# Patient Record
Sex: Female | Born: 1935 | Race: White | Hispanic: No | Smoking: Former smoker
Health system: Southern US, Community
[De-identification: ages and names within clinical notes are randomized; demographics above are authoritative.]

## PROBLEM LIST (undated history)

## (undated) DIAGNOSIS — M1990 Unspecified osteoarthritis, unspecified site: Secondary | ICD-10-CM

## (undated) DIAGNOSIS — G5600 Carpal tunnel syndrome, unspecified upper limb: Secondary | ICD-10-CM

## (undated) DIAGNOSIS — E785 Hyperlipidemia, unspecified: Secondary | ICD-10-CM

## (undated) DIAGNOSIS — J449 Chronic obstructive pulmonary disease, unspecified: Secondary | ICD-10-CM

## (undated) DIAGNOSIS — K219 Gastro-esophageal reflux disease without esophagitis: Secondary | ICD-10-CM

## (undated) DIAGNOSIS — I10 Essential (primary) hypertension: Secondary | ICD-10-CM

## (undated) HISTORY — DX: Hyperlipidemia, unspecified: E78.5

## (undated) HISTORY — DX: Chronic obstructive pulmonary disease, unspecified: J44.9

## (undated) HISTORY — PX: KNEE ARTHROSCOPY: SUR90

## (undated) HISTORY — DX: Essential (primary) hypertension: I10

## (undated) HISTORY — PX: OTHER SURGICAL HISTORY: SHX169

## (undated) HISTORY — DX: Carpal tunnel syndrome, unspecified upper limb: G56.00

## (undated) HISTORY — PX: TONSILLECTOMY: SUR1361

## (undated) HISTORY — DX: Gastro-esophageal reflux disease without esophagitis: K21.9

## (undated) HISTORY — DX: Unspecified osteoarthritis, unspecified site: M19.90

---

## 1997-12-12 ENCOUNTER — Emergency Department (HOSPITAL_COMMUNITY): Admission: RE | Admit: 1997-12-12 | Discharge: 1997-12-12 | Payer: Self-pay | Admitting: Internal Medicine

## 1998-01-19 ENCOUNTER — Encounter: Admission: RE | Admit: 1998-01-19 | Discharge: 1998-04-19 | Payer: Self-pay | Admitting: Orthopedic Surgery

## 1999-08-20 ENCOUNTER — Other Ambulatory Visit: Admission: RE | Admit: 1999-08-20 | Discharge: 1999-09-14 | Payer: Self-pay | Admitting: Internal Medicine

## 1999-09-02 ENCOUNTER — Encounter: Payer: Self-pay | Admitting: Internal Medicine

## 1999-09-02 ENCOUNTER — Encounter: Admission: RE | Admit: 1999-09-02 | Discharge: 1999-09-02 | Payer: Self-pay | Admitting: Internal Medicine

## 2002-08-15 ENCOUNTER — Other Ambulatory Visit: Admission: RE | Admit: 2002-08-15 | Discharge: 2002-08-15 | Payer: Self-pay | Admitting: Internal Medicine

## 2002-11-01 ENCOUNTER — Encounter: Payer: Self-pay | Admitting: Emergency Medicine

## 2002-11-01 ENCOUNTER — Emergency Department (HOSPITAL_COMMUNITY): Admission: EM | Admit: 2002-11-01 | Discharge: 2002-11-01 | Payer: Self-pay | Admitting: Emergency Medicine

## 2003-01-03 ENCOUNTER — Encounter: Admission: RE | Admit: 2003-01-03 | Discharge: 2003-01-03 | Payer: Self-pay | Admitting: Orthopaedic Surgery

## 2003-01-03 ENCOUNTER — Encounter: Payer: Self-pay | Admitting: Orthopaedic Surgery

## 2003-01-17 ENCOUNTER — Encounter: Payer: Self-pay | Admitting: Orthopaedic Surgery

## 2003-01-17 ENCOUNTER — Encounter: Admission: RE | Admit: 2003-01-17 | Discharge: 2003-01-17 | Payer: Self-pay | Admitting: Orthopaedic Surgery

## 2003-02-06 ENCOUNTER — Encounter: Payer: Self-pay | Admitting: Radiology

## 2003-02-06 ENCOUNTER — Encounter: Admission: RE | Admit: 2003-02-06 | Discharge: 2003-02-06 | Payer: Self-pay | Admitting: Orthopaedic Surgery

## 2003-02-06 ENCOUNTER — Encounter: Payer: Self-pay | Admitting: Orthopaedic Surgery

## 2003-07-08 ENCOUNTER — Encounter: Payer: Self-pay | Admitting: Neurosurgery

## 2003-07-10 ENCOUNTER — Inpatient Hospital Stay (HOSPITAL_COMMUNITY): Admission: RE | Admit: 2003-07-10 | Discharge: 2003-07-11 | Payer: Self-pay | Admitting: Neurosurgery

## 2003-07-10 ENCOUNTER — Encounter: Payer: Self-pay | Admitting: Neurosurgery

## 2003-07-23 ENCOUNTER — Encounter: Admission: RE | Admit: 2003-07-23 | Discharge: 2003-08-21 | Payer: Self-pay | Admitting: Obstetrics and Gynecology

## 2003-09-01 ENCOUNTER — Encounter: Admission: RE | Admit: 2003-09-01 | Discharge: 2003-11-30 | Payer: Self-pay | Admitting: Neurosurgery

## 2004-02-13 ENCOUNTER — Ambulatory Visit (HOSPITAL_COMMUNITY): Admission: RE | Admit: 2004-02-13 | Discharge: 2004-02-13 | Payer: Self-pay | Admitting: Neurosurgery

## 2004-03-30 ENCOUNTER — Ambulatory Visit (HOSPITAL_BASED_OUTPATIENT_CLINIC_OR_DEPARTMENT_OTHER): Admission: RE | Admit: 2004-03-30 | Discharge: 2004-03-30 | Payer: Self-pay | Admitting: Orthopaedic Surgery

## 2005-11-20 ENCOUNTER — Emergency Department (HOSPITAL_COMMUNITY): Admission: EM | Admit: 2005-11-20 | Discharge: 2005-11-20 | Payer: Self-pay | Admitting: Emergency Medicine

## 2008-08-04 ENCOUNTER — Ambulatory Visit: Payer: Self-pay | Admitting: Surgery

## 2008-09-22 ENCOUNTER — Ambulatory Visit: Payer: Self-pay | Admitting: Surgery

## 2008-12-15 ENCOUNTER — Ambulatory Visit: Payer: Self-pay | Admitting: Surgery

## 2009-02-03 ENCOUNTER — Encounter (INDEPENDENT_AMBULATORY_CARE_PROVIDER_SITE_OTHER): Payer: Self-pay | Admitting: *Deleted

## 2009-02-03 ENCOUNTER — Ambulatory Visit (HOSPITAL_COMMUNITY): Admission: RE | Admit: 2009-02-03 | Discharge: 2009-02-03 | Payer: Self-pay | Admitting: *Deleted

## 2009-06-08 ENCOUNTER — Ambulatory Visit: Payer: Self-pay | Admitting: Surgery

## 2009-10-30 ENCOUNTER — Inpatient Hospital Stay (HOSPITAL_COMMUNITY): Admission: EM | Admit: 2009-10-30 | Discharge: 2009-11-05 | Payer: Self-pay | Admitting: Emergency Medicine

## 2009-11-02 ENCOUNTER — Ambulatory Visit: Payer: Self-pay | Admitting: Physical Medicine & Rehabilitation

## 2009-11-05 ENCOUNTER — Inpatient Hospital Stay (HOSPITAL_COMMUNITY)
Admission: RE | Admit: 2009-11-05 | Discharge: 2009-11-14 | Payer: Self-pay | Admitting: Physical Medicine & Rehabilitation

## 2009-11-06 ENCOUNTER — Ambulatory Visit: Payer: Self-pay | Admitting: Physical Medicine & Rehabilitation

## 2010-06-14 ENCOUNTER — Ambulatory Visit: Payer: Self-pay | Admitting: Surgery

## 2010-07-20 ENCOUNTER — Ambulatory Visit (HOSPITAL_COMMUNITY): Admission: RE | Admit: 2010-07-20 | Discharge: 2010-07-20 | Payer: Self-pay | Admitting: Internal Medicine

## 2010-11-22 ENCOUNTER — Ambulatory Visit (HOSPITAL_COMMUNITY): Payer: Self-pay | Admitting: Speech Pathology

## 2011-01-02 LAB — CBC
HCT: 32.1 % — ABNORMAL LOW (ref 36.0–46.0)
HCT: 32.2 % — ABNORMAL LOW (ref 36.0–46.0)
Hemoglobin: 10.9 g/dL — ABNORMAL LOW (ref 12.0–15.0)
Hemoglobin: 12.3 g/dL (ref 12.0–15.0)
MCHC: 33.3 g/dL (ref 30.0–36.0)
MCHC: 33.6 g/dL (ref 30.0–36.0)
MCHC: 34.1 g/dL (ref 30.0–36.0)
MCHC: 34.3 g/dL (ref 30.0–36.0)
MCV: 94.1 fL (ref 78.0–100.0)
MCV: 94.4 fL (ref 78.0–100.0)
MCV: 94.1 fL (ref 78.0–100.0)
MCV: 94.4 fL (ref 78.0–100.0)
Platelets: 188 10*3/uL (ref 150–400)
Platelets: 279 10*3/uL (ref 150–400)
RBC: 3.41 MIL/uL — ABNORMAL LOW (ref 3.87–5.11)
RBC: 3.21 MIL/uL — ABNORMAL LOW (ref 3.87–5.11)
RBC: 3.42 MIL/uL — ABNORMAL LOW (ref 3.87–5.11)
RDW: 13.7 % (ref 11.5–15.5)
RDW: 13.7 % (ref 11.5–15.5)
RDW: 13.8 % (ref 11.5–15.5)
RDW: 14.2 % (ref 11.5–15.5)
WBC: 4.5 10*3/uL (ref 4.0–10.5)
WBC: 13.4 10*3/uL — ABNORMAL HIGH (ref 4.0–10.5)

## 2011-01-02 LAB — COMPREHENSIVE METABOLIC PANEL
AST: 16 U/L (ref 0–37)
Albumin: 2.6 g/dL — ABNORMAL LOW (ref 3.5–5.2)
Chloride: 101 mEq/L (ref 96–112)
Creatinine, Ser: 1.09 mg/dL (ref 0.4–1.2)
GFR calc Af Amer: 59 mL/min — ABNORMAL LOW (ref >60)
Potassium: 3.9 mEq/L (ref 3.5–5.1)
Total Bilirubin: 0.4 mg/dL (ref 0.3–1.2)
Total Protein: 5.4 g/dL — ABNORMAL LOW (ref 6.0–8.3)

## 2011-01-02 LAB — URINALYSIS, ROUTINE W REFLEX MICROSCOPIC
Bilirubin Urine: NEGATIVE
Bilirubin Urine: NEGATIVE
Glucose, UA: NEGATIVE mg/dL
Hgb urine dipstick: NEGATIVE
Ketones, ur: NEGATIVE mg/dL
Nitrite: NEGATIVE
Nitrite: POSITIVE — AB
Specific Gravity, Urine: 1.015 (ref 1.005–1.030)
Specific Gravity, Urine: 1.014 (ref 1.005–1.030)
Urobilinogen, UA: 1 mg/dL (ref 0.0–1.0)
pH: 6 (ref 5.0–8.0)

## 2011-01-02 LAB — BASIC METABOLIC PANEL
BUN: 17 mg/dL (ref 6–23)
BUN: 23 mg/dL (ref 6–23)
CO2: 27 mEq/L (ref 19–32)
CO2: 29 mEq/L (ref 19–32)
CO2: 23 mEq/L (ref 19–32)
CO2: 26 mEq/L (ref 19–32)
CO2: 27 mEq/L (ref 19–32)
Calcium: 8.2 mg/dL — ABNORMAL LOW (ref 8.4–10.5)
Calcium: 8.1 mg/dL — ABNORMAL LOW (ref 8.4–10.5)
Calcium: 8.7 mg/dL (ref 8.4–10.5)
Calcium: 8.5 mg/dL (ref 8.4–10.5)
Chloride: 100 mEq/L (ref 96–112)
Chloride: 103 mEq/L (ref 96–112)
Chloride: 104 mEq/L (ref 96–112)
Creatinine, Ser: 0.72 mg/dL (ref 0.4–1.2)
Creatinine, Ser: 0.94 mg/dL (ref 0.4–1.2)
Creatinine, Ser: 1.06 mg/dL (ref 0.4–1.2)
Creatinine, Ser: 1.11 mg/dL (ref 0.4–1.2)
GFR calc Af Amer: >60 mL/min (ref >60)
GFR calc Af Amer: 57 mL/min — ABNORMAL LOW (ref >60)
GFR calc Af Amer: >60 mL/min (ref >60)
GFR calc non Af Amer: 50 mL/min — ABNORMAL LOW (ref >60)
Glucose, Bld: 134 mg/dL — ABNORMAL HIGH (ref 70–99)
Glucose, Bld: 88 mg/dL (ref 70–99)
Glucose, Bld: 95 mg/dL (ref 70–99)
Glucose, Bld: 111 mg/dL — ABNORMAL HIGH (ref 70–99)
Potassium: 4.4 mEq/L (ref 3.5–5.1)
Sodium: 135 mEq/L (ref 135–145)
Sodium: 135 mEq/L (ref 135–145)
Sodium: 136 mEq/L (ref 135–145)

## 2011-01-02 LAB — CARDIAC PANEL(CRET KIN+CKTOT+MB+TROPI)
CK, MB: 1.9 ng/mL (ref 0.3–4.0)
CK, MB: 2.6 ng/mL (ref 0.3–4.0)
Relative Index: INVALID (ref 0.0–2.5)
Relative Index: INVALID (ref 0.0–2.5)
Total CK: 33 U/L (ref 7–177)
Total CK: 46 U/L (ref 7–177)

## 2011-01-02 LAB — DIFFERENTIAL
Basophils Absolute: 0 10*3/uL (ref 0.0–0.1)
Basophils Absolute: 0 10*3/uL (ref 0.0–0.1)
Basophils Relative: 1 % (ref 0–1)
Eosinophils Relative: 1 % (ref 0–5)
Lymphocytes Relative: 19 % (ref 12–46)
Monocytes Absolute: 0.7 10*3/uL (ref 0.1–1.0)
Monocytes Absolute: 1.5 10*3/uL — ABNORMAL HIGH (ref 0.1–1.0)
Monocytes Relative: 11 % (ref 3–12)
Neutro Abs: 3 10*3/uL (ref 1.7–7.7)
Neutro Abs: 9.1 10*3/uL — ABNORMAL HIGH (ref 1.7–7.7)
Neutrophils Relative %: 57 % (ref 43–77)

## 2011-01-02 LAB — URINE CULTURE: Colony Count: >=100000

## 2011-01-02 LAB — URINE MICROSCOPIC-ADD ON

## 2011-01-02 LAB — TSH: TSH: 0.552 u[IU]/mL (ref 0.350–4.500)

## 2011-03-01 NOTE — Assessment & Plan Note (Signed)
OFFICE VISIT   Carrie, Pena  DOB:  03-May-1928                                       06/08/2009  EAVWU#:[id number]   HISTORY:  This is a 77 year old female who I initially saw at the  request of  Dr. Aleen Campi for bilateral lower extremity  claudication. By  angiogram, she had multisegment disease.  She had an ankle brachial  index of 0.53 on the right 0.59 on the left and 0.59 on the left.  We  elected to treat her medically.  Her most recent ankle brachial index in  our office was in  March of 2010 revealing an ABI of 0.58 on the right  and 0.71 on the left.  We  had started her on cilostazol for her leg  pain.  Today she states that she is not having any issues with her legs  other than swelling.  They do not hurt her.  Unfortunately, due to her  physical limitations, she is probably just does not walking far enough  to where she has problems.  She does not have any ulcerations.  Again  her biggest complaint is leg swelling which has been a chronic problem.   PHYSICAL EXAMINATION:  Blood pressure 162/72, pulse 83.  She is well-  appearing in no distress.  She has 2+ pitting edema up to the knee.  Pedal pulses are difficult to palpate.  She has no ulceration.   ASSESSMENT/PLAN:  Bilateral peripheral vascular disease.   PLAN:  I think at this point, the patient does not require additional  treatment.  I would continue her cilostazol.  She is not walking far not  to where she suffers from arterial insufficiency,   With regard to the swelling.  I told that she may benefit from leg  compression stockings.  She is also going to look into as to whether she  would benefit from further cardiac evaluation and/or adding a diuretic.  She is supposed to  see Dr. Ronne Binning  this Monday, and she will address  that at this time.   Jorge Ny, MD  Electronically Signed   VWB/MEDQ  D:  06/08/2009  T:  06/09/2009  Job:  [id number]   cc:   Dr. Ronne Binning

## 2011-03-01 NOTE — Assessment & Plan Note (Signed)
OFFICE VISIT   Carrie, Pena  DOB:  1927/12/11                                       12/15/2008  ZOXWR#:[id number]   REASON FOR VISIT:  Followup.   HISTORY:  This is a 77 year old female who I am seeing at the request of  Dr. Aleen Campi for bilateral lower extremity claudication.  By recent  arteriogram by Dr. Aleen Campi she had multi level disease.  Her ankle  brachial index was 0.53 on the right and 0.59 on the left.  After an  extensive conversation with the patient I felt like her symptoms were  more related to orthopedic issues, specifically she has problems with  getting up from a standing position and not cramping with walking.  She  has seen Dr. Jerl Santos for evaluation of her knees and she underwent  steroid injections.  She has a history of bilateral knee replacements.   She comes back in today for followup.  I have placed her on cilostazol.  Subjectively she feels that she is doing a little better.  Her symptoms  have not changed.  She does not endorse cramping with walking but rather  her biggest complaints are pain when she tries to stand up and pain with  trying to get her left leg into the bed.  She is able to walk without  cramps.  She is limited by overall fatigue and her walking.   PHYSICAL EXAMINATION:  Her blood pressure is 188/93, pulse is 99.  In  general she is well-appearing, in no distress.  Her extremities are warm  and well-perfused.  Pedal pulses not palpable.   ASSESSMENT:  Peripheral vascular disease and leg pain.   PLAN:  Again, I am still not convinced that the patient's leg complaints  are due to arterial insufficiency.  They seem to be more arthritic in  nature as her biggest complaints are when she goes from a resting  position to a mobile position.  She does not endorse any signs of  claudication.  Her walking is really limited by overall body fatigue not  by leg cramps.  She does not endorse rest pain either.  For  these  reasons I elected to continue to follow the patient with expectant  management.  Obviously if her symptoms change or if she develops a  nonhealing ulcer or anything of that nature we would schedule her for  arteriogram and plan for intervention.  She is going to see me back in 6  months.   Jorge Ny, MD  Electronically Signed   VWB/MEDQ  D:  12/15/2008  T:  12/17/2008  Job:  [id number]   cc:   Antionette Char, MD  Thayer Headings, M.D.

## 2011-03-01 NOTE — Assessment & Plan Note (Signed)
OFFICE VISIT   Carrie Pena, Carrie Pena  DOB:  05-04-1928                                       09/22/2008  ZOXWR#:[id number]   REASON FOR VISIT:  Follow-up.   HISTORY:  This is a 77 year old female that I saw at the request of Dr.  Aleen Campi for evaluation of bilateral lower extremity claudication.  The  patient had recently undergone an arteriogram by Dr. Aleen Campi and had  multilevel disease.  She has known peripheral vascular disease with an  ABI of 0.53 on the right and 0.59 on the left.  I was not convinced her  symptoms were due to claudication and when I saw her last I put her on  Pletal.  She comes back in today for follow-up.  Again with specific  questioning of her symptoms, she states that her knees give out when she  starts to walk.  She does not ever experience cramping or pain in her  feet at night.  She feels that she has numbness in her left leg which  prevents her from even lifting her legs up.   PHYSICAL EXAMINATION:  Vital Signs:  Her blood pressure is 166/120,  pulse 86, respirations 18, temperature is 98.1.  General:  She is  comfortable, in no distress.  Extremities:  Warm and well-perfused.   ASSESSMENT/PLAN:  Peripheral vascular disease.   Plan:  Again, after the specific questioning of the patient's symptoms,  I do not think that which she complains about is related to her vascular  disease.  She does have extensive peripheral vascular disease; however,  this is not causing her symptoms.  It sounds like her knees buckle and  with her history of her orthopedic surgeries in the past and improvement  with injections she may be limited by this.  I am going to see her back  in 3 months to see how she is doing.  She plans on seeing Dr. Jerl Santos  for evaluation of her knees in the near future.  If there are any  questions, they can get in contact with me.   Jorge Ny, MD  Electronically Signed   VWB/MEDQ  D:  09/22/2008  T:   09/23/2008  Job:  [id number]   cc:   Antionette Char, MD  Thayer Headings, M.D.

## 2011-03-01 NOTE — Assessment & Plan Note (Signed)
OFFICE VISIT   Carrie Pena, Carrie Pena  DOB:  07-24-1928                                       06/14/2010  ZOXWR#:[id number]   The patient comes back in today for followup.  She continues to have  similar complaints when I saw last year.  Specifically, she does not  like the swelling that she is having in both her legs.  She is also  complaining of left knee pain.  Last time, we had recommended  compression stockings and she did not get that filled because she does  not think she can put them on.  I had also recommended that she get in  touch with her cardiologist, Dr. Aleen Campi, to evaluate her swelling and  see if it was from a cardiac perspective and this, per her report, had  not been done either.  Overall, however, she has been stable on her  chronic problems since I last saw her.   On physical examination, heart rate 79, blood pressure 189/76, O2 sats  are 99%.  GENERAL:  She is in no acute distress.  Respirations are  nonlabored.  Abdomen is soft.  Extremities reveal bilateral pitting  edema up to the knee.  No ulceration.  No rashes.   DIAGNOSTICS:  I have ordered and independently reviewed her ABIs:  0.82  on the left and 0.65 on the right.   ASSESSMENT/PLAN:  1. Leg pain.  The patient describes pain in her left knee which she      states she feels as if 2 bones are rubbing together.  She was seen      by Dr. Jerl Santos in the past.  I have recommended that she address      these issues with him.  These complaints do not seem consistent      with arterial insufficiency.  2. With regards to the patient's swelling, I have again recommended      compression therapy.  However, she is reluctant to try this because      she does not think she can get the stockings on.  I also want to      make sure that her edema is not due to underlying cardiac      insufficiency.  I do not have access to her cardiac records.  She      states that she is supposed to see Dr.  Thea Silversmith in the next month      and that she will have him address this.  We could also consider      increasing or adding a new dieretic help with her regimen.  If all      of these issues are under control, we could consider venous      ultrasound to rule out venous insufficiency.  However, I do not      feel like that is the clinical scenario at this time.  I will plan      on seeing her back in the office in 1 year.     Jorge Ny, MD  Electronically Signed   VWB/MEDQ  D:  06/14/2010  T:  06/15/2010  Job:  [id number]   cc:   Thayer Headings, M.D.

## 2011-03-01 NOTE — Assessment & Plan Note (Signed)
OFFICE VISIT   MISSOURI, LAPAGLIA  DOB:  01-08-1928                                       08/04/2008  ZOXWR#:[id number]   REASON FOR VISIT:  Claudication.   PRIMARY CARE PHYSICIAN:  Thayer Headings, MD.   HISTORY:  This is a 77 year old female I am seeing at the request of Dr.  Aleen Campi for evaluation of bilateral claudication.  The patient recently  had an arteriogram, which reveals significant bilateral disease.  The  patient states that she has pain in her legs with walking.  However,  upon specific questioning she does not really endorse claudication-like  symptoms.  She states that her walking is limited mainly by her lack of  balance.  She also describes pain in her thighs when she wakes up in the  morning that gets better with walking.  She states that she does have  pain in her knees, which limits her walking, as well as pain in her  ankles, which limits her walking.  She does not endorse having any form  of a cramp with walking and she does not wake up in the middle of the  night with pain in her feet.   REVIEW OF SYSTEMS:  Negative for fevers, chills, weight gain, weight  loss.  CARDIOVASCULAR:  Negative for chest pain or palpitations.  RESPIRATORY:  Negative for shortness of breath.  GI:  Negative.  GU:  Negative.  NEUROLOGIC:  No focal changes.  MUSCULOSKELETAL:  Positive for arthritis.  SKIN:  Negative.  PSYCH:  Negative.   PAST MEDICAL HISTORY:  Osteoarthritis, carpal tunnel syndrome,  hypertension, reflux disease, hyperlipidemia.   PAST SURGICAL HISTORY:  Tonsillectomy, bilateral knee arthroscopy, left  carpal tunnel release.   SOCIAL HISTORY:  She is widowed with 4 children.  Currently smokes half  pack a day.  Does not drink.   MEDICATIONS:  Vitamin D, vitamin B12, low-dose aspirin, simvastatin,  naproxen.   ALLERGIES:  Tylenol.   PHYSICAL EXAMINATION:  Her blood pressure is 200/114, she is in no acute  distress.  She is  well-appearing.  HEENT:  Normocephalic, atraumatic.  Pupils are equal.  Sclerae are  anicteric.  Cardiovascular:  Regular rate and rhythm, respirations are  nonlabored.  Abdomen:  Soft, nontender.  Extremities:  Warm and well-  perfused.  Pedal pulses are not palpable.  There are no ulcerations.  Skin:  Without rash.  Neuro:  Cranial nerves II-XII are grossly intact.  Psych:  She is alert and oriented x3.   DIAGNOSTIC STUDIES:  I have reviewed the arteriogram that was performed  by Dr. Aleen Campi.   ASSESSMENT/PLAN:  Peripheral vascular disease.   PLAN:  I had a long conversation with the patient and wife and I do not  feel at this time that her lower extremity symptoms are related to  arterial insufficiency.  I have given her a prescription for Pletal to  see if she does receive any benefit.  I have told her to really pay  attention to her symptoms so that she can probably relate them to me at  our next visit, as of right now I do not feel like they sound like  arterial insufficiency.  She does have an ulcer on the medial side of  her left ankle from trauma,.  She states that she has had these in the  past and they have all healed, we will continue to monitor this.  I am  going to have her follow up with me in 6 weeks to reassess her symptoms  and to see if the medication had any benefit.   Jorge Ny, MD  Electronically Signed   VWB/MEDQ  D:  08/04/2008  T:  08/05/2008  Job:  [id number]   cc:   Antionette Char, MD  Thayer Headings, M.D.

## 2011-03-01 NOTE — Op Note (Signed)
NAMEKRISTIN, [name] NO.:  [id number]   MEDICAL RECORD NO.:  [id number]          PATIENT TYPE:  AMB   LOCATION:  ENDO                         FACILITY:  [facility]   PHYSICIAN:  Georgiana Spinner, M.D.    DATE OF BIRTH:  [birth year]   DATE OF PROCEDURE:  02/03/2009  DATE OF DISCHARGE:                               OPERATIVE REPORT   PROCEDURE:  Colonoscopy.   INDICATIONS:  Colon cancer screening, constipation.   ANESTHESIA:  Fentanyl 40 mcg, Versed 3 mg.   DESCRIPTION OF PROCEDURE:  With the patient mildly sedated in the left  lateral decubitus position and with subsequently being rolled to her  back the Pentax videoscopic pediatric colonoscope was inserted in the  rectum, passed under direct vision with pressure applied to reach the  cecum identified by ileocecal valve and appendiceal orifice both of  which were photographed.  From this point the colonoscope was slowly  withdrawn taking circumferential views of colonic mucosa as we withdrew  all the way to the rectum stopping first in the cecum where a polyp was  seen, photographed and removed using hot biopsy forceps technique  setting of 20/150 blended current.  We next stopped at the hepatic  flexure where a second polyp was seen.  It too was photographed and it  was removed using hot snare technique with the same setting and was  retrieved by suctioning the polyp through the endoscope into a tissue  trap.  The endoscope was then withdrawn all the way to the rectum which  appeared normal on direct and showed hemorrhoids on retroflexed view.  The endoscope was straightened and withdrawn.  The patient's vital signs  and pulse oximeter remained stable.  The patient tolerated the procedure  well without apparent complication.   FINDINGS:  1. Polyp of cecum and hepatic flexure area.  2. There is some scattered diverticula.  3. Internal hemorrhoids.   PLAN:  Await biopsy report.  The patient will call me for results  and  follow-up with me as an outpatient.           ______________________________  Georgiana Spinner, M.D.     GMO/MEDQ  D:  02/03/2009  T:  02/03/2009  Job:  [id number]

## 2011-03-04 NOTE — Op Note (Signed)
NAMESHELLEY, COCKE                         ACCOUNT NO.:  [id number]   MEDICAL RECORD NO.:  [id number]                   PATIENT TYPE:  AMB   LOCATION:  DSC                                  FACILITY:  [facility]   PHYSICIAN:  Lubertha Basque. Jerl Santos, M.D.             DATE OF BIRTH:  Mar 05, 1928   DATE OF PROCEDURE:  03/30/2004  DATE OF DISCHARGE:                                 OPERATIVE REPORT   PREOPERATIVE DIAGNOSES:  1. Right knee degenerative joint disease.  2. Right knee torn medial meniscus.   POSTOPERATIVE DIAGNOSES:  1. Right knee degenerative joint disease.  2. Right knee torn lateral meniscus.   OPERATION PERFORMED:  1. Right knee removal of loose body and chondroplasty.  2. Right knee partial lateral meniscectomy.   SURGEON:  Lubertha Basque. Jerl Santos, M.D.   ASSISTANT:  Prince Rome, P.A.   ANESTHESIA:  General.   INDICATIONS FOR PROCEDURE:  The patient is a 77 year old woman with a long  history of right knee pain.  This has persisted despite various oral anti-  inflammatories and injectables.  Carrie Pena continues with pain at rest and pain  with activity which is limiting to Carrie Pena.  Carrie Pena is offered an arthroscopy.  Informed operative consent was obtained after discussion of possible  complications of reaction to anesthesia and infection.   DESCRIPTION OF PROCEDURE:  The patient was taken to the operating suite  where general anesthetic was applied without difficulty.  Carrie Pena was positioned  supine and prepped and draped in the normal sterile fashion.  After  administration of preop intravenous antibiotics, an arthroscopy of the right  knee was performed through two inferior portals.  Suprapatellar pouch was  benign while the patellofemoral joint exhibited some grade 2 chondromalacia  though this joint tracked well.  The medial compartment had no evidence of  meniscal or articular cartilage injury.  Carrie Pena did have some cartilaginous  loose bodies in this compartment which were  removed.  The largest measured  about 4 mm or 5 mm in diameter.  The anterior cruciate ligament appeared  intact.  The lateral compartment exhibited a degenerative tear of the middle  horn addressed with 10% partial lateral meniscectomy.  Carrie Pena had one small  dime-sized area of degeneration, grade 3 on the lateral tibial plateau which  I addressed with chondroplasty back to stable articular cartilage.  The knee  was irrigated at the end of the case and injected with the usual agents plus  Depo-Medrol.  Adaptic was placed over the portals followed by dry gauze and  a loose Ace wrap.  Estimated blood loss and intraoperative fluids can be obtained from  anesthesia records.   DISPOSITION:  The patient was extubated in the operating room and taken to  the recovery room in stable condition.  Plans were for the patient to go  home the same day and to follow up in the office in less than a  week.  I  will contact Carrie Pena by phone tonight.                                               Lubertha Basque Jerl Santos, M.D.    PGD/MEDQ  D:  03/30/2004  T:  03/30/2004  Job:  [id number]

## 2011-03-04 NOTE — Op Note (Signed)
NAME:  Carrie Pena, Carrie Pena                         ACCOUNT NO.:  [id number]   MEDICAL RECORD NO.:  [id number]                   PATIENT TYPE:  INP   LOCATION:  NA                                   FACILITY:  [facility]   PHYSICIAN:  Hewitt Shorts, M.D.            DATE OF BIRTH:  09/22/1928   DATE OF PROCEDURE:  07/10/2003  DATE OF DISCHARGE:                                 OPERATIVE REPORT   PREOPERATIVE DIAGNOSES:  Lumbar stenosis, lumbar spondylosis, lumbar  degenerative disk disease, and lumbar radiculopathy.   POSTOPERATIVE DIAGNOSES:  Lumbar stenosis, lumbar spondylosis, lumbar  degenerative disk disease, and lumbar radiculopathy.   PROCEDURE:  L3 to S1 decompressive lumbar laminectomy with microdissection.   SURGEON:  Hewitt Shorts, M.D.   ASSISTANT:  Danae Orleans. Venetia Maxon, M.D.   ANESTHESIA:  General endotracheal.   INDICATIONS:  The patient is a 77 year old woman who presented with  neurogenic claudication with particular pain running down through the right  lower extremity with weakness of her dorsiflexors and extensor hallucis  longus bilaterally, worse than the weakness of her plantar flexion  bilaterally.  MRI scan revealed severe lumbar stenosis at the L4-5 level,  marked spinal stenosis at the L5-S1 level, and moderate spinal stenosis at  the L3-4 level.  The decision was made to proceed with a multilevel  decompressive lumbar laminectomy.   DESCRIPTION OF PROCEDURE:  The patient was brought to the operating room and  placed under general endotracheal anesthesia.  The patient was turned to a  prone position and the lumbar region was prepped with Betadine soap and  solution and draped in a sterile fashion.  The midline was infiltrated with  local anesthetic with epinephrine.  An x-ray was taken to localize the L3  through S1 levels, then a midline incision was made, carried down through  the subcutaneous tissue with bipolar cautery and electrocautery used to  maintain hemostasis.  Dissection was carried down to the lumbar fascia,  which was incised bilaterally, and the paraspinal muscles were dissected  from the spinous processes and laminae in a subperiosteal fashion.  Another  x-ray was taken and the L3, L4, L5, and S1 spinous processes and laminae  were identified, and then we proceeded with a laminectomy using double-  action rongeurs, the [facility] Max drill, and Kerrison punches.  The microscope  was draped and brought into the field to provide additional magnification,  illumination, and visualization, and the decompression of the spinal canal  was performed using microdissection and microsurgical technique.  There was  marked thickening of the ligamentum flavum, moderate adhesions between the  ligamentum and flavum and the dura, which I suspect was due to previous  injections of epidural steroids.  These were carefully dissected.  We  dissected into the lateral recesses and removed thickened ligament tissue  that compressed the thecal sac and nerve root laterally on either side.  In  the  end good decompression of the spinal canal, thecal sac, and nerve roots  was achieved bilaterally from L3 to S1.  Once the decompression was  completed, edges of the bone were waxed as necessary.  Gelfoam soaked in  thrombin was placed in the laminectomy defect, hemostasis established.  We  irrigated the wound extensively throughout the procedure with saline and  then subsequently with bacitracin solution, and then we proceeded with  closure.  The paraspinal muscles were approximated with interrupted, undyed  1 Vicryl sutures, the deep fascia was closed with interrupted, undyed 1  Vicryl sutures, the subcutaneous and subcuticular layer were closed with  interrupted, inverted 2-0 undyed Vicryl sutures, and the skin was  reapproximated with Dermabond.  The patient tolerated the procedure well.  The estimated blood loss was 100 mL.  Sponge and needle count were  correct.  Following the surgery the patient was turned back to the supine position to  be reversed from his anesthetic, extubated, and transferred to the recovery  room for further care.                                               Hewitt Shorts, M.D.    RWN/MEDQ  D:  07/10/2003  T:  07/11/2003  Job:  [id number]

## 2011-03-23 ENCOUNTER — Emergency Department (HOSPITAL_COMMUNITY): Payer: Medicare Other

## 2011-03-23 ENCOUNTER — Emergency Department (HOSPITAL_COMMUNITY)
Admission: EM | Admit: 2011-03-23 | Discharge: 2011-03-23 | Disposition: A | Discharge status: Home / Self Care | Payer: Medicare Other | Attending: Emergency Medicine | Admitting: Emergency Medicine

## 2011-03-23 DIAGNOSIS — S92909A Unspecified fracture of unspecified foot, initial encounter for closed fracture: Secondary | ICD-10-CM | POA: Insufficient documentation

## 2011-03-23 DIAGNOSIS — M129 Arthropathy, unspecified: Secondary | ICD-10-CM | POA: Insufficient documentation

## 2011-03-23 DIAGNOSIS — W19XXXA Unspecified fall, initial encounter: Secondary | ICD-10-CM | POA: Insufficient documentation

## 2011-03-23 DIAGNOSIS — S82899A Other fracture of unspecified lower leg, initial encounter for closed fracture: Secondary | ICD-10-CM | POA: Insufficient documentation

## 2011-03-23 DIAGNOSIS — Y92009 Unspecified place in unspecified non-institutional (private) residence as the place of occurrence of the external cause: Secondary | ICD-10-CM | POA: Insufficient documentation

## 2011-03-23 DIAGNOSIS — I10 Essential (primary) hypertension: Secondary | ICD-10-CM | POA: Insufficient documentation

## 2011-05-12 ENCOUNTER — Encounter: Payer: Self-pay | Admitting: Surgery

## 2011-06-06 ENCOUNTER — Encounter: Payer: Self-pay | Admitting: Surgery

## 2011-06-09 ENCOUNTER — Encounter: Payer: Self-pay | Admitting: Surgery

## 2011-06-30 ENCOUNTER — Other Ambulatory Visit: Payer: Self-pay | Admitting: *Deleted

## 2011-06-30 DIAGNOSIS — I739 Peripheral vascular disease, unspecified: Secondary | ICD-10-CM

## 2011-06-30 MED ORDER — CILOSTAZOL 100 MG PO TABS: 100.0000 mg | ORAL_TABLET | Freq: Two times a day (BID) | ORAL | Status: DC

## 2011-07-15 ENCOUNTER — Encounter: Payer: Self-pay | Admitting: Surgery

## 2011-07-18 ENCOUNTER — Ambulatory Visit (INDEPENDENT_AMBULATORY_CARE_PROVIDER_SITE_OTHER): Payer: Medicare Other | Admitting: Surgery

## 2011-07-18 ENCOUNTER — Encounter: Payer: Self-pay | Admitting: Surgery

## 2011-07-18 ENCOUNTER — Encounter (INDEPENDENT_AMBULATORY_CARE_PROVIDER_SITE_OTHER): Payer: Medicare Other | Admitting: *Deleted

## 2011-07-18 VITALS — BP 184/80 | HR 75 | Resp 16 | Ht 63.0 in | Wt 145.0 lb

## 2011-07-18 DIAGNOSIS — I70219 Atherosclerosis of native arteries of extremities with intermittent claudication, unspecified extremity: Secondary | ICD-10-CM

## 2011-07-18 DIAGNOSIS — I739 Peripheral vascular disease, unspecified: Secondary | ICD-10-CM

## 2011-07-18 NOTE — Progress Notes (Signed)
[facility] [place]   Patient name: Carrie Pena MRN: [id number] DOB: [birth year] Sex: female     Chief Complaint  Patient presents with  . PVD    1 year follow up with vascular labs today    HISTORY OF PRESENT ILLNESS: The patient returns today for followup. She was initially seen with bilateral leg pain having undergone an arteriogram which revealed bilateral disease. After specific questioning it did not feel like her symptoms were really from arterial insufficiency. Initially she was complaining of her lack of balance as well as thigh pain when she woke up in the morning. As well also, she was having severe knee pain. I have been following her expectantly. I have tried to get her in a compression stockings for her swelling however she is unable to put these on. Again, her symptoms have never been consistent with arterial insufficiency. She denies calf cramping with exercise. Her symptoms today are no different.  Past Medical History  Diagnosis Date  . Hypertension   . GERD (gastroesophageal reflux disease)   . Carpal tunnel syndrome   . Hyperlipidemia   . Osteoarthritis     Past Surgical History  Procedure Date  . Tonsillectomy   . Knee arthroscopy   . Left carpal tunnel     History   Social History  . Marital Status: Widowed    Spouse Name: N/A    Number of Children: N/A  . Years of Education: N/A   Occupational History  . Not on file.   Social History Main Topics  . Smoking status: Former Smoker -- 0.5 packs/day    Types: Cigarettes    Quit date: 09/16/2009  . Smokeless tobacco: Not on file  . Alcohol Use: No  . Drug Use:   . Sexually Active:    Other Topics Concern  . Not on file   Social History Narrative  . No narrative on file    No family history on file.  Allergies as of 07/18/2011 - Review Complete 07/18/2011  Allergen Reaction Noted  . Tylenol (acetaminophen) Hives 05/12/2011    Current Outpatient Prescriptions on  File Prior to Visit  Medication Sig Dispense Refill  . aspirin 81 MG tablet Take 81 mg by mouth daily.        . cilostazol (PLETAL) 100 MG tablet Take 1 tablet (100 mg total) by mouth 2 (two) times daily.  60 tablet  11  . cyanocobalamin 500 MCG tablet Take 1,000 mcg by mouth daily.        . ergocalciferol (VITAMIN D2) 50000 UNITS capsule Take 10,000 Units by mouth once a week.        Marland Kitchen lisinopril-hydrochlorothiazide (PRINZIDE,ZESTORETIC) 20-12.5 MG per tablet Take 1 tablet by mouth daily.        . meloxicam (MOBIC) 15 MG tablet Take 15 mg by mouth daily.        . multivitamin (THERAGRAN) per tablet Take 1 tablet by mouth daily.        . simvastatin (ZOCOR) 10 MG tablet Take 10 mg by mouth at bedtime.        . vitamin B-12 (CYANOCOBALAMIN) 1000 MCG tablet Take 1,000 mcg by mouth daily.           REVIEW OF SYSTEMS: No changes  PHYSICAL EXAMINATION: General: The patient appears their stated age.  Vital signs are BP 184/80  Pulse 75  Resp 16  Ht 5\' 3"  (1.6 m)  Wt 145 lb (65.772 kg)  BMI  25.69 kg/m2  SpO2 97% Pulmonary: There is a good air exchange bilaterally without wheezing or rales. Musculoskeletal: There are no major deformities.  There is no significant extremity pain. Neurologic: No focal weakness or paresthesias are detected, Skin: There are no ulcer or rashes noted. Psychiatric: The patient has normal affect. Cardiovascular: There is a regular rate and rhythm without significant murmur appreciated. Pedal pulses are nonpalpable   Diagnostic Studies ABI on the right is 0.57 the left is 0.76  Assessment: Bilateral leg pain Plan: Again I do not feel like the symptoms she endorses her to to arterial insufficiency. I discussed with her the significance of checking her feet on a daily basis to make sure that there are no open wounds and if she develops a nonhealing wound to contact me as soon as possible. Also refer her to a podiatrist for toenail care.  With regards to her  swelling encouraged to keep her legs elevated when possible she cannot wear compression stockings.  She'll followup when necessary basis  V. Charlena Cross, M.D. [facility] [place] Office: [phone]

## 2011-08-19 NOTE — Progress Notes (Signed)
This encounter was created in error - please disregard.

## 2012-01-17 DIAGNOSIS — M1710 Unilateral primary osteoarthritis, unspecified knee: Secondary | ICD-10-CM | POA: Diagnosis not present

## 2012-01-17 DIAGNOSIS — M19049 Primary osteoarthritis, unspecified hand: Secondary | ICD-10-CM | POA: Diagnosis not present

## 2012-02-07 DIAGNOSIS — E559 Vitamin D deficiency, unspecified: Secondary | ICD-10-CM | POA: Diagnosis not present

## 2012-02-07 DIAGNOSIS — I10 Essential (primary) hypertension: Secondary | ICD-10-CM | POA: Diagnosis not present

## 2012-02-14 DIAGNOSIS — I75029 Atheroembolism of unspecified lower extremity: Secondary | ICD-10-CM | POA: Diagnosis not present

## 2012-02-14 DIAGNOSIS — E785 Hyperlipidemia, unspecified: Secondary | ICD-10-CM | POA: Diagnosis not present

## 2012-02-14 DIAGNOSIS — I10 Essential (primary) hypertension: Secondary | ICD-10-CM | POA: Diagnosis not present

## 2012-02-14 DIAGNOSIS — J438 Other emphysema: Secondary | ICD-10-CM | POA: Diagnosis not present

## 2012-04-16 DIAGNOSIS — R609 Edema, unspecified: Secondary | ICD-10-CM | POA: Diagnosis not present

## 2012-04-20 DIAGNOSIS — R609 Edema, unspecified: Secondary | ICD-10-CM | POA: Diagnosis not present

## 2012-07-13 DIAGNOSIS — M25539 Pain in unspecified wrist: Secondary | ICD-10-CM | POA: Diagnosis not present

## 2012-07-13 DIAGNOSIS — M1710 Unilateral primary osteoarthritis, unspecified knee: Secondary | ICD-10-CM | POA: Diagnosis not present

## 2012-07-26 DIAGNOSIS — Z961 Presence of intraocular lens: Secondary | ICD-10-CM | POA: Diagnosis not present

## 2012-07-26 DIAGNOSIS — H52209 Unspecified astigmatism, unspecified eye: Secondary | ICD-10-CM | POA: Diagnosis not present

## 2012-07-26 DIAGNOSIS — H5700 Unspecified anomaly of pupillary function: Secondary | ICD-10-CM | POA: Diagnosis not present

## 2012-07-26 DIAGNOSIS — H02829 Cysts of unspecified eye, unspecified eyelid: Secondary | ICD-10-CM | POA: Diagnosis not present

## 2012-08-07 DIAGNOSIS — E559 Vitamin D deficiency, unspecified: Secondary | ICD-10-CM | POA: Diagnosis not present

## 2012-08-07 DIAGNOSIS — E785 Hyperlipidemia, unspecified: Secondary | ICD-10-CM | POA: Diagnosis not present

## 2012-08-07 DIAGNOSIS — Z0000 Encounter for general adult medical examination without abnormal findings: Secondary | ICD-10-CM | POA: Diagnosis not present

## 2012-08-07 DIAGNOSIS — M810 Age-related osteoporosis without current pathological fracture: Secondary | ICD-10-CM | POA: Diagnosis not present

## 2012-08-07 DIAGNOSIS — I10 Essential (primary) hypertension: Secondary | ICD-10-CM | POA: Diagnosis not present

## 2012-08-07 DIAGNOSIS — E538 Deficiency of other specified B group vitamins: Secondary | ICD-10-CM | POA: Diagnosis not present

## 2012-08-14 DIAGNOSIS — E538 Deficiency of other specified B group vitamins: Secondary | ICD-10-CM | POA: Diagnosis not present

## 2012-08-14 DIAGNOSIS — J438 Other emphysema: Secondary | ICD-10-CM | POA: Diagnosis not present

## 2012-08-14 DIAGNOSIS — I75029 Atheroembolism of unspecified lower extremity: Secondary | ICD-10-CM | POA: Diagnosis not present

## 2012-08-14 DIAGNOSIS — I10 Essential (primary) hypertension: Secondary | ICD-10-CM | POA: Diagnosis not present

## 2013-01-08 ENCOUNTER — Other Ambulatory Visit: Payer: Self-pay | Admitting: *Deleted

## 2013-01-08 DIAGNOSIS — I739 Peripheral vascular disease, unspecified: Secondary | ICD-10-CM

## 2013-01-08 MED ORDER — CILOSTAZOL 100 MG PO TABS: 100.0000 mg | ORAL_TABLET | Freq: Two times a day (BID) | ORAL | Status: DC

## 2013-01-09 ENCOUNTER — Other Ambulatory Visit: Payer: Self-pay | Admitting: *Deleted

## 2013-01-09 ENCOUNTER — Other Ambulatory Visit: Payer: Self-pay | Admitting: Surgery

## 2013-01-14 ENCOUNTER — Other Ambulatory Visit: Payer: Self-pay | Admitting: Surgery

## 2013-02-04 DIAGNOSIS — E785 Hyperlipidemia, unspecified: Secondary | ICD-10-CM | POA: Diagnosis not present

## 2013-02-04 DIAGNOSIS — M810 Age-related osteoporosis without current pathological fracture: Secondary | ICD-10-CM | POA: Diagnosis not present

## 2013-02-04 DIAGNOSIS — I10 Essential (primary) hypertension: Secondary | ICD-10-CM | POA: Diagnosis not present

## 2013-02-04 DIAGNOSIS — E538 Deficiency of other specified B group vitamins: Secondary | ICD-10-CM | POA: Diagnosis not present

## 2013-02-11 DIAGNOSIS — M810 Age-related osteoporosis without current pathological fracture: Secondary | ICD-10-CM | POA: Diagnosis not present

## 2013-02-11 DIAGNOSIS — I75029 Atheroembolism of unspecified lower extremity: Secondary | ICD-10-CM | POA: Diagnosis not present

## 2013-02-11 DIAGNOSIS — I10 Essential (primary) hypertension: Secondary | ICD-10-CM | POA: Diagnosis not present

## 2013-02-11 DIAGNOSIS — E785 Hyperlipidemia, unspecified: Secondary | ICD-10-CM | POA: Diagnosis not present

## 2013-03-06 DIAGNOSIS — M1710 Unilateral primary osteoarthritis, unspecified knee: Secondary | ICD-10-CM | POA: Diagnosis not present

## 2013-05-06 DIAGNOSIS — L84 Corns and callosities: Secondary | ICD-10-CM | POA: Diagnosis not present

## 2013-05-06 DIAGNOSIS — M79609 Pain in unspecified limb: Secondary | ICD-10-CM | POA: Diagnosis not present

## 2013-05-06 DIAGNOSIS — B351 Tinea unguium: Secondary | ICD-10-CM | POA: Diagnosis not present

## 2013-07-05 DIAGNOSIS — M1710 Unilateral primary osteoarthritis, unspecified knee: Secondary | ICD-10-CM | POA: Diagnosis not present

## 2013-07-29 DIAGNOSIS — R609 Edema, unspecified: Secondary | ICD-10-CM | POA: Diagnosis not present

## 2013-07-29 DIAGNOSIS — I10 Essential (primary) hypertension: Secondary | ICD-10-CM | POA: Diagnosis not present

## 2013-07-29 DIAGNOSIS — Z1331 Encounter for screening for depression: Secondary | ICD-10-CM | POA: Diagnosis not present

## 2013-07-29 DIAGNOSIS — Z0000 Encounter for general adult medical examination without abnormal findings: Secondary | ICD-10-CM | POA: Diagnosis not present

## 2013-07-29 DIAGNOSIS — E785 Hyperlipidemia, unspecified: Secondary | ICD-10-CM | POA: Diagnosis not present

## 2013-07-29 DIAGNOSIS — E538 Deficiency of other specified B group vitamins: Secondary | ICD-10-CM | POA: Diagnosis not present

## 2013-07-29 DIAGNOSIS — E663 Overweight: Secondary | ICD-10-CM | POA: Diagnosis not present

## 2013-07-29 DIAGNOSIS — M810 Age-related osteoporosis without current pathological fracture: Secondary | ICD-10-CM | POA: Diagnosis not present

## 2013-07-31 ENCOUNTER — Ambulatory Visit: Payer: Self-pay | Admitting: Podiatry

## 2013-08-05 DIAGNOSIS — M810 Age-related osteoporosis without current pathological fracture: Secondary | ICD-10-CM | POA: Diagnosis not present

## 2013-08-05 DIAGNOSIS — I75029 Atheroembolism of unspecified lower extremity: Secondary | ICD-10-CM | POA: Diagnosis not present

## 2013-08-05 DIAGNOSIS — E785 Hyperlipidemia, unspecified: Secondary | ICD-10-CM | POA: Diagnosis not present

## 2013-08-05 DIAGNOSIS — I10 Essential (primary) hypertension: Secondary | ICD-10-CM | POA: Diagnosis not present

## 2013-09-20 DIAGNOSIS — M1990 Unspecified osteoarthritis, unspecified site: Secondary | ICD-10-CM | POA: Diagnosis not present

## 2013-09-20 DIAGNOSIS — I75029 Atheroembolism of unspecified lower extremity: Secondary | ICD-10-CM | POA: Diagnosis not present

## 2013-09-27 DIAGNOSIS — M1710 Unilateral primary osteoarthritis, unspecified knee: Secondary | ICD-10-CM | POA: Diagnosis not present

## 2013-11-04 DIAGNOSIS — I739 Peripheral vascular disease, unspecified: Secondary | ICD-10-CM | POA: Diagnosis not present

## 2013-11-04 DIAGNOSIS — M1710 Unilateral primary osteoarthritis, unspecified knee: Secondary | ICD-10-CM | POA: Diagnosis not present

## 2013-11-04 DIAGNOSIS — Z9181 History of falling: Secondary | ICD-10-CM | POA: Diagnosis not present

## 2013-11-04 DIAGNOSIS — IMO0001 Reserved for inherently not codable concepts without codable children: Secondary | ICD-10-CM | POA: Diagnosis not present

## 2013-11-04 DIAGNOSIS — M6281 Muscle weakness (generalized): Secondary | ICD-10-CM | POA: Diagnosis not present

## 2013-11-04 DIAGNOSIS — R262 Difficulty in walking, not elsewhere classified: Secondary | ICD-10-CM | POA: Diagnosis not present

## 2013-11-04 DIAGNOSIS — IMO0002 Reserved for concepts with insufficient information to code with codable children: Secondary | ICD-10-CM | POA: Diagnosis not present

## 2013-11-04 DIAGNOSIS — I10 Essential (primary) hypertension: Secondary | ICD-10-CM | POA: Diagnosis not present

## 2013-11-07 DIAGNOSIS — M1710 Unilateral primary osteoarthritis, unspecified knee: Secondary | ICD-10-CM | POA: Diagnosis not present

## 2013-11-07 DIAGNOSIS — M6281 Muscle weakness (generalized): Secondary | ICD-10-CM | POA: Diagnosis not present

## 2013-11-07 DIAGNOSIS — I10 Essential (primary) hypertension: Secondary | ICD-10-CM | POA: Diagnosis not present

## 2013-11-07 DIAGNOSIS — I739 Peripheral vascular disease, unspecified: Secondary | ICD-10-CM | POA: Diagnosis not present

## 2013-11-07 DIAGNOSIS — IMO0001 Reserved for inherently not codable concepts without codable children: Secondary | ICD-10-CM | POA: Diagnosis not present

## 2013-11-07 DIAGNOSIS — R262 Difficulty in walking, not elsewhere classified: Secondary | ICD-10-CM | POA: Diagnosis not present

## 2013-11-07 DIAGNOSIS — IMO0002 Reserved for concepts with insufficient information to code with codable children: Secondary | ICD-10-CM | POA: Diagnosis not present

## 2013-11-12 DIAGNOSIS — IMO0001 Reserved for inherently not codable concepts without codable children: Secondary | ICD-10-CM | POA: Diagnosis not present

## 2013-11-12 DIAGNOSIS — I739 Peripheral vascular disease, unspecified: Secondary | ICD-10-CM | POA: Diagnosis not present

## 2013-11-12 DIAGNOSIS — I10 Essential (primary) hypertension: Secondary | ICD-10-CM | POA: Diagnosis not present

## 2013-11-12 DIAGNOSIS — IMO0002 Reserved for concepts with insufficient information to code with codable children: Secondary | ICD-10-CM | POA: Diagnosis not present

## 2013-11-12 DIAGNOSIS — M6281 Muscle weakness (generalized): Secondary | ICD-10-CM | POA: Diagnosis not present

## 2013-11-12 DIAGNOSIS — R262 Difficulty in walking, not elsewhere classified: Secondary | ICD-10-CM | POA: Diagnosis not present

## 2013-11-12 DIAGNOSIS — M1710 Unilateral primary osteoarthritis, unspecified knee: Secondary | ICD-10-CM | POA: Diagnosis not present

## 2013-11-14 DIAGNOSIS — IMO0002 Reserved for concepts with insufficient information to code with codable children: Secondary | ICD-10-CM | POA: Diagnosis not present

## 2013-11-14 DIAGNOSIS — M1710 Unilateral primary osteoarthritis, unspecified knee: Secondary | ICD-10-CM | POA: Diagnosis not present

## 2013-11-14 DIAGNOSIS — M6281 Muscle weakness (generalized): Secondary | ICD-10-CM | POA: Diagnosis not present

## 2013-11-14 DIAGNOSIS — R262 Difficulty in walking, not elsewhere classified: Secondary | ICD-10-CM | POA: Diagnosis not present

## 2013-11-14 DIAGNOSIS — IMO0001 Reserved for inherently not codable concepts without codable children: Secondary | ICD-10-CM | POA: Diagnosis not present

## 2013-11-16 DIAGNOSIS — R262 Difficulty in walking, not elsewhere classified: Secondary | ICD-10-CM | POA: Diagnosis not present

## 2013-11-16 DIAGNOSIS — IMO0001 Reserved for inherently not codable concepts without codable children: Secondary | ICD-10-CM | POA: Diagnosis not present

## 2013-11-16 DIAGNOSIS — M1710 Unilateral primary osteoarthritis, unspecified knee: Secondary | ICD-10-CM | POA: Diagnosis not present

## 2013-11-16 DIAGNOSIS — IMO0002 Reserved for concepts with insufficient information to code with codable children: Secondary | ICD-10-CM | POA: Diagnosis not present

## 2013-11-16 DIAGNOSIS — M6281 Muscle weakness (generalized): Secondary | ICD-10-CM | POA: Diagnosis not present

## 2013-11-16 DIAGNOSIS — I10 Essential (primary) hypertension: Secondary | ICD-10-CM | POA: Diagnosis not present

## 2013-11-16 DIAGNOSIS — I739 Peripheral vascular disease, unspecified: Secondary | ICD-10-CM | POA: Diagnosis not present

## 2013-11-19 DIAGNOSIS — IMO0001 Reserved for inherently not codable concepts without codable children: Secondary | ICD-10-CM | POA: Diagnosis not present

## 2013-11-19 DIAGNOSIS — IMO0002 Reserved for concepts with insufficient information to code with codable children: Secondary | ICD-10-CM | POA: Diagnosis not present

## 2013-11-19 DIAGNOSIS — R262 Difficulty in walking, not elsewhere classified: Secondary | ICD-10-CM | POA: Diagnosis not present

## 2013-11-19 DIAGNOSIS — M1710 Unilateral primary osteoarthritis, unspecified knee: Secondary | ICD-10-CM | POA: Diagnosis not present

## 2013-11-19 DIAGNOSIS — I739 Peripheral vascular disease, unspecified: Secondary | ICD-10-CM | POA: Diagnosis not present

## 2013-11-19 DIAGNOSIS — I10 Essential (primary) hypertension: Secondary | ICD-10-CM | POA: Diagnosis not present

## 2013-11-19 DIAGNOSIS — M6281 Muscle weakness (generalized): Secondary | ICD-10-CM | POA: Diagnosis not present

## 2013-11-22 DIAGNOSIS — IMO0001 Reserved for inherently not codable concepts without codable children: Secondary | ICD-10-CM | POA: Diagnosis not present

## 2013-11-22 DIAGNOSIS — M1710 Unilateral primary osteoarthritis, unspecified knee: Secondary | ICD-10-CM | POA: Diagnosis not present

## 2013-11-22 DIAGNOSIS — R262 Difficulty in walking, not elsewhere classified: Secondary | ICD-10-CM | POA: Diagnosis not present

## 2013-11-22 DIAGNOSIS — IMO0002 Reserved for concepts with insufficient information to code with codable children: Secondary | ICD-10-CM | POA: Diagnosis not present

## 2013-11-22 DIAGNOSIS — I10 Essential (primary) hypertension: Secondary | ICD-10-CM | POA: Diagnosis not present

## 2013-11-22 DIAGNOSIS — I739 Peripheral vascular disease, unspecified: Secondary | ICD-10-CM | POA: Diagnosis not present

## 2013-11-22 DIAGNOSIS — M6281 Muscle weakness (generalized): Secondary | ICD-10-CM | POA: Diagnosis not present

## 2013-11-27 DIAGNOSIS — M1710 Unilateral primary osteoarthritis, unspecified knee: Secondary | ICD-10-CM | POA: Diagnosis not present

## 2013-11-27 DIAGNOSIS — M6281 Muscle weakness (generalized): Secondary | ICD-10-CM | POA: Diagnosis not present

## 2013-11-27 DIAGNOSIS — I739 Peripheral vascular disease, unspecified: Secondary | ICD-10-CM | POA: Diagnosis not present

## 2013-11-27 DIAGNOSIS — R262 Difficulty in walking, not elsewhere classified: Secondary | ICD-10-CM | POA: Diagnosis not present

## 2013-11-27 DIAGNOSIS — I10 Essential (primary) hypertension: Secondary | ICD-10-CM | POA: Diagnosis not present

## 2013-11-27 DIAGNOSIS — IMO0001 Reserved for inherently not codable concepts without codable children: Secondary | ICD-10-CM | POA: Diagnosis not present

## 2013-11-27 DIAGNOSIS — IMO0002 Reserved for concepts with insufficient information to code with codable children: Secondary | ICD-10-CM | POA: Diagnosis not present

## 2013-12-24 DIAGNOSIS — E785 Hyperlipidemia, unspecified: Secondary | ICD-10-CM | POA: Diagnosis not present

## 2013-12-24 DIAGNOSIS — M810 Age-related osteoporosis without current pathological fracture: Secondary | ICD-10-CM | POA: Diagnosis not present

## 2013-12-24 DIAGNOSIS — E538 Deficiency of other specified B group vitamins: Secondary | ICD-10-CM | POA: Diagnosis not present

## 2013-12-24 DIAGNOSIS — I9589 Other hypotension: Secondary | ICD-10-CM | POA: Diagnosis not present

## 2013-12-24 DIAGNOSIS — I10 Essential (primary) hypertension: Secondary | ICD-10-CM | POA: Diagnosis not present

## 2014-01-06 DIAGNOSIS — I10 Essential (primary) hypertension: Secondary | ICD-10-CM | POA: Diagnosis not present

## 2014-01-06 DIAGNOSIS — I9589 Other hypotension: Secondary | ICD-10-CM | POA: Diagnosis not present

## 2014-01-15 DIAGNOSIS — M1710 Unilateral primary osteoarthritis, unspecified knee: Secondary | ICD-10-CM | POA: Diagnosis not present

## 2014-02-03 DIAGNOSIS — J438 Other emphysema: Secondary | ICD-10-CM | POA: Diagnosis not present

## 2014-02-03 DIAGNOSIS — E785 Hyperlipidemia, unspecified: Secondary | ICD-10-CM | POA: Diagnosis not present

## 2014-02-03 DIAGNOSIS — I75029 Atheroembolism of unspecified lower extremity: Secondary | ICD-10-CM | POA: Diagnosis not present

## 2014-02-03 DIAGNOSIS — I10 Essential (primary) hypertension: Secondary | ICD-10-CM | POA: Diagnosis not present

## 2014-02-12 ENCOUNTER — Telehealth: Payer: Self-pay | Admitting: *Deleted

## 2014-02-12 NOTE — Telephone Encounter (Signed)
Received a refill request for Pletal from [facility]. We have not seen this patient since 2012 and so we need for patient to make an appt prior to filling this prescription. I told the pharmacist to see if her PCP would fill this instead; maybe patient has been seeing them instead of us for followup. Pharmacist will contact patient with this information.

## 2014-03-14 DIAGNOSIS — M25579 Pain in unspecified ankle and joints of unspecified foot: Secondary | ICD-10-CM | POA: Diagnosis not present

## 2014-03-31 DIAGNOSIS — M25579 Pain in unspecified ankle and joints of unspecified foot: Secondary | ICD-10-CM | POA: Diagnosis not present

## 2014-04-23 DIAGNOSIS — M1710 Unilateral primary osteoarthritis, unspecified knee: Secondary | ICD-10-CM | POA: Diagnosis not present

## 2014-04-25 ENCOUNTER — Encounter: Payer: Self-pay | Admitting: Vascular Surgery

## 2014-04-25 ENCOUNTER — Other Ambulatory Visit: Payer: Self-pay

## 2014-04-25 DIAGNOSIS — M79606 Pain in leg, unspecified: Secondary | ICD-10-CM

## 2014-04-25 DIAGNOSIS — M7989 Other specified soft tissue disorders: Secondary | ICD-10-CM

## 2014-05-05 ENCOUNTER — Encounter: Payer: Self-pay | Admitting: Vascular Surgery

## 2014-05-06 ENCOUNTER — Ambulatory Visit (HOSPITAL_COMMUNITY)
Admission: RE | Admit: 2014-05-06 | Discharge: 2014-05-06 | Disposition: A | Discharge status: Home / Self Care | Payer: Medicare Other | Source: Ambulatory Visit | Attending: Vascular Surgery | Admitting: Vascular Surgery

## 2014-05-06 ENCOUNTER — Ambulatory Visit (INDEPENDENT_AMBULATORY_CARE_PROVIDER_SITE_OTHER): Payer: Medicare Other | Admitting: Vascular Surgery

## 2014-05-06 ENCOUNTER — Encounter: Payer: Self-pay | Admitting: Vascular Surgery

## 2014-05-06 VITALS — BP 180/80 | HR 87 | Ht 63.0 in | Wt 145.0 lb

## 2014-05-06 DIAGNOSIS — M79606 Pain in leg, unspecified: Secondary | ICD-10-CM

## 2014-05-06 DIAGNOSIS — M7989 Other specified soft tissue disorders: Secondary | ICD-10-CM | POA: Diagnosis not present

## 2014-05-06 DIAGNOSIS — I739 Peripheral vascular disease, unspecified: Secondary | ICD-10-CM

## 2014-05-06 DIAGNOSIS — M79609 Pain in unspecified limb: Secondary | ICD-10-CM

## 2014-05-06 DIAGNOSIS — M79604 Pain in right leg: Secondary | ICD-10-CM

## 2014-05-06 NOTE — Progress Notes (Signed)
[facility] VEIN SPECIALISTS OF [place] HISTORY AND PHYSICAL  Referring: Dr. Hurshel KeysPete Dalldorf History of Present Illness:  Patient is a 80 y.o. year old female who presents for evaluation of leg swelling. The patient also complains of pain in her right forefoot. She fell approximately 2 months ago. She was seen by Dr. Jerl Santosalldorf and x-rays were negative for any fracture. She has had persistent swelling with no significant improvement. She does keep the leg dependent a fair amount of time. She has a long history of dragging the dorsum of her right foot since childhood. She denies prior history of stroke. She is ambulatory with a walker. The foot sometimes hurts at rest but usually with weight bearing. She denies prior history of DVT.  Other medical problems include hypertension, reflux, hyperlipidemia, arthritis, COPD all of which are currently stable.  Past Medical History  Diagnosis Date  . Hypertension   . GERD (gastroesophageal reflux disease)   . Carpal tunnel syndrome   . Hyperlipidemia   . Osteoarthritis   . COPD (chronic obstructive pulmonary disease)     Past Surgical History  Procedure Laterality Date  . Tonsillectomy    . Knee arthroscopy    . Left carpal tunnel      Social History History  Substance Use Topics  . Smoking status: Former Smoker -- 0.50 packs/day    Types: Cigarettes    Quit date: 09/16/2009  . Smokeless tobacco: Not on file     Comment: pt has started on e-cig today  . Alcohol Use: No    Family History Family History  Problem Relation Age of Onset  . Cancer Mother   . Cancer Father   . Hypertension Daughter   . Heart disease Daughter     before age 78    Allergies  Allergies  Allergen Reactions  . Tylenol [Acetaminophen] Other (See Comments)    Pt states "It makes me shake"     Current Outpatient Prescriptions  Medication Sig Dispense Refill  . albuterol (PROVENTIL) (2.5 MG/3ML) 0.083% nebulizer solution Take 2.5 mg by nebulization every 6  (six) hours as needed for wheezing or shortness of breath.      Marland Kitchen. aspirin 81 MG tablet Take 81 mg by mouth daily.        . cilostazol (PLETAL) 100 MG tablet TAKE 1 TABLET TWICE DAILY.  60 tablet  11  . cyanocobalamin 500 MCG tablet Take 1,000 mcg by mouth daily.        . meloxicam (MOBIC) 15 MG tablet Take 15 mg by mouth daily.        . multivitamin (THERAGRAN) per tablet Take 1 tablet by mouth daily.        . Naproxen Sodium (ALEVE PO) Take by mouth.        . simvastatin (ZOCOR) 10 MG tablet Take 10 mg by mouth at bedtime.        . vitamin B-12 (CYANOCOBALAMIN) 1000 MCG tablet Take 1,000 mcg by mouth daily.        . ergocalciferol (VITAMIN D2) 50000 UNITS capsule Take 10,000 Units by mouth once a week.        Marland Kitchen. lisinopril-hydrochlorothiazide (PRINZIDE,ZESTORETIC) 20-12.5 MG per tablet Take 1 tablet by mouth daily.         No current facility-administered medications for this visit.    ROS:   General:  No weight loss, Fever, chills  HEENT: No recent headaches, no nasal bleeding, no visual changes, no sore throat  Neurologic: No dizziness, blackouts,  seizures. No recent symptoms of stroke or mini- stroke. No recent episodes of slurred speech, or temporary blindness.  Cardiac: No recent episodes of chest pain/pressure, no shortness of breath at rest.  No shortness of breath with exertion.  Denies history of atrial fibrillation or irregular heartbeat  Vascular: No history of rest pain in feet.  No history of claudication.  No history of non-healing ulcer, No history of DVT   Pulmonary: No home oxygen, no productive cough, no hemoptysis,  No asthma or wheezing  Musculoskeletal:  [x ] Arthritis, [ ]  Low back pain,  [ ]  Joint pain  Hematologic:No history of hypercoagulable state.  No history of easy bleeding.  No history of anemia  Gastrointestinal: No hematochezia or melena,  No gastroesophageal reflux, no trouble swallowing  Urinary: [ ]  chronic Kidney disease, [ ]  on HD - [ ]  MWF or [  ] TTHS, [ ]  Burning with urination, [ ]  Frequent urination, [ ]  Difficulty urinating;   Skin: No rashes  Psychological: No history of anxiety,  No history of depression   Physical Examination  Filed Vitals:   05/06/14 1512  BP: 180/80  Pulse: 87  Height: 5\' 3"  (1.6 m)  Weight: 145 lb (65.772 kg)  SpO2: 96%    Body mass index is 25.69 kg/(m^2).  General:  Alert and oriented, no acute distress HEENT: Normal Neck: No bruit or JVD Pulmonary: Clear to auscultation bilaterally Cardiac: Regular Rate and Rhythm without murmur Abdomen: Soft, non-tender, non-distended, no mass Skin: No rash Extremity Pulses:  2+ radial, brachial, absent femoral, dorsalis pedis, posterior tibial pulses bilaterally however this is primarily due to the fact that the patient is in a wheelchair and unable to get up onto the exam table. Musculoskeletal: Slight right foot drop, edema from the right foot to the mid tibia right foot and leg trace edema left foot and leg Neurologic: Upper and lower extremity motor 5/5 and symmetric  DATA:  I reviewed the patient's ABIs dated October 2012. This showed an ABI on the right of 0.57 left 0.76. The patient had a venous reflux exam today which showed evidence of reflux in the right greater saphenous vein in the right common femoral and popliteal vein   ASSESSMENT:  Edema right leg most likely secondary to dependency. She does have some edema and venous reflux could be contributing to this. However she also has pain in her right foot with known history of peripheral arterial disease and it is difficult to tell currently whether or not the pain in her forefoot is related to trauma or progression of peripheral arterial disease   PLAN:  We will give her a trial of compression with Ace wraps or compression stockings whichever she is better able to tolerate. Hopefully this will improve her edema symptoms. She will also try to elevate her legs above the level of her heart as much  as possible. She will return in 3-4 weeks to see if her symptoms have improved. We will also repeat her ABIs at that time to see [place] her arterial circulation has declined over time.  Fabienne Bruns, MD [facility] Special[facility] of [place] Office: [phone] Pager: [phone]

## 2014-05-21 ENCOUNTER — Encounter: Payer: Self-pay | Admitting: Vascular Surgery

## 2014-05-22 ENCOUNTER — Ambulatory Visit (INDEPENDENT_AMBULATORY_CARE_PROVIDER_SITE_OTHER): Payer: Medicare Other | Admitting: Vascular Surgery

## 2014-05-22 ENCOUNTER — Encounter: Payer: Self-pay | Admitting: Vascular Surgery

## 2014-05-22 ENCOUNTER — Ambulatory Visit (HOSPITAL_COMMUNITY)
Admission: RE | Admit: 2014-05-22 | Discharge: 2014-05-22 | Disposition: A | Discharge status: Home / Self Care | Payer: Medicare Other | Source: Ambulatory Visit | Attending: Vascular Surgery | Admitting: Vascular Surgery

## 2014-05-22 VITALS — BP 144/66 | HR 91 | Ht 63.0 in | Wt 145.0 lb

## 2014-05-22 DIAGNOSIS — I739 Peripheral vascular disease, unspecified: Secondary | ICD-10-CM | POA: Diagnosis not present

## 2014-05-22 DIAGNOSIS — M7989 Other specified soft tissue disorders: Secondary | ICD-10-CM | POA: Diagnosis not present

## 2014-05-22 DIAGNOSIS — M79609 Pain in unspecified limb: Secondary | ICD-10-CM | POA: Diagnosis not present

## 2014-05-22 DIAGNOSIS — M79604 Pain in right leg: Secondary | ICD-10-CM

## 2014-05-22 DIAGNOSIS — I872 Venous insufficiency (chronic) (peripheral): Secondary | ICD-10-CM

## 2014-05-22 NOTE — Progress Notes (Signed)
VASC[facility]IN SPECIALISTS OF [place] HISTORY AND PHYSICAL   Referring: Dr. Hurshel Keys History of Present Illness:  Patient is a 80 y.o. year old female who presents for evaluation of leg swelling. The patient also complains of pain in her right forefoot. She fell approximately 2 months ago. She was seen by Dr. Jerl Santos and x-rays were negative for any fracture. She has had persistent swelling with no significant improvement. She does keep the leg dependent a fair amount of time. She has a long history of dragging the dorsum of her right foot since childhood. She denies prior history of stroke. She is ambulatory with a walker. The foot sometimes hurts at rest but usually with weight bearing. She denies prior history of DVT.  Other medical problems include hypertension, reflux, hyperlipidemia, arthritis, COPD all of which are currently stable.    Past Medical History   Diagnosis  Date   .  Hypertension     .  GERD (gastroesophageal reflux disease)     .  Carpal tunnel syndrome     .  Hyperlipidemia     .  Osteoarthritis     .  COPD (chronic obstructive pulmonary disease)         Past Surgical History   Procedure  Laterality  Date   .  Tonsillectomy       .  Knee arthroscopy       .  Left carpal tunnel         Social History History   Substance Use Topics   .  Smoking status:  Former Smoker -- 0.50 packs/day       Types:  Cigarettes       Quit date:  09/16/2009   .  Smokeless tobacco:  Not on file         Comment: pt has started on e-cig today   .  Alcohol Use:  No     Family History Family History   Problem  Relation  Age of Onset   .  Cancer  Mother     .  Cancer  Father     .  Hypertension  Daughter     .  Heart disease  Daughter         before age 61     Allergies    Allergies   Allergen  Reactions   .  Tylenol [Acetaminophen]  Other (See Comments)       Pt states "It makes me shake"        Current Outpatient Prescriptions   Medication  Sig  Dispense   Refill   .  albuterol (PROVENTIL) (2.5 MG/3ML) 0.083% nebulizer solution  Take 2.5 mg by nebulization every 6 (six) hours as needed for wheezing or shortness of breath.         Marland Kitchen  aspirin 81 MG tablet  Take 81 mg by mouth daily.           .  cilostazol (PLETAL) 100 MG tablet  TAKE 1 TABLET TWICE DAILY.   60 tablet   11   .  cyanocobalamin 500 MCG tablet  Take 1,000 mcg by mouth daily.           .  meloxicam (MOBIC) 15 MG tablet  Take 15 mg by mouth daily.           .  multivitamin (THERAGRAN) per tablet  Take 1 tablet by mouth daily.           Marland Kitchen  Naproxen Sodium (ALEVE PO)  Take by mouth.           .  simvastatin (ZOCOR) 10 MG tablet  Take 10 mg by mouth at bedtime.           .  vitamin B-12 (CYANOCOBALAMIN) 1000 MCG tablet  Take 1,000 mcg by mouth daily.           .  ergocalciferol (VITAMIN D2) 50000 UNITS capsule  Take 10,000 Units by mouth once a week.           Marland Kitchen.  lisinopril-hydrochlorothiazide (PRINZIDE,ZESTORETIC) 20-12.5 MG per tablet  Take 1 tablet by mouth daily.              No current facility-administered medications for this visit.     ROS:    General:  No weight loss, Fever, chills  HEENT: No recent headaches, no nasal bleeding, no visual changes, no sore throat  Neurologic: No dizziness, blackouts, seizures. No recent symptoms of stroke or mini- stroke. No recent episodes of slurred speech, or temporary blindness.  Cardiac: No recent episodes of chest pain/pressure, no shortness of breath at rest.  No shortness of breath with exertion.  Denies history of atrial fibrillation or irregular heartbeat  Vascular: No history of rest pain in feet.  No history of claudication.  No history of non-healing ulcer, No history of DVT    Pulmonary: No home oxygen, no productive cough, no hemoptysis,  No asthma or wheezing  Musculoskeletal:  [x ] Arthritis, [ ]  Low back pain,  [ ]  Joint pain  Hematologic:No history of hypercoagulable state.  No history of easy bleeding.  No history of  anemia  Gastrointestinal: No hematochezia or melena,  No gastroesophageal reflux, no trouble swallowing  Urinary: [ ]  chronic Kidney disease, [ ]  on HD - [ ]  MWF or [ ]  TTHS, [ ]  Burning with urination, [ ]  Frequent urination, [ ]  Difficulty urinating;    Skin: No rashes  Psychological: No history of anxiety,  No history of depression   Physical Examination    Filed Vitals:     05/06/14 1512   BP:  180/80   Pulse:  87   Height:  5\' 3"  (1.6 m)   Weight:  145 lb (65.772 kg)   SpO2:  96%     Body mass index is 25.69 kg/(m^2).  General:  Alert and oriented, no acute distress HEENT: Normal Neck: No bruit or JVD Pulmonary: Clear to auscultation bilaterally Cardiac: Regular Rate and Rhythm without murmur Abdomen: Soft, non-tender, non-distended, no mass Skin: No rash Extremity Pulses:  2+ radial, brachial, absent femoral, dorsalis pedis, posterior tibial pulses bilaterally however this is primarily due to the fact that the patient is in a wheelchair and unable to get up onto the exam table. Musculoskeletal: Slight right foot drop, edema from the right foot to the mid tibia right foot and leg trace edema left foot and leg Neurologic: Upper and lower extremity motor 5/5 and symmetric  DATA:  she had bilateral ABI today which I reviewed and interpreted on the right of 0.52 left 0.78. The patient had a venous reflux exam previously which showed evidence of reflux in the right greater saphenous vein in the right common femoral and popliteal vein   ASSESSMENT:  Edema right leg most likely secondary to dependency with some component of reflux. Her ABIs have not declined significantly.  PLAN:  she will continue compression with Ace wraps or compression stockings whichever she is better  able to tolerate. Hopefully this will improve her edema symptoms. She will also try to elevate her legs above the level of her heart as much as possible. She will return on an as-needed basis with worsening  symptoms.  Fabienne Bruns, MD [facility] [place] Office: [phone] Pager: [phone]

## 2014-06-12 DIAGNOSIS — M1710 Unilateral primary osteoarthritis, unspecified knee: Secondary | ICD-10-CM | POA: Diagnosis not present

## 2014-06-12 DIAGNOSIS — IMO0002 Reserved for concepts with insufficient information to code with codable children: Secondary | ICD-10-CM | POA: Diagnosis not present

## 2014-06-18 DIAGNOSIS — M1710 Unilateral primary osteoarthritis, unspecified knee: Secondary | ICD-10-CM | POA: Diagnosis not present

## 2014-06-24 DIAGNOSIS — Z5189 Encounter for other specified aftercare: Secondary | ICD-10-CM | POA: Diagnosis not present

## 2014-06-24 DIAGNOSIS — M170 Bilateral primary osteoarthritis of knee: Secondary | ICD-10-CM | POA: Diagnosis not present

## 2014-06-24 DIAGNOSIS — I10 Essential (primary) hypertension: Secondary | ICD-10-CM | POA: Diagnosis not present

## 2014-06-24 DIAGNOSIS — R262 Difficulty in walking, not elsewhere classified: Secondary | ICD-10-CM | POA: Diagnosis not present

## 2014-06-24 DIAGNOSIS — J45909 Unspecified asthma, uncomplicated: Secondary | ICD-10-CM | POA: Diagnosis not present

## 2014-06-24 DIAGNOSIS — Z9181 History of falling: Secondary | ICD-10-CM | POA: Diagnosis not present

## 2014-06-24 DIAGNOSIS — M6281 Muscle weakness (generalized): Secondary | ICD-10-CM | POA: Diagnosis not present

## 2014-07-02 DIAGNOSIS — J45909 Unspecified asthma, uncomplicated: Secondary | ICD-10-CM | POA: Diagnosis not present

## 2014-07-02 DIAGNOSIS — R262 Difficulty in walking, not elsewhere classified: Secondary | ICD-10-CM | POA: Diagnosis not present

## 2014-07-02 DIAGNOSIS — M6281 Muscle weakness (generalized): Secondary | ICD-10-CM | POA: Diagnosis not present

## 2014-07-02 DIAGNOSIS — Z5189 Encounter for other specified aftercare: Secondary | ICD-10-CM | POA: Diagnosis not present

## 2014-07-02 DIAGNOSIS — I10 Essential (primary) hypertension: Secondary | ICD-10-CM | POA: Diagnosis not present

## 2014-07-02 DIAGNOSIS — M170 Bilateral primary osteoarthritis of knee: Secondary | ICD-10-CM | POA: Diagnosis not present

## 2014-07-04 DIAGNOSIS — R262 Difficulty in walking, not elsewhere classified: Secondary | ICD-10-CM | POA: Diagnosis not present

## 2014-07-04 DIAGNOSIS — I10 Essential (primary) hypertension: Secondary | ICD-10-CM | POA: Diagnosis not present

## 2014-07-04 DIAGNOSIS — Z5189 Encounter for other specified aftercare: Secondary | ICD-10-CM | POA: Diagnosis not present

## 2014-07-04 DIAGNOSIS — M6281 Muscle weakness (generalized): Secondary | ICD-10-CM | POA: Diagnosis not present

## 2014-07-04 DIAGNOSIS — J45909 Unspecified asthma, uncomplicated: Secondary | ICD-10-CM | POA: Diagnosis not present

## 2014-07-04 DIAGNOSIS — M170 Bilateral primary osteoarthritis of knee: Secondary | ICD-10-CM | POA: Diagnosis not present

## 2014-07-09 DIAGNOSIS — M6281 Muscle weakness (generalized): Secondary | ICD-10-CM | POA: Diagnosis not present

## 2014-07-09 DIAGNOSIS — J45909 Unspecified asthma, uncomplicated: Secondary | ICD-10-CM | POA: Diagnosis not present

## 2014-07-09 DIAGNOSIS — M170 Bilateral primary osteoarthritis of knee: Secondary | ICD-10-CM | POA: Diagnosis not present

## 2014-07-09 DIAGNOSIS — R262 Difficulty in walking, not elsewhere classified: Secondary | ICD-10-CM | POA: Diagnosis not present

## 2014-07-09 DIAGNOSIS — Z5189 Encounter for other specified aftercare: Secondary | ICD-10-CM | POA: Diagnosis not present

## 2014-07-09 DIAGNOSIS — I10 Essential (primary) hypertension: Secondary | ICD-10-CM | POA: Diagnosis not present

## 2014-07-12 DIAGNOSIS — I10 Essential (primary) hypertension: Secondary | ICD-10-CM | POA: Diagnosis not present

## 2014-07-12 DIAGNOSIS — M6281 Muscle weakness (generalized): Secondary | ICD-10-CM | POA: Diagnosis not present

## 2014-07-12 DIAGNOSIS — Z5189 Encounter for other specified aftercare: Secondary | ICD-10-CM | POA: Diagnosis not present

## 2014-07-12 DIAGNOSIS — J45909 Unspecified asthma, uncomplicated: Secondary | ICD-10-CM | POA: Diagnosis not present

## 2014-07-12 DIAGNOSIS — R262 Difficulty in walking, not elsewhere classified: Secondary | ICD-10-CM | POA: Diagnosis not present

## 2014-07-12 DIAGNOSIS — M170 Bilateral primary osteoarthritis of knee: Secondary | ICD-10-CM | POA: Diagnosis not present

## 2014-07-17 DIAGNOSIS — J45909 Unspecified asthma, uncomplicated: Secondary | ICD-10-CM | POA: Diagnosis not present

## 2014-07-17 DIAGNOSIS — I10 Essential (primary) hypertension: Secondary | ICD-10-CM | POA: Diagnosis not present

## 2014-07-17 DIAGNOSIS — M170 Bilateral primary osteoarthritis of knee: Secondary | ICD-10-CM | POA: Diagnosis not present

## 2014-07-17 DIAGNOSIS — Z5189 Encounter for other specified aftercare: Secondary | ICD-10-CM | POA: Diagnosis not present

## 2014-07-17 DIAGNOSIS — M6281 Muscle weakness (generalized): Secondary | ICD-10-CM | POA: Diagnosis not present

## 2014-07-17 DIAGNOSIS — R262 Difficulty in walking, not elsewhere classified: Secondary | ICD-10-CM | POA: Diagnosis not present

## 2014-07-18 DIAGNOSIS — Z5189 Encounter for other specified aftercare: Secondary | ICD-10-CM | POA: Diagnosis not present

## 2014-07-18 DIAGNOSIS — M170 Bilateral primary osteoarthritis of knee: Secondary | ICD-10-CM | POA: Diagnosis not present

## 2014-07-18 DIAGNOSIS — R262 Difficulty in walking, not elsewhere classified: Secondary | ICD-10-CM | POA: Diagnosis not present

## 2014-07-18 DIAGNOSIS — I10 Essential (primary) hypertension: Secondary | ICD-10-CM | POA: Diagnosis not present

## 2014-07-18 DIAGNOSIS — J45909 Unspecified asthma, uncomplicated: Secondary | ICD-10-CM | POA: Diagnosis not present

## 2014-07-18 DIAGNOSIS — M6281 Muscle weakness (generalized): Secondary | ICD-10-CM | POA: Diagnosis not present

## 2014-07-22 DIAGNOSIS — J45909 Unspecified asthma, uncomplicated: Secondary | ICD-10-CM | POA: Diagnosis not present

## 2014-07-22 DIAGNOSIS — M6281 Muscle weakness (generalized): Secondary | ICD-10-CM | POA: Diagnosis not present

## 2014-07-22 DIAGNOSIS — Z5189 Encounter for other specified aftercare: Secondary | ICD-10-CM | POA: Diagnosis not present

## 2014-07-22 DIAGNOSIS — I10 Essential (primary) hypertension: Secondary | ICD-10-CM | POA: Diagnosis not present

## 2014-07-22 DIAGNOSIS — R262 Difficulty in walking, not elsewhere classified: Secondary | ICD-10-CM | POA: Diagnosis not present

## 2014-07-22 DIAGNOSIS — M170 Bilateral primary osteoarthritis of knee: Secondary | ICD-10-CM | POA: Diagnosis not present

## 2014-07-30 DIAGNOSIS — I10 Essential (primary) hypertension: Secondary | ICD-10-CM | POA: Diagnosis not present

## 2014-07-30 DIAGNOSIS — Z5189 Encounter for other specified aftercare: Secondary | ICD-10-CM | POA: Diagnosis not present

## 2014-07-30 DIAGNOSIS — R262 Difficulty in walking, not elsewhere classified: Secondary | ICD-10-CM | POA: Diagnosis not present

## 2014-07-30 DIAGNOSIS — J45909 Unspecified asthma, uncomplicated: Secondary | ICD-10-CM | POA: Diagnosis not present

## 2014-07-30 DIAGNOSIS — M170 Bilateral primary osteoarthritis of knee: Secondary | ICD-10-CM | POA: Diagnosis not present

## 2014-07-30 DIAGNOSIS — M6281 Muscle weakness (generalized): Secondary | ICD-10-CM | POA: Diagnosis not present

## 2014-07-31 DIAGNOSIS — M6281 Muscle weakness (generalized): Secondary | ICD-10-CM | POA: Diagnosis not present

## 2014-07-31 DIAGNOSIS — I10 Essential (primary) hypertension: Secondary | ICD-10-CM | POA: Diagnosis not present

## 2014-07-31 DIAGNOSIS — Z5189 Encounter for other specified aftercare: Secondary | ICD-10-CM | POA: Diagnosis not present

## 2014-07-31 DIAGNOSIS — R262 Difficulty in walking, not elsewhere classified: Secondary | ICD-10-CM | POA: Diagnosis not present

## 2014-07-31 DIAGNOSIS — M170 Bilateral primary osteoarthritis of knee: Secondary | ICD-10-CM | POA: Diagnosis not present

## 2014-07-31 DIAGNOSIS — J45909 Unspecified asthma, uncomplicated: Secondary | ICD-10-CM | POA: Diagnosis not present

## 2014-08-05 DIAGNOSIS — I10 Essential (primary) hypertension: Secondary | ICD-10-CM | POA: Diagnosis not present

## 2014-08-05 DIAGNOSIS — Z5189 Encounter for other specified aftercare: Secondary | ICD-10-CM | POA: Diagnosis not present

## 2014-08-05 DIAGNOSIS — J45909 Unspecified asthma, uncomplicated: Secondary | ICD-10-CM | POA: Diagnosis not present

## 2014-08-05 DIAGNOSIS — R262 Difficulty in walking, not elsewhere classified: Secondary | ICD-10-CM | POA: Diagnosis not present

## 2014-08-05 DIAGNOSIS — M170 Bilateral primary osteoarthritis of knee: Secondary | ICD-10-CM | POA: Diagnosis not present

## 2014-08-05 DIAGNOSIS — M6281 Muscle weakness (generalized): Secondary | ICD-10-CM | POA: Diagnosis not present

## 2014-08-12 DIAGNOSIS — I10 Essential (primary) hypertension: Secondary | ICD-10-CM | POA: Diagnosis not present

## 2014-08-12 DIAGNOSIS — Z0000 Encounter for general adult medical examination without abnormal findings: Secondary | ICD-10-CM | POA: Diagnosis not present

## 2014-08-12 DIAGNOSIS — Z23 Encounter for immunization: Secondary | ICD-10-CM | POA: Diagnosis not present

## 2014-08-12 DIAGNOSIS — E538 Deficiency of other specified B group vitamins: Secondary | ICD-10-CM | POA: Diagnosis not present

## 2014-08-12 DIAGNOSIS — M810 Age-related osteoporosis without current pathological fracture: Secondary | ICD-10-CM | POA: Diagnosis not present

## 2014-08-18 DIAGNOSIS — N390 Urinary tract infection, site not specified: Secondary | ICD-10-CM | POA: Diagnosis not present

## 2014-08-21 DIAGNOSIS — I75021 Atheroembolism of right lower extremity: Secondary | ICD-10-CM | POA: Diagnosis not present

## 2014-08-21 DIAGNOSIS — J439 Emphysema, unspecified: Secondary | ICD-10-CM | POA: Diagnosis not present

## 2014-08-21 DIAGNOSIS — I10 Essential (primary) hypertension: Secondary | ICD-10-CM | POA: Diagnosis not present

## 2014-08-21 DIAGNOSIS — E785 Hyperlipidemia, unspecified: Secondary | ICD-10-CM | POA: Diagnosis not present

## 2014-09-19 DIAGNOSIS — M1711 Unilateral primary osteoarthritis, right knee: Secondary | ICD-10-CM | POA: Diagnosis not present

## 2014-09-19 DIAGNOSIS — M172 Bilateral post-traumatic osteoarthritis of knee: Secondary | ICD-10-CM | POA: Diagnosis not present

## 2014-12-24 DIAGNOSIS — M1711 Unilateral primary osteoarthritis, right knee: Secondary | ICD-10-CM | POA: Diagnosis not present

## 2014-12-24 DIAGNOSIS — M1712 Unilateral primary osteoarthritis, left knee: Secondary | ICD-10-CM | POA: Diagnosis not present

## 2015-02-18 DIAGNOSIS — I10 Essential (primary) hypertension: Secondary | ICD-10-CM | POA: Diagnosis not present

## 2015-02-18 DIAGNOSIS — D81818 Other biotin-dependent carboxylase deficiency: Secondary | ICD-10-CM | POA: Diagnosis not present

## 2015-02-18 DIAGNOSIS — E785 Hyperlipidemia, unspecified: Secondary | ICD-10-CM | POA: Diagnosis not present

## 2015-02-18 DIAGNOSIS — M810 Age-related osteoporosis without current pathological fracture: Secondary | ICD-10-CM | POA: Diagnosis not present

## 2015-03-05 DIAGNOSIS — M19012 Primary osteoarthritis, left shoulder: Secondary | ICD-10-CM | POA: Diagnosis not present

## 2015-03-05 DIAGNOSIS — I10 Essential (primary) hypertension: Secondary | ICD-10-CM | POA: Diagnosis not present

## 2015-03-05 DIAGNOSIS — I75029 Atheroembolism of unspecified lower extremity: Secondary | ICD-10-CM | POA: Diagnosis not present

## 2015-03-05 DIAGNOSIS — E785 Hyperlipidemia, unspecified: Secondary | ICD-10-CM | POA: Diagnosis not present

## 2015-03-05 DIAGNOSIS — M25512 Pain in left shoulder: Secondary | ICD-10-CM | POA: Diagnosis not present

## 2015-03-05 DIAGNOSIS — M810 Age-related osteoporosis without current pathological fracture: Secondary | ICD-10-CM | POA: Diagnosis not present

## 2015-05-04 DIAGNOSIS — R109 Unspecified abdominal pain: Secondary | ICD-10-CM | POA: Diagnosis not present

## 2015-05-04 DIAGNOSIS — I5032 Chronic diastolic (congestive) heart failure: Secondary | ICD-10-CM | POA: Diagnosis not present

## 2015-05-04 DIAGNOSIS — J449 Chronic obstructive pulmonary disease, unspecified: Secondary | ICD-10-CM | POA: Diagnosis not present

## 2015-05-04 DIAGNOSIS — F17210 Nicotine dependence, cigarettes, uncomplicated: Secondary | ICD-10-CM | POA: Diagnosis not present

## 2015-05-04 DIAGNOSIS — J90 Pleural effusion, not elsewhere classified: Secondary | ICD-10-CM | POA: Diagnosis not present

## 2015-05-04 DIAGNOSIS — J441 Chronic obstructive pulmonary disease with (acute) exacerbation: Secondary | ICD-10-CM | POA: Diagnosis not present

## 2015-05-05 DIAGNOSIS — J441 Chronic obstructive pulmonary disease with (acute) exacerbation: Secondary | ICD-10-CM | POA: Diagnosis not present

## 2015-05-25 ENCOUNTER — Emergency Department (HOSPITAL_COMMUNITY)
Admission: EM | Admit: 2015-05-25 | Discharge: 2015-05-25 | Disposition: A | Discharge status: Home / Self Care | Payer: Medicare Other | Attending: Emergency Medicine | Admitting: Emergency Medicine

## 2015-05-25 ENCOUNTER — Emergency Department (HOSPITAL_COMMUNITY): Payer: Medicare Other

## 2015-05-25 ENCOUNTER — Encounter (HOSPITAL_COMMUNITY): Payer: Self-pay | Admitting: Emergency Medicine

## 2015-05-25 DIAGNOSIS — Z8669 Personal history of other diseases of the nervous system and sense organs: Secondary | ICD-10-CM | POA: Insufficient documentation

## 2015-05-25 DIAGNOSIS — Z7982 Long term (current) use of aspirin: Secondary | ICD-10-CM | POA: Diagnosis not present

## 2015-05-25 DIAGNOSIS — Z79899 Other long term (current) drug therapy: Secondary | ICD-10-CM | POA: Diagnosis not present

## 2015-05-25 DIAGNOSIS — E876 Hypokalemia: Secondary | ICD-10-CM | POA: Diagnosis not present

## 2015-05-25 DIAGNOSIS — Z791 Long term (current) use of non-steroidal anti-inflammatories (NSAID): Secondary | ICD-10-CM | POA: Insufficient documentation

## 2015-05-25 DIAGNOSIS — J441 Chronic obstructive pulmonary disease with (acute) exacerbation: Secondary | ICD-10-CM | POA: Diagnosis not present

## 2015-05-25 DIAGNOSIS — H9193 Unspecified hearing loss, bilateral: Secondary | ICD-10-CM | POA: Diagnosis not present

## 2015-05-25 DIAGNOSIS — R0600 Dyspnea, unspecified: Secondary | ICD-10-CM

## 2015-05-25 DIAGNOSIS — E785 Hyperlipidemia, unspecified: Secondary | ICD-10-CM | POA: Diagnosis not present

## 2015-05-25 DIAGNOSIS — Z87891 Personal history of nicotine dependence: Secondary | ICD-10-CM | POA: Insufficient documentation

## 2015-05-25 DIAGNOSIS — M1990 Unspecified osteoarthritis, unspecified site: Secondary | ICD-10-CM | POA: Insufficient documentation

## 2015-05-25 DIAGNOSIS — I10 Essential (primary) hypertension: Secondary | ICD-10-CM | POA: Diagnosis not present

## 2015-05-25 DIAGNOSIS — Z8719 Personal history of other diseases of the digestive system: Secondary | ICD-10-CM | POA: Diagnosis not present

## 2015-05-25 DIAGNOSIS — M6281 Muscle weakness (generalized): Secondary | ICD-10-CM | POA: Diagnosis not present

## 2015-05-25 DIAGNOSIS — R531 Weakness: Secondary | ICD-10-CM

## 2015-05-25 DIAGNOSIS — R0602 Shortness of breath: Secondary | ICD-10-CM | POA: Diagnosis present

## 2015-05-25 LAB — CBC WITH DIFFERENTIAL/PLATELET
Basophils Absolute: 0 10*3/uL (ref 0.0–0.1)
Basophils Relative: 0 % (ref 0–1)
Eosinophils Absolute: 0.1 10*3/uL (ref 0.0–0.7)
Eosinophils Relative: 2 % (ref 0–5)
HEMATOCRIT: 36.8 % (ref 36.0–46.0)
HEMOGLOBIN: 12.2 g/dL (ref 12.0–15.0)
LYMPHS PCT: 28 % (ref 12–46)
Lymphs Abs: 2.5 10*3/uL (ref 0.7–4.0)
MCH: 32.5 pg (ref 26.0–34.0)
MCHC: 33.2 g/dL (ref 30.0–36.0)
MCV: 98.1 fL (ref 78.0–100.0)
MONOS PCT: 7 % (ref 3–12)
Monocytes Absolute: 0.7 10*3/uL (ref 0.1–1.0)
NEUTROS ABS: 5.8 10*3/uL (ref 1.7–7.7)
Neutrophils Relative %: 63 % (ref 43–77)
PLATELETS: 262 10*3/uL (ref 150–400)
RBC: 3.75 MIL/uL — AB (ref 3.87–5.11)
RDW: 14.4 % (ref 11.5–15.5)
WBC: 9.2 10*3/uL (ref 4.0–10.5)

## 2015-05-25 LAB — BASIC METABOLIC PANEL
ANION GAP: 7 (ref 5–15)
BUN: 12 mg/dL (ref 6–20)
CO2: 28 mmol/L (ref 22–32)
Calcium: 8.7 mg/dL — ABNORMAL LOW (ref 8.9–10.3)
Chloride: 102 mmol/L (ref 101–111)
Creatinine, Ser: 0.89 mg/dL (ref 0.44–1.00)
GFR, EST NON AFRICAN AMERICAN: 57 mL/min — AB (ref >60)
Glucose, Bld: 105 mg/dL — ABNORMAL HIGH (ref 65–99)
Potassium: 2.9 mmol/L — ABNORMAL LOW (ref 3.5–5.1)
Sodium: 137 mmol/L (ref 135–145)

## 2015-05-25 LAB — I-STAT TROPONIN, ED: TROPONIN I, POC: 0 ng/mL (ref 0.00–0.08)

## 2015-05-25 MED ORDER — ALBUTEROL SULFATE (2.5 MG/3ML) 0.083% IN NEBU
5.0000 mg | INHALATION_SOLUTION | Freq: Once | RESPIRATORY_TRACT | Status: AC
  Administered 2015-05-25: 5 mg via RESPIRATORY_TRACT

## 2015-05-25 MED ORDER — POTASSIUM CHLORIDE ER 20 MEQ PO TBCR: 10.0000 meq | EXTENDED_RELEASE_TABLET | Freq: Every day | ORAL | Status: DC

## 2015-05-25 MED ORDER — ALBUTEROL SULFATE (2.5 MG/3ML) 0.083% IN NEBU
INHALATION_SOLUTION | RESPIRATORY_TRACT | Status: DC
Start: 2015-05-25 — End: 2015-05-26
  Filled 2015-05-25: qty 6

## 2015-05-25 MED ORDER — IPRATROPIUM-ALBUTEROL 0.5-2.5 (3) MG/3ML IN SOLN: 3.0000 mL | Freq: Four times a day (QID) | RESPIRATORY_TRACT | Status: DC

## 2015-05-25 NOTE — Discharge Instructions (Signed)
Alternate the Duoneb and Albuterol as needed for trouble breathing every 4-6 hours. Get plenty of rest. Elevate your feet above your heart, as much as possible.  Shortness of Breath Shortness of breath means you have trouble breathing. It could also mean that you have a medical problem. You should get immediate medical care for shortness of breath. CAUSES   Not enough oxygen in the air such as with high altitudes or a smoke-filled room.  Certain lung diseases, infections, or problems.  Heart disease or conditions, such as angina or heart failure.  Low red blood cells (anemia).  Poor physical fitness, which can cause shortness of breath when you exercise.  Chest or back injuries or stiffness.  Being overweight.  Smoking.  Anxiety, which can make you feel like you are not getting enough air. DIAGNOSIS  Serious medical problems can often be found during your physical exam. Tests may also be done to determine why you are having shortness of breath. Tests may include:  Chest X-rays.  Lung function tests.  Blood tests.  An electrocardiogram (ECG).  An ambulatory electrocardiogram. An ambulatory ECG records your heartbeat patterns over a 24-hour period.  Exercise testing.  A transthoracic echocardiogram (TTE). During echocardiography, sound waves are used to evaluate how blood flows through your heart.  A transesophageal echocardiogram (TEE).  Imaging scans. Your health care provider may not be able to find a cause for your shortness of breath after your exam. In this case, it is important to have a follow-up exam with your health care provider as directed.  TREATMENT  Treatment for shortness of breath depends on the cause of your symptoms and can vary greatly. HOME CARE INSTRUCTIONS   Do not smoke. Smoking is a common cause of shortness of breath. If you smoke, ask for help to quit.  Avoid being around chemicals or things that may bother your breathing, such as paint  fumes and dust.  Rest as needed. Slowly resume your usual activities.  If medicines were prescribed, take them as directed for the full length of time directed. This includes oxygen and any inhaled medicines.  Keep all follow-up appointments as directed by your health care provider. SEEK MEDICAL CARE IF:   Your condition does not improve in the time expected.  You have a hard time doing your normal activities even with rest.  You have any new symptoms. SEEK IMMEDIATE MEDICAL CARE IF:   Your shortness of breath gets worse.  You feel light-headed, faint, or develop a cough not controlled with medicines.  You start coughing up blood.  You have pain with breathing.  You have chest pain or pain in your arms, shoulders, or abdomen.  You have a fever.  You are unable to walk up stairs or exercise the way you normally do. MAKE SURE YOU:  Understand these instructions.  Will watch your condition.  Will get help right away if you are not doing well or get worse. Document Released: 06/28/2001 Document Revised: 10/08/2013 Document Reviewed: 12/19/2011 [facility] Patient Information 2015 [place], [place]. This information is not intended to replace advice given to you by your health care provider. Make sure you discuss any questions you have with your health care provider.  Hypokalemia Hypokalemia means that the amount of potassium in the blood is lower than normal.Potassium is a chemical, called an electrolyte, that helps regulate the amount of fluid in the body. It also stimulates muscle contraction and helps nerves function properly.Most of the body's potassium is inside of cells,  and only a very small amount is in the blood. Because the amount in the blood is so small, minor changes can be life-threatening. CAUSES  Antibiotics.  Diarrhea or vomiting.  Using laxatives too much, which can cause diarrhea.  Chronic kidney disease.  Water pills (diuretics).  Eating disorders  (bulimia).  Low magnesium level.  Sweating a lot. SIGNS AND SYMPTOMS  Weakness.  Constipation.  Fatigue.  Muscle cramps.  Mental confusion.  Skipped heartbeats or irregular heartbeat (palpitations).  Tingling or numbness. DIAGNOSIS  Your health care provider can diagnose hypokalemia with blood tests. In addition to checking your potassium level, your health care provider may also check other lab tests. TREATMENT Hypokalemia can be treated with potassium supplements taken by mouth or adjustments in your current medicines. If your potassium level is very low, you may need to get potassium through a vein (IV) and be monitored in the hospital. A diet high in potassium is also helpful. Foods high in potassium are:  Nuts, such as peanuts and pistachios.  Seeds, such as sunflower seeds and pumpkin seeds.  Peas, lentils, and lima beans.  Whole grain and bran cereals and breads.  Fresh fruit and vegetables, such as apricots, avocado, bananas, cantaloupe, kiwi, oranges, tomatoes, asparagus, and potatoes.  Orange and tomato juices.  Red meats.  Fruit yogurt. HOME CARE INSTRUCTIONS  Take all medicines as prescribed by your health care provider.  Maintain a healthy diet by including nutritious food, such as fruits, vegetables, nuts, whole grains, and lean meats.  If you are taking a laxative, be sure to follow the directions on the label. SEEK MEDICAL CARE IF:  Your weakness gets worse.  You feel your heart pounding or racing.  You are vomiting or having diarrhea.  You are diabetic and having trouble keeping your blood glucose in the normal range. SEEK IMMEDIATE MEDICAL CARE IF:  You have chest pain, shortness of breath, or dizziness.  You are vomiting or having diarrhea for more than 2 days.  You faint. MAKE SURE YOU:   Understand these instructions.  Will watch your condition.  Will get help right away if you are not doing well or get worse. Document  Released: 10/03/2005 Document Revised: 07/24/2013 Document Reviewed: 04/05/2013 [facility] Patient Information 2015 [place], [place]. This information is not intended to replace advice given to you by your health care provider. Make sure you discuss any questions you have with your health care provider.

## 2015-05-25 NOTE — ED Provider Notes (Signed)
CSN: [id number]     Arrival date & time 05/25/15  2032 History   First MD Initiated Contact with Patient 05/25/15 2256     Chief Complaint  Patient presents with  . Shortness of Breath  . Pneumonia     (Consider location/radiation/quality/duration/timing/severity/associated sxs/prior Treatment) HPI   She is here for evaluation of ongoing shortness of breath, despite being treated for pneumonia, about 2 weeks ago. She continues to smoke heavily. She has been confined to wheelchair for about a year because of general weakness. There has been no documented fever. She has mild pain in her abdomen, which gets worse when she struggles to breathe. His been able to eat. She spends most of her time sitting in a recliner, or in her wheelchair. Her granddaughter is with her is helping to care for her. The granddaughter is trying to arrange ongoing management of her care because she is planning on moving back to [place]. There are no other known modifying factors.  Past Medical History  Diagnosis Date  . Hypertension   . GERD (gastroesophageal reflux disease)   . Carpal tunnel syndrome   . Hyperlipidemia   . Osteoarthritis   . COPD (chronic obstructive pulmonary disease)    Past Surgical History  Procedure Laterality Date  . Tonsillectomy    . Knee arthroscopy    . Left carpal tunnel     Family History  Problem Relation Age of Onset  . Cancer Mother   . Cancer Father   . Hypertension Daughter   . Heart disease Daughter     before age 65   History  Substance Use Topics  . Smoking status: Former Smoker -- 0.50 packs/day    Types: Cigarettes    Quit date: 09/16/2009  . Smokeless tobacco: Not on file     Comment: pt has started on e-cig today  . Alcohol Use: No   OB History    No data available     Review of Systems  All other systems reviewed and are negative.     Allergies  Tylenol  Home Medications   Prior to Admission medications   Medication Sig Start Date End Date  Taking? Authorizing Provider  albuterol (PROVENTIL HFA;VENTOLIN HFA) 108 (90 BASE) MCG/ACT inhaler Inhale 1-2 puffs into the lungs every 6 (six) hours as needed for wheezing or shortness of breath.   Yes Historical Provider, MD  albuterol (PROVENTIL) (2.5 MG/3ML) 0.083% nebulizer solution Take 2.5 mg by nebulization every 6 (six) hours as needed for wheezing or shortness of breath.   Yes Historical Provider, MD  aspirin 81 MG tablet Take 81 mg by mouth daily.     Yes Historical Provider, MD  b complex vitamins tablet Take 1 tablet by mouth daily.   Yes Historical Provider, MD  Cholecalciferol 1000 UNITS tablet Take 1,000 Units by mouth every Wednesday.    Yes Historical Provider, MD  cilostazol (PLETAL) 100 MG tablet TAKE 1 TABLET TWICE DAILY. 01/14/13  Yes Fransisco Hertz, MD  cyclobenzaprine (FLEXERIL) 5 MG tablet Take 5 mg by mouth at bedtime.   Yes Historical Provider, MD  Fluticasone-Salmeterol (ADVAIR) 100-50 MCG/DOSE AEPB Inhale 1 puff into the lungs 2 (two) times daily.   Yes Historical Provider, MD  guaiFENesin (MUCINEX) 600 MG 12 hr tablet Take 600 mg by mouth 2 (two) times daily as needed for cough or to loosen phlegm.   Yes Historical Provider, MD  ipratropium-albuterol (DUONEB) 0.5-2.5 (3) MG/3ML SOLN Take 3 mLs by nebulization 4 (four)  times daily. 05/25/15   Mancel Bale, MD  losartan (COZAAR) 50 MG tablet Take 50 mg by mouth every evening.   Yes Historical Provider, MD  meclizine (ANTIVERT) 25 MG tablet Take 25 mg by mouth 3 (three) times daily as needed for dizziness.   Yes Historical Provider, MD  meloxicam (MOBIC) 15 MG tablet Take 15 mg by mouth daily.     Yes Historical Provider, MD  potassium chloride 20 MEQ TBCR Take 10 mEq by mouth daily. 05/25/15   Mancel Bale, MD  simvastatin (ZOCOR) 40 MG tablet Take 40 mg by mouth daily at 6 PM.   Yes Historical Provider, MD  vitamin C (ASCORBIC ACID) 500 MG tablet Take 500 mg by mouth daily.   Yes Historical Provider, MD   BP 122/59 mmHg   Pulse 102  Temp(Src) 97.9 F (36.6 C) (Oral)  Resp 22  SpO2 92% Physical Exam  Constitutional: She is oriented to person, place, and time. She appears well-developed. No distress.  Elderly, frail  HENT:  Head: Normocephalic and atraumatic.  Right Ear: External ear normal.  Left Ear: External ear normal.  Eyes: Conjunctivae and EOM are normal. Pupils are equal, round, and reactive to light.  Neck: Normal range of motion and phonation normal. Neck supple.  Cardiovascular: Normal rate, regular rhythm and normal heart sounds.   Pulmonary/Chest: Effort normal and breath sounds normal. No respiratory distress. She exhibits no bony tenderness.  Decreased hearing bilaterally with scattered wheezing. No rhonchi or rales.  Abdominal: Soft. There is no tenderness.  Musculoskeletal: Normal range of motion. She exhibits edema (Confined feet bilaterally).  Neurological: She is alert and oriented to person, place, and time. No cranial nerve deficit or sensory deficit. She exhibits normal muscle tone. Coordination normal.  Skin: Skin is warm, dry and intact.  Psychiatric: She has a normal mood and affect. Her behavior is normal.  Nursing note and vitals reviewed.   ED Course  Procedures (including critical care time)  Medications  albuterol (PROVENTIL) (2.5 MG/3ML) 0.083% nebulizer solution 5 mg (5 mg Nebulization Not Given 05/25/15 2229)    Patient Vitals for the past 24 hrs:  BP Temp Temp src Pulse Resp SpO2  05/25/15 2230 122/59 mmHg - - 102 22 92 %  05/25/15 2200 121/60 mmHg - - 101 26 92 %  05/25/15 2156 - - - (!) 58 21 -  05/25/15 2155 133/64 mmHg - - - (!) 32 -  05/25/15 2038 176/86 mmHg 97.9 F (36.6 C) Oral 101 24 93 %    She was seen in the ED, by the nurse case manager will helped to arrange in-home services. I wrote for in-home health evaluation by home health service.  Findings discussed with patient and granddaughter, all questions were answered.   Labs Review Labs  Reviewed  CBC WITH DIFFERENTIAL/PLATELET - Abnormal; Notable for the following:    RBC 3.75 (*)    All other components within normal limits  BASIC METABOLIC PANEL - Abnormal; Notable for the following:    Potassium 2.9 (*)    Glucose, Bld 105 (*)    Calcium 8.7 (*)    GFR calc non Af Amer 57 (*)    All other components within normal limits  Rosezena Sensor, ED    Imaging Review Dg Chest 2 View  05/25/2015   CLINICAL DATA:  Shortness of Breath.  Hypertension.  EXAM: CHEST  2 VIEW  COMPARISON:  October 30, 2009  FINDINGS: Lungs are somewhat hyperexpanded but clear. The heart  size is normal. Pulmonary vascularity is within normal limits. Aorta is tortuous with extensive atherosclerotic calcification. There is arthropathy in both shoulders, more severe on the left than on the right.  IMPRESSION: No edema or consolidation. Lungs are somewhat hyperexpanded. Aorta tortuous with atherosclerotic change. This appearance may be reflective of chronic hypertension.   Electronically Signed   By: Bretta Bang III M.D.   On: 05/25/2015 21:44     EKG Interpretation None      MDM   Final diagnoses:  Dyspnea  Weakness  Hypokalemia     Dyspnea related to chronic COPD. No evidence for exacerbation of COPD. Incidental hypokalemia. Weakness is likely multifactorial.   Nursing Notes Reviewed/ Care Coordinated Applicable Imaging Reviewed Interpretation of Laboratory Data incorporated into ED treatment  The patient appears reasonably screened and/or stabilized for discharge and I doubt any other medical condition or other [facility] requiring further screening, evaluation, or treatment in the ED at this time prior to discharge.  Plan: Home Medications- Duoneb, K-Dur; Home Treatments- rest, elevate feet; return here if the recommended treatment, does not improve the symptoms; Recommended follow up- PCP 1 week     Mancel Bale, MD 05/27/15 1330

## 2015-05-25 NOTE — ED Notes (Signed)
Pt reports last week she had positive cxr for pneumonia and is taking a z-pack. Pt has been having exertional sob worse tonight. Pt also c.o pain to lower chest.

## 2015-05-25 NOTE — Care Management Note (Signed)
Case Management Note  Patient Details  Name: Carrie Pena MRN: [id number] Date of Birth: 01/24/1928  Subjective/Objective:   Patient presented to [facility] ED with complaints of SOB, cough, and weakness. Patient is w/c bound. Family is stating it has become increasingly difficult to care for patient at home.             Action/Plan: Recommendations HH services. [facility] services  Expected Discharge Date:      05/25/15           Expected Discharge Plan:  [place]   In-House Referral:     Discharge planning Services  CM Consult  Post Acute Care Choice:  Home Health Choice offered to:  Patient, Adult Children (Clay daughter Lenna Sciara (lives with patient))  DME Arranged:    DME Agency:     HH Arranged:  RN, PT, OT, Nurse's Aide [place] Agency:  [place]  Status of Service:  Completed, signed off  Medicare Important Message Given:    Date Medicare IM Given:    Medicare IM give by:    Date Additional Medicare IM Given:    Additional Medicare Important Message give by:     If discussed at [place] of Stay Meetings, dates discussed:    Additional Comments:  CM consulted concerning family requesting placement, family states it has become increasingly difficult to care for patient. Patient is w/c bound.  Met with patient and Grand Daughter [name] concerning care goals. Discussed with patient and [name] (Granddaughter) patient would need to apply for Medicaid if looking for a higher level of care, patient states, that she has the application however, just have not filed it out.  HH recommendations discussed patient and granddaughter both agreeable with disposition plan. HHRN, PT/OT, HHA,orders placed, offered choice [facility] [place] selected.  THN discussed as well patient  and granddaughter is amendable to this discharge plan. Referral placed for [facility], CM will place referral for [place] in the am. Updated Dr. Eulis Foster on discharge plan he is agreeable. ED CM will  follow up in the am with referral. Laurena Slimmer, RN 05/25/2015, 11:06 PM

## 2015-05-27 NOTE — Care Management (Signed)
ED CM received call from [facility] on a prescription clarification on Potassium. Clarification provided. No further questions or concerns verbalized.

## 2015-06-02 DIAGNOSIS — I10 Essential (primary) hypertension: Secondary | ICD-10-CM | POA: Diagnosis not present

## 2015-06-02 DIAGNOSIS — M179 Osteoarthritis of knee, unspecified: Secondary | ICD-10-CM | POA: Diagnosis not present

## 2015-06-02 DIAGNOSIS — J449 Chronic obstructive pulmonary disease, unspecified: Secondary | ICD-10-CM | POA: Diagnosis not present

## 2015-06-02 DIAGNOSIS — E785 Hyperlipidemia, unspecified: Secondary | ICD-10-CM | POA: Diagnosis not present

## 2015-08-12 ENCOUNTER — Emergency Department (HOSPITAL_COMMUNITY): Payer: Medicare Other

## 2015-08-12 ENCOUNTER — Inpatient Hospital Stay (HOSPITAL_COMMUNITY)
Admission: EM | Admit: 2015-08-12 | Discharge: 2015-08-15 | DRG: 190 | Disposition: A | Discharge status: Skilled Nursing Facility | Payer: Medicare Other | Attending: Internal Medicine | Admitting: Internal Medicine

## 2015-08-12 ENCOUNTER — Encounter (HOSPITAL_COMMUNITY): Payer: Self-pay | Admitting: Emergency Medicine

## 2015-08-12 DIAGNOSIS — M6281 Muscle weakness (generalized): Secondary | ICD-10-CM | POA: Diagnosis not present

## 2015-08-12 DIAGNOSIS — J441 Chronic obstructive pulmonary disease with (acute) exacerbation: Secondary | ICD-10-CM | POA: Diagnosis not present

## 2015-08-12 DIAGNOSIS — R2681 Unsteadiness on feet: Secondary | ICD-10-CM | POA: Diagnosis not present

## 2015-08-12 DIAGNOSIS — Z7901 Long term (current) use of anticoagulants: Secondary | ICD-10-CM | POA: Diagnosis not present

## 2015-08-12 DIAGNOSIS — Z8249 Family history of ischemic heart disease and other diseases of the circulatory system: Secondary | ICD-10-CM | POA: Diagnosis not present

## 2015-08-12 DIAGNOSIS — R0902 Hypoxemia: Secondary | ICD-10-CM | POA: Diagnosis not present

## 2015-08-12 DIAGNOSIS — Z87891 Personal history of nicotine dependence: Secondary | ICD-10-CM

## 2015-08-12 DIAGNOSIS — Z5189 Encounter for other specified aftercare: Secondary | ICD-10-CM | POA: Diagnosis not present

## 2015-08-12 DIAGNOSIS — Z7982 Long term (current) use of aspirin: Secondary | ICD-10-CM

## 2015-08-12 DIAGNOSIS — M79661 Pain in right lower leg: Secondary | ICD-10-CM | POA: Diagnosis not present

## 2015-08-12 DIAGNOSIS — J9601 Acute respiratory failure with hypoxia: Secondary | ICD-10-CM | POA: Diagnosis not present

## 2015-08-12 DIAGNOSIS — I10 Essential (primary) hypertension: Secondary | ICD-10-CM | POA: Diagnosis present

## 2015-08-12 DIAGNOSIS — E785 Hyperlipidemia, unspecified: Secondary | ICD-10-CM | POA: Diagnosis present

## 2015-08-12 DIAGNOSIS — J9621 Acute and chronic respiratory failure with hypoxia: Secondary | ICD-10-CM | POA: Diagnosis present

## 2015-08-12 DIAGNOSIS — M7989 Other specified soft tissue disorders: Secondary | ICD-10-CM | POA: Diagnosis not present

## 2015-08-12 DIAGNOSIS — J189 Pneumonia, unspecified organism: Secondary | ICD-10-CM | POA: Diagnosis present

## 2015-08-12 DIAGNOSIS — R0602 Shortness of breath: Secondary | ICD-10-CM | POA: Diagnosis not present

## 2015-08-12 DIAGNOSIS — J45909 Unspecified asthma, uncomplicated: Secondary | ICD-10-CM | POA: Diagnosis present

## 2015-08-12 DIAGNOSIS — Z809 Family history of malignant neoplasm, unspecified: Secondary | ICD-10-CM

## 2015-08-12 DIAGNOSIS — J9690 Respiratory failure, unspecified, unspecified whether with hypoxia or hypercapnia: Secondary | ICD-10-CM | POA: Diagnosis not present

## 2015-08-12 LAB — BLOOD GAS, VENOUS
Acid-Base Excess: 2 mmol/L (ref 0.0–2.0)
BICARBONATE: 26.2 meq/L — AB (ref 20.0–24.0)
FIO2: 0.21
O2 Saturation: 80.9 %
PATIENT TEMPERATURE: 98.6
TCO2: 23.8 mmol/L (ref 0–100)
pCO2, Ven: 41.9 mmHg — ABNORMAL LOW (ref 45.0–50.0)
pH, Ven: 7.413 — ABNORMAL HIGH (ref 7.250–7.300)
pO2, Ven: 46.7 mmHg — ABNORMAL HIGH (ref 30.0–45.0)

## 2015-08-12 LAB — BASIC METABOLIC PANEL
ANION GAP: 7 (ref 5–15)
BUN: 6 mg/dL (ref 6–20)
CHLORIDE: 101 mmol/L (ref 101–111)
CO2: 27 mmol/L (ref 22–32)
Calcium: 9 mg/dL (ref 8.9–10.3)
Creatinine, Ser: 0.69 mg/dL (ref 0.44–1.00)
GFR calc Af Amer: >60 mL/min (ref >60)
GFR calc non Af Amer: >60 mL/min (ref >60)
GLUCOSE: 107 mg/dL — AB (ref 65–99)
POTASSIUM: 3.4 mmol/L — AB (ref 3.5–5.1)
Sodium: 135 mmol/L (ref 135–145)

## 2015-08-12 LAB — CBC
HCT: 34.1 % — ABNORMAL LOW (ref 36.0–46.0)
HEMOGLOBIN: 11.2 g/dL — AB (ref 12.0–15.0)
MCH: 32.5 pg (ref 26.0–34.0)
MCHC: 32.8 g/dL (ref 30.0–36.0)
MCV: 98.8 fL (ref 78.0–100.0)
Platelets: 228 10*3/uL (ref 150–400)
RBC: 3.45 MIL/uL — ABNORMAL LOW (ref 3.87–5.11)
RDW: 14.5 % (ref 11.5–15.5)
WBC: 8 10*3/uL (ref 4.0–10.5)

## 2015-08-12 LAB — TSH: TSH: 1.039 u[IU]/mL (ref 0.350–4.500)

## 2015-08-12 LAB — I-STAT TROPONIN, ED: Troponin i, poc: 0.01 ng/mL (ref 0.00–0.08)

## 2015-08-12 MED ORDER — IPRATROPIUM BROMIDE 0.02 % IN SOLN: 0.5000 mg | Freq: Once | RESPIRATORY_TRACT | Status: DC

## 2015-08-12 MED ORDER — CYCLOBENZAPRINE HCL 5 MG PO TABS
5.0000 mg | ORAL_TABLET | Freq: Three times a day (TID) | ORAL | Status: DC | PRN
  Administered 2015-08-13: 5 mg via ORAL
  Filled 2015-08-12: qty 1

## 2015-08-12 MED ORDER — ASPIRIN EC 81 MG PO TBEC
81.0000 mg | DELAYED_RELEASE_TABLET | Freq: Every day | ORAL | Status: DC
  Administered 2015-08-13 – 2015-08-15 (×3): 81 mg via ORAL
  Filled 2015-08-12 (×3): qty 1

## 2015-08-12 MED ORDER — ALBUTEROL (5 MG/ML) CONTINUOUS INHALATION SOLN
10.0000 mg/h | INHALATION_SOLUTION | Freq: Once | RESPIRATORY_TRACT | Status: AC
  Administered 2015-08-12: 10 mg/h via RESPIRATORY_TRACT
  Filled 2015-08-12: qty 20

## 2015-08-12 MED ORDER — ONDANSETRON HCL 4 MG/2ML IJ SOLN: 4.0000 mg | Freq: Four times a day (QID) | INTRAMUSCULAR | Status: DC | PRN

## 2015-08-12 MED ORDER — IPRATROPIUM-ALBUTEROL 0.5-2.5 (3) MG/3ML IN SOLN
3.0000 mL | Freq: Four times a day (QID) | RESPIRATORY_TRACT | Status: DC
  Administered 2015-08-13 – 2015-08-15 (×8): 3 mL via RESPIRATORY_TRACT
  Filled 2015-08-12 (×11): qty 3

## 2015-08-12 MED ORDER — IPRATROPIUM-ALBUTEROL 0.5-2.5 (3) MG/3ML IN SOLN: 3.0000 mL | Freq: Once | RESPIRATORY_TRACT | Status: DC

## 2015-08-12 MED ORDER — SODIUM CHLORIDE 0.9 % IJ SOLN
3.0000 mL | Freq: Two times a day (BID) | INTRAMUSCULAR | Status: DC
  Administered 2015-08-13 – 2015-08-15 (×3): 3 mL via INTRAVENOUS

## 2015-08-12 MED ORDER — SIMVASTATIN 40 MG PO TABS
40.0000 mg | ORAL_TABLET | Freq: Every day | ORAL | Status: DC
  Administered 2015-08-13: 40 mg via ORAL
  Filled 2015-08-12: qty 1

## 2015-08-12 MED ORDER — IPRATROPIUM-ALBUTEROL 0.5-2.5 (3) MG/3ML IN SOLN
3.0000 mL | Freq: Once | RESPIRATORY_TRACT | Status: AC
  Administered 2015-08-12: 3 mL via RESPIRATORY_TRACT
  Filled 2015-08-12: qty 3

## 2015-08-12 MED ORDER — CILOSTAZOL 100 MG PO TABS
100.0000 mg | ORAL_TABLET | Freq: Two times a day (BID) | ORAL | Status: DC
  Administered 2015-08-12 – 2015-08-15 (×6): 100 mg via ORAL
  Filled 2015-08-12 (×6): qty 1

## 2015-08-12 MED ORDER — AZITHROMYCIN 250 MG PO TABS
500.0000 mg | ORAL_TABLET | Freq: Once | ORAL | Status: AC
  Administered 2015-08-12: 500 mg via ORAL
  Filled 2015-08-12: qty 2

## 2015-08-12 MED ORDER — SODIUM CHLORIDE 0.9 % IV SOLN: 250.0000 mL | INTRAVENOUS | Status: DC | PRN

## 2015-08-12 MED ORDER — SODIUM CHLORIDE 0.9 % IJ SOLN
3.0000 mL | Freq: Two times a day (BID) | INTRAMUSCULAR | Status: DC
  Administered 2015-08-14: 3 mL via INTRAVENOUS

## 2015-08-12 MED ORDER — ENOXAPARIN SODIUM 40 MG/0.4ML ~~LOC~~ SOLN
40.0000 mg | SUBCUTANEOUS | Status: DC
  Administered 2015-08-12 – 2015-08-14 (×3): 40 mg via SUBCUTANEOUS
  Filled 2015-08-12 (×3): qty 0.4

## 2015-08-12 MED ORDER — DEXTROSE 5 % IV SOLN
500.0000 mg | INTRAVENOUS | Status: DC
  Administered 2015-08-13: 500 mg via INTRAVENOUS
  Filled 2015-08-12: qty 500

## 2015-08-12 MED ORDER — SODIUM CHLORIDE 0.9 % IJ SOLN
3.0000 mL | INTRAMUSCULAR | Status: DC | PRN
  Administered 2015-08-14: 3 mL via INTRAVENOUS
  Filled 2015-08-12: qty 3

## 2015-08-12 MED ORDER — ALUM & MAG HYDROXIDE-SIMETH 200-200-20 MG/5ML PO SUSP
30.0000 mL | Freq: Four times a day (QID) | ORAL | Status: DC | PRN
  Administered 2015-08-14: 30 mL via ORAL
  Filled 2015-08-12: qty 30

## 2015-08-12 MED ORDER — IPRATROPIUM BROMIDE 0.02 % IN SOLN
RESPIRATORY_TRACT | Status: AC
  Administered 2015-08-12: 0.5 mg
  Filled 2015-08-12: qty 2.5

## 2015-08-12 MED ORDER — ONDANSETRON HCL 4 MG PO TABS: 4.0000 mg | ORAL_TABLET | Freq: Four times a day (QID) | ORAL | Status: DC | PRN

## 2015-08-12 MED ORDER — DEXTROSE 5 % IV SOLN
1.0000 g | INTRAVENOUS | Status: DC
  Administered 2015-08-12 – 2015-08-13 (×2): 1 g via INTRAVENOUS
  Filled 2015-08-12 (×2): qty 10

## 2015-08-12 MED ORDER — LOSARTAN POTASSIUM 50 MG PO TABS
50.0000 mg | ORAL_TABLET | Freq: Every evening | ORAL | Status: DC
  Filled 2015-08-12: qty 1

## 2015-08-12 MED ORDER — GUAIFENESIN ER 600 MG PO TB12: 600.0000 mg | ORAL_TABLET | Freq: Two times a day (BID) | ORAL | Status: DC | PRN

## 2015-08-12 MED ORDER — IPRATROPIUM-ALBUTEROL 0.5-2.5 (3) MG/3ML IN SOLN
3.0000 mL | Freq: Four times a day (QID) | RESPIRATORY_TRACT | Status: DC
  Administered 2015-08-12: 3 mL via RESPIRATORY_TRACT
  Filled 2015-08-12: qty 3

## 2015-08-12 MED ORDER — METHYLPREDNISOLONE SODIUM SUCC 125 MG IJ SOLR
60.0000 mg | Freq: Four times a day (QID) | INTRAMUSCULAR | Status: DC
  Administered 2015-08-12 – 2015-08-14 (×7): 60 mg via INTRAVENOUS
  Filled 2015-08-12 (×7): qty 2

## 2015-08-12 MED ORDER — MOMETASONE FURO-FORMOTEROL FUM 100-5 MCG/ACT IN AERO
2.0000 | INHALATION_SPRAY | Freq: Two times a day (BID) | RESPIRATORY_TRACT | Status: DC
  Administered 2015-08-12 – 2015-08-15 (×6): 2 via RESPIRATORY_TRACT
  Filled 2015-08-12: qty 8.8

## 2015-08-12 NOTE — ED Provider Notes (Signed)
MSE was initiated and I personally evaluated the patient and placed orders (if any) at  2:40 PM on August 12, 2015.  Pt presents with shortness of breath that started last evening and has progressed today.  She has history of COPD and asthma.  The sx were more severe today  The patient appears stable so that the remainder of the MSE may be completed by another provider.  Linwood DibblesJon Izel Eisenhardt, MD 08/13/15 [phone]

## 2015-08-12 NOTE — ED Notes (Signed)
Breathing treatment in progress.  Pt is in no acute distress at this time and denies needs at present.

## 2015-08-12 NOTE — ED Notes (Signed)
[facility] Expected date:  Expected time:  Means of arrival:  Comments: EMS- 81yo F, SOB

## 2015-08-12 NOTE — ED Notes (Signed)
Per EMS: Pt from home.  C/o SHOB since last night.  Hx of COPD and asthma.  Given duoneb and 125 solumedrol en route.  18 g in lt hand.  Pt states that she feels better on neb.

## 2015-08-12 NOTE — Progress Notes (Signed)
Pt called out to ED CM while CM at nursing station Pt requested to go to bathroom States she has difficulty walking and moving without a w/c CM offered to place her on bed pan CM went to place he on bed pan but noted she had already voided incontinently CM notified nursing of pt incontinence episode. Discussed with pt she can be assisted to get changed Pt states "I'm sorry.  Sometimes I don't know I have done it"

## 2015-08-12 NOTE — ED Provider Notes (Addendum)
CSN: [id number]     Arrival date & time 08/12/15  1417 History   First MD Initiated Contact with Patient 08/12/15 1435     Chief Complaint  Patient presents with  . Shortness of Breath     (Consider location/radiation/quality/duration/timing/severity/associated sxs/prior Treatment) HPI 81 year old female with history of COPD, HTN, HLD who presents with SOB. No home oxygen. Short of breath a few days now, not improved with home breathing treatments. Worse over last evening. Currently not smoking tobacco anymore. Unchanged baseline cough with sputum, but feels more congested with runny nose. No fevers, chills, chest pain. Reports some pedal edema, but no prior history of CHF or heart disease. EMS called today, and received duoneb and 125 mg of solumedrol.  Past Medical History  Diagnosis Date  . Hypertension   . GERD (gastroesophageal reflux disease)   . Carpal tunnel syndrome   . Hyperlipidemia   . Osteoarthritis   . COPD (chronic obstructive pulmonary disease) [facility](HCC)    Past Surgical History  Procedure Laterality Date  . Tonsillectomy    . Knee arthroscopy    . Left carpal tunnel     Family History  Problem Relation Age of Onset  . Cancer Mother   . Cancer Father   . Hypertension Daughter   . Heart disease Daughter     before age 79   Social History  Substance Use Topics  . Smoking status: Former Smoker -- 0.50 packs/day    Types: Cigarettes    Quit date: 09/16/2009  . Smokeless tobacco: None     Comment: pt has started on e-cig today  . Alcohol Use: No   OB History    No data available     Review of Systems 10/14 systems reviewed and are negative other than those stated in the HPI    Allergies  Tylenol  Home Medications   Prior to Admission medications   Medication Sig Start Date End Date Taking? Authorizing Provider  albuterol (PROVENTIL HFA;VENTOLIN HFA) 108 (90 BASE) MCG/ACT inhaler Inhale 1-2 puffs into the lungs every 6 (six) hours as needed for  wheezing or shortness of breath.   Yes Historical Provider, MD  aspirin 81 MG tablet Take 81 mg by mouth daily.     Yes Historical Provider, MD  b complex vitamins tablet Take 1 tablet by mouth daily.   Yes Historical Provider, MD  Cholecalciferol 1000 UNITS tablet Take 1,000 Units by mouth every Wednesday.    Yes Historical Provider, MD  cilostazol (PLETAL) 100 MG tablet TAKE 1 TABLET TWICE DAILY. 01/14/13  Yes Fransisco HertzBrian L Chen, MD  cyclobenzaprine (FLEXERIL) 5 MG tablet Take 5 mg by mouth 3 (three) times daily as needed for muscle spasms.    Yes Historical Provider, MD  Fluticasone-Salmeterol (ADVAIR) 100-50 MCG/DOSE AEPB Inhale 1 puff into the lungs 2 (two) times daily.   Yes Historical Provider, MD  guaiFENesin (MUCINEX) 600 MG 12 hr tablet Take 600 mg by mouth 2 (two) times daily as needed for cough or to loosen phlegm.   Yes Historical Provider, MD  ibandronate (BONIVA) 150 MG tablet Take 150 mg by mouth every 30 (thirty) days. Take in the morning with a full glass of water, on an empty stomach, and do not take anything else by mouth or lie down for the next 30 min.   Yes Historical Provider, MD  ipratropium-albuterol (DUONEB) 0.5-2.5 (3) MG/3ML SOLN Take 3 mLs by nebulization 4 (four) times daily. 05/25/15  Yes Mancel BaleElliott Wentz, MD  losartan (COZAAR) 50 MG tablet Take 50 mg by mouth every evening.   Yes Historical Provider, MD  meclizine (ANTIVERT) 25 MG tablet Take 25 mg by mouth 3 (three) times daily as needed for dizziness.   Yes Historical Provider, MD  meloxicam (MOBIC) 15 MG tablet Take 15 mg by mouth daily as needed for pain.    Yes Historical Provider, MD  naproxen sodium (ANAPROX) 220 MG tablet Take 220 mg by mouth every 8 (eight) hours as needed (For pain.).   Yes Historical Provider, MD  simvastatin (ZOCOR) 40 MG tablet Take 40 mg by mouth daily at 6 PM.   Yes Historical Provider, MD  vitamin C (ASCORBIC ACID) 500 MG tablet Take 500 mg by mouth daily.   Yes Historical Provider, MD  potassium  chloride 20 MEQ TBCR Take 10 mEq by mouth daily. Patient not taking: Reported on 08/12/2015 05/25/15   Mancel Bale, MD   BP 159/74 mmHg  Pulse 95  Temp(Src) 98.7 F (37.1 C) (Oral)  Resp 23  SpO2 89% Physical Exam Physical Exam  Nursing note and vitals reviewed. Constitutional: Well developed, well nourished, non-toxic, and in no acute distress Head: Normocephalic and atraumatic.  Mouth/Throat: Oropharynx is clear and moist.  Neck: Normal range of motion. Neck supple.  Cardiovascular: Normal rate and regular rhythm.  Bilateral symmetric lower extremity edema. Pulmonary/Chest: Mild tachypnea. No accessory muscle usage. No conversational dyspnea. Diffuse expiratory wheezing throughout, with congested cough. Abdominal: Soft. There is no tenderness. There is no rebound and no guarding.  Musculoskeletal: Normal range of motion.  Neurological: Alert, no facial droop, fluent speech, moves all extremities symmetrically Skin: Skin is warm and dry.  Psychiatric: Cooperative  ED Course  Procedures (including critical care time) Labs Review Labs Reviewed  BASIC METABOLIC PANEL - Abnormal; Notable for the following:    Potassium 3.4 (*)    Glucose, Bld 107 (*)    All other components within normal limits  CBC - Abnormal; Notable for the following:    RBC 3.45 (*)    Hemoglobin 11.2 (*)    HCT 34.1 (*)    All other components within normal limits  BLOOD GAS, VENOUS - Abnormal; Notable for the following:    pH, Ven 7.413 (*)    pCO2, Ven 41.9 (*)    pO2, Ven 46.7 (*)    Bicarbonate 26.2 (*)    All other components within normal limits  BRAIN NATRIURETIC PEPTIDE  I-STAT TROPOININ, ED    Imaging Review Dg Chest Port 1 View  08/12/2015  CLINICAL DATA:  81 year old female shortness of breath. Former smoker. COPD. Initial encounter. EXAM: PORTABLE CHEST 1 VIEW COMPARISON:  88,016 and earlier. FINDINGS: Portable AP semi upright view at 1459 hours. Stable tortuous and calcified thoracic  aorta. Stable cardiac size and mediastinal contours. Stable hyperinflation. No pneumothorax, pulmonary edema, pleural effusion or confluent pulmonary opacity. Degenerative changes Re identified at both shoulders. IMPRESSION: Stable chronic lung disease. No superimposed acute findings are identified. Electronically Signed   By: Odessa Fleming M.D.   On: 08/12/2015 15:10   I have personally reviewed and evaluated these images and lab results as part of my medical decision-making.   EKG Interpretation   Date/Time:  Wednesday August 12 2015 14:44:37 EDT Ventricular Rate:  96 PR Interval:  131 QRS Duration: 80 QT Interval:  374 QTC Calculation: 473 R Axis:   50 Text Interpretation:  Sinus rhythm Ventricular premature complex Right  atrial enlargement Anteroseptal infarct, old Minimal ST depression,  lateral leads No significant change since last tracing Confirmed by KNAPP   MD-J, JON ([id number]) on 08/12/2015 2:54:15 PM     CRITICAL CARE Performed by: Lavera Guise   Total critical care time: 30 minutes  Critical care time was exclusive of separately billable procedures and treating other patients.  Critical care was necessary to treat or prevent imminent or life-threatening deterioration.  Critical care was time spent personally by me on the following activities: development of treatment plan with patient and/or surrogate as well as nursing, discussions with consultants, evaluation of patient's response to treatment, examination of patient, obtaining history from patient or surrogate, ordering and performing treatments and interventions, ordering and review of laboratory studies, ordering and review of radiographic studies, pulse oximetry and re-evaluation of patient's condition.  MDM   Final diagnoses:  COPD exacerbation (HCC)  Hypoxia    81 year old female with history of COPD, hypertension, hyperlipidemia who presents with progressively worsening shortness of breath over the past few days.  On arrival she is receiving a breathing treatment, has tachypnea but no conversational dyspnea sensory muscle use. Diffuse inspiratory and expiratory wheezing noted. LE edema noted, but otherwise does not appear fluid overloaded and no prior history of CHF. She was initially placed on 1 hour of continuous albuterol, and with subjective improvement in symptoms. Continues to have 2 L oxygen requirement here, which is new. Continues to have diffuse wheezing. chest x-ray without infiltrate, edema or other acute cardiopulmonary processes. EKG is not acutely ischemic, and troponin is negative. has received steroids and azithromycin for COPD exacerbation. Will admit for COPD exacerbation.     Lavera Guise, MD 08/12/15 1730  Lavera Guise, MD 08/12/15 [phone]

## 2015-08-12 NOTE — H&P (Addendum)
[facility] History and Physical  Carrie DollyJeanne R Byas WUJ:[id number] DOB: 1928/10/04 DOA: 08/12/2015  Referring physician:  PCP: [name],[name], MD   Chief Complaint: Cough/shortness of breath  HPI: Carrie Pena is a 81 y.o. female with a past medical history of chronic affective pulmonary disease, hypertension, dyslipidemia, presenting to the emergency department with complaints of shortness of breath associate with cough that started 4 days ago. She reports progressively worsening shortness of breath becoming severe overnight. This is associate with wheezing, cough having sputum production as well as congestion. She denies fevers chills nausea vomiting. She called EMS due to worsening of symptoms. In the emergency room she was treated with IV steroids, nebulizer treatments along with antibiotic therapy. Initial chest x-ray showed stable chronic lung changes.                            Review of Systems:  Constitutional:  No weight loss, night sweats, Fevers, chills, fatigue.  HEENT:  No headaches, Difficulty swallowing,Tooth/dental problems,Sore throat,  No sneezing, itching, ear ache, nasal congestion, post nasal drip,  Cardio-vascular:  No chest pain, Orthopnea, PND, swelling in lower extremities, anasarca, dizziness, palpitations  GI:  No heartburn, indigestion, abdominal pain, nausea, vomiting, diarrhea, change in bowel habits, loss of appetite  Resp:  Positive for shortness of breath with exertion or at rest. No excess mucus, positive for productive cough, No non-productive cough, No coughing up of blood.No change in color of mucus positive for wheezing.No chest wall deformity  Skin:  no rash or lesions.  GU:  no dysuria, change in color of urine, no urgency or frequency. No flank pain.  Musculoskeletal:  No joint pain or swelling. No decreased range of motion. No back pain.  Psych:  No change in mood or affect. No depression or anxiety. No memory loss.   Past Medical  History  Diagnosis Date  . Hypertension   . GERD (gastroesophageal reflux disease)   . Carpal tunnel syndrome   . Hyperlipidemia   . Osteoarthritis   . COPD (chronic obstructive pulmonary disease) [facility](HCC)    Past Surgical History  Procedure Laterality Date  . Tonsillectomy    . Knee arthroscopy    . Left carpal tunnel     Social History:  reports that she quit smoking about 5 years ago. Her smoking use included Cigarettes. She smoked 0.50 packs per day. She does not have any smokeless tobacco history on file. She reports that she does not drink alcohol or use illicit drugs.  Allergies  Allergen Reactions  . Tylenol [Acetaminophen] Other (See Comments)    Pt states "It makes me shake"    Family History  Problem Relation Age of Onset  . Cancer Mother   . Cancer Father   . Hypertension Daughter   . Heart disease Daughter     before age 79     Prior to Admission medications   Medication Sig Start Date End Date Taking? Authorizing Provider  albuterol (PROVENTIL HFA;VENTOLIN HFA) 108 (90 BASE) MCG/ACT inhaler Inhale 1-2 puffs into the lungs every 6 (six) hours as needed for wheezing or shortness of breath.   Yes Historical Provider, MD  aspirin 81 MG tablet Take 81 mg by mouth daily.     Yes Historical Provider, MD  b complex vitamins tablet Take 1 tablet by mouth daily.   Yes Historical Provider, MD  Cholecalciferol 1000 UNITS tablet Take 1,000 Units by mouth every Wednesday.    Yes  Historical Provider, MD  cilostazol (PLETAL) 100 MG tablet TAKE 1 TABLET TWICE DAILY. 01/14/13  Yes Fransisco Hertz, MD  cyclobenzaprine (FLEXERIL) 5 MG tablet Take 5 mg by mouth 3 (three) times daily as needed for muscle spasms.    Yes Historical Provider, MD  Fluticasone-Salmeterol (ADVAIR) 100-50 MCG/DOSE AEPB Inhale 1 puff into the lungs 2 (two) times daily.   Yes Historical Provider, MD  guaiFENesin (MUCINEX) 600 MG 12 hr tablet Take 600 mg by mouth 2 (two) times daily as needed for cough or to loosen  phlegm.   Yes Historical Provider, MD  ibandronate (BONIVA) 150 MG tablet Take 150 mg by mouth every 30 (thirty) days. Take in the morning with a full glass of water, on an empty stomach, and do not take anything else by mouth or lie down for the next 30 min.   Yes Historical Provider, MD  ipratropium-albuterol (DUONEB) 0.5-2.5 (3) MG/3ML SOLN Take 3 mLs by nebulization 4 (four) times daily. 05/25/15  Yes Mancel Bale, MD  losartan (COZAAR) 50 MG tablet Take 50 mg by mouth every evening.   Yes Historical Provider, MD  meclizine (ANTIVERT) 25 MG tablet Take 25 mg by mouth 3 (three) times daily as needed for dizziness.   Yes Historical Provider, MD  meloxicam (MOBIC) 15 MG tablet Take 15 mg by mouth daily as needed for pain.    Yes Historical Provider, MD  naproxen sodium (ANAPROX) 220 MG tablet Take 220 mg by mouth every 8 (eight) hours as needed (For pain.).   Yes Historical Provider, MD  simvastatin (ZOCOR) 40 MG tablet Take 40 mg by mouth daily at 6 PM.   Yes Historical Provider, MD  vitamin C (ASCORBIC ACID) 500 MG tablet Take 500 mg by mouth daily.   Yes Historical Provider, MD  potassium chloride 20 MEQ TBCR Take 10 mEq by mouth daily. Patient not taking: Reported on 08/12/2015 05/25/15   Mancel Bale, MD   Physical Exam: Filed Vitals:   08/12/15 1549 08/12/15 1600 08/12/15 1627 08/12/15 1630  BP: 169/81 170/71 170/71 159/74  Pulse: 102 96 99 95  Temp:      TempSrc:      Resp: SpO2: 96% 91% 92% 89%    Wt Readings from Last 3 Encounters:  05/22/14 65.772 kg (145 lb)  05/06/14 65.772 kg (145 lb)  07/18/11 65.772 kg (145 lb)    General:  She is awake and alert, having audible wheezing, able to participate in physical examination. Eyes: PERRL, normal lids, irises & conjunctiva ENT: grossly normal hearing, lips & tongue, dry oral mucosa Neck: no LAD, masses or thyromegaly Cardiovascular: RRR, no m/r/g. No LE edema., Tachycardic, having 2+ bilateral extremity pitting  edema Telemetry: SR, no arrhythmias  Respiratory: She has diminished breath sounds bilaterally with extensive bilateral expiratory wheezing and rhonchi Abdomen: soft, ntnd Skin: no rash or induration seen on limited exam Musculoskeletal: grossly normal tone BUE/BLE Psychiatric: grossly normal mood and affect, speech fluent and appropriate Neurologic: grossly non-focal.          Labs on Admission:  Basic Metabolic Panel:  Recent Labs Lab 08/12/15 1459  NA 135  K 3.4*  CL 101  CO2 27  GLUCOSE 107*  BUN 6  CREATININE 0.69  CALCIUM 9.0   Liver Function Tests: No results for input(s): AST, ALT, ALKPHOS, BILITOT, PROT, ALBUMIN in the last 168 hours. No results for input(s): LIPASE, AMYLASE in the last 168 hours. No results for input(s): AMMONIA  in the last 168 hours. CBC:  Recent Labs Lab 08/12/15 1459  WBC 8.0  HGB 11.2*  HCT 34.1*  MCV 98.8  PLT 228   Cardiac Enzymes: No results for input(s): CKTOTAL, CKMB, CKMBINDEX, TROPONINI in the last 168 hours.  BNP (last 3 results) No results for input(s): BNP in the last 8760 hours.  ProBNP (last 3 results) No results for input(s): PROBNP in the last 8760 hours.  CBG: No results for input(s): GLUCAP in the last 168 hours.  Radiological Exams on Admission: Dg Chest Port 1 View  08/12/2015  CLINICAL DATA:  81 year old female shortness of breath. Former smoker. COPD. Initial encounter. EXAM: PORTABLE CHEST 1 VIEW COMPARISON:  88,016 and earlier. FINDINGS: Portable AP semi upright view at 1459 hours. Stable tortuous and calcified thoracic aorta. Stable cardiac size and mediastinal contours. Stable hyperinflation. No pneumothorax, pulmonary edema, pleural effusion or confluent pulmonary opacity. Degenerative changes Re identified at both shoulders. IMPRESSION: Stable chronic lung disease. No superimposed acute findings are identified. Electronically Signed   By: Odessa Fleming M.D.   On: 08/12/2015 15:10    EKG: Independently  reviewed.   Assessment/Plan Principal Problem:   COPD exacerbation (HCC) Active Problems:   Respiratory failure (HCC)   CAP (community acquired pneumonia)   1. Chronic obstructive pulmonary disease exacerbation. Patient with history of tobacco abuse presenting to the emergency department with complaints of worsening shortness of breath that is associated with cough, wheezing, sputum production. Initial workup in the emergency room included a chest x-ray that did not show acute infiltrate. It is possible though that underlying  infectious process may have precipitated event. Will admit to telemetry, continue systemic steroids with Solu-Medrol at 60 mg IV every 6 hours along with scheduled duo nebs and empiric IV antibiotic therapy with ceftriaxone and azithromycin.  2. Acute hypoxemic respiratory failure. Evidence by a respiratory rate of 27 having O2 sat of 89% this is likely secondary to acute COPD exacerbation. At baseline she does not require submental oxygen.  Will admit to the inpatient service, provide systemic steroids, nebulizer treatments, antimicrobial therapy. She showed improvement after receiving nebs in the emergency department.  3.  Possible community acquire pneumonia. It is possible infection may have precipitated COPD exacerbation. Will cover with antimicrobial therapy; ceftriaxone 1 g IV every 20 filed as an azithromycin 500 mg IV every 24 hours. 4. Hypertension. Patient having elevated blood pressures in the emergency room which could be related to anxiety and respiratory failure. We'll continue losartan at 50 mg by mouth daily. 5. Dyslipidemia. Continue Zocor 40 mg by mouth daily 6. DVT prophylaxis. Lovenox   Code Status: Code status discussed with patient, she is a DNR  Family Communication: Family not present Disposition Plan: Admit to inpatient service, anticipate she may require greater than 2 nights hospitalization   Time spent: 65 min  Jeralyn Bennett [facility] Pager [phone]

## 2015-08-13 LAB — BASIC METABOLIC PANEL
ANION GAP: 6 (ref 5–15)
BUN: 15 mg/dL (ref 6–20)
CALCIUM: 9 mg/dL (ref 8.9–10.3)
CO2: 28 mmol/L (ref 22–32)
Chloride: 101 mmol/L (ref 101–111)
Creatinine, Ser: 0.93 mg/dL (ref 0.44–1.00)
GFR, EST NON AFRICAN AMERICAN: 54 mL/min — AB (ref >60)
Glucose, Bld: 168 mg/dL — ABNORMAL HIGH (ref 65–99)
POTASSIUM: 3.6 mmol/L (ref 3.5–5.1)
Sodium: 135 mmol/L (ref 135–145)

## 2015-08-13 LAB — CBC
HEMATOCRIT: 32.4 % — AB (ref 36.0–46.0)
HEMOGLOBIN: 10.4 g/dL — AB (ref 12.0–15.0)
MCH: 32 pg (ref 26.0–34.0)
MCHC: 32.1 g/dL (ref 30.0–36.0)
MCV: 99.7 fL (ref 78.0–100.0)
Platelets: 237 10*3/uL (ref 150–400)
RBC: 3.25 MIL/uL — AB (ref 3.87–5.11)
RDW: 14.5 % (ref 11.5–15.5)
WBC: 7.8 10*3/uL (ref 4.0–10.5)

## 2015-08-13 MED ORDER — IPRATROPIUM-ALBUTEROL 0.5-2.5 (3) MG/3ML IN SOLN
3.0000 mL | RESPIRATORY_TRACT | Status: DC | PRN
  Administered 2015-08-13 – 2015-08-14 (×2): 3 mL via RESPIRATORY_TRACT
  Filled 2015-08-13: qty 3

## 2015-08-13 MED ORDER — LOSARTAN POTASSIUM 50 MG PO TABS
100.0000 mg | ORAL_TABLET | Freq: Every evening | ORAL | Status: DC
  Administered 2015-08-13 – 2015-08-14 (×2): 100 mg via ORAL
  Filled 2015-08-13: qty 2

## 2015-08-13 MED ORDER — NICOTINE 21 MG/24HR TD PT24
21.0000 mg | MEDICATED_PATCH | Freq: Every day | TRANSDERMAL | Status: DC
  Administered 2015-08-13: 21 mg via TRANSDERMAL
  Filled 2015-08-13: qty 1

## 2015-08-13 NOTE — Progress Notes (Signed)
[facility] PROGRESS NOTE  Marsh DollyJeanne R Hallenbeck ZOX:[id number] DOB: 04/26/1928 DOA: 08/12/2015 PCP: [name],[name], MD  Assessment/Plan: 1. Chronic obstructive pulmonary disease exacerbation -Patient resented with complaints of worsening shortness of breath associated with cough and wheezing. Initial workup in the emergency department included a chest x-ray which did not show acute infiltrate. -On exam today she continues to have bilateral exit tori wheezing for which I'll continue Solu-Medrol at 60 min grams IV every 6 hours along with scheduled duo nebs -Continue empiric IV antibiotic therapy with ceftriaxone and azithromycin  2.  Acute hypoxemic respiratory failure -Present on admission evidence by an O2 sat of 89% with respiratory to 27 -Likely caused by COPD exacerbation -On room air she maintain sats of 89-90%. He reports not using supplemental oxygen at home. -We'll continue treating COPD exacerbation and possible community acquire pneumonia.  3.  Community-acquired pneumonia. -Possible infectious process may have precipitated COPD exacerbation. -She was covered with IV ceftriaxone and azithromycin.  4.  Hypertension -Blood pressures remain elevated over the course of the day -Will increase losartan from 50 mg to 100 mg by mouth daily.  Code Status: Full Code Family Communication: I spoke to his daughter over telephone conversation Disposition Plan: Pending PT Eval   Consultants:  Physical therapy   Antibiotics:  Ceftriaxone  Azithromycin  HPI/Subjective: Patient is a pleasant 81 year old female with a history of chronic obstructive pulmonary disease, hypertension, cognitive impairment admitted to medicine service on 08/12/2015 when she presented with complaints of shortness of breath associate with cough and wheezing. Symptoms felt to be secondary to COPD exacerbation. She was started on systemic steroids, empiric IV antibiotic therapy, duo  nebs.  Objective: Filed Vitals:   08/13/15 1343  BP: 165/81  Pulse: 105  Temp: 98.6 F (37 C)  Resp: 24    Intake/Output Summary (Last 24 hours) at 08/13/15 1753 Last data filed at 08/13/15 1700  Gross per 24 hour  Intake    600 ml  Output    750 ml  Net   -150 ml   Filed Weights   08/12/15 1939  Weight: 58.378 kg (128 lb 11.2 oz)    Exam:   General:  Patient is in no acute distress, she is mild confused, asking to go home today.  Cardiovascular: Regular rate rhythm normal S1-S2  Respiratory: Diminished breath sounds with extensive bilateral expiratory wheezing  Abdomen: Soft nontender nondistended  Musculoskeletal: No edema  Data Reviewed: Basic Metabolic Panel:  Recent Labs Lab 08/12/15 1459 08/13/15 0520  NA 135 135  K 3.4* 3.6  CL 101 101  CO2 27 28  GLUCOSE 107* 168*  BUN 6 15  CREATININE 0.69 0.93  CALCIUM 9.0 9.0   Liver Function Tests: No results for input(s): AST, ALT, ALKPHOS, BILITOT, PROT, ALBUMIN in the last 168 hours. No results for input(s): LIPASE, AMYLASE in the last 168 hours. No results for input(s): AMMONIA in the last 168 hours. CBC:  Recent Labs Lab 08/12/15 1459 08/13/15 0520  WBC 8.0 7.8  HGB 11.2* 10.4*  HCT 34.1* 32.4*  MCV 98.8 99.7  PLT 228 237   Cardiac Enzymes: No results for input(s): CKTOTAL, CKMB, CKMBINDEX, TROPONINI in the last 168 hours. BNP (last 3 results) No results for input(s): BNP in the last 8760 hours.  ProBNP (last 3 results) No results for input(s): PROBNP in the last 8760 hours.  CBG: No results for input(s): GLUCAP in the last 168 hours.  No results found for this or any previous visit (from the  past 240 hour(s)).   Studies: Dg Chest Port 1 View  08/12/2015  CLINICAL DATA:  81 year old female shortness of breath. Former smoker. COPD. Initial encounter. EXAM: PORTABLE CHEST 1 VIEW COMPARISON:  88,016 and earlier. FINDINGS: Portable AP semi upright view at 1459 hours. Stable tortuous and  calcified thoracic aorta. Stable cardiac size and mediastinal contours. Stable hyperinflation. No pneumothorax, pulmonary edema, pleural effusion or confluent pulmonary opacity. Degenerative changes Re identified at both shoulders. IMPRESSION: Stable chronic lung disease. No superimposed acute findings are identified. Electronically Signed   By: Odessa Fleming M.D.   On: 08/12/2015 15:10    Scheduled Meds: . aspirin EC  81 mg Oral Daily  . azithromycin  500 mg Intravenous Q24H  . cefTRIAXone (ROCEPHIN)  IV  1 g Intravenous Q24H  . cilostazol  100 mg Oral BID  . enoxaparin (LOVENOX) injection  40 mg Subcutaneous Q24H  . ipratropium-albuterol  3 mL Nebulization QID  . losartan  50 mg Oral QPM  . methylPREDNISolone (SOLU-MEDROL) injection  60 mg Intravenous Q6H  . mometasone-formoterol  2 puff Inhalation BID  . simvastatin  40 mg Oral q1800  . sodium chloride  3 mL Intravenous Q12H  . sodium chloride  3 mL Intravenous Q12H   Continuous Infusions:   Principal Problem:   COPD exacerbation (HCC) Active Problems:   Respiratory failure (HCC)   CAP (community acquired pneumonia)    Time spent: 35 min    Jeralyn Bennett  [facility] Pager [phone]. If 7PM-7AM, please contact night-coverage at [email or web address], password [facility] 08/13/2015, 5:53 PM  LOS: 1 day

## 2015-08-14 MED ORDER — PREDNISONE 20 MG PO TABS: 60.0000 mg | ORAL_TABLET | Freq: Every day | ORAL | Status: DC

## 2015-08-14 MED ORDER — PREDNISONE 20 MG PO TABS
60.0000 mg | ORAL_TABLET | Freq: Every day | ORAL | Status: DC
  Administered 2015-08-15: 60 mg via ORAL
  Filled 2015-08-14: qty 3

## 2015-08-14 MED ORDER — AMLODIPINE BESYLATE 10 MG PO TABS
10.0000 mg | ORAL_TABLET | Freq: Every day | ORAL | Status: DC
  Administered 2015-08-14 – 2015-08-15 (×2): 10 mg via ORAL
  Filled 2015-08-14 (×2): qty 1

## 2015-08-14 MED ORDER — DOXYCYCLINE HYCLATE 100 MG PO TABS
100.0000 mg | ORAL_TABLET | Freq: Two times a day (BID) | ORAL | Status: DC
  Administered 2015-08-14 – 2015-08-15 (×3): 100 mg via ORAL
  Filled 2015-08-14 (×3): qty 1

## 2015-08-14 MED ORDER — ATORVASTATIN CALCIUM 10 MG PO TABS
20.0000 mg | ORAL_TABLET | Freq: Every day | ORAL | Status: DC
  Administered 2015-08-14: 20 mg via ORAL
  Filled 2015-08-14: qty 2

## 2015-08-14 NOTE — Care Management Important Message (Signed)
Important Message  Patient Details  Name: Carrie Pena MRN: [id number] Date of Birth: [birth year]   Medicare Important Message Given:  Yes-second notification given    Renie OraHawkins, [name] 08/14/2015, 3:14 PMImportant Message  Patient Details  Name: Carrie Pena MRN: [id number] Date of Birth: [birth year]   Medicare Important Message Given:  Yes-second notification given    Renie OraHawkins, [name] 08/14/2015, 3:13 PM

## 2015-08-14 NOTE — Evaluation (Signed)
Physical Therapy Evaluation Patient Details Name: Carrie Pena MRN: [id number] DOB: [birth year] Today's Date: 08/14/2015   History of Present Illness  81 year old female with a history of chronic obstructive pulmonary disease, hypertension, cognitive impairment admitted for COPD exacerbation d/t pneumonia     Clinical Impression  Pt admitted with above diagnosis. Pt currently with functional limitations due to the deficits listed below (see PT Problem List). Pt will benefit from skilled PT to increase their independence and safety with mobility to allow discharge to the venue listed below. Pt with generalized weakness, DOE, and requires supplemental oxygen to avoid desaturation. Has been using w/c for mobilty d/t fear of falling. D/c plan SNF for further rehab.        Follow Up Recommendations SNF;Supervision for mobility/OOB    Equipment Recommendations  None recommended by PT    Recommendations for Other Services       Precautions / Restrictions Precautions Precautions: Fall Precaution Comments: monitor sats Restrictions Weight Bearing Restrictions: No      Mobility  Bed Mobility               General bed mobility comments: Pt ws up in a recliner upon arrival.   Transfers Overall transfer level: Needs assistance Equipment used: Rolling walker (2 wheeled) Transfers: Sit to/from Stand Sit to Stand: Min assist         General transfer comment: Pt didn't want to be touched by therapists, wanted to get up on her own. Required guarding and steadying assist rising from the recliner.   Ambulation/Gait Ambulation/Gait assistance: Min assist Ambulation Distance (Feet): 8 Feet Assistive device: Rolling walker (2 wheeled) Gait Pattern/deviations: Step-through pattern;Decreased stride length;Decreased dorsiflexion - right;Decreased weight shift to left;Antalgic;Trunk flexed;Wide base of support Gait velocity: decreased   General Gait Details: Pt became SOB after 8  feet, O2 sat dropped to 81% on 2L oxygen, had to increase O2 to 3L, took pt a few minutes to recover and catch breath. Left pt in recliner on 3L due to continues low level of oxygen (<88%). Nurse was notified.  Min assist for L knee buckling twice during gait possibly due to weakness and instability.      Stairs            Wheelchair Mobility    Modified Rankin (Stroke Patients Only)       Balance Overall balance assessment: Needs assistance         Standing balance support: Bilateral upper extremity supported Standing balance-Leahy Scale: Poor                               Pertinent Vitals/Pain Pain Assessment: No/denies pain    Home Living Family/patient expects to be discharged to:: Skilled nursing facility Living Arrangements: Alone Available Help at Discharge:  (No help available at d/c, pt uses WC at home  )           Home Equipment: Wheelchair - [employer] - 2 wheels Additional Comments: No help available at d/c, pt uses WC at home    Prior Function Level of Independence: Independent with assistive device(s)         Comments: daughter reports pt mostly using w/c for the past year     Hand Dominance        Extremity/Trunk Assessment   Upper Extremity Assessment: Overall WFL for tasks assessed           Lower Extremity Assessment: LLE deficits/detail;Generalized weakness;RLE  deficits/detail RLE Deficits / Details: Impaired active and passive ROM, specifically into dorsiflexion  LLE Deficits / Details: instabillity of the knee, reports and observation of knee buckling during gait   Cervical / Trunk Assessment: Normal  Communication   Communication: No difficulties  Cognition Arousal/Alertness: Awake/alert Behavior During Therapy: WFL for tasks assessed/performed Overall Cognitive Status: Within Functional Limits for tasks assessed                      General Comments      Exercises        Assessment/Plan     PT Assessment Patient needs continued PT services  PT Diagnosis Difficulty walking;Abnormality of gait;Generalized weakness   PT Problem List Decreased strength;Decreased range of motion;Decreased activity tolerance;Decreased balance;Decreased mobility;Cardiopulmonary status limiting activity;Decreased knowledge of use of DME;Decreased safety awareness  PT Treatment Interventions DME instruction;Gait training;Functional mobility training;Therapeutic activities;Therapeutic exercise;Balance training;Wheelchair mobility training;Patient/family education   PT Goals (Current goals can be found in the Care Plan section) Acute Rehab PT Goals Patient Stated Goal[facility] PT Goal Formulation: With patient/family Time For Goal Achievement: 08/20/15 Potential to Achieve Goals: Good    Frequency Min 3X/week   Barriers to discharge        Co-evaluation               End of Session Equipment Utilized During Treatment: Oxygen Activity Tolerance: Treatment limited secondary to medical complications (Comment) Patient left: in chair;with call bell/phone within reach;with chair alarm set Nurse Communication: Mobility status;Other (comment) (oxygen supply increased to 3 L)         Time: [id number] PT Time Calculation (min) (ACUTE ONLY): 23 min   Charges:   PT Evaluation $Initial PT Evaluation Tier I: 1 Procedure     PT G Codes:        Uday Jantz, SPT 08/14/2015, 3:26 PM

## 2015-08-14 NOTE — Progress Notes (Signed)
[facility] PROGRESS NOTE  Carrie Pena WUJ:[id number] DOB: October 11, 1928 DOA: 08/12/2015 PCP: Carrie Pena  Assessment/Plan: 1. Chronic obstructive pulmonary disease exacerbation -Patient resented with complaints of worsening shortness of breath associated with cough and wheezing. Initial workup in the emergency department included a chest x-ray which did not show acute infiltrate. -She was started on systemic steroids with centimeters 60 mg IV every 6 hours along with scheduled DuoNeb's and empiric IV antimicrobial therapy -On my examination on 08/14/2015 there has been interval improvement to her lung exam, with wheezing and rhonchi less pronounced.  -Will transition to oral steroid and antibiotic therapy   2.  Acute hypoxemic respiratory failure -Present on admission evidence by an O2 sat of 89% with respiratory to 27 -Likely caused by COPD exacerbation -On room air she maintain sats of 89-90%. He reports not using supplemental oxygen at home. -Will continue treating COPD exacerbation and possible community acquire pneumonia.  3.  Community-acquired pneumonia. -Possible infectious process may have precipitated COPD exacerbation. -She was covered with IV ceftriaxone and azithromycin, change to doxy on 08/14/2015  4.  Hypertension -Blood pressures remain elevated over the course of the day -Increased losartan from 50 mg to 100 mg by mouth daily on 08/13/2015. Blood pressures remain elevated will add Norvasc 10 mg PO q daily.   Code Status: Full Code Family Communication: I spoke to his daughter at bedside Disposition Plan: Plan to discharge to SNF in the next 24 hours.    Consultants:  Physical therapy   Antibiotics:  Ceftriaxone discontinued on 08/14/2015  Azithromycin discontinued on 08/14/2015  Doxycycline started on 08/14/2059  HPI/Subjective: Patient is a pleasant 81 year old female with a history of chronic obstructive pulmonary disease, hypertension,  cognitive impairment admitted to medicine service on 08/12/2015 when she presented with complaints of shortness of breath associate with cough and wheezing. Symptoms felt to be secondary to COPD exacerbation. She was started on systemic steroids, empiric IV antibiotic therapy, duo nebs.  Objective: Filed Vitals:   08/14/15 0638  BP: 160/80  Pulse: 85  Temp: 98.2 F (36.8 C)  Resp: 20    Intake/Output Summary (Last 24 hours) at 08/14/15 1225 Last data filed at 08/14/15 1005  Gross per 24 hour  Intake    720 ml  Output   2100 ml  Net  -1380 ml   Filed Weights   08/12/15 1939  Weight: 58.378 kg (128 lb 11.2 oz)    Exam:   General:  Patient is in no acute distress, she is mild confusion   Cardiovascular: Regular rate rhythm normal S1-S2  Respiratory: Improved lung exam in the interim, less pronounced wheezing. Remains of supplemental oxygen  Abdomen: Soft nontender nondistended  Musculoskeletal: No edema  Data Reviewed: Basic Metabolic Panel:  Recent Labs Lab 08/12/15 1459 08/13/15 0520  NA 135 135  K 3.4* 3.6  CL 101 101  CO2 27 28  GLUCOSE 107* 168*  BUN 6 15  CREATININE 0.69 0.93  CALCIUM 9.0 9.0   Liver Function Tests: No results for input(s): AST, ALT, ALKPHOS, BILITOT, PROT, ALBUMIN in the last 168 hours. No results for input(s): LIPASE, AMYLASE in the last 168 hours. No results for input(s): AMMONIA in the last 168 hours. CBC:  Recent Labs Lab 08/12/15 1459 08/13/15 0520  WBC 8.0 7.8  HGB 11.2* 10.4*  HCT 34.1* 32.4*  MCV 98.8 99.7  PLT 228 237   Cardiac Enzymes: No results for input(s): CKTOTAL, CKMB, CKMBINDEX, TROPONINI in the last 168 hours. BNP (last  3 results) No results for input(s): BNP in the last 8760 hours.  ProBNP (last 3 results) No results for input(s): PROBNP in the last 8760 hours.  CBG: No results for input(s): GLUCAP in the last 168 hours.  No results found for this or any previous visit (from the past 240 hour(s)).    Studies: Dg Chest Port 1 View  08/12/2015  CLINICAL DATA:  81 year old female shortness of breath. Former smoker. COPD. Initial encounter. EXAM: PORTABLE CHEST 1 VIEW COMPARISON:  88,016 and earlier. FINDINGS: Portable AP semi upright view at 1459 hours. Stable tortuous and calcified thoracic aorta. Stable cardiac size and mediastinal contours. Stable hyperinflation. No pneumothorax, pulmonary edema, pleural effusion or confluent pulmonary opacity. Degenerative changes Re identified at both shoulders. IMPRESSION: Stable chronic lung disease. No superimposed acute findings are identified. Electronically Signed   By: Carrie Fleming M.D.   On: 08/12/2015 15:10    Scheduled Meds: . amLODipine  10 mg Oral Daily  . aspirin EC  81 mg Oral Daily  . azithromycin  500 mg Intravenous Q24H  . cefTRIAXone (ROCEPHIN)  IV  1 g Intravenous Q24H  . cilostazol  100 mg Oral BID  . enoxaparin (LOVENOX) injection  40 mg Subcutaneous Q24H  . ipratropium-albuterol  3 mL Nebulization QID  . losartan  100 mg Oral QPM  . methylPREDNISolone (SOLU-MEDROL) injection  60 mg Intravenous Q6H  . mometasone-formoterol  2 puff Inhalation BID  . nicotine  21 mg Transdermal Daily  . simvastatin  40 mg Oral q1800  . sodium chloride  3 mL Intravenous Q12H  . sodium chloride  3 mL Intravenous Q12H   Continuous Infusions:   Principal Problem:   COPD exacerbation (HCC) Active Problems:   Respiratory failure (HCC)   CAP (community acquired pneumonia)    Time spent: 25 min    Carrie Pena  [facility] Pager [phone]. If 7PM-7AM, please contact night-coverage at [email or web address], password [facility] 08/14/2015, 12:25 PM  LOS: 2 days

## 2015-08-14 NOTE — [place] FL2 (Signed)
[place] MEDICAID FL2 LEVEL OF CARE SCREENING TOOL     IDENTIFICATION  Patient Name: Carrie Pena Birthdate: 12/09/1927 Sex: female Admission Date (Current Location): 08/12/2015  [place] and [place] Number:  Medical sales representative)   Facility and Address:  [facility],  [address] [place] [address]      Provider Number: [id number]  Attending Physician Name and Address:  Jeralyn Bennett, MD  Relative Name and Phone Number:       Current Level of Care: Hospital Recommended Level of Care: Skilled Nursing Facility Prior Approval Number:    Date Approved/Denied:   PASRR Number:  [id number]   Discharge Plan: SNF    Current Diagnoses: Patient Active Problem List   Diagnosis Date Noted  . COPD exacerbation (HCC) 08/12/2015  . Respiratory failure (HCC) 08/12/2015  . CAP (community acquired pneumonia) 08/12/2015  . Pain of right lower extremity 05/06/2014  . Swelling of limb 05/06/2014  . Atherosclerosis of native arteries of the extremities with intermittent claudication 07/18/2011    Orientation ACTIVITIES/SOCIAL BLADDER RESPIRATION    Self, Time, Situation, Place  Active Continent O2 (As needed) (2L continuous)  BEHAVIORAL SYMPTOMS/MOOD NEUROLOGICAL BOWEL NUTRITION STATUS      Continent Diet  PHYSICIAN VISITS COMMUNICATION OF NEEDS Height & Weight Skin    Verbally  (157.5 cm) 128 lbs. Normal          AMBULATORY STATUS RESPIRATION    Assist extensive O2 (As needed) (2L continuous)      Personal Care Assistance Level of Assistance  Bathing, Dressing Bathing Assistance: Limited assistance   Dressing Assistance: Limited assistance      Functional Limitations Info                SPECIAL CARE FACTORS FREQUENCY  PT (By licensed PT), OT (By licensed OT)     PT Frequency: 5 OT Frequency: 5           Additional Factors Info  Code Status, Allergies Code Status Info: Full Code Allergies Info: Tylenol           Current  Medications (08/14/2015): Current Facility-Administered Medications  Medication Dose Route Frequency Provider Last Rate Last Dose  . 0.9 %  sodium chloride infusion  250 mL Intravenous PRN Jeralyn Bennett, MD      . alum & mag hydroxide-simeth (MAALOX/MYLANTA) 200-200-20 MG/5ML suspension 30 mL  30 mL Oral Q6H PRN Jeralyn Bennett, MD      . amLODipine (NORVASC) tablet 10 mg  10 mg Oral Daily Jeralyn Bennett, MD      . aspirin EC tablet 81 mg  81 mg Oral Daily Jeralyn Bennett, MD   81 mg at 08/14/15 5621  . cilostazol (PLETAL) tablet 100 mg  100 mg Oral BID Jeralyn Bennett, MD   100 mg at 08/14/15 3086  . cyclobenzaprine (FLEXERIL) tablet 5 mg  5 mg Oral TID PRN Jeralyn Bennett, MD   5 mg at 08/13/15 2150  . doxycycline (VIBRA-TABS) tablet 100 mg  100 mg Oral Q12H Jeralyn Bennett, MD      . enoxaparin (LOVENOX) injection 40 mg  40 mg Subcutaneous Q24H Jeralyn Bennett, MD   40 mg at 08/13/15 2150  . guaiFENesin (MUCINEX) 12 hr tablet 600 mg  600 mg Oral BID PRN Jeralyn Bennett, MD      . ipratropium-albuterol (DUONEB) 0.5-2.5 (3) MG/3ML nebulizer solution 3 mL  3 mL Nebulization QID Jeralyn Bennett, MD   3 mL at 08/14/15 0746  . ipratropium-albuterol (DUONEB) 0.5-2.5 (3)  MG/3ML nebulizer solution 3 mL  3 mL Nebulization Q4H PRN Leanne Chang, NP   3 mL at 08/14/15 1111  . losartan (COZAAR) tablet 100 mg  100 mg Oral QPM Jeralyn Bennett, MD   100 mg at 08/13/15 1803  . mometasone-formoterol (DULERA) 100-5 MCG/ACT inhaler 2 puff  2 puff Inhalation BID Jeralyn Bennett, MD   2 puff at 08/14/15 0747  . nicotine (NICODERM CQ - dosed in mg/24 hours) patch 21 mg  21 mg Transdermal Daily Jeralyn Bennett, MD   21 mg at 08/13/15 2000  . ondansetron (ZOFRAN) tablet 4 mg  4 mg Oral Q6H PRN Jeralyn Bennett, MD       Or  . ondansetron (ZOFRAN) injection 4 mg  4 mg Intravenous Q6H PRN Jeralyn Bennett, MD      . predniSONE (DELTASONE) tablet 60 mg  60 mg Oral Q breakfast Jeralyn Bennett, MD      . simvastatin  (ZOCOR) tablet 40 mg  40 mg Oral q1800 Jeralyn Bennett, MD   40 mg at 08/13/15 1804  . sodium chloride 0.9 % injection 3 mL  3 mL Intravenous Q12H Jeralyn Bennett, MD   3 mL at 08/14/15 0929  . sodium chloride 0.9 % injection 3 mL  3 mL Intravenous Q12H Jeralyn Bennett, MD   3 mL at 08/14/15 0929  . sodium chloride 0.9 % injection 3 mL  3 mL Intravenous PRN Jeralyn Bennett, MD   3 mL at 08/14/15 0981   Do not use this list as official medication orders. Please verify with discharge summary.  Discharge Medications:   Medication List    ASK your doctor about these medications        albuterol 108 (90 BASE) MCG/ACT inhaler  Commonly known as:  PROVENTIL HFA;VENTOLIN HFA  Inhale 1-2 puffs into the lungs every 6 (six) hours as needed for wheezing or shortness of breath.     aspirin 81 MG tablet  Take 81 mg by mouth daily.     b complex vitamins tablet  Take 1 tablet by mouth daily.     Cholecalciferol 1000 UNITS tablet  Take 1,000 Units by mouth every Wednesday.     cilostazol 100 MG tablet  Commonly known as:  PLETAL  TAKE 1 TABLET TWICE DAILY.     cyclobenzaprine 5 MG tablet  Commonly known as:  FLEXERIL  Take 5 mg by mouth 3 (three) times daily as needed for muscle spasms.     Fluticasone-Salmeterol 100-50 MCG/DOSE Aepb  Commonly known as:  ADVAIR  Inhale 1 puff into the lungs 2 (two) times daily.     guaiFENesin 600 MG 12 hr tablet  Commonly known as:  MUCINEX  Take 600 mg by mouth 2 (two) times daily as needed for cough or to loosen phlegm.     ibandronate 150 MG tablet  Commonly known as:  BONIVA  Take 150 mg by mouth every 30 (thirty) days. Take in the morning with a full glass of water, on an empty stomach, and do not take anything else by mouth or lie down for the next 30 min.     ipratropium-albuterol 0.5-2.5 (3) MG/3ML Soln  Commonly known as:  DUONEB  Take 3 mLs by nebulization 4 (four) times daily.     losartan 50 MG tablet  Commonly known as:  COZAAR  Take  50 mg by mouth every evening.     meclizine 25 MG tablet  Commonly known as:  ANTIVERT  Take 25 mg  by mouth 3 (three) times daily as needed for dizziness.     meloxicam 15 MG tablet  Commonly known as:  MOBIC  Take 15 mg by mouth daily as needed for pain.     naproxen sodium 220 MG tablet  Commonly known as:  ANAPROX  Take 220 mg by mouth every 8 (eight) hours as needed (For pain.).     Potassium Chloride ER 20 MEQ Tbcr  Take 10 mEq by mouth daily.     simvastatin 40 MG tablet  Commonly known as:  ZOCOR  Take 40 mg by mouth daily at 6 PM.     vitamin C 500 MG tablet  Commonly known as:  ASCORBIC ACID  Take 500 mg by mouth daily.        Relevant Imaging Results:  Relevant Lab Results:  Recent Labs    Additional Information SS# [id number]  Liliana ClineCaldwell, Nashiya Disbrow Parks, LCSW

## 2015-08-14 NOTE — Clinical Social Work Placement (Signed)
   CLINICAL SOCIAL WORK PLACEMENT  NOTE  Date:  08/14/2015  Patient Details  Name: Carrie Pena MRN: [id number] Date of Birth: 02-Aug-1928  Clinical Social Work is seeking post-discharge placement for this patient at the Skilled  Nursing Facility level of care (*CSW will initial, date and re-position this form in  chart as items are completed):  Yes   Patient/family provided with [facility] Clinical Social Work Department's list of facilities offering this level of care within the geographic area requested by the patient (or if unable, by the patient's family).  Yes   Patient/family informed of their freedom to choose among providers that offer the needed level of care, that participate in Medicare, Medicaid or managed care program needed by the patient, have an available bed and are willing to accept the patient.  Yes   Patient/family informed of [facility] ownership interest in [facility] and [facility], as well as of the fact that they are under no obligation to receive care at these facilities.  PASRR submitted to EDS on       PASRR number received on       Existing PASRR number confirmed on 08/14/15     FL2 transmitted to all facilities in geographic area requested by pt/family on 08/14/15     FL2 transmitted to all facilities within larger geographic area on       Patient informed that his/her managed care company has contracts with or will negotiate with certain facilities, including the following:            Patient/family informed of bed offers received.  Patient chooses bed at       Physician recommends and patient chooses bed at      Patient to be transferred to   on  .  Patient to be transferred to facility by       Patient family notified on   of transfer.  Name of family member notified:        PHYSICIAN Please sign FL2, Please prepare priority discharge summary, including medications     Additional Comment:     _______________________________________________ Liliana Clinealdwell, Maelee Hoot Parks, LCSW 08/14/2015, 12:51 PM

## 2015-08-14 NOTE — Clinical Social Work Note (Signed)
Clinical Social Work Assessment  Patient Details  Name: Carrie Pena MRN: [id number] Date of Birth: 09-25-1928  Date of referral:  08/14/15               Reason for consult:  Facility Placement                Permission sought to share information with:  Family Supports, Chartered certified accountant granted to share information::  Yes, Verbal Permission Granted  Name::      (daughterDeneise Pena)  Agency::     Relationship::     Contact Information:     Housing/Transportation Living arrangements for the past 2 months:  Single Family Home Source of Information:  Patient, Adult Children Patient Interpreter Needed:  None Criminal Activity/Legal Involvement Pertinent to Current Situation/Hospitalization:  No - Comment as needed Significant Relationships:  Friend, Adult Children, Community Support Lives with:  Self Do you feel safe going back to the place where you live?  No Need for family participation in patient care:  Yes (Comment)  Care giving concerns:  May need SNF per MD- PT pending eval  Social Worker assessment / plan:  CSW met with patient and daughter who agree that patient will likely need SNF for rehab at dc. Patient is interested in [facility] "I've heard good things about them"-  Patient lives alone but has pvt duty caregiver that comes 3 days a week for 3 hours- helping with laurndry, cleaning, cooking, etc.     Employment status:  Retired Forensic scientist:  Medicare PT Recommendations:  Not assessed at this time Information / Referral to community resources:  [place]  Patient/Family's Response to care:  Both patient and daughter understand plan of care and agree with dc plans as well-   Patient/Family's Understanding of and Emotional Response to Diagnosis, Current Treatment, and Prognosis:  Patient and daughter realize SNF will be beneficial for her at dc as she is home alone a lot and in need of more care at this time-     Emotional Assessment Appearance:  Developmentally appropriate Attitude/Demeanor/Rapport:    Affect (typically observed):  Accepting, Appropriate Orientation:  Oriented to Self, Oriented to Place, Oriented to  Time, Oriented to Situation Alcohol / Substance use:  Not Applicable Psych involvement (Current and /or in the community):  No (Comment)  Discharge Needs  Concerns to be addressed:  Discharge Planning Concerns Readmission within the last 30 days:  No Current discharge risk:  None Barriers to Discharge:  No Barriers Identified   Ludwig Clarks, LCSW 08/14/2015, 12:40 PM

## 2015-08-15 DIAGNOSIS — R0602 Shortness of breath: Secondary | ICD-10-CM | POA: Diagnosis not present

## 2015-08-15 DIAGNOSIS — J9690 Respiratory failure, unspecified, unspecified whether with hypoxia or hypercapnia: Secondary | ICD-10-CM | POA: Diagnosis not present

## 2015-08-15 DIAGNOSIS — K219 Gastro-esophageal reflux disease without esophagitis: Secondary | ICD-10-CM | POA: Diagnosis not present

## 2015-08-15 DIAGNOSIS — K5900 Constipation, unspecified: Secondary | ICD-10-CM | POA: Diagnosis not present

## 2015-08-15 DIAGNOSIS — M7989 Other specified soft tissue disorders: Secondary | ICD-10-CM | POA: Diagnosis not present

## 2015-08-15 DIAGNOSIS — J9601 Acute respiratory failure with hypoxia: Secondary | ICD-10-CM | POA: Diagnosis not present

## 2015-08-15 DIAGNOSIS — D638 Anemia in other chronic diseases classified elsewhere: Secondary | ICD-10-CM | POA: Diagnosis not present

## 2015-08-15 DIAGNOSIS — Z7901 Long term (current) use of anticoagulants: Secondary | ICD-10-CM | POA: Diagnosis not present

## 2015-08-15 DIAGNOSIS — R5381 Other malaise: Secondary | ICD-10-CM | POA: Diagnosis not present

## 2015-08-15 DIAGNOSIS — J441 Chronic obstructive pulmonary disease with (acute) exacerbation: Secondary | ICD-10-CM | POA: Diagnosis not present

## 2015-08-15 DIAGNOSIS — I2510 Atherosclerotic heart disease of native coronary artery without angina pectoris: Secondary | ICD-10-CM | POA: Diagnosis not present

## 2015-08-15 DIAGNOSIS — I739 Peripheral vascular disease, unspecified: Secondary | ICD-10-CM | POA: Diagnosis not present

## 2015-08-15 DIAGNOSIS — J189 Pneumonia, unspecified organism: Secondary | ICD-10-CM | POA: Diagnosis not present

## 2015-08-15 DIAGNOSIS — R2681 Unsteadiness on feet: Secondary | ICD-10-CM | POA: Diagnosis not present

## 2015-08-15 DIAGNOSIS — I10 Essential (primary) hypertension: Secondary | ICD-10-CM | POA: Diagnosis not present

## 2015-08-15 DIAGNOSIS — M6281 Muscle weakness (generalized): Secondary | ICD-10-CM | POA: Diagnosis not present

## 2015-08-15 DIAGNOSIS — E785 Hyperlipidemia, unspecified: Secondary | ICD-10-CM | POA: Diagnosis not present

## 2015-08-15 DIAGNOSIS — M810 Age-related osteoporosis without current pathological fracture: Secondary | ICD-10-CM | POA: Diagnosis not present

## 2015-08-15 DIAGNOSIS — Z5189 Encounter for other specified aftercare: Secondary | ICD-10-CM | POA: Diagnosis not present

## 2015-08-15 DIAGNOSIS — M79661 Pain in right lower leg: Secondary | ICD-10-CM | POA: Diagnosis not present

## 2015-08-15 LAB — CBC
HCT: 31.2 % — ABNORMAL LOW (ref 36.0–46.0)
Hemoglobin: 9.9 g/dL — ABNORMAL LOW (ref 12.0–15.0)
MCH: 31.5 pg (ref 26.0–34.0)
MCHC: 31.7 g/dL (ref 30.0–36.0)
MCV: 99.4 fL (ref 78.0–100.0)
PLATELETS: 236 10*3/uL (ref 150–400)
RBC: 3.14 MIL/uL — ABNORMAL LOW (ref 3.87–5.11)
RDW: 14.6 % (ref 11.5–15.5)
WBC: 10.2 10*3/uL (ref 4.0–10.5)

## 2015-08-15 LAB — BASIC METABOLIC PANEL
Anion gap: 3 — ABNORMAL LOW (ref 5–15)
BUN: 26 mg/dL — ABNORMAL HIGH (ref 6–20)
CALCIUM: 8.4 mg/dL — AB (ref 8.9–10.3)
CO2: 31 mmol/L (ref 22–32)
CREATININE: 0.95 mg/dL (ref 0.44–1.00)
Chloride: 103 mmol/L (ref 101–111)
GFR calc Af Amer: >60 mL/min (ref >60)
GFR, EST NON AFRICAN AMERICAN: 53 mL/min — AB (ref >60)
GLUCOSE: 110 mg/dL — AB (ref 65–99)
Potassium: 3.8 mmol/L (ref 3.5–5.1)
Sodium: 137 mmol/L (ref 135–145)

## 2015-08-15 MED ORDER — IPRATROPIUM-ALBUTEROL 0.5-2.5 (3) MG/3ML IN SOLN: 3.0000 mL | RESPIRATORY_TRACT | Status: DC | PRN

## 2015-08-15 MED ORDER — LOSARTAN POTASSIUM 100 MG PO TABS: 100.0000 mg | ORAL_TABLET | Freq: Every evening | ORAL | Status: DC

## 2015-08-15 MED ORDER — AMLODIPINE BESYLATE 10 MG PO TABS: 10.0000 mg | ORAL_TABLET | Freq: Every day | ORAL | Status: DC

## 2015-08-15 MED ORDER — PREDNISONE 10 MG (21) PO TBPK: ORAL_TABLET | ORAL | Status: DC

## 2015-08-15 MED ORDER — DOXYCYCLINE HYCLATE 100 MG PO TABS: 100.0000 mg | ORAL_TABLET | Freq: Two times a day (BID) | ORAL | Status: DC

## 2015-08-15 NOTE — Discharge Summary (Signed)
Physician Discharge Summary  Carrie Pena ZOX:[id number] DOB: [birth year] DOA: 08/12/2015  PCP: Thayer Headings, MD  Admit date: 08/12/2015 Discharge date: 08/15/2015  Time spent: 35 minutes  Recommendations for Outpatient Follow-up:  1. Please follow up on respiratory status, she was treated for a COPD exacerbation  2. Follow up on blood pressures, she was hypertensive during this hospitalization and required titration to BP meds.   3. She was discharged to [facility]  Discharge Diagnoses:  Principal Problem:   COPD exacerbation (HCC) Active Problems:   Respiratory failure (HCC)   CAP (community acquired pneumonia)   Discharge Condition: Stable/Improved  Diet recommendation: Heart Healthy  Filed Weights   08/12/15 1939  Weight: 58.378 kg (128 lb 11.2 oz)    History of present illness:  Carrie Pena is a 81 y.o. female with a past medical history of chronic affective pulmonary disease, hypertension, dyslipidemia, presenting to the emergency department with complaints of shortness of breath associate with cough that started 4 days ago. She reports progressively worsening shortness of breath becoming severe overnight. This is associate with wheezing, cough having sputum production as well as congestion. She denies fevers chills nausea vomiting. She called EMS due to worsening of symptoms. In the emergency room she was treated with IV steroids, nebulizer treatments along with antibiotic therapy. Initial chest x-ray showed stable chronic lung changes.   Hospital Course:  Patient is a pleasant 81 year old female with a history of chronic obstructive pulmonary disease, hypertension, cognitive impairment admitted to medicine service on 08/12/2015 when she presented with complaints of shortness of breath associate with cough and wheezing. Symptoms felt to be secondary to COPD exacerbation. She was started on systemic steroids, empiric IV antibiotic  therapy, duo nebs.   Chronic obstructive pulmonary disease exacerbation -Patient resented with complaints of worsening shortness of breath associated with cough and wheezing. Initial workup in the emergency department included a chest x-ray which did not show acute infiltrate. -She was started on systemic steroids with centimeters 60 mg IV every 6 hours along with scheduled DuoNeb's and empiric IV antimicrobial therapy -On my examination on 08/14/2015 there has been interval improvement to her lung exam, with wheezing and rhonchi less pronounced.  -Transitioned to oral steroid and antibiotic therapy on 08/14/2015  2. Acute hypoxemic respiratory failure -Present on admission evidence by an O2 sat of 89% with respiratory to 27 -Likely caused by COPD exacerbation -Improved, though de-sate to 88%, will discharge on Home O2  3. Community-acquired pneumonia. -Possible infectious process may have precipitated COPD exacerbation. -She was covered with IV ceftriaxone and azithromycin, change to doxy on 08/14/2015  4. Hypertension -Blood pressures remain elevated over the course of the day -Increased losartan from 50 mg to 100 mg by mouth daily on 08/13/2015. Added Norvasc 10 mg PO q daily, please follow up on bloop pressures.     Discharge Exam: Filed Vitals:   08/15/15 0746  BP: 175/91  Pulse: 88  Temp: 98.1 F (36.7 C)  Resp: 20     General: Patient is in no acute distress, reports feeling better, states she is ready to go to rehab.   Cardiovascular: Regular rate rhythm normal S1-S2  Respiratory: Improved lung exam in the interim, near resolution of wheezing.. Remains of supplemental oxygen  Abdomen: Soft nontender nondistended  Musculoskeletal: No edema  Discharge Instructions   Discharge Instructions    Call MD for:  difficulty breathing, headache or visual disturbances    Complete by:  As directed  Call MD for:  extreme fatigue    Complete by:  As directed       Call MD for:  hives    Complete by:  As directed      Call MD for:  persistant dizziness or light-headedness    Complete by:  As directed      Call MD for:  persistant nausea and vomiting    Complete by:  As directed      Call MD for:  redness, tenderness, or signs of infection (pain, swelling, redness, odor or green/yellow discharge around incision site)    Complete by:  As directed      Call MD for:  severe uncontrolled pain    Complete by:  As directed      Call MD for:  temperature >100.4    Complete by:  As directed      Call MD for:    Complete by:  As directed      Diet - low sodium heart healthy    Complete by:  As directed      Increase activity slowly    Complete by:  As directed           Current Discharge Medication List    START taking these medications   Details  amLODipine (NORVASC) 10 MG tablet Take 1 tablet (10 mg total) by mouth daily. Qty: 30 tablet, Refills: 0    doxycycline (VIBRA-TABS) 100 MG tablet Take 1 tablet (100 mg total) by mouth every 12 (twelve) hours. Qty: 10 tablet, Refills: 0    predniSONE (STERAPRED UNI-PAK 21 TAB) 10 MG (21) TBPK tablet Take 6-5-4-3-2-1 tablets by mouth daily till gone. Qty: 21 tablet, Refills: 0      CONTINUE these medications which have CHANGED   Details  ipratropium-albuterol (DUONEB) 0.5-2.5 (3) MG/3ML SOLN Take 3 mLs by nebulization every 4 (four) hours as needed. Qty: 360 mL, Refills: 0    losartan (COZAAR) 100 MG tablet Take 1 tablet (100 mg total) by mouth every evening. Qty: 30 tablet, Refills: 0      CONTINUE these medications which have NOT CHANGED   Details  aspirin 81 MG tablet Take 81 mg by mouth daily.      b complex vitamins tablet Take 1 tablet by mouth daily.    Cholecalciferol 1000 UNITS tablet Take 1,000 Units by mouth every Wednesday.     cilostazol (PLETAL) 100 MG tablet TAKE 1 TABLET TWICE DAILY. Qty: 60 tablet, Refills: 11    cyclobenzaprine (FLEXERIL) 5 MG tablet Take 5 mg by mouth  3 (three) times daily as needed for muscle spasms.     Fluticasone-Salmeterol (ADVAIR) 100-50 MCG/DOSE AEPB Inhale 1 puff into the lungs 2 (two) times daily.    guaiFENesin (MUCINEX) 600 MG 12 hr tablet Take 600 mg by mouth 2 (two) times daily as needed for cough or to loosen phlegm.    ibandronate (BONIVA) 150 MG tablet Take 150 mg by mouth every 30 (thirty) days. Take in the morning with a full glass of water, on an empty stomach, and do not take anything else by mouth or lie down for the next 30 min.    simvastatin (ZOCOR) 40 MG tablet Take 40 mg by mouth daily at 6 PM.    vitamin C (ASCORBIC ACID) 500 MG tablet Take 500 mg by mouth daily.      STOP taking these medications     albuterol (PROVENTIL HFA;VENTOLIN HFA) 108 (90 BASE) MCG/ACT inhaler  meclizine (ANTIVERT) 25 MG tablet      meloxicam (MOBIC) 15 MG tablet      naproxen sodium (ANAPROX) 220 MG tablet      potassium chloride 20 MEQ TBCR        Allergies  Allergen Reactions  . Tylenol [Acetaminophen] Other (See Comments)    Pt states "It makes me shake"   Follow-up Information    Follow up with Thayer Headings, MD In 2 weeks.   Specialty:  Internal Medicine   Contact information:   [address] [place] [address] [phone]        The results of significant diagnostics from this hospitalization (including imaging, microbiology, ancillary and laboratory) are listed below for reference.    Significant Diagnostic Studies: Dg Chest Port 1 View  08/12/2015  CLINICAL DATA:  81 year old female shortness of breath. Former smoker. COPD. Initial encounter. EXAM: PORTABLE CHEST 1 VIEW COMPARISON:  88,016 and earlier. FINDINGS: Portable AP semi upright view at 1459 hours. Stable tortuous and calcified thoracic aorta. Stable cardiac size and mediastinal contours. Stable hyperinflation. No pneumothorax, pulmonary edema, pleural effusion or confluent pulmonary opacity. Degenerative changes Re  identified at both shoulders. IMPRESSION: Stable chronic lung disease. No superimposed acute findings are identified. Electronically Signed   By: Odessa Fleming M.D.   On: 08/12/2015 15:10    Microbiology: No results found for this or any previous visit (from the past 240 hour(s)).   Labs: Basic Metabolic Panel:  Recent Labs Lab 08/12/15 1459 08/13/15 0520 08/15/15 0508  NA 135 135 137  K 3.4* 3.6 3.8  CL 101 101 103  CO2 GLUCOSE 107* 168* 110*  BUN 6 15 26*  CREATININE 0.69 0.93 0.95  CALCIUM 9.0 9.0 8.4*   Liver Function Tests: No results for input(s): AST, ALT, ALKPHOS, BILITOT, PROT, ALBUMIN in the last 168 hours. No results for input(s): LIPASE, AMYLASE in the last 168 hours. No results for input(s): AMMONIA in the last 168 hours. CBC:  Recent Labs Lab 08/12/15 1459 08/13/15 0520 08/15/15 0508  WBC 8.0 7.8 10.2  HGB 11.2* 10.4* 9.9*  HCT 34.1* 32.4* 31.2*  MCV 98.8 99.7 99.4  PLT 228 237 236   Cardiac Enzymes: No results for input(s): CKTOTAL, CKMB, CKMBINDEX, TROPONINI in the last 168 hours. BNP: BNP (last 3 results) No results for input(s): BNP in the last 8760 hours.  ProBNP (last 3 results) No results for input(s): PROBNP in the last 8760 hours.  CBG: No results for input(s): GLUCAP in the last 168 hours.     SignedJeralyn Bennett  [facility] 08/15/2015, 9:58 AM

## 2015-08-15 NOTE — Clinical Social Work Placement (Signed)
   CLINICAL SOCIAL WORK PLACEMENT  NOTE 10/15/15 - DISCHARGED TO [facility]  Date:  08/15/2015  Patient Details  Name: Carrie Pena MRN: [id number] Date of Birth: [birth year]  Clinical Social Work is seeking post-discharge placement for this patient at the Skilled  Nursing Facility level of care (*CSW will initial, date and re-position this form in  chart as items are completed):  Yes   Patient/family provided with [facility] Clinical Social Work Department's list of facilities offering this level of care within the geographic area requested by the patient (or if unable, by the patient's family).  Yes   Patient/family informed of their freedom to choose among providers that offer the needed level of care, that participate in Medicare, Medicaid or managed care program needed by the patient, have an available bed and are willing to accept the patient.  Yes   Patient/family informed of [facility]'s ownership interest in [facility] and [facility], as well as of the fact that they are under no obligation to receive care at these facilities.  PASRR submitted to EDS on       PASRR number received on       Existing PASRR number confirmed on 08/14/15     FL2 transmitted to all facilities in geographic area requested by pt/family on 08/14/15     FL2 transmitted to all facilities within larger geographic area on       Patient informed that his/her managed care company has contracts with or will negotiate with certain facilities, including the following:            Patient/family informed of bed offers received.  [facility]     Physician recommends and patient chooses bed at      [facility]  Patient to be transferred to facility by  ambulance     Patient family notified on  08/15/15 of transfer.  Name of family member notified:   Daughter [name], at the bedside.  PHYSICIAN Please sign  FL2, Please prepare priority discharge summary, including medications     Additional Comment:    _______________________________________________ Cristobal Goldmannrawford, Delano Frate Bradley, LCSW 08/15/2015, 10:49 AM

## 2015-08-15 NOTE — Progress Notes (Signed)
[facility] by [facility]. Patient stable at time of discharge.

## 2015-08-18 ENCOUNTER — Non-Acute Institutional Stay (SKILLED_NURSING_FACILITY): Payer: Medicare Other | Admitting: Adult Health

## 2015-08-18 ENCOUNTER — Encounter: Payer: Self-pay | Admitting: Adult Health

## 2015-08-18 DIAGNOSIS — J189 Pneumonia, unspecified organism: Secondary | ICD-10-CM

## 2015-08-18 DIAGNOSIS — D638 Anemia in other chronic diseases classified elsewhere: Secondary | ICD-10-CM | POA: Diagnosis not present

## 2015-08-18 DIAGNOSIS — J9601 Acute respiratory failure with hypoxia: Secondary | ICD-10-CM | POA: Diagnosis not present

## 2015-08-18 DIAGNOSIS — I2510 Atherosclerotic heart disease of native coronary artery without angina pectoris: Secondary | ICD-10-CM | POA: Diagnosis not present

## 2015-08-18 DIAGNOSIS — R5381 Other malaise: Secondary | ICD-10-CM | POA: Diagnosis not present

## 2015-08-18 DIAGNOSIS — I739 Peripheral vascular disease, unspecified: Secondary | ICD-10-CM

## 2015-08-18 DIAGNOSIS — J441 Chronic obstructive pulmonary disease with (acute) exacerbation: Secondary | ICD-10-CM

## 2015-08-18 DIAGNOSIS — I10 Essential (primary) hypertension: Secondary | ICD-10-CM

## 2015-08-18 LAB — BASIC METABOLIC PANEL
BUN: 25 mg/dL — AB (ref 4–21)
Creatinine: 0.9 mg/dL (ref 0.5–1.1)
Glucose: 118 mg/dL
Potassium: 3.9 mmol/L (ref 3.4–5.3)
Sodium: 138 mmol/L (ref 137–147)

## 2015-08-18 LAB — HEPATIC FUNCTION PANEL
ALT: 19 U/L (ref 7–35)
AST: 14 U/L (ref 13–35)
Alkaline Phosphatase: 71 U/L (ref 25–125)
Bilirubin, Total: 0.4 mg/dL

## 2015-08-18 LAB — CBC AND DIFFERENTIAL
HCT: 35 % — AB (ref 36–46)
HEMOGLOBIN: 11.4 g/dL — AB (ref 12.0–16.0)
Platelets: 298 10*3/uL (ref 150–399)
WBC: 8.2 10*3/mL

## 2015-08-18 NOTE — Progress Notes (Signed)
Patient ID: Carrie Pena, female   DOB: 07-25-1928, 81 y.o.   MRN: [id number]    DATE:  08/18/2015   MRN:  [id number]  BIRTHDAY: 01-Jul-1928  Facility:  Nursing Home Location:  [facility]  Nursing Home Room Number: 105-P  LEVEL OF CARE:  SNF [phone])  Contact Information    Name Relation Home Work [place] Daughter [phone]   Darcel Bayley [phone]     Berton Bon [phone]         Chief Complaint  Patient presents with  . Hospitalization Follow-up    Physical deconditioning, COPD exacerbation, respiratory failure, CAP, hypertension, anemia, CAD and PVD    HISTORY OF PRESENT ILLNESS:  This is an 81 year old female who has been admitted to [facility] on 08/15/15 from [facility]. She was having progressive worsening of SOB with cough and wheezing. She was treated for COPD exacerbation. Chest x-ray showed no infiltrate. She was given systemic steroids IV, DuoNeb and empiric IV antibiotic.  She has been admitted for a short-term rehabilitation.  PAST MEDICAL HISTORY:  Past Medical History  Diagnosis Date  . Hypertension   . GERD (gastroesophageal reflux disease)   . Carpal tunnel syndrome   . Hyperlipidemia   . Osteoarthritis   . COPD (chronic obstructive pulmonary disease) (HCC)      CURRENT MEDICATIONS: Reviewed  Patient's Medications  New Prescriptions   No medications on file  Previous Medications   AMLODIPINE (NORVASC) 10 MG TABLET    Take 1 tablet (10 mg total) by mouth daily.   ASPIRIN 81 MG TABLET    Take 81 mg by mouth daily.     B COMPLEX VITAMINS TABLET    Take 1 tablet by mouth daily.   CHOLECALCIFEROL 1000 UNITS TABLET    Take 1,000 Units by mouth every Wednesday.    CILOSTAZOL (PLETAL) 100 MG TABLET    TAKE 1 TABLET TWICE DAILY.   CYCLOBENZAPRINE (FLEXERIL) 5 MG TABLET    Take 5 mg by mouth 3 (three) times daily as needed for muscle spasms.    DOXYCYCLINE  (VIBRA-TABS) 100 MG TABLET    Take 1 tablet (100 mg total) by mouth every 12 (twelve) hours.   FLUTICASONE-SALMETEROL (ADVAIR) 100-50 MCG/DOSE AEPB    Inhale 1 puff into the lungs 2 (two) times daily.   GUAIFENESIN (MUCINEX) 600 MG 12 HR TABLET    Take 600 mg by mouth 2 (two) times daily as needed for cough or to loosen phlegm.   IBANDRONATE (BONIVA) 150 MG TABLET    Take 150 mg by mouth every 30 (thirty) days. Take in the morning with a full glass of water, on an empty stomach, and do not take anything else by mouth or lie down for the next 30 min.   IPRATROPIUM-ALBUTEROL (DUONEB) 0.5-2.5 (3) MG/3ML SOLN    Take 3 mLs by nebulization every 4 (four) hours as needed.   LOSARTAN (COZAAR) 100 MG TABLET    Take 1 tablet (100 mg total) by mouth every evening.   PREDNISONE (STERAPRED UNI-PAK 21 TAB) 10 MG (21) TBPK TABLET    Take 6-5-4-3-2-1 tablets by mouth daily till gone.   SIMVASTATIN (ZOCOR) 40 MG TABLET    Take 40 mg by mouth daily at 6 PM.   VITAMIN C (ASCORBIC ACID) 500 MG TABLET    Take 500 mg by mouth daily.  Modified Medications   No medications on file  Discontinued Medications   No medications  on file    Allergies  Allergen Reactions  . Tylenol [Acetaminophen] Other (See Comments)    Pt states "It makes me shake"     REVIEW OF SYSTEMS:  GENERAL: no change in appetite, no fatigue, no weight changes, no fever, chills or weakness EYES: Denies change in vision, dry eyes, eye pain, itching or discharge EARS: Denies change in hearing, ringing in ears, or earache NOSE: Denies nasal congestion or epistaxis MOUTH and THROAT: Denies oral discomfort, gingival pain or bleeding, pain from teeth or hoarseness   RESPIRATORY: no cough, SOB, DOE, wheezing, hemoptysis CARDIAC: no chest pain, edema or palpitations GI: no abdominal pain, diarrhea, constipation, heart burn, nausea or vomiting GU: Denies dysuria, frequency, hematuria, incontinence, or discharge PSYCHIATRIC: Denies feeling of  depression or anxiety. No report of hallucinations, insomnia, paranoia, or agitation   PHYSICAL EXAMINATION  GENERAL APPEARANCE: Well nourished. In no acute distress. Normal body habitus HEAD: Normal in size and contour. No evidence of trauma EYES: Lids open and close normally. No blepharitis, entropion or ectropion. PERRL. Conjunctivae are clear and sclerae are white. Lenses are without opacity EARS: Pinnae are normal. Patient hears normal voice tunes of the examiner MOUTH and THROAT: Lips are without lesions. Oral mucosa is moist and without lesions. Tongue is normal in shape, size, and color and without lesions NECK: supple, trachea midline, no neck masses, no thyroid tenderness, no thyromegaly LYMPHATICS: no LAN in the neck, no supraclavicular LAN RESPIRATORY: breathing is even & unlabored, BS hasCARDIAC: RRR, no murmur,no extra heart sounds, no edema GI: abdomen soft, normal BS, no masses, no tenderness, no hepatomegaly, no splenomegaly EXTREMITIES:  Able to move X 4 extremities PSYCHIATRIC: Alert and oriented X 3. Affect and behavior are appropriate  LABS/RADIOLOGY: Labs reviewed: Basic Metabolic Panel:  Recent Labs  16/07/9609/26/16 1459 08/13/15 0520 08/15/15 0508  NA 135 135 137  K 3.4* 3.6 3.8  CL 101 101 103  CO2 27 28 31   GLUCOSE 107* 168* 110*  BUN 6 15 26*  CREATININE 0.69 0.93 0.95  CALCIUM 9.0 9.0 8.4*   CBC:  Recent Labs  05/25/15 2053 08/12/15 1459 08/13/15 0520 08/15/15 0508  WBC 9.2 8.0 7.8 10.2  NEUTROABS 5.8  --   --   --   HGB 12.2 11.2* 10.4* 9.9*  HCT 36.8 34.1* 32.4* 31.2*  MCV 98.1 98.8 99.7 99.4  PLT 262 228 237 236      Dg Chest Port 1 View  08/12/2015  CLINICAL DATA:  81 year old female shortness of breath. Former smoker. COPD. Initial encounter. EXAM: PORTABLE CHEST 1 VIEW COMPARISON:  88,016 and earlier. FINDINGS: Portable AP semi upright view at 1459 hours. Stable tortuous and calcified thoracic aorta. Stable cardiac size and mediastinal  contours. Stable hyperinflation. No pneumothorax, pulmonary edema, pleural effusion or confluent pulmonary opacity. Degenerative changes Re identified at both shoulders. IMPRESSION: Stable chronic lung disease. No superimposed acute findings are identified. Electronically Signed   By: Odessa FlemingH  Hall M.D.   On: 08/12/2015 15:10    ASSESSMENT/PLAN:  Physical deconditioning - for rehabilitation  COPD exacerbation - continue prednisone pak tapering dose an continue Pletal 100 mg 1 tab by mouth twice a day, aspirin 81 mg byd DuoNeb every 4 hours when necessary  Acute hypoxic respiratory failure - continue O2 at 3 L/min via   CAP - given IV ceftriaxone and azithromycin then changed to doxycycline 100 mg by mouth every 12 hours 5 days  Hypertension - recently increased losartan to 100 mg by mouth daily  and added Norvasc 10 mg by mouth daily  Anemia of chronic disease - hemoglobin 9.9; check CBC  CAD - continue aspirin 81 mg by mouth daily  Bilateral PVD - continue Pletal 100 mg 1 tab by mouth twice a day, aspirin 81 mg by mouth daily and Flexeril 5 mg 1 tab by mouth 3 times a day when necessary      Goals of care:  Short-term rehabilitation    [facility], NP [facility] [phone]

## 2015-08-21 ENCOUNTER — Encounter: Payer: Self-pay | Admitting: Internal Medicine

## 2015-08-21 ENCOUNTER — Non-Acute Institutional Stay (SKILLED_NURSING_FACILITY): Payer: Medicare Other | Admitting: Internal Medicine

## 2015-08-21 DIAGNOSIS — R5381 Other malaise: Secondary | ICD-10-CM

## 2015-08-21 DIAGNOSIS — K219 Gastro-esophageal reflux disease without esophagitis: Secondary | ICD-10-CM | POA: Diagnosis not present

## 2015-08-21 DIAGNOSIS — J189 Pneumonia, unspecified organism: Secondary | ICD-10-CM

## 2015-08-21 DIAGNOSIS — K5900 Constipation, unspecified: Secondary | ICD-10-CM

## 2015-08-21 DIAGNOSIS — M810 Age-related osteoporosis without current pathological fracture: Secondary | ICD-10-CM

## 2015-08-21 DIAGNOSIS — I10 Essential (primary) hypertension: Secondary | ICD-10-CM | POA: Diagnosis not present

## 2015-08-21 DIAGNOSIS — J441 Chronic obstructive pulmonary disease with (acute) exacerbation: Secondary | ICD-10-CM | POA: Diagnosis not present

## 2015-08-21 DIAGNOSIS — D638 Anemia in other chronic diseases classified elsewhere: Secondary | ICD-10-CM | POA: Diagnosis not present

## 2015-08-21 DIAGNOSIS — I739 Peripheral vascular disease, unspecified: Secondary | ICD-10-CM

## 2015-08-21 DIAGNOSIS — J9601 Acute respiratory failure with hypoxia: Secondary | ICD-10-CM | POA: Diagnosis not present

## 2015-08-21 NOTE — Progress Notes (Signed)
Patient ID: Carrie Pena, female   DOB: 1928/02/03, 81 y.o.   MRN: [id number]     [facility]  PCP: Thayer Headings, MD  Code Status: DNR  Allergies  Allergen Reactions  . Tylenol [Acetaminophen] Other (See Comments)    Pt states "It makes me shake"    Chief Complaint  Patient presents with  . New Admit To SNF    New Admission      HPI:  81 y.o. patient is here for short term rehabilitation post hospital admission from 08/12/15-08/15/15 with acute respiratory failure in setting of copd exacerbation and community acquired pneumonia. She was started on o2, nebulizer treatment, steroids and antibiotics.She has PMH of COPD, HTN, cognitive impairment. She is seen in her room today. She has occasional cough with yellow phlegm. Breathing has improved and is on o2. Has shortness of breath with minimal exertion. She complaints of constipation. She gets tired easily and is working with therapy team.  Review of Systems:  Constitutional: Negative for fever, chills, diaphoresis.  HENT: Negative for headache, congestion, nasal discharge Eyes: Negative for eye pain, blurred vision, double vision and discharge.  Respiratory: positive for cough, shortness of breath and wheezing.   Cardiovascular: Negative for chest pain, palpitations. Has chronic leg swelling.  Gastrointestinal: Positive for heartburn. Negative for nausea, vomiting, abdominal pain. Genitourinary: Negative for dysuria, flank pain.  Musculoskeletal: Negative for back pain, falls Skin: Negative for itching, rash.  Neurological: Negative for dizziness, tingling, focal weakness Psychiatric/Behavioral: Negative for depression    Past Medical History  Diagnosis Date  . Hypertension   . GERD (gastroesophageal reflux disease)   . Carpal tunnel syndrome   . Hyperlipidemia   . Osteoarthritis   . COPD (chronic obstructive pulmonary disease) [facility])    Past Surgical History  Procedure Laterality Date  .  Tonsillectomy    . Knee arthroscopy    . Left carpal tunnel     Social History:   reports that she quit smoking about 5 months ago. Her smoking use included Cigarettes. She smoked 0.50 packs per day. She does not have any smokeless tobacco history on file. She reports that she does not drink alcohol or use illicit drugs.  Family History  Problem Relation Age of Onset  . Cancer Mother   . Cancer Father   . Hypertension Daughter   . Heart disease Daughter     before age 41    Medications:   Medication List       This list is accurate as of: 08/21/15 12:33 PM.  Always use your most recent med list.               amLODipine 10 MG tablet  Commonly known as:  NORVASC  Take 1 tablet (10 mg total) by mouth daily.     aspirin 81 MG tablet  Take 81 mg by mouth daily.     b complex vitamins tablet  Take 1 tablet by mouth daily.     budesonide-formoterol 160-4.5 MCG/ACT inhaler  Commonly known as:  SYMBICORT  Inhale 2 puffs into the lungs 2 (two) times daily.     Cholecalciferol 1000 UNITS tablet  Take 1,000 Units by mouth every Wednesday.     cilostazol 100 MG tablet  Commonly known as:  PLETAL  TAKE 1 TABLET TWICE DAILY.     cyclobenzaprine 5 MG tablet  Commonly known as:  FLEXERIL  Take 5 mg by mouth 3 (three) times daily as needed for muscle spasms.  doxycycline 100 MG tablet  Commonly known as:  VIBRA-TABS  Take 1 tablet (100 mg total) by mouth every 12 (twelve) hours.     guaiFENesin 600 MG 12 hr tablet  Commonly known as:  MUCINEX  Take 600 mg by mouth 2 (two) times daily as needed for cough or to loosen phlegm.     ibandronate 150 MG tablet  Commonly known as:  BONIVA  Take 150 mg by mouth every 30 (thirty) days. Take in the morning with a full glass of water, on an empty stomach, and do not take anything else by mouth or lie down for the next 30 min.     ipratropium-albuterol 0.5-2.5 (3) MG/3ML Soln  Commonly known as:  DUONEB  Take 3 mLs by  nebulization every 4 (four) hours as needed.     losartan 100 MG tablet  Commonly known as:  COZAAR  Take 1 tablet (100 mg total) by mouth every evening.     predniSONE 10 MG (21) Tbpk tablet  Commonly known as:  STERAPRED UNI-PAK 21 TAB  Take 6-5-4-3-2-1 tablets by mouth daily till gone.     PROCEL Powd  Take 2 scoop by mouth 2 (two) times daily.     simvastatin 40 MG tablet  Commonly known as:  ZOCOR  Take 40 mg by mouth daily at 6 PM.     vitamin C 500 MG tablet  Commonly known as:  ASCORBIC ACID  Take 500 mg by mouth daily.         Physical Exam: Filed Vitals:   08/21/15 1223  BP: 137/69  Pulse: 62  Temp: 98.7 F (37.1 C)  TempSrc: Oral  Resp: 21  Height:  (1.575 m)  Weight: 132 lb 6.4 oz (60.056 kg)  SpO2: 99%    General- elderly female, frail, in no acute distress Head- normocephalic, atraumatic Nose- normal nasal mucosa, no maxillary or frontal sinus tenderness, no nasal discharge Throat- moist mucus membrane Eyes- PERRLA, EOMI, no pallor, no icterus, no discharge, normal conjunctiva, normal sclera Neck- no cervical lymphadenopathy Cardiovascular- normal s1,s2, no murmurs, 1+ ankle and trace leg edema Respiratory- bilateral poor air entry with expiratory wheezing, no rhonchi, no crackles, no use of accessory muscles, on o2 Abdomen- bowel sounds present, soft, non tender Musculoskeletal- able to move all 4 extremities, generalized weakness more in LE than UE and left UE ROM limited Neurological- no focal deficit, alert and oriented to person and place Skin- warm and dry Psychiatry- normal mood and affect    Labs reviewed: Basic Metabolic Panel:  Recent Labs  16/10/96 1459 08/13/15 0520 08/15/15 0508 08/18/15  NA 135 135 137 138  K 3.4* 3.6 3.8 3.9  CL 101 101 103  --   CO2 --   GLUCOSE 107* 168* 110*  --   BUN 6 15 26* 25*  CREATININE 0.69 0.93 0.95 0.9  CALCIUM 9.0 9.0 8.4*  --    Liver Function Tests:  Recent Labs   08/18/15  AST 14  ALT 19  ALKPHOS 71   No results for input(s): LIPASE, AMYLASE in the last 8760 hours. No results for input(s): AMMONIA in the last 8760 hours. CBC:  Recent Labs  05/25/15 2053 08/12/15 1459 08/13/15 0520 08/15/15 0508 08/18/15  WBC 9.2 8.0 7.8 10.2 8.2  NEUTROABS 5.8  --   --   --   --   HGB 12.2 11.2* 10.4* 9.9* 11.4*  HCT 36.8 34.1* 32.4* 31.2* 35*  MCV  98.1 98.8 99.7 99.4  --   PLT 262 228 237 236 298   Cardiac Enzymes: No results for input(s): CKTOTAL, CKMB, CKMBINDEX, TROPONINI in the last 8760 hours. BNP: Invalid input(s): POCBNP CBG: No results for input(s): GLUCAP in the last 8760 hours.  Radiological Exams: Dg Chest Port 1 View  08/12/2015  CLINICAL DATA:  81 year old female shortness of breath. Former smoker. COPD. Initial encounter. EXAM: PORTABLE CHEST 1 VIEW COMPARISON:  88,016 and earlier. FINDINGS: Portable AP semi upright view at 1459 hours. Stable tortuous and calcified thoracic aorta. Stable cardiac size and mediastinal contours. Stable hyperinflation. No pneumothorax, pulmonary edema, pleural effusion or confluent pulmonary opacity. Degenerative changes Re identified at both shoulders. IMPRESSION: Stable chronic lung disease. No superimposed acute findings are identified. Electronically Signed   By: Odessa FlemingH  Hall M.D.   On: 08/12/2015 15:10    Assessment/Plan   Physical deconditioning Will have her work with physical therapy and occupational therapy team to help with gait training and muscle strengthening exercises.fall precautions. Skin care. Encourage to be out of bed.   Acute respiratory failure From copd exacerbation and CAP. Completed her antibiotics. Continue breathing treatment and o2 for now. Continue mucinex bid prn for cough. Improving.  COPD exacerbation Completed antibiotic and prednisone taper course. Continue duoneb but change to scheduled tid with prn for dyspnea as needed. Continue o2 and advair  CAP Completed her  antibiotics course. Pulmonary toileting, encouraged to use incentive spirometer  gerd Start protonix 20 mg daily, to sit upright after meals for atleast 30 min, reassess  Constipation Add miralax 17 mg daily and senna s 1 tab daily and reassess  HTN Stable reading, monitor bp, continue cozaar 100 mg daily and amlodipine 10 mg daily. Reviewed BMP, stable  Anemia of chronic disease Improved Hb on check in the facility. Monitor clinically.  Osteoporosis Continue boniva monthly dosing and vitamin d supplement  PAD Continue aspirin 81 mg daily, pletal 100 mg bid for now with zocor 40 mg daily    Goals of care: short term rehabilitation   Labs/tests ordered: none  Family/ staff Communication: reviewed care plan with patient and nursing supervisor    Oneal GroutMAHIMA Lanna Labella, MD  [facility] [phone] (Monday-Friday 8 am - 5 pm) [phone] (afterhours)

## 2015-08-22 DIAGNOSIS — I10 Essential (primary) hypertension: Secondary | ICD-10-CM | POA: Insufficient documentation

## 2015-09-04 ENCOUNTER — Non-Acute Institutional Stay (SKILLED_NURSING_FACILITY): Payer: Medicare Other | Admitting: Adult Health

## 2015-09-04 DIAGNOSIS — I739 Peripheral vascular disease, unspecified: Secondary | ICD-10-CM

## 2015-09-04 DIAGNOSIS — J441 Chronic obstructive pulmonary disease with (acute) exacerbation: Secondary | ICD-10-CM

## 2015-09-04 DIAGNOSIS — D638 Anemia in other chronic diseases classified elsewhere: Secondary | ICD-10-CM

## 2015-09-04 DIAGNOSIS — J189 Pneumonia, unspecified organism: Secondary | ICD-10-CM

## 2015-09-04 DIAGNOSIS — I10 Essential (primary) hypertension: Secondary | ICD-10-CM

## 2015-09-04 DIAGNOSIS — K219 Gastro-esophageal reflux disease without esophagitis: Secondary | ICD-10-CM | POA: Diagnosis not present

## 2015-09-04 DIAGNOSIS — R5381 Other malaise: Secondary | ICD-10-CM | POA: Diagnosis not present

## 2015-09-04 DIAGNOSIS — I2510 Atherosclerotic heart disease of native coronary artery without angina pectoris: Secondary | ICD-10-CM | POA: Diagnosis not present

## 2015-09-05 DIAGNOSIS — I10 Essential (primary) hypertension: Secondary | ICD-10-CM | POA: Diagnosis not present

## 2015-09-05 DIAGNOSIS — J449 Chronic obstructive pulmonary disease, unspecified: Secondary | ICD-10-CM | POA: Diagnosis not present

## 2015-09-05 DIAGNOSIS — M6281 Muscle weakness (generalized): Secondary | ICD-10-CM | POA: Diagnosis not present

## 2015-09-05 DIAGNOSIS — Z87891 Personal history of nicotine dependence: Secondary | ICD-10-CM | POA: Diagnosis not present

## 2015-09-05 DIAGNOSIS — M170 Bilateral primary osteoarthritis of knee: Secondary | ICD-10-CM | POA: Diagnosis not present

## 2015-09-05 DIAGNOSIS — I739 Peripheral vascular disease, unspecified: Secondary | ICD-10-CM | POA: Diagnosis not present

## 2015-09-06 DIAGNOSIS — M6281 Muscle weakness (generalized): Secondary | ICD-10-CM | POA: Diagnosis not present

## 2015-09-06 DIAGNOSIS — I10 Essential (primary) hypertension: Secondary | ICD-10-CM | POA: Diagnosis not present

## 2015-09-06 DIAGNOSIS — Z87891 Personal history of nicotine dependence: Secondary | ICD-10-CM | POA: Diagnosis not present

## 2015-09-06 DIAGNOSIS — I739 Peripheral vascular disease, unspecified: Secondary | ICD-10-CM | POA: Diagnosis not present

## 2015-09-06 DIAGNOSIS — M170 Bilateral primary osteoarthritis of knee: Secondary | ICD-10-CM | POA: Diagnosis not present

## 2015-09-06 DIAGNOSIS — J449 Chronic obstructive pulmonary disease, unspecified: Secondary | ICD-10-CM | POA: Diagnosis not present

## 2015-09-09 DIAGNOSIS — Z87891 Personal history of nicotine dependence: Secondary | ICD-10-CM | POA: Diagnosis not present

## 2015-09-09 DIAGNOSIS — J449 Chronic obstructive pulmonary disease, unspecified: Secondary | ICD-10-CM | POA: Diagnosis not present

## 2015-09-09 DIAGNOSIS — M6281 Muscle weakness (generalized): Secondary | ICD-10-CM | POA: Diagnosis not present

## 2015-09-09 DIAGNOSIS — M170 Bilateral primary osteoarthritis of knee: Secondary | ICD-10-CM | POA: Diagnosis not present

## 2015-09-09 DIAGNOSIS — I10 Essential (primary) hypertension: Secondary | ICD-10-CM | POA: Diagnosis not present

## 2015-09-09 DIAGNOSIS — I739 Peripheral vascular disease, unspecified: Secondary | ICD-10-CM | POA: Diagnosis not present

## 2015-09-14 DIAGNOSIS — Z87891 Personal history of nicotine dependence: Secondary | ICD-10-CM | POA: Diagnosis not present

## 2015-09-14 DIAGNOSIS — I739 Peripheral vascular disease, unspecified: Secondary | ICD-10-CM | POA: Diagnosis not present

## 2015-09-14 DIAGNOSIS — M6281 Muscle weakness (generalized): Secondary | ICD-10-CM | POA: Diagnosis not present

## 2015-09-14 DIAGNOSIS — J449 Chronic obstructive pulmonary disease, unspecified: Secondary | ICD-10-CM | POA: Diagnosis not present

## 2015-09-14 DIAGNOSIS — M170 Bilateral primary osteoarthritis of knee: Secondary | ICD-10-CM | POA: Diagnosis not present

## 2015-09-14 DIAGNOSIS — I10 Essential (primary) hypertension: Secondary | ICD-10-CM | POA: Diagnosis not present

## 2015-09-15 DIAGNOSIS — I10 Essential (primary) hypertension: Secondary | ICD-10-CM | POA: Diagnosis not present

## 2015-09-15 DIAGNOSIS — I739 Peripheral vascular disease, unspecified: Secondary | ICD-10-CM | POA: Diagnosis not present

## 2015-09-15 DIAGNOSIS — J449 Chronic obstructive pulmonary disease, unspecified: Secondary | ICD-10-CM | POA: Diagnosis not present

## 2015-09-15 DIAGNOSIS — M6281 Muscle weakness (generalized): Secondary | ICD-10-CM | POA: Diagnosis not present

## 2015-09-15 DIAGNOSIS — M170 Bilateral primary osteoarthritis of knee: Secondary | ICD-10-CM | POA: Diagnosis not present

## 2015-09-15 DIAGNOSIS — Z87891 Personal history of nicotine dependence: Secondary | ICD-10-CM | POA: Diagnosis not present

## 2015-09-16 DIAGNOSIS — M6281 Muscle weakness (generalized): Secondary | ICD-10-CM | POA: Diagnosis not present

## 2015-09-16 DIAGNOSIS — M170 Bilateral primary osteoarthritis of knee: Secondary | ICD-10-CM | POA: Diagnosis not present

## 2015-09-16 DIAGNOSIS — I10 Essential (primary) hypertension: Secondary | ICD-10-CM | POA: Diagnosis not present

## 2015-09-16 DIAGNOSIS — I739 Peripheral vascular disease, unspecified: Secondary | ICD-10-CM | POA: Diagnosis not present

## 2015-09-16 DIAGNOSIS — J449 Chronic obstructive pulmonary disease, unspecified: Secondary | ICD-10-CM | POA: Diagnosis not present

## 2015-09-16 DIAGNOSIS — Z87891 Personal history of nicotine dependence: Secondary | ICD-10-CM | POA: Diagnosis not present

## 2015-09-18 DIAGNOSIS — I739 Peripheral vascular disease, unspecified: Secondary | ICD-10-CM | POA: Diagnosis not present

## 2015-09-18 DIAGNOSIS — Z87891 Personal history of nicotine dependence: Secondary | ICD-10-CM | POA: Diagnosis not present

## 2015-09-18 DIAGNOSIS — M170 Bilateral primary osteoarthritis of knee: Secondary | ICD-10-CM | POA: Diagnosis not present

## 2015-09-18 DIAGNOSIS — I10 Essential (primary) hypertension: Secondary | ICD-10-CM | POA: Diagnosis not present

## 2015-09-18 DIAGNOSIS — J449 Chronic obstructive pulmonary disease, unspecified: Secondary | ICD-10-CM | POA: Diagnosis not present

## 2015-09-18 DIAGNOSIS — M6281 Muscle weakness (generalized): Secondary | ICD-10-CM | POA: Diagnosis not present

## 2015-09-21 DIAGNOSIS — Z87891 Personal history of nicotine dependence: Secondary | ICD-10-CM | POA: Diagnosis not present

## 2015-09-21 DIAGNOSIS — M170 Bilateral primary osteoarthritis of knee: Secondary | ICD-10-CM | POA: Diagnosis not present

## 2015-09-21 DIAGNOSIS — I10 Essential (primary) hypertension: Secondary | ICD-10-CM | POA: Diagnosis not present

## 2015-09-21 DIAGNOSIS — M6281 Muscle weakness (generalized): Secondary | ICD-10-CM | POA: Diagnosis not present

## 2015-09-21 DIAGNOSIS — I739 Peripheral vascular disease, unspecified: Secondary | ICD-10-CM | POA: Diagnosis not present

## 2015-09-21 DIAGNOSIS — J449 Chronic obstructive pulmonary disease, unspecified: Secondary | ICD-10-CM | POA: Diagnosis not present

## 2015-09-22 DIAGNOSIS — J449 Chronic obstructive pulmonary disease, unspecified: Secondary | ICD-10-CM | POA: Diagnosis not present

## 2015-09-22 DIAGNOSIS — Z87891 Personal history of nicotine dependence: Secondary | ICD-10-CM | POA: Diagnosis not present

## 2015-09-22 DIAGNOSIS — I10 Essential (primary) hypertension: Secondary | ICD-10-CM | POA: Diagnosis not present

## 2015-09-22 DIAGNOSIS — M170 Bilateral primary osteoarthritis of knee: Secondary | ICD-10-CM | POA: Diagnosis not present

## 2015-09-22 DIAGNOSIS — M6281 Muscle weakness (generalized): Secondary | ICD-10-CM | POA: Diagnosis not present

## 2015-09-22 DIAGNOSIS — I739 Peripheral vascular disease, unspecified: Secondary | ICD-10-CM | POA: Diagnosis not present

## 2015-09-23 DIAGNOSIS — I739 Peripheral vascular disease, unspecified: Secondary | ICD-10-CM | POA: Diagnosis not present

## 2015-09-23 DIAGNOSIS — M170 Bilateral primary osteoarthritis of knee: Secondary | ICD-10-CM | POA: Diagnosis not present

## 2015-09-23 DIAGNOSIS — I10 Essential (primary) hypertension: Secondary | ICD-10-CM | POA: Diagnosis not present

## 2015-09-23 DIAGNOSIS — M6281 Muscle weakness (generalized): Secondary | ICD-10-CM | POA: Diagnosis not present

## 2015-09-23 DIAGNOSIS — J449 Chronic obstructive pulmonary disease, unspecified: Secondary | ICD-10-CM | POA: Diagnosis not present

## 2015-09-23 DIAGNOSIS — Z87891 Personal history of nicotine dependence: Secondary | ICD-10-CM | POA: Diagnosis not present

## 2015-09-24 DIAGNOSIS — M6281 Muscle weakness (generalized): Secondary | ICD-10-CM | POA: Diagnosis not present

## 2015-09-24 DIAGNOSIS — J449 Chronic obstructive pulmonary disease, unspecified: Secondary | ICD-10-CM | POA: Diagnosis not present

## 2015-09-24 DIAGNOSIS — M170 Bilateral primary osteoarthritis of knee: Secondary | ICD-10-CM | POA: Diagnosis not present

## 2015-09-24 DIAGNOSIS — I739 Peripheral vascular disease, unspecified: Secondary | ICD-10-CM | POA: Diagnosis not present

## 2015-09-24 DIAGNOSIS — Z87891 Personal history of nicotine dependence: Secondary | ICD-10-CM | POA: Diagnosis not present

## 2015-09-24 DIAGNOSIS — I10 Essential (primary) hypertension: Secondary | ICD-10-CM | POA: Diagnosis not present

## 2015-09-28 DIAGNOSIS — I739 Peripheral vascular disease, unspecified: Secondary | ICD-10-CM | POA: Diagnosis not present

## 2015-09-28 DIAGNOSIS — J449 Chronic obstructive pulmonary disease, unspecified: Secondary | ICD-10-CM | POA: Diagnosis not present

## 2015-09-28 DIAGNOSIS — M170 Bilateral primary osteoarthritis of knee: Secondary | ICD-10-CM | POA: Diagnosis not present

## 2015-09-28 DIAGNOSIS — Z87891 Personal history of nicotine dependence: Secondary | ICD-10-CM | POA: Diagnosis not present

## 2015-09-28 DIAGNOSIS — I10 Essential (primary) hypertension: Secondary | ICD-10-CM | POA: Diagnosis not present

## 2015-09-28 DIAGNOSIS — M6281 Muscle weakness (generalized): Secondary | ICD-10-CM | POA: Diagnosis not present

## 2015-09-30 DIAGNOSIS — I10 Essential (primary) hypertension: Secondary | ICD-10-CM | POA: Diagnosis not present

## 2015-09-30 DIAGNOSIS — I739 Peripheral vascular disease, unspecified: Secondary | ICD-10-CM | POA: Diagnosis not present

## 2015-09-30 DIAGNOSIS — Z87891 Personal history of nicotine dependence: Secondary | ICD-10-CM | POA: Diagnosis not present

## 2015-09-30 DIAGNOSIS — M6281 Muscle weakness (generalized): Secondary | ICD-10-CM | POA: Diagnosis not present

## 2015-09-30 DIAGNOSIS — J449 Chronic obstructive pulmonary disease, unspecified: Secondary | ICD-10-CM | POA: Diagnosis not present

## 2015-09-30 DIAGNOSIS — M170 Bilateral primary osteoarthritis of knee: Secondary | ICD-10-CM | POA: Diagnosis not present

## 2015-10-01 DIAGNOSIS — I739 Peripheral vascular disease, unspecified: Secondary | ICD-10-CM | POA: Diagnosis not present

## 2015-10-01 DIAGNOSIS — Z87891 Personal history of nicotine dependence: Secondary | ICD-10-CM | POA: Diagnosis not present

## 2015-10-01 DIAGNOSIS — M170 Bilateral primary osteoarthritis of knee: Secondary | ICD-10-CM | POA: Diagnosis not present

## 2015-10-01 DIAGNOSIS — J449 Chronic obstructive pulmonary disease, unspecified: Secondary | ICD-10-CM | POA: Diagnosis not present

## 2015-10-01 DIAGNOSIS — I10 Essential (primary) hypertension: Secondary | ICD-10-CM | POA: Diagnosis not present

## 2015-10-01 DIAGNOSIS — M6281 Muscle weakness (generalized): Secondary | ICD-10-CM | POA: Diagnosis not present

## 2015-10-02 DIAGNOSIS — Z87891 Personal history of nicotine dependence: Secondary | ICD-10-CM | POA: Diagnosis not present

## 2015-10-02 DIAGNOSIS — J449 Chronic obstructive pulmonary disease, unspecified: Secondary | ICD-10-CM | POA: Diagnosis not present

## 2015-10-02 DIAGNOSIS — M6281 Muscle weakness (generalized): Secondary | ICD-10-CM | POA: Diagnosis not present

## 2015-10-02 DIAGNOSIS — I739 Peripheral vascular disease, unspecified: Secondary | ICD-10-CM | POA: Diagnosis not present

## 2015-10-02 DIAGNOSIS — I10 Essential (primary) hypertension: Secondary | ICD-10-CM | POA: Diagnosis not present

## 2015-10-02 DIAGNOSIS — M170 Bilateral primary osteoarthritis of knee: Secondary | ICD-10-CM | POA: Diagnosis not present

## 2015-10-05 DIAGNOSIS — I10 Essential (primary) hypertension: Secondary | ICD-10-CM | POA: Diagnosis not present

## 2015-10-05 DIAGNOSIS — Z87891 Personal history of nicotine dependence: Secondary | ICD-10-CM | POA: Diagnosis not present

## 2015-10-05 DIAGNOSIS — M6281 Muscle weakness (generalized): Secondary | ICD-10-CM | POA: Diagnosis not present

## 2015-10-05 DIAGNOSIS — I739 Peripheral vascular disease, unspecified: Secondary | ICD-10-CM | POA: Diagnosis not present

## 2015-10-05 DIAGNOSIS — J449 Chronic obstructive pulmonary disease, unspecified: Secondary | ICD-10-CM | POA: Diagnosis not present

## 2015-10-05 DIAGNOSIS — M170 Bilateral primary osteoarthritis of knee: Secondary | ICD-10-CM | POA: Diagnosis not present

## 2015-10-06 DIAGNOSIS — Z87891 Personal history of nicotine dependence: Secondary | ICD-10-CM | POA: Diagnosis not present

## 2015-10-06 DIAGNOSIS — I739 Peripheral vascular disease, unspecified: Secondary | ICD-10-CM | POA: Diagnosis not present

## 2015-10-06 DIAGNOSIS — I10 Essential (primary) hypertension: Secondary | ICD-10-CM | POA: Diagnosis not present

## 2015-10-06 DIAGNOSIS — M170 Bilateral primary osteoarthritis of knee: Secondary | ICD-10-CM | POA: Diagnosis not present

## 2015-10-06 DIAGNOSIS — M6281 Muscle weakness (generalized): Secondary | ICD-10-CM | POA: Diagnosis not present

## 2015-10-06 DIAGNOSIS — J449 Chronic obstructive pulmonary disease, unspecified: Secondary | ICD-10-CM | POA: Diagnosis not present

## 2015-10-08 DIAGNOSIS — I10 Essential (primary) hypertension: Secondary | ICD-10-CM | POA: Diagnosis not present

## 2015-10-08 DIAGNOSIS — M170 Bilateral primary osteoarthritis of knee: Secondary | ICD-10-CM | POA: Diagnosis not present

## 2015-10-08 DIAGNOSIS — Z87891 Personal history of nicotine dependence: Secondary | ICD-10-CM | POA: Diagnosis not present

## 2015-10-08 DIAGNOSIS — J449 Chronic obstructive pulmonary disease, unspecified: Secondary | ICD-10-CM | POA: Diagnosis not present

## 2015-10-08 DIAGNOSIS — I739 Peripheral vascular disease, unspecified: Secondary | ICD-10-CM | POA: Diagnosis not present

## 2015-10-08 DIAGNOSIS — M6281 Muscle weakness (generalized): Secondary | ICD-10-CM | POA: Diagnosis not present

## 2015-10-09 DIAGNOSIS — Z87891 Personal history of nicotine dependence: Secondary | ICD-10-CM | POA: Diagnosis not present

## 2015-10-09 DIAGNOSIS — M6281 Muscle weakness (generalized): Secondary | ICD-10-CM | POA: Diagnosis not present

## 2015-10-09 DIAGNOSIS — I739 Peripheral vascular disease, unspecified: Secondary | ICD-10-CM | POA: Diagnosis not present

## 2015-10-09 DIAGNOSIS — I10 Essential (primary) hypertension: Secondary | ICD-10-CM | POA: Diagnosis not present

## 2015-10-09 DIAGNOSIS — J449 Chronic obstructive pulmonary disease, unspecified: Secondary | ICD-10-CM | POA: Diagnosis not present

## 2015-10-09 DIAGNOSIS — M170 Bilateral primary osteoarthritis of knee: Secondary | ICD-10-CM | POA: Diagnosis not present

## 2015-10-13 DIAGNOSIS — J449 Chronic obstructive pulmonary disease, unspecified: Secondary | ICD-10-CM | POA: Diagnosis not present

## 2015-10-13 DIAGNOSIS — M170 Bilateral primary osteoarthritis of knee: Secondary | ICD-10-CM | POA: Diagnosis not present

## 2015-10-13 DIAGNOSIS — I739 Peripheral vascular disease, unspecified: Secondary | ICD-10-CM | POA: Diagnosis not present

## 2015-10-13 DIAGNOSIS — I10 Essential (primary) hypertension: Secondary | ICD-10-CM | POA: Diagnosis not present

## 2015-10-13 DIAGNOSIS — M6281 Muscle weakness (generalized): Secondary | ICD-10-CM | POA: Diagnosis not present

## 2015-10-13 DIAGNOSIS — Z87891 Personal history of nicotine dependence: Secondary | ICD-10-CM | POA: Diagnosis not present

## 2015-10-15 DIAGNOSIS — M170 Bilateral primary osteoarthritis of knee: Secondary | ICD-10-CM | POA: Diagnosis not present

## 2015-10-15 DIAGNOSIS — J449 Chronic obstructive pulmonary disease, unspecified: Secondary | ICD-10-CM | POA: Diagnosis not present

## 2015-10-15 DIAGNOSIS — Z87891 Personal history of nicotine dependence: Secondary | ICD-10-CM | POA: Diagnosis not present

## 2015-10-15 DIAGNOSIS — I10 Essential (primary) hypertension: Secondary | ICD-10-CM | POA: Diagnosis not present

## 2015-10-15 DIAGNOSIS — M6281 Muscle weakness (generalized): Secondary | ICD-10-CM | POA: Diagnosis not present

## 2015-10-15 DIAGNOSIS — I739 Peripheral vascular disease, unspecified: Secondary | ICD-10-CM | POA: Diagnosis not present

## 2015-10-16 DIAGNOSIS — M6281 Muscle weakness (generalized): Secondary | ICD-10-CM | POA: Diagnosis not present

## 2015-10-16 DIAGNOSIS — I10 Essential (primary) hypertension: Secondary | ICD-10-CM | POA: Diagnosis not present

## 2015-10-16 DIAGNOSIS — I739 Peripheral vascular disease, unspecified: Secondary | ICD-10-CM | POA: Diagnosis not present

## 2015-10-16 DIAGNOSIS — Z87891 Personal history of nicotine dependence: Secondary | ICD-10-CM | POA: Diagnosis not present

## 2015-10-16 DIAGNOSIS — J449 Chronic obstructive pulmonary disease, unspecified: Secondary | ICD-10-CM | POA: Diagnosis not present

## 2015-10-16 DIAGNOSIS — M170 Bilateral primary osteoarthritis of knee: Secondary | ICD-10-CM | POA: Diagnosis not present

## 2015-10-21 DIAGNOSIS — M6281 Muscle weakness (generalized): Secondary | ICD-10-CM | POA: Diagnosis not present

## 2015-10-21 DIAGNOSIS — M170 Bilateral primary osteoarthritis of knee: Secondary | ICD-10-CM | POA: Diagnosis not present

## 2015-10-21 DIAGNOSIS — Z87891 Personal history of nicotine dependence: Secondary | ICD-10-CM | POA: Diagnosis not present

## 2015-10-21 DIAGNOSIS — I739 Peripheral vascular disease, unspecified: Secondary | ICD-10-CM | POA: Diagnosis not present

## 2015-10-21 DIAGNOSIS — J449 Chronic obstructive pulmonary disease, unspecified: Secondary | ICD-10-CM | POA: Diagnosis not present

## 2015-10-21 DIAGNOSIS — I10 Essential (primary) hypertension: Secondary | ICD-10-CM | POA: Diagnosis not present

## 2015-10-23 DIAGNOSIS — I10 Essential (primary) hypertension: Secondary | ICD-10-CM | POA: Diagnosis not present

## 2015-10-23 DIAGNOSIS — M170 Bilateral primary osteoarthritis of knee: Secondary | ICD-10-CM | POA: Diagnosis not present

## 2015-10-23 DIAGNOSIS — J449 Chronic obstructive pulmonary disease, unspecified: Secondary | ICD-10-CM | POA: Diagnosis not present

## 2015-10-23 DIAGNOSIS — I739 Peripheral vascular disease, unspecified: Secondary | ICD-10-CM | POA: Diagnosis not present

## 2015-10-23 DIAGNOSIS — M6281 Muscle weakness (generalized): Secondary | ICD-10-CM | POA: Diagnosis not present

## 2015-10-23 DIAGNOSIS — Z87891 Personal history of nicotine dependence: Secondary | ICD-10-CM | POA: Diagnosis not present

## 2015-10-26 DIAGNOSIS — I10 Essential (primary) hypertension: Secondary | ICD-10-CM | POA: Diagnosis not present

## 2015-10-26 DIAGNOSIS — J449 Chronic obstructive pulmonary disease, unspecified: Secondary | ICD-10-CM | POA: Diagnosis not present

## 2015-10-26 DIAGNOSIS — Z87891 Personal history of nicotine dependence: Secondary | ICD-10-CM | POA: Diagnosis not present

## 2015-10-26 DIAGNOSIS — I739 Peripheral vascular disease, unspecified: Secondary | ICD-10-CM | POA: Diagnosis not present

## 2015-10-26 DIAGNOSIS — M6281 Muscle weakness (generalized): Secondary | ICD-10-CM | POA: Diagnosis not present

## 2015-10-26 DIAGNOSIS — M170 Bilateral primary osteoarthritis of knee: Secondary | ICD-10-CM | POA: Diagnosis not present

## 2015-10-27 DIAGNOSIS — I10 Essential (primary) hypertension: Secondary | ICD-10-CM | POA: Diagnosis not present

## 2015-10-27 DIAGNOSIS — M6281 Muscle weakness (generalized): Secondary | ICD-10-CM | POA: Diagnosis not present

## 2015-10-27 DIAGNOSIS — I739 Peripheral vascular disease, unspecified: Secondary | ICD-10-CM | POA: Diagnosis not present

## 2015-10-27 DIAGNOSIS — J449 Chronic obstructive pulmonary disease, unspecified: Secondary | ICD-10-CM | POA: Diagnosis not present

## 2015-10-27 DIAGNOSIS — M170 Bilateral primary osteoarthritis of knee: Secondary | ICD-10-CM | POA: Diagnosis not present

## 2015-10-27 DIAGNOSIS — Z87891 Personal history of nicotine dependence: Secondary | ICD-10-CM | POA: Diagnosis not present

## 2015-10-29 DIAGNOSIS — I10 Essential (primary) hypertension: Secondary | ICD-10-CM | POA: Diagnosis not present

## 2015-10-29 DIAGNOSIS — Z87891 Personal history of nicotine dependence: Secondary | ICD-10-CM | POA: Diagnosis not present

## 2015-10-29 DIAGNOSIS — M6281 Muscle weakness (generalized): Secondary | ICD-10-CM | POA: Diagnosis not present

## 2015-10-29 DIAGNOSIS — M170 Bilateral primary osteoarthritis of knee: Secondary | ICD-10-CM | POA: Diagnosis not present

## 2015-10-29 DIAGNOSIS — J449 Chronic obstructive pulmonary disease, unspecified: Secondary | ICD-10-CM | POA: Diagnosis not present

## 2015-10-29 DIAGNOSIS — I739 Peripheral vascular disease, unspecified: Secondary | ICD-10-CM | POA: Diagnosis not present

## 2015-11-16 DIAGNOSIS — R269 Unspecified abnormalities of gait and mobility: Secondary | ICD-10-CM | POA: Diagnosis not present

## 2015-12-11 DIAGNOSIS — R2689 Other abnormalities of gait and mobility: Secondary | ICD-10-CM | POA: Diagnosis not present

## 2015-12-11 DIAGNOSIS — I159 Secondary hypertension, unspecified: Secondary | ICD-10-CM | POA: Diagnosis not present

## 2015-12-11 DIAGNOSIS — J449 Chronic obstructive pulmonary disease, unspecified: Secondary | ICD-10-CM | POA: Diagnosis not present

## 2015-12-11 DIAGNOSIS — I110 Hypertensive heart disease with heart failure: Secondary | ICD-10-CM | POA: Diagnosis not present

## 2016-01-03 ENCOUNTER — Encounter: Payer: Self-pay | Admitting: Adult Health

## 2016-01-03 NOTE — Progress Notes (Signed)
Patient ID: Carrie Pena, female   DOB: 1928/07/26, 82 y.o.   MRN: [id number]    DATE:  09/04/15  MRN:  [id number]  BIRTHDAY: 1928/07/26  Facility:  Nursing Home [facility]  [facility]  Nursing Home Room Number: 105-P  LEVEL OF CARE:  SNF [phone])  Contact Information    Name Relation Home Work [employer]   Ezernack,Annie Daughter [phone]   Darcel Bayleyzernack,[name] Other [phone]     Berton BonLenfestey,John Other [phone]         Chief Complaint  Patient presents with  . Discharge Note    HISTORY OF PRESENT ILLNESS:  This is an 82 year old female who is for discharge home with Home health PT, OT, CNA and SW. [facility]. She was having progressive worsening of SOB with cough and wheezing. She was treated for COPD exacerbation. Chest x-ray showed no infiltrate. She was given systemic steroids IV, DuoNeb and empiric IV antibiotic.  Patient was admitted to this facility for short-term rehabilitation after the patient's recent hospitalization.  Patient has completed SNF rehabilitation and therapy has cleared the patient for discharge.e  PAST MEDICAL HISTORY:  Past Medical History  Diagnosis Date  . Hypertension   . GERD (gastroesophageal reflux disease)   . Carpal tunnel syndrome   . Hyperlipidemia   . Osteoarthritis   . COPD (chronic obstructive pulmonary disease) (HCC)      CURRENT MEDICATIONS: Reviewed  Patient's Medications  New Prescriptions   No medications on file  Previous Medications   AMLODIPINE (NORVASC) 10 MG TABLET    Take 1 tablet (10 mg total) by mouth daily.   ASPIRIN 81 MG TABLET    Take 81 mg by mouth daily.     B COMPLEX VITAMINS TABLET    Take 1 tablet by mouth daily.   BUDESONIDE-FORMOTEROL (SYMBICORT) 160-4.5 MCG/ACT INHALER    Inhale 2 puffs into the lungs 2 (two) times daily.   CHOLECALCIFEROL 1000 UNITS TABLET    Take 1,000 Units by mouth every  Wednesday.    CILOSTAZOL (PLETAL) 100 MG TABLET    TAKE 1 TABLET TWICE DAILY.   CYCLOBENZAPRINE (FLEXERIL) 5 MG TABLET    Take 5 mg by mouth 3 (three) times daily as needed for muscle spasms.    DOXYCYCLINE (VIBRA-TABS) 100 MG TABLET    Take 1 tablet (100 mg total) by mouth every 12 (twelve) hours.   GUAIFENESIN (MUCINEX) 600 MG 12 HR TABLET    Take 600 mg by mouth 2 (two) times daily as needed for cough or to loosen phlegm.   IBANDRONATE (BONIVA) 150 MG TABLET    Take 150 mg by mouth every 30 (thirty) days. Take in the morning with a full glass of water, on an empty stomach, and do not take anything else by mouth or lie down for the next 30 min.   IPRATROPIUM-ALBUTEROL (DUONEB) 0.5-2.5 (3) MG/3ML SOLN    Take 3 mLs by nebulization every 4 (four) hours as needed.   LOSARTAN (COZAAR) 100 MG TABLET    Take 1 tablet (100 mg total) by mouth every evening.   PREDNISONE (STERAPRED UNI-PAK 21 TAB) 10 MG (21) TBPK TABLET    Take 6-5-4-3-2-1 tablets by mouth daily till gone.   PROTEIN (PROCEL) POWD    Take 2 scoop by mouth 2 (two) times daily.   SIMVASTATIN (ZOCOR) 40 MG TABLET    Take 40 mg by mouth daily at 6 PM.  VITAMIN C (ASCORBIC ACID) 500 MG TABLET    Take 500 mg by mouth daily.  Modified Medications   No medications on file  Discontinued Medications   No medications on file    Allergies  Allergen Reactions  . Tylenol [Acetaminophen] Other (See Comments)    Pt states "It makes me shake"     REVIEW OF SYSTEMS:  GENERAL: no change in appetite, no fatigue, no weight changes, no fever, chills or weakness EYES: Denies change in vision, dry eyes, eye pain, itching or discharge EARS: Denies change in hearing, ringing in ears, or earache NOSE: Denies nasal congestion or epistaxis MOUTH and THROAT: Denies oral discomfort, gingival pain or bleeding, pain from teeth or hoarseness   RESPIRATORY: no cough, SOB, DOE, wheezing, hemoptysis CARDIAC: no chest pain, edema or palpitations GI: no  abdominal pain, diarrhea, constipation, heart burn, nausea or vomiting GU: Denies dysuria, frequency, hematuria, incontinence, or discharge PSYCHIATRIC: Denies feeling of depression or anxiety. No report of hallucinations, insomnia, paranoia, or agitation   PHYSICAL EXAMINATION  GENERAL APPEARANCE: Well nourished. In no acute distress. Normal body habitus HEAD: Normal in size and contour. No evidence of trauma EYES: Lids open and close normally. No blepharitis, entropion or ectropion. PERRL. Conjunctivae are clear and sclerae are white. Lenses are without opacity EARS: Pinnae are normal. Patient hears normal voice tunes of the examiner MOUTH and THROAT: Lips are without lesions. Oral mucosa is moist and without lesions. Tongue is normal in shape, size, and color and without lesions NECK: supple, trachea midline, no neck masses, no thyroid tenderness, no thyromegaly LYMPHATICS: no LAN in the neck, no supraclavicular LAN RESPIRATORY: breathing is even & unlabored, BS hasCARDIAC: RRR, no murmur,no extra heart sounds, no edema GI: abdomen soft, normal BS, no masses, no tenderness, no hepatomegaly, no splenomegaly EXTREMITIES:  Able to move X 4 extremities PSYCHIATRIC: Alert and oriented X 3. Affect and behavior are appropriate  LABS/RADIOLOGY: Labs reviewed: 08/21/15  WBC 10.8 hemoglobin 9.9 hematocrit 30.4 MCV 99.7 platelet 268 Basic Metabolic Panel:  Recent Labs  40/98/11 1459 08/13/15 0520 08/15/15 0508 08/18/15  NA 135 135 137 138  K 3.4* 3.6 3.8 3.9  CL 101 101 103  --   CO2 --   GLUCOSE 107* 168* 110*  --   BUN 6 15 26* 25*  CREATININE 0.69 0.93 0.95 0.9  CALCIUM 9.0 9.0 8.4*  --    CBC:  Recent Labs  05/25/15 2053 08/12/15 1459 08/13/15 0520 08/15/15 0508 08/18/15  WBC 9.2 8.0 7.8 10.2 8.2  NEUTROABS 5.8  --   --   --   --   HGB 12.2 11.2* 10.4* 9.9* 11.4*  HCT 36.8 34.1* 32.4* 31.2* 35*  MCV 98.1 98.8 99.7 99.4  --   PLT 262 228 237 236 298       ASSESSMENT/PLAN:  Physical deconditioning - for Home health PT, OT, CNA and social worker  COPD exacerbation - continue DuoNeb every 4 hours when necessary,  Advair and O2  CAP - given IV ceftriaxone and azithromycin then changed to doxycycline ; resolved  Hypertension - continue losartan to 100 mg by mouth daily and added Norvasc 10 mg by mouth daily  Anemia of chronic disease - hemoglobin 9.9; re-check hgb 9.9, stable  CAD - continue aspirin 81 mg by mouth daily  Bilateral PVD - continue Pletal 100 mg 1 tab by mouth twice a day, aspirin 81 mg by mouth daily and Flexeril 5 mg 1 tab  by mouth 3 times a day when necessary  GERD - recently started on Protonix 20 mg daily    I have filled out patient's discharge paperwork and written prescriptions.  Patient will receive home health PT, OT, SW and CNA.  Total discharge time: Less than 30 minutes  Discharge time involved coordination of the discharge process with Child psychotherapist, nursing staff and therapy department. Medical justification for home health services verified.    [facility], NP [employer] [phone]

## 2016-01-03 NOTE — Progress Notes (Signed)
This encounter was created in error - please disregard.

## 2016-01-18 DIAGNOSIS — I5032 Chronic diastolic (congestive) heart failure: Secondary | ICD-10-CM | POA: Diagnosis not present

## 2016-01-18 DIAGNOSIS — I10 Essential (primary) hypertension: Secondary | ICD-10-CM | POA: Diagnosis not present

## 2016-01-18 DIAGNOSIS — M810 Age-related osteoporosis without current pathological fracture: Secondary | ICD-10-CM | POA: Diagnosis not present

## 2016-01-18 DIAGNOSIS — E538 Deficiency of other specified B group vitamins: Secondary | ICD-10-CM | POA: Diagnosis not present

## 2016-01-18 DIAGNOSIS — J449 Chronic obstructive pulmonary disease, unspecified: Secondary | ICD-10-CM | POA: Diagnosis not present

## 2016-01-18 DIAGNOSIS — E785 Hyperlipidemia, unspecified: Secondary | ICD-10-CM | POA: Diagnosis not present

## 2016-01-21 DIAGNOSIS — I509 Heart failure, unspecified: Secondary | ICD-10-CM | POA: Diagnosis not present

## 2016-01-21 DIAGNOSIS — M19012 Primary osteoarthritis, left shoulder: Secondary | ICD-10-CM | POA: Diagnosis not present

## 2016-01-21 DIAGNOSIS — Z87891 Personal history of nicotine dependence: Secondary | ICD-10-CM | POA: Diagnosis not present

## 2016-01-21 DIAGNOSIS — I739 Peripheral vascular disease, unspecified: Secondary | ICD-10-CM | POA: Diagnosis not present

## 2016-01-21 DIAGNOSIS — I110 Hypertensive heart disease with heart failure: Secondary | ICD-10-CM | POA: Diagnosis not present

## 2016-01-21 DIAGNOSIS — J449 Chronic obstructive pulmonary disease, unspecified: Secondary | ICD-10-CM | POA: Diagnosis not present

## 2016-01-21 DIAGNOSIS — R2681 Unsteadiness on feet: Secondary | ICD-10-CM | POA: Diagnosis not present

## 2016-01-25 DIAGNOSIS — R2681 Unsteadiness on feet: Secondary | ICD-10-CM | POA: Diagnosis not present

## 2016-01-25 DIAGNOSIS — I110 Hypertensive heart disease with heart failure: Secondary | ICD-10-CM | POA: Diagnosis not present

## 2016-01-25 DIAGNOSIS — I509 Heart failure, unspecified: Secondary | ICD-10-CM | POA: Diagnosis not present

## 2016-01-25 DIAGNOSIS — M19012 Primary osteoarthritis, left shoulder: Secondary | ICD-10-CM | POA: Diagnosis not present

## 2016-01-25 DIAGNOSIS — J449 Chronic obstructive pulmonary disease, unspecified: Secondary | ICD-10-CM | POA: Diagnosis not present

## 2016-01-25 DIAGNOSIS — I739 Peripheral vascular disease, unspecified: Secondary | ICD-10-CM | POA: Diagnosis not present

## 2016-01-27 DIAGNOSIS — M19012 Primary osteoarthritis, left shoulder: Secondary | ICD-10-CM | POA: Diagnosis not present

## 2016-01-27 DIAGNOSIS — J449 Chronic obstructive pulmonary disease, unspecified: Secondary | ICD-10-CM | POA: Diagnosis not present

## 2016-01-27 DIAGNOSIS — R2681 Unsteadiness on feet: Secondary | ICD-10-CM | POA: Diagnosis not present

## 2016-01-27 DIAGNOSIS — I110 Hypertensive heart disease with heart failure: Secondary | ICD-10-CM | POA: Diagnosis not present

## 2016-02-01 DIAGNOSIS — I739 Peripheral vascular disease, unspecified: Secondary | ICD-10-CM | POA: Diagnosis not present

## 2016-02-01 DIAGNOSIS — J449 Chronic obstructive pulmonary disease, unspecified: Secondary | ICD-10-CM | POA: Diagnosis not present

## 2016-02-01 DIAGNOSIS — R2681 Unsteadiness on feet: Secondary | ICD-10-CM | POA: Diagnosis not present

## 2016-02-01 DIAGNOSIS — I509 Heart failure, unspecified: Secondary | ICD-10-CM | POA: Diagnosis not present

## 2016-02-01 DIAGNOSIS — I110 Hypertensive heart disease with heart failure: Secondary | ICD-10-CM | POA: Diagnosis not present

## 2016-02-01 DIAGNOSIS — M19012 Primary osteoarthritis, left shoulder: Secondary | ICD-10-CM | POA: Diagnosis not present

## 2016-02-04 DIAGNOSIS — I739 Peripheral vascular disease, unspecified: Secondary | ICD-10-CM | POA: Diagnosis not present

## 2016-02-04 DIAGNOSIS — M19012 Primary osteoarthritis, left shoulder: Secondary | ICD-10-CM | POA: Diagnosis not present

## 2016-02-04 DIAGNOSIS — J449 Chronic obstructive pulmonary disease, unspecified: Secondary | ICD-10-CM | POA: Diagnosis not present

## 2016-02-04 DIAGNOSIS — I110 Hypertensive heart disease with heart failure: Secondary | ICD-10-CM | POA: Diagnosis not present

## 2016-02-04 DIAGNOSIS — R2681 Unsteadiness on feet: Secondary | ICD-10-CM | POA: Diagnosis not present

## 2016-02-04 DIAGNOSIS — I509 Heart failure, unspecified: Secondary | ICD-10-CM | POA: Diagnosis not present

## 2016-02-08 DIAGNOSIS — M19012 Primary osteoarthritis, left shoulder: Secondary | ICD-10-CM | POA: Diagnosis not present

## 2016-02-08 DIAGNOSIS — J449 Chronic obstructive pulmonary disease, unspecified: Secondary | ICD-10-CM | POA: Diagnosis not present

## 2016-02-08 DIAGNOSIS — I509 Heart failure, unspecified: Secondary | ICD-10-CM | POA: Diagnosis not present

## 2016-02-08 DIAGNOSIS — I739 Peripheral vascular disease, unspecified: Secondary | ICD-10-CM | POA: Diagnosis not present

## 2016-02-08 DIAGNOSIS — I110 Hypertensive heart disease with heart failure: Secondary | ICD-10-CM | POA: Diagnosis not present

## 2016-02-08 DIAGNOSIS — R2681 Unsteadiness on feet: Secondary | ICD-10-CM | POA: Diagnosis not present

## 2016-02-11 DIAGNOSIS — I509 Heart failure, unspecified: Secondary | ICD-10-CM | POA: Diagnosis not present

## 2016-02-11 DIAGNOSIS — M19012 Primary osteoarthritis, left shoulder: Secondary | ICD-10-CM | POA: Diagnosis not present

## 2016-02-11 DIAGNOSIS — I739 Peripheral vascular disease, unspecified: Secondary | ICD-10-CM | POA: Diagnosis not present

## 2016-02-11 DIAGNOSIS — R2681 Unsteadiness on feet: Secondary | ICD-10-CM | POA: Diagnosis not present

## 2016-02-11 DIAGNOSIS — J449 Chronic obstructive pulmonary disease, unspecified: Secondary | ICD-10-CM | POA: Diagnosis not present

## 2016-02-11 DIAGNOSIS — I110 Hypertensive heart disease with heart failure: Secondary | ICD-10-CM | POA: Diagnosis not present

## 2016-02-15 DIAGNOSIS — M19012 Primary osteoarthritis, left shoulder: Secondary | ICD-10-CM | POA: Diagnosis not present

## 2016-02-15 DIAGNOSIS — R2681 Unsteadiness on feet: Secondary | ICD-10-CM | POA: Diagnosis not present

## 2016-02-15 DIAGNOSIS — I739 Peripheral vascular disease, unspecified: Secondary | ICD-10-CM | POA: Diagnosis not present

## 2016-02-15 DIAGNOSIS — I509 Heart failure, unspecified: Secondary | ICD-10-CM | POA: Diagnosis not present

## 2016-02-15 DIAGNOSIS — I110 Hypertensive heart disease with heart failure: Secondary | ICD-10-CM | POA: Diagnosis not present

## 2016-02-15 DIAGNOSIS — J449 Chronic obstructive pulmonary disease, unspecified: Secondary | ICD-10-CM | POA: Diagnosis not present

## 2016-02-16 DIAGNOSIS — I509 Heart failure, unspecified: Secondary | ICD-10-CM | POA: Diagnosis not present

## 2016-02-16 DIAGNOSIS — M19012 Primary osteoarthritis, left shoulder: Secondary | ICD-10-CM | POA: Diagnosis not present

## 2016-02-16 DIAGNOSIS — I739 Peripheral vascular disease, unspecified: Secondary | ICD-10-CM | POA: Diagnosis not present

## 2016-02-16 DIAGNOSIS — R2681 Unsteadiness on feet: Secondary | ICD-10-CM | POA: Diagnosis not present

## 2016-02-16 DIAGNOSIS — J449 Chronic obstructive pulmonary disease, unspecified: Secondary | ICD-10-CM | POA: Diagnosis not present

## 2016-02-16 DIAGNOSIS — I110 Hypertensive heart disease with heart failure: Secondary | ICD-10-CM | POA: Diagnosis not present

## 2016-06-06 DIAGNOSIS — M1712 Unilateral primary osteoarthritis, left knee: Secondary | ICD-10-CM | POA: Diagnosis not present

## 2016-06-06 DIAGNOSIS — M19012 Primary osteoarthritis, left shoulder: Secondary | ICD-10-CM | POA: Diagnosis not present

## 2016-06-06 DIAGNOSIS — M1711 Unilateral primary osteoarthritis, right knee: Secondary | ICD-10-CM | POA: Diagnosis not present

## 2016-06-17 ENCOUNTER — Other Ambulatory Visit: Payer: Self-pay

## 2016-07-25 DIAGNOSIS — I5032 Chronic diastolic (congestive) heart failure: Secondary | ICD-10-CM | POA: Diagnosis not present

## 2016-07-25 DIAGNOSIS — E785 Hyperlipidemia, unspecified: Secondary | ICD-10-CM | POA: Diagnosis not present

## 2016-07-25 DIAGNOSIS — M810 Age-related osteoporosis without current pathological fracture: Secondary | ICD-10-CM | POA: Diagnosis not present

## 2016-07-25 DIAGNOSIS — F17211 Nicotine dependence, cigarettes, in remission: Secondary | ICD-10-CM | POA: Diagnosis not present

## 2016-07-25 DIAGNOSIS — E538 Deficiency of other specified B group vitamins: Secondary | ICD-10-CM | POA: Diagnosis not present

## 2016-07-25 DIAGNOSIS — M79651 Pain in right thigh: Secondary | ICD-10-CM | POA: Diagnosis not present

## 2016-07-25 DIAGNOSIS — J449 Chronic obstructive pulmonary disease, unspecified: Secondary | ICD-10-CM | POA: Diagnosis not present

## 2016-07-25 DIAGNOSIS — I10 Essential (primary) hypertension: Secondary | ICD-10-CM | POA: Diagnosis not present

## 2016-07-25 DIAGNOSIS — I129 Hypertensive chronic kidney disease with stage 1 through stage 4 chronic kidney disease, or unspecified chronic kidney disease: Secondary | ICD-10-CM | POA: Diagnosis not present

## 2016-07-25 DIAGNOSIS — I739 Peripheral vascular disease, unspecified: Secondary | ICD-10-CM | POA: Diagnosis not present

## 2016-08-22 DIAGNOSIS — M19012 Primary osteoarthritis, left shoulder: Secondary | ICD-10-CM | POA: Diagnosis not present

## 2016-08-22 DIAGNOSIS — N183 Chronic kidney disease, stage 3 (moderate): Secondary | ICD-10-CM | POA: Diagnosis not present

## 2016-08-26 ENCOUNTER — Encounter (HOSPITAL_COMMUNITY): Payer: Self-pay | Admitting: *Deleted

## 2016-08-26 ENCOUNTER — Emergency Department (HOSPITAL_COMMUNITY): Payer: Medicare Other

## 2016-08-26 ENCOUNTER — Emergency Department (HOSPITAL_COMMUNITY)
Admission: EM | Admit: 2016-08-26 | Discharge: 2016-08-26 | Disposition: A | Discharge status: Home / Self Care | Payer: Medicare Other | Attending: Emergency Medicine | Admitting: Emergency Medicine

## 2016-08-26 DIAGNOSIS — G8929 Other chronic pain: Secondary | ICD-10-CM | POA: Diagnosis not present

## 2016-08-26 DIAGNOSIS — M7989 Other specified soft tissue disorders: Secondary | ICD-10-CM | POA: Diagnosis not present

## 2016-08-26 DIAGNOSIS — I10 Essential (primary) hypertension: Secondary | ICD-10-CM | POA: Insufficient documentation

## 2016-08-26 DIAGNOSIS — Z79899 Other long term (current) drug therapy: Secondary | ICD-10-CM | POA: Diagnosis not present

## 2016-08-26 DIAGNOSIS — R030 Elevated blood-pressure reading, without diagnosis of hypertension: Secondary | ICD-10-CM | POA: Diagnosis not present

## 2016-08-26 DIAGNOSIS — Z7982 Long term (current) use of aspirin: Secondary | ICD-10-CM | POA: Diagnosis not present

## 2016-08-26 DIAGNOSIS — J449 Chronic obstructive pulmonary disease, unspecified: Secondary | ICD-10-CM | POA: Diagnosis not present

## 2016-08-26 DIAGNOSIS — R531 Weakness: Secondary | ICD-10-CM | POA: Diagnosis not present

## 2016-08-26 DIAGNOSIS — M25569 Pain in unspecified knee: Secondary | ICD-10-CM | POA: Diagnosis not present

## 2016-08-26 DIAGNOSIS — M25562 Pain in left knee: Secondary | ICD-10-CM | POA: Insufficient documentation

## 2016-08-26 DIAGNOSIS — Z87891 Personal history of nicotine dependence: Secondary | ICD-10-CM | POA: Insufficient documentation

## 2016-08-26 MED ORDER — TRAMADOL HCL 50 MG PO TABS: 50.0000 mg | ORAL_TABLET | Freq: Four times a day (QID) | ORAL | 0 refills | Status: DC | PRN

## 2016-08-26 MED ORDER — AMLODIPINE BESYLATE 5 MG PO TABS
10.0000 mg | ORAL_TABLET | Freq: Once | ORAL | Status: AC
  Administered 2016-08-26: 10 mg via ORAL
  Filled 2016-08-26: qty 2

## 2016-08-26 NOTE — ED Notes (Signed)
Patient given water

## 2016-08-26 NOTE — ED Notes (Signed)
Bed: WHALA Expected date:  Expected time:  Means of arrival:  Comments: No bed. 

## 2016-08-26 NOTE — ED Triage Notes (Signed)
Per EMS pt form home with c/o left knee pain, per EMS daughter reports pt recently stopped taking meloxicam, per EMS pt unable to bear weight on left leg, slight knee swelling noted. Pt hypertensive with b/p 190/86. Hx of same

## 2016-08-26 NOTE — ED Notes (Signed)
PTAR requested for patient transport 

## 2016-08-26 NOTE — ED Notes (Signed)
Provider notified about BP, will visit pt and advise RN for d/c instructions

## 2016-08-26 NOTE — ED Notes (Signed)
Bed: WA08 Expected date: 08/26/16 Expected time: 10:18 AM Means of arrival: Ambulance Comments: Leg pain

## 2016-08-26 NOTE — Discharge Instructions (Signed)
Please take tramadol every 6-8 hours AS NEEDED for pain Follow up with primary care doctor on Monday, as scheduled, to discuss other pain control regimen Return to ED if you develop leg weakness, numbness, bladder retention or incontinence

## 2016-08-26 NOTE — ED Provider Notes (Signed)
[facility]-EMERGENCY DEPT Provider Note   CSN: [id number] Arrival date & time: 08/26/16  1014     History   Chief Complaint Chief Complaint  Patient presents with  . Knee Pain    HPI Carrie Pena is a 82 y.o. female with pmh as noted below presents with L knee sharp shooting pain that radiated to anterior/lateral L thigh sharp since yesterday.  Pt states she has chronic knee pain that she treats with meloxicam and ibuprofen.  However, her doctor recently told her to decrease her use of meloxicam/ibuprofen due to deteriorating kidney function and to take these pain medications prn and not scheduled.  Pt has decreased taking these medications over the last week.  Last dose of ibuprofen this AM for worsening pain.  Pt has history of bilateral PVD (on cilostazol, flexeril for symptoms) and osteoarthritis.  She has gotten multiple steroids shots in bilateral knees, unable to recall when the last shot was.  Pt denies recent fevers, trauma to L extremity, falls, swelling or redness of joints.  Pt has history of COPD with frequent nebs during the day, she denies sudden onset of chest pain, shortness of breath or increased cough/sputum production.    Pt is accompanied by caretaker who takes care of her twice a week.  Caretaker informs me pt has a history of dementia and slight confusion, she requests that I call the pt's daughter, Carrie KaufmannMelissa.   HPI  Past Medical History:  Diagnosis Date  . Carpal tunnel syndrome   . COPD (chronic obstructive pulmonary disease) (HCC)   . GERD (gastroesophageal reflux disease)   . Hyperlipidemia   . Hypertension   . Osteoarthritis     Patient Active Problem List   Diagnosis Date Noted  . Essential hypertension, benign 08/22/2015  . COPD exacerbation (HCC) 08/12/2015  . Respiratory failure (HCC) 08/12/2015  . CAP (community acquired pneumonia) 08/12/2015  . Pain of right lower extremity 05/06/2014  . Swelling of limb 05/06/2014  . Atherosclerosis of native  arteries of the extremities with intermittent claudication 07/18/2011    Past Surgical History:  Procedure Laterality Date  . KNEE ARTHROSCOPY    . left carpal tunnel    . TONSILLECTOMY      OB History    No data available       Home Medications    Prior to Admission medications   Medication Sig Start Date End Date Taking? Authorizing Provider  aspirin 81 MG tablet Take 81 mg by mouth daily.     Yes Historical Provider, MD  b complex vitamins tablet Take 1 tablet by mouth daily.   Yes Historical Provider, MD  Cholecalciferol 1000 UNITS tablet Take 1,000 Units by mouth every Wednesday.    Yes Historical Provider, MD  cilostazol (PLETAL) 100 MG tablet TAKE 1 TABLET TWICE DAILY. 01/14/13  Yes Fransisco HertzBrian L Chen, MD  cyclobenzaprine (FLEXERIL) 5 MG tablet Take 5 mg by mouth 3 (three) times daily as needed for muscle spasms.    Yes Historical Provider, MD  guaiFENesin (MUCINEX) 600 MG 12 hr tablet Take 600 mg by mouth 2 (two) times daily as needed for cough or to loosen phlegm.   Yes Historical Provider, MD  ibandronate (BONIVA) 150 MG tablet Take 150 mg by mouth every 30 (thirty) days. Take in the morning with a full glass of water, on an empty stomach, and do not take anything else by mouth or lie down for the next 30 min.   Yes Historical Provider, MD  ibuprofen (  ADVIL,MOTRIN) 200 MG tablet Take 400 mg by mouth every 6 (six) hours as needed (leg pain).   Yes Historical Provider, MD  ipratropium-albuterol (DUONEB) 0.5-2.5 (3) MG/3ML SOLN Take 3 mLs by nebulization every 4 (four) hours as needed. 08/15/15  Yes Jeralyn Bennett, MD  losartan (COZAAR) 100 MG tablet Take 1 tablet (100 mg total) by mouth every evening. 08/15/15  Yes Jeralyn Bennett, MD  meclizine (ANTIVERT) 25 MG tablet Take 12.5 mg by mouth 3 (three) times daily as needed for dizziness.   Yes Historical Provider, MD  meloxicam (MOBIC) 15 MG tablet Take 15 mg by mouth daily as needed for pain. In the morning 06/07/16  Yes Historical  Provider, MD  Protein (PROCEL) POWD Take 2 scoop by mouth 2 (two) times daily.   Yes Historical Provider, MD  simvastatin (ZOCOR) 40 MG tablet Take 40 mg by mouth daily at 6 PM.   Yes Historical Provider, MD  vitamin C (ASCORBIC ACID) 500 MG tablet Take 500 mg by mouth daily.   Yes Historical Provider, MD  amLODipine (NORVASC) 10 MG tablet Take 1 tablet (10 mg total) by mouth daily. Patient not taking: Reported on 08/26/2016 08/15/15   Jeralyn Bennett, MD  doxycycline (VIBRA-TABS) 100 MG tablet Take 1 tablet (100 mg total) by mouth every 12 (twelve) hours. Patient not taking: Reported on 08/26/2016 08/15/15   Jeralyn Bennett, MD  predniSONE (STERAPRED UNI-PAK 21 TAB) 10 MG (21) TBPK tablet Take 6-5-4-3-2-1 tablets by mouth daily till gone. Patient not taking: Reported on 08/26/2016 08/15/15   Jeralyn Bennett, MD    Family History Family History  Problem Relation Age of Onset  . Cancer Mother   . Cancer Father   . Hypertension Daughter   . Heart disease Daughter     before age 23    Social History Social History  Substance Use Topics  . Smoking status: Former Smoker    Packs/day: 0.50    Types: Cigarettes    Quit date: 03/12/2015  . Smokeless tobacco: Never Used  . Alcohol use No     Allergies   Tylenol [acetaminophen]   Review of Systems Review of Systems  Constitutional: Negative for chills and fever.  HENT: Negative for congestion and sore throat.   Eyes: Negative for visual disturbance.  Respiratory: Negative for chest tightness and shortness of breath.   Cardiovascular: Negative for chest pain.  Gastrointestinal: Negative for abdominal pain, constipation, diarrhea, nausea and vomiting.  Genitourinary: Negative for decreased urine volume, dysuria, enuresis, pelvic pain and urgency.     Physical Exam Updated Vital Signs BP 168/87   Pulse 84   Temp 97.6 F (36.4 C) (Oral)   Resp 18   SpO2 96%   Physical Exam  Constitutional: She is oriented to person,  place, and time. She appears well-developed and well-nourished. No distress.  HENT:  Head: Normocephalic and atraumatic.  Mouth/Throat: No oropharyngeal exudate.  Eyes: EOM are normal. Pupils are equal, round, and reactive to light.  Neck: Neck supple.  Cardiovascular: Normal rate, regular rhythm, normal heart sounds and intact distal pulses.   DP, PT and popliteal pulses strong bialaterally  Pulmonary/Chest: Effort normal and breath sounds normal.  Abdominal: Soft. There is no tenderness.  Musculoskeletal: Normal range of motion. She exhibits no edema or deformity.  No deformities, edema, erythema, warmth or evidence of trauma in lower extremities or joints of lower extremities.  Calves are supple and nontender.  Full active and passive ROM of the L and R hips, knees,  ankle and toes.  No crepitus noted with ROM of L knee.  Muscle strength is 5/5 and equal in lower extremities.  Light touch sensation intact in bilateral lower extremities.  Pt reported pain with L hip flexion against resistance although strength and ROM of L hip remained intact through pain.  No tenderness on palpation of bony landmarks of L knee and L hip.   Lymphadenopathy:    She has no cervical adenopathy.  Neurological: She is alert and oriented to person, place, and time.  Skin: Skin is warm and dry. No erythema.  Psychiatric: She has a normal mood and affect. Her behavior is normal.     ED Treatments / Results  Labs (all labs ordered are listed, but only abnormal results are displayed) Labs Reviewed - No data to display  EKG  EKG Interpretation None       Radiology Dg Knee Complete 4 Views Left  Result Date: 08/26/2016 CLINICAL DATA:  Lateral left knee pain and swelling for 2 days EXAM: LEFT KNEE - COMPLETE 4+ VIEW COMPARISON:  None. FINDINGS: No acute fracture. No dislocation. Unremarkable soft tissues. Osteopenia. A moderate narrowing and juxta-articular sclerosis of the lateral compartment. IMPRESSION: No  acute bony pathology.  Chronic changes. Electronically Signed   By: Jolaine ClickArthur  Hoss M.D.   On: 08/26/2016 14:54    Procedures Procedures (including critical care time)  Medications Ordered in ED Medications - No data to display   Initial Impression / Assessment and Plan / ED Course  I have reviewed the triage vital signs and the nursing notes.  Pertinent labs & imaging results that were available during my care of the patient were reviewed by me and considered in my medical decision making (see chart for details).  Clinical Course as of Aug 27 1607  Caleen EssexFri Aug 26, 2016  1344 Called pt's daughter and granddaughter to explain pt presentation and work up.  Pt's daughter and granddaughter state pt has stopped taking all her medications x 1 week and has only been taking vitamin D and ibuprofen as needed.  They are agreeable to x-ray to rule out fx.   [CG]    Clinical Course User Index [CG] Liberty Handylaudia J Jacquette Canales, PA-C   82 yo female with pmh of HTN, GERD, HLD, OA, COPD and bilateral PVD presents with L knee pain.  Pt lives at alone in her home, she only gets help from a caretaker twice a week for a total of 8 hours a week.  Caretaker, and pt's daughter and granddaugther, who I talked to over the phone per caretaker's request, all  confirmed that pt stopped taking all her medications x 1 week, except for vitamin D, bc pt thinks her doctor told her all her medications are prn.  On exam pt is afebrile under no respiratory distress, she is found laying in her bed reading a book.  Pt's L knee and hip are without erythema, edema and no bony tenderness.  ROM and strength is equal bilaterally in both lower extremities.  Pt's light touch is intact in both lower extremities.  Pt denies groin numbness, bladder retention or incontinence.  Pt endorses L knee/thigh pain with knee extension only.  She is not tender at L hip.  Initial ddx for L knee and anterior thigh pain included fracture, osteoarthritis, bone malignancy,  septic arthritis.  On exam there is no fever and no signs of joint effusion, erythema or edema.  L knee X-ray shows no acute fracture or dislocation, and only  show age related cartilage and sclerosis.  I think that L knee pain is likely due to her knee OA that has been untreated for the last week.  Pt has prescribed meloxicam and ibuprofen for joint pain, which she has not been taking the last week.  Pt is likely experiencing break through knee OA pain since she stopped taking her pain medications.  I spent a good amount of time explaining to pt the importance of adherence to medical regiment.  Pt states she stopped taking her pain medications bc her "doctor told me it was bad for my kidneys".  Pt, caretaker and pt's daughter were instructed to follow up with her primary care doctor (she has an appointment on Monday 11/13) to determine a good long term pain management regimen.  Pt and pt's daughter state pt is allergic to tylenol.  Given her age and worsening kidney insufficiency (creatinine elevation per chart), I think it is best for pt to take tramadol 50 mg q6-8hr or prn over the weekend until she is able to see her pcp on the 11/13.  Will instruct patient to avoid ibuprofen until she sees her pcp.  Final Clinical Impressions(s) / ED Diagnoses   Final diagnoses:  None    New Prescriptions New Prescriptions   No medications on file     Liberty Handy, PA-C 08/26/16 1608    Liberty Handy, PA-C 08/26/16 1615    Rolland Porter, MD 09/14/16 1021

## 2016-09-01 ENCOUNTER — Encounter (HOSPITAL_COMMUNITY): Payer: Self-pay | Admitting: *Deleted

## 2016-09-01 ENCOUNTER — Emergency Department (HOSPITAL_COMMUNITY): Payer: Medicare Other

## 2016-09-01 ENCOUNTER — Inpatient Hospital Stay (HOSPITAL_COMMUNITY)
Admission: EM | Admit: 2016-09-01 | Discharge: 2016-09-06 | DRG: 604 | Disposition: A | Discharge status: Skilled Nursing Facility | Payer: Medicare Other | Attending: Internal Medicine | Admitting: Internal Medicine

## 2016-09-01 DIAGNOSIS — I10 Essential (primary) hypertension: Secondary | ICD-10-CM

## 2016-09-01 DIAGNOSIS — S301XXA Contusion of abdominal wall, initial encounter: Secondary | ICD-10-CM | POA: Diagnosis not present

## 2016-09-01 DIAGNOSIS — I5032 Chronic diastolic (congestive) heart failure: Secondary | ICD-10-CM

## 2016-09-01 DIAGNOSIS — D539 Nutritional anemia, unspecified: Secondary | ICD-10-CM | POA: Diagnosis present

## 2016-09-01 DIAGNOSIS — N179 Acute kidney failure, unspecified: Secondary | ICD-10-CM | POA: Diagnosis present

## 2016-09-01 DIAGNOSIS — Y92012 Bathroom of single-family (private) house as the place of occurrence of the external cause: Secondary | ICD-10-CM

## 2016-09-01 DIAGNOSIS — Z66 Do not resuscitate: Secondary | ICD-10-CM | POA: Diagnosis present

## 2016-09-01 DIAGNOSIS — K5900 Constipation, unspecified: Secondary | ICD-10-CM | POA: Diagnosis present

## 2016-09-01 DIAGNOSIS — R05 Cough: Secondary | ICD-10-CM | POA: Diagnosis not present

## 2016-09-01 DIAGNOSIS — Z7982 Long term (current) use of aspirin: Secondary | ICD-10-CM

## 2016-09-01 DIAGNOSIS — Z8249 Family history of ischemic heart disease and other diseases of the circulatory system: Secondary | ICD-10-CM

## 2016-09-01 DIAGNOSIS — S7012XA Contusion of left thigh, initial encounter: Secondary | ICD-10-CM | POA: Diagnosis not present

## 2016-09-01 DIAGNOSIS — R52 Pain, unspecified: Secondary | ICD-10-CM | POA: Diagnosis present

## 2016-09-01 DIAGNOSIS — Z87891 Personal history of nicotine dependence: Secondary | ICD-10-CM

## 2016-09-01 DIAGNOSIS — R262 Difficulty in walking, not elsewhere classified: Secondary | ICD-10-CM

## 2016-09-01 DIAGNOSIS — D62 Acute posthemorrhagic anemia: Secondary | ICD-10-CM | POA: Diagnosis present

## 2016-09-01 DIAGNOSIS — M25552 Pain in left hip: Secondary | ICD-10-CM | POA: Diagnosis not present

## 2016-09-01 DIAGNOSIS — R2689 Other abnormalities of gait and mobility: Secondary | ICD-10-CM | POA: Diagnosis not present

## 2016-09-01 DIAGNOSIS — M79605 Pain in left leg: Secondary | ICD-10-CM | POA: Diagnosis not present

## 2016-09-01 DIAGNOSIS — M79652 Pain in left thigh: Secondary | ICD-10-CM | POA: Diagnosis not present

## 2016-09-01 DIAGNOSIS — I5033 Acute on chronic diastolic (congestive) heart failure: Secondary | ICD-10-CM | POA: Diagnosis not present

## 2016-09-01 DIAGNOSIS — J9600 Acute respiratory failure, unspecified whether with hypoxia or hypercapnia: Secondary | ICD-10-CM | POA: Diagnosis present

## 2016-09-01 DIAGNOSIS — M810 Age-related osteoporosis without current pathological fracture: Secondary | ICD-10-CM | POA: Diagnosis present

## 2016-09-01 DIAGNOSIS — I110 Hypertensive heart disease with heart failure: Secondary | ICD-10-CM | POA: Diagnosis present

## 2016-09-01 DIAGNOSIS — S90812A Abrasion, left foot, initial encounter: Secondary | ICD-10-CM | POA: Diagnosis not present

## 2016-09-01 DIAGNOSIS — R0602 Shortness of breath: Secondary | ICD-10-CM | POA: Diagnosis not present

## 2016-09-01 DIAGNOSIS — T501X5A Adverse effect of loop [high-ceiling] diuretics, initial encounter: Secondary | ICD-10-CM | POA: Diagnosis present

## 2016-09-01 DIAGNOSIS — J441 Chronic obstructive pulmonary disease with (acute) exacerbation: Secondary | ICD-10-CM | POA: Diagnosis present

## 2016-09-01 DIAGNOSIS — W010XXA Fall on same level from slipping, tripping and stumbling without subsequent striking against object, initial encounter: Secondary | ICD-10-CM | POA: Diagnosis present

## 2016-09-01 DIAGNOSIS — E785 Hyperlipidemia, unspecified: Secondary | ICD-10-CM | POA: Diagnosis present

## 2016-09-01 DIAGNOSIS — S79912A Unspecified injury of left hip, initial encounter: Secondary | ICD-10-CM | POA: Diagnosis not present

## 2016-09-01 DIAGNOSIS — Z809 Family history of malignant neoplasm, unspecified: Secondary | ICD-10-CM

## 2016-09-01 DIAGNOSIS — K219 Gastro-esophageal reflux disease without esophagitis: Secondary | ICD-10-CM | POA: Diagnosis present

## 2016-09-01 DIAGNOSIS — I739 Peripheral vascular disease, unspecified: Secondary | ICD-10-CM | POA: Diagnosis present

## 2016-09-01 LAB — URINE MICROSCOPIC-ADD ON

## 2016-09-01 LAB — CBC WITH DIFFERENTIAL/PLATELET
Basophils Absolute: 0 10*3/uL (ref 0.0–0.1)
Basophils Relative: 0 %
Eosinophils Absolute: 0.1 10*3/uL (ref 0.0–0.7)
Eosinophils Relative: 1 %
HCT: 30.7 % — ABNORMAL LOW (ref 36.0–46.0)
Hemoglobin: 9.8 g/dL — ABNORMAL LOW (ref 12.0–15.0)
Lymphocytes Relative: 21 %
Lymphs Abs: 1.4 10*3/uL (ref 0.7–4.0)
MCH: 33.2 pg (ref 26.0–34.0)
MCHC: 31.9 g/dL (ref 30.0–36.0)
MCV: 104.1 fL — ABNORMAL HIGH (ref 78.0–100.0)
Monocytes Absolute: 0.8 10*3/uL (ref 0.1–1.0)
Monocytes Relative: 13 %
Neutro Abs: 4.1 10*3/uL (ref 1.7–7.7)
Neutrophils Relative %: 65 %
Platelets: 309 10*3/uL (ref 150–400)
RBC: 2.95 MIL/uL — ABNORMAL LOW (ref 3.87–5.11)
RDW: 15 % (ref 11.5–15.5)
WBC: 6.4 10*3/uL (ref 4.0–10.5)

## 2016-09-01 LAB — RETICULOCYTES
RBC.: 2.85 MIL/uL — ABNORMAL LOW (ref 3.87–5.11)
Retic Count, Absolute: 82.7 10*3/uL (ref 19.0–186.0)
Retic Ct Pct: 2.9 % (ref 0.4–3.1)

## 2016-09-01 LAB — URINALYSIS, ROUTINE W REFLEX MICROSCOPIC
Glucose, UA: NEGATIVE mg/dL
Hgb urine dipstick: NEGATIVE
Ketones, ur: NEGATIVE mg/dL
Leukocytes, UA: NEGATIVE
Nitrite: NEGATIVE
Protein, ur: 30 mg/dL — AB
Specific Gravity, Urine: 1.024 (ref 1.005–1.030)
pH: 6 (ref 5.0–8.0)

## 2016-09-01 LAB — I-STAT TROPONIN, ED: TROPONIN I, POC: 0 ng/mL (ref 0.00–0.08)

## 2016-09-01 LAB — BASIC METABOLIC PANEL
Anion gap: 7 (ref 5–15)
BUN: 18 mg/dL (ref 6–20)
CO2: 26 mmol/L (ref 22–32)
Calcium: 8.5 mg/dL — ABNORMAL LOW (ref 8.9–10.3)
Chloride: 104 mmol/L (ref 101–111)
Creatinine, Ser: 0.96 mg/dL (ref 0.44–1.00)
GFR calc Af Amer: >60 mL/min (ref >60)
GFR calc non Af Amer: 52 mL/min — ABNORMAL LOW (ref >60)
Glucose, Bld: 94 mg/dL (ref 65–99)
Potassium: 3.8 mmol/L (ref 3.5–5.1)
Sodium: 137 mmol/L (ref 135–145)

## 2016-09-01 LAB — CK: CK TOTAL: 139 U/L (ref 38–234)

## 2016-09-01 MED ORDER — IPRATROPIUM-ALBUTEROL 0.5-2.5 (3) MG/3ML IN SOLN
3.0000 mL | RESPIRATORY_TRACT | Status: DC | PRN
  Administered 2016-09-02 – 2016-09-05 (×2): 3 mL via RESPIRATORY_TRACT
  Filled 2016-09-01 (×2): qty 3

## 2016-09-01 MED ORDER — DEXTROSE 5 % IV SOLN
500.0000 mg | INTRAVENOUS | Status: DC
  Administered 2016-09-01 – 2016-09-02 (×2): 500 mg via INTRAVENOUS
  Filled 2016-09-01 (×2): qty 500

## 2016-09-01 MED ORDER — PREDNISONE 20 MG PO TABS
40.0000 mg | ORAL_TABLET | Freq: Every day | ORAL | Status: DC
  Administered 2016-09-02: 40 mg via ORAL
  Filled 2016-09-01: qty 2

## 2016-09-01 MED ORDER — SIMVASTATIN 40 MG PO TABS
40.0000 mg | ORAL_TABLET | Freq: Every day | ORAL | Status: DC
  Administered 2016-09-02 – 2016-09-05 (×4): 40 mg via ORAL
  Filled 2016-09-01: qty 2
  Filled 2016-09-01: qty 1
  Filled 2016-09-01: qty 2
  Filled 2016-09-01 (×4): qty 1
  Filled 2016-09-01 (×2): qty 2

## 2016-09-01 MED ORDER — ASPIRIN 81 MG PO CHEW
81.0000 mg | CHEWABLE_TABLET | Freq: Every day | ORAL | Status: DC
  Administered 2016-09-02 – 2016-09-06 (×5): 81 mg via ORAL
  Filled 2016-09-01 (×5): qty 1

## 2016-09-01 MED ORDER — OXYCODONE HCL 5 MG PO TABS
5.0000 mg | ORAL_TABLET | ORAL | Status: DC | PRN
  Administered 2016-09-01: 5 mg via ORAL
  Filled 2016-09-01: qty 1

## 2016-09-01 MED ORDER — IBANDRONATE SODIUM 150 MG PO TABS: 150.0000 mg | ORAL_TABLET | ORAL | Status: DC

## 2016-09-01 MED ORDER — ENOXAPARIN SODIUM 40 MG/0.4ML ~~LOC~~ SOLN
40.0000 mg | Freq: Every day | SUBCUTANEOUS | Status: DC
  Administered 2016-09-02 – 2016-09-04 (×3): 40 mg via SUBCUTANEOUS
  Filled 2016-09-01 (×3): qty 0.4

## 2016-09-01 MED ORDER — IBUPROFEN 200 MG PO TABS: 400.0000 mg | ORAL_TABLET | Freq: Four times a day (QID) | ORAL | Status: DC | PRN

## 2016-09-01 MED ORDER — CYCLOBENZAPRINE HCL 5 MG PO TABS
5.0000 mg | ORAL_TABLET | Freq: Three times a day (TID) | ORAL | Status: DC | PRN
  Administered 2016-09-02 – 2016-09-05 (×5): 5 mg via ORAL
  Filled 2016-09-01 (×5): qty 1

## 2016-09-01 MED ORDER — DIAZEPAM 2 MG PO TABS
2.0000 mg | ORAL_TABLET | Freq: Once | ORAL | Status: AC
  Administered 2016-09-01: 2 mg via ORAL
  Filled 2016-09-01: qty 1

## 2016-09-01 MED ORDER — LOSARTAN POTASSIUM 50 MG PO TABS
50.0000 mg | ORAL_TABLET | Freq: Every day | ORAL | Status: DC
  Administered 2016-09-02 – 2016-09-04 (×3): 50 mg via ORAL
  Filled 2016-09-01 (×3): qty 1

## 2016-09-01 MED ORDER — IBUPROFEN 200 MG PO TABS
400.0000 mg | ORAL_TABLET | Freq: Once | ORAL | Status: AC
  Administered 2016-09-01: 400 mg via ORAL
  Filled 2016-09-01: qty 2

## 2016-09-01 MED ORDER — HYDROMORPHONE HCL 1 MG/ML IJ SOLN
1.0000 mg | INTRAMUSCULAR | Status: DC | PRN
  Administered 2016-09-02: 1 mg via INTRAVENOUS
  Filled 2016-09-01: qty 1

## 2016-09-01 MED ORDER — ONDANSETRON HCL 4 MG/2ML IJ SOLN: 4.0000 mg | Freq: Four times a day (QID) | INTRAMUSCULAR | Status: DC | PRN

## 2016-09-01 MED ORDER — CILOSTAZOL 100 MG PO TABS
100.0000 mg | ORAL_TABLET | Freq: Two times a day (BID) | ORAL | Status: DC
  Administered 2016-09-02 – 2016-09-06 (×9): 100 mg via ORAL
  Filled 2016-09-01 (×10): qty 1

## 2016-09-01 MED ORDER — MECLIZINE HCL 12.5 MG PO TABS
12.5000 mg | ORAL_TABLET | Freq: Three times a day (TID) | ORAL | Status: DC | PRN
  Filled 2016-09-01: qty 1

## 2016-09-01 MED ORDER — MELOXICAM 15 MG PO TABS
15.0000 mg | ORAL_TABLET | Freq: Every day | ORAL | Status: DC
  Administered 2016-09-02 – 2016-09-03 (×2): 15 mg via ORAL
  Filled 2016-09-01 (×2): qty 1

## 2016-09-01 MED ORDER — SODIUM CHLORIDE 0.9 % IV SOLN
INTRAVENOUS | Status: AC
  Administered 2016-09-01: via INTRAVENOUS

## 2016-09-01 MED ORDER — HYDROMORPHONE HCL 1 MG/ML IJ SOLN
0.5000 mg | Freq: Once | INTRAMUSCULAR | Status: AC
  Administered 2016-09-01: 0.5 mg via INTRAVENOUS
  Filled 2016-09-01: qty 1

## 2016-09-01 MED ORDER — RESOURCE INSTANT PROTEIN PO PWD PACKET
2.0000 | Freq: Two times a day (BID) | ORAL | Status: DC
  Administered 2016-09-02 – 2016-09-06 (×7): 12 g via ORAL
  Filled 2016-09-01 (×10): qty 12

## 2016-09-01 MED ORDER — ONDANSETRON HCL 4 MG PO TABS: 4.0000 mg | ORAL_TABLET | Freq: Four times a day (QID) | ORAL | Status: DC | PRN

## 2016-09-01 NOTE — Progress Notes (Signed)
PHARMACIST - PHYSICIAN COMMUNICATION  CONCERNING: P&T Medication Policy Regarding Oral Bisphosphonates  RECOMMENDATION: Your order for alendronate (Fosamax), ibandronate (Boniva), or risedronate (Actonel) has been discontinued at this time.  If the patient's post-hospital medical condition warrants safe use of this class of drugs, please resume the pre-hospital regimen upon discharge.  DESCRIPTION:  Alendronate (Fosamax), ibandronate (Boniva), and risedronate (Actonel) can cause severe esophageal erosions in patients who are unable to remain upright at least 30 minutes after taking this medication.   Since brief interruptions in therapy are thought to have minimal impact on bone mineral density, the Pharmacy & Therapeutics Committee has established that bisphosphonate orders should be routinely discontinued during hospitalization.   To override this safety policy and permit administration of Boniva, Fosamax, or Actonel in the hospital, prescribers must write "DO NOT HOLD" in the comments section when placing the order for this class of medications.  Thanks Carrie Pena, Carrie Pena 09/01/2016 10:25 PM

## 2016-09-01 NOTE — ED Notes (Signed)
[facility] Carrie Pena [phone] (daughter), [name] granddaughter [phone]

## 2016-09-01 NOTE — ED Notes (Signed)
Delay in getting patient to floor-[facility] and states another 45 minutes before patient can go to floor

## 2016-09-01 NOTE — ED Notes (Signed)
Patient transported to X-ray 

## 2016-09-01 NOTE — ED Provider Notes (Signed)
[facility]-EMERGENCY DEPT Provider Note    By signing my name below, I, Carrie Pena, attest that this documentation has been prepared under the direction and in the presence of [name], MD. Electronically Signed: Earmon Pena, ED Scribe. 09/01/16. 12:55 PM.   History   Chief Complaint Chief Complaint  Patient presents with  . Fall  . Leg Pain    The history is provided by the patient, medical records and the EMS personnel. No language interpreter was used.    HPI Comments:  Carrie Pena is a 82 y.o. female brought in by EMS, who presents to the Emergency Department complaining of a fall that occurred last night. She states she slipped in her bathroom and reports her legs have been hurting lately which may have contributed to the fall. She states she landed on her buttocks. She now reports left knee pain, bilateral thigh pain and reports hitting her left great toe in which she has now placed a bandage. Pt reports decrease in urination. She has been eating and drinking normally until yesterday. She has not taken anything for pain. She has been ambulatory last night after the fall but now reports she cannot bear weight. Moving the lower extremities increase her pain. She denies alleviating factors. She denies head trauma, LOC, HA, neck pain. She is unsure of anticoagulant use.  Past Medical History:  Diagnosis Date  . Carpal tunnel syndrome   . COPD (chronic obstructive pulmonary disease) (HCC)   . GERD (gastroesophageal reflux disease)   . Hyperlipidemia   . Hypertension   . Osteoarthritis     Patient Active Problem List   Diagnosis Date Noted  . Essential hypertension, benign 08/22/2015  . COPD exacerbation (HCC) 08/12/2015  . Respiratory failure (HCC) 08/12/2015  . CAP (community acquired pneumonia) 08/12/2015  . Pain of right lower extremity 05/06/2014  . Swelling of limb 05/06/2014  . Atherosclerosis of native arteries of the extremities with intermittent  claudication 07/18/2011    Past Surgical History:  Procedure Laterality Date  . KNEE ARTHROSCOPY    . left carpal tunnel    . TONSILLECTOMY      OB History    No data available       Home Medications    Prior to Admission medications   Medication Sig Start Date End Date Taking? Authorizing Provider  aspirin 81 MG tablet Take 81 mg by mouth daily.     Yes Historical Provider, MD  b complex vitamins tablet Take 1 tablet by mouth daily.   Yes Historical Provider, MD  Cholecalciferol 1000 UNITS tablet Take 1,000 Units by mouth every Wednesday.    Yes Historical Provider, MD  cilostazol (PLETAL) 100 MG tablet TAKE 1 TABLET TWICE DAILY. 01/14/13  Yes Fransisco Hertz, MD  cyclobenzaprine (FLEXERIL) 5 MG tablet Take 5 mg by mouth 3 (three) times daily as needed for muscle spasms.    Yes Historical Provider, MD  ibandronate (BONIVA) 150 MG tablet Take 150 mg by mouth every 30 (thirty) days. Take in the morning with a full glass of water, on an empty stomach, and do not take anything else by mouth or lie down for the next 30 min.   Yes Historical Provider, MD  ipratropium-albuterol (DUONEB) 0.5-2.5 (3) MG/3ML SOLN Take 3 mLs by nebulization every 4 (four) hours as needed. Patient taking differently: Take 3 mLs by nebulization every 4 (four) hours as needed (for shortness of breath).  08/15/15  Yes Jeralyn Bennett, MD  losartan (COZAAR) 50  MG tablet Take 50 mg by mouth daily. 09/01/16  Yes Historical Provider, MD  meclizine (ANTIVERT) 25 MG tablet Take 12.5 mg by mouth 3 (three) times daily as needed for dizziness.   Yes Historical Provider, MD  meloxicam (MOBIC) 15 MG tablet Take 15 mg by mouth daily.  06/07/16  Yes Historical Provider, MD  simvastatin (ZOCOR) 40 MG tablet Take 40 mg by mouth daily at 6 PM.   Yes Historical Provider, MD  traMADol (ULTRAM) 50 MG tablet Take 1 tablet (50 mg total) by mouth every 6 (six) hours as needed. Patient taking differently: Take 50 mg by mouth every 6 (six)  hours as needed for moderate pain.  08/26/16  Yes Liberty Handy, PA-C  vitamin C (ASCORBIC ACID) 500 MG tablet Take 500 mg by mouth daily.   Yes Historical Provider, MD  amLODipine (NORVASC) 10 MG tablet Take 1 tablet (10 mg total) by mouth daily. Patient not taking: Reported on 09/01/2016 08/15/15   Jeralyn Bennett, MD  ibuprofen (ADVIL,MOTRIN) 200 MG tablet Take 400 mg by mouth every 6 (six) hours as needed (leg pain).    Historical Provider, MD  Protein (PROCEL) POWD Take 2 scoop by mouth 2 (two) times daily.    Historical Provider, MD    Family History Family History  Problem Relation Age of Onset  . Cancer Mother   . Cancer Father   . Hypertension Daughter   . Heart disease Daughter     before age 53    Social History Social History  Substance Use Topics  . Smoking status: Former Smoker    Packs/day: 0.50    Types: Cigarettes    Quit date: 03/12/2015  . Smokeless tobacco: Never Used  . Alcohol use No     Allergies   Tylenol [acetaminophen]   Review of Systems Review of Systems A complete 10 system review of systems was obtained and all systems are negative except as noted in the HPI and PMH.    Physical Exam Updated Vital Signs BP 174/96 (BP Location: Right Arm)   Pulse 84   Temp 98.3 F (36.8 C) (Oral)   Resp 16   SpO2 94%   Physical Exam  Constitutional: She appears well-developed and well-nourished. No distress.  HENT:  Head: Normocephalic and atraumatic.  Eyes: EOM are normal.  Neck: Normal range of motion.  Cardiovascular: Normal rate, regular rhythm and normal heart sounds.   Pulmonary/Chest: Effort normal and breath sounds normal.  Abdominal: Soft. She exhibits no distension. There is no tenderness.  Musculoskeletal: She exhibits tenderness. She exhibits no edema or deformity.  Abrasion to medial aspect of left foot near first MT joint. No shortening or malrotation. Pain in left hip particularly with flexion and internal rotation.    Neurological: She is alert.  NVI  Skin: Skin is warm and dry.  Nursing note and vitals reviewed.    ED Treatments / Results  DIAGNOSTIC STUDIES: Oxygen Saturation is 94% on RA, adequate by my interpretation.   COORDINATION OF CARE: 11:13 AM- Will order imaging and pain medication. Pt verbalizes understanding and agrees to plan.  Medications  ibuprofen (ADVIL,MOTRIN) tablet 400 mg (not administered)  diazepam (VALIUM) tablet 2 mg (not administered)  HYDROmorphone (DILAUDID) injection 0.5 mg (0.5 mg Intravenous Given 09/01/16 1137)    Labs (all labs ordered are listed, but only abnormal results are displayed) Labs Reviewed  BASIC METABOLIC PANEL - Abnormal; Notable for the following:       Result Value  Calcium 8.5 (*)    GFR calc non Af Amer 52 (*)    All other components within normal limits  CBC WITH DIFFERENTIAL/PLATELET - Abnormal; Notable for the following:    RBC 2.95 (*)    Hemoglobin 9.8 (*)    HCT 30.7 (*)    MCV 104.1 (*)    All other components within normal limits  URINALYSIS, ROUTINE W REFLEX MICROSCOPIC (NOT AT [facility]) - Abnormal; Notable for the following:    Color, Urine AMBER (*)    Bilirubin Urine SMALL (*)    Protein, ur 30 (*)    All other components within normal limits  URINE MICROSCOPIC-ADD ON - Abnormal; Notable for the following:    Squamous Epithelial / LPF 0-5 (*)    Bacteria, UA MANY (*)    All other components within normal limits  URINE CULTURE    EKG  EKG Interpretation None       Radiology Ct Hip Left Wo Contrast  Result Date: 09/01/2016 CLINICAL DATA:  Fall with left hip pain last night. Difficulty bearing weight. EXAM: CT OF THE LEFT HIP WITHOUT CONTRAST TECHNIQUE: Multidetector CT imaging of the left hip was performed according to the standard protocol. Multiplanar CT image reconstructions were also generated. COMPARISON:  09/01/2016 FINDINGS: Bones/Joint/Cartilage Mild to moderate degenerative chondral thinning in the left  hip without visible fracture or hip effusion. Ligaments Suboptimally assessed by CT. Muscles and Tendons Mild thickening and increased density distally in the iliopsoas musculotendinous junction compatible with tear or hematoma. I do not see well-defined discontinuity in the distal iliopsoas tendon. Soft tissues Left groin hernia contains a loop of small bowel without strangulation. IMPRESSION: 1. Abnormal high density in the distal iliopsoas muscle and tendon, suspicious for hematoma, less likely tendon rupture. 2. Left groin hernia containing a loop of small bowel without strangulation or obstruction. 3. Mild to moderate degenerative chondral thinning in the left hip. Electronically Signed   By: Gaylyn RongWalter  Liebkemann M.D.   On: 09/01/2016 12:43   Dg Hip Unilat W Or Wo Pelvis 2-3 Views Left  Result Date: 09/01/2016 CLINICAL DATA:  Pain following fall EXAM: DG HIP (WITH OR WITHOUT PELVIS) 2-3V LEFT COMPARISON:  None. FINDINGS: Frontal pelvis as well as frontal and lateral left hip images were obtained. No evident fracture or dislocation. There is mild symmetric narrowing of both hip joints. No erosive change. There are foci of calcification in both common femoral and superficial femoral arteries. IMPRESSION: Mild symmetric narrowing of both hip joints. No fracture dislocation. Multifocal arterial atherosclerosis. Electronically Signed   By: Bretta BangWilliam  Woodruff III M.D.   On: 09/01/2016 11:40    Procedures Procedures (including critical care time)  Medications Ordered in ED Medications  ibuprofen (ADVIL,MOTRIN) tablet 400 mg (not administered)  diazepam (VALIUM) tablet 2 mg (not administered)  HYDROmorphone (DILAUDID) injection 0.5 mg (0.5 mg Intravenous Given 09/01/16 1137)     Initial Impression / Assessment and Plan / ED Course  I have reviewed the triage vital signs and the nursing notes.  Pertinent labs & imaging results that were available during my care of the patient were reviewed by me and  considered in my medical decision making (see chart for details).  Clinical Course     88yF with L hip/thigh pain. Imaging without acute osseous abnormality, but possibly iliopsoas hematoma. Clinically fits symptoms. Pt unable to ambulate in ED after meds. Lives alone. Will get social work involved for home health needs.    I personally preformed the services  scribed in my presence. The recorded information has been reviewed is accurate. Raeford RazorStephen Adden Strout, MD.   Final Clinical Impressions(s) / ED Diagnoses   Final diagnoses:  Hematoma of left iliopsoas muscle, initial encounter  Ambulatory dysfunction    New Prescriptions New Prescriptions   No medications on file     Raeford RazorStephen Yena Tisby, MD 09/01/16 1526

## 2016-09-01 NOTE — ED Notes (Signed)
Bed: WA06 Expected date:  Expected time:  Means of arrival:  Comments: Fall, elderly

## 2016-09-01 NOTE — Progress Notes (Addendum)
CSW spoke with patient and patients granddaughter, [name], regarding discharge plans. Patient has Medicare as primary insurance and does not have a qualifying 3 night stay. CSW asked if patient could pay privately for a skilled nursing facility, patient and granddaughter replied "no". Granddaughter has attempted to find placement for patient at assisted living facilities but patient has denied. Patients grand daughter stated that patient would have to return home but would like services at home including a Child psychotherapistsocial worker. CSW contacted ED CM to help set up services for patient.   Stacy GardnerErin Georgetta Crafton, LCSWA Clinical Social Worker [phone]

## 2016-09-01 NOTE — Progress Notes (Addendum)
Staffed with EDP who states patient needs possible placement.  CSW spoke with patient at bedside with caregiver present, Nena JordanMyrtle Kelley. Caregiver noted she has been assisting patient Tuesday and Friday, however, she stated in the past two weeks, she has been going more days. She stated she goes for four to five hours.  Patient reports she was in the bathroom, getting off of the commode and her feet slid from beneath her during this time. Patient reports she did not hit her head, "just my bottom". Patient reports the caregiver assists her with ADL's and when she is not there, patient stated she completes her own ADL's. Patient reports she is confined to a wheelchair and she has a walker and cane. Patient reports she has a daughter [name] in [place] and another daughter in [place]. [facility]. CSW asked patient if she would be willing to go to a placement if that is determined and she stated yes. No questions noted at this time.   Posey ReaLaVonia Bellagrace Sylvan, LCSWA Clinical Social Worker [phone] 3:07 PM

## 2016-09-01 NOTE — ED Provider Notes (Signed)
Patient presents shared. Patient here for evaluation of injuries following a fall. She lives at home alone and is not safe going home due to ongoing pain she cannot care for herself. Hospitalist consulted for admission for further treatment.   Tilden FossaElizabeth Kysen Wetherington, MD 09/02/16 [phone]

## 2016-09-01 NOTE — Progress Notes (Addendum)
CSW attempted to speak with patient at bedside. Staff in room assisting patient at the time.  Posey ReaLaVonia Charlyne Robertshaw, LCSWA Clinical Social Worker [phone] 2:24 PM

## 2016-09-01 NOTE — ED Triage Notes (Addendum)
Per EMS pt fell in bathroom last night, today she c/o pain in left lower leg, sts unable to bear any weight on left leg. PIV established and pt given 150 mcg of fentanyl en route

## 2016-09-01 NOTE — Care Management Note (Signed)
Case Management Note  Patient Details  Name: Carrie Pena MRN: [id number] Date of Birth: August 20, 1928  Subjective/Objective:             [facility].        Action/Plan: ED [facility], CM reviewed patient's record. As per record, patient lives home alone, is primarily w/c bound.CM contacted next of kin listed in record granddaughter Rowe PavyMelissa Pena [phone] who resides in [place]. Patient lives alone and has a PCA 2 hours twice a week as per granddaughter.  Family, believes patient is not taking medications, and reports getting several calls recently involving falls from [employer] [facility]. Family had been working on long term placement with patient agreement, but patient had changed her mind, as per family. Family does not live in the area. Discussed HH services recommendations and the benefits of [facility] with Granddaughter who is agreeable with plan.  Offered choice [facility]  ED evaluation still in progress. Patient continues to complain of pain  Scale 10/10  IV dilaudid given for pain management.  CM will continue to follow for transitional care planning  Expected Discharge Date:   (unknown) 09/01/2016               Expected Discharge Plan:  Home w Home Health Services  In-House Referral:     Discharge planning Services  CM Consult  Post Acute Care Choice:    Choice offered to:  Patient, Granddaughter Carrie Pena(Carrie)  DME Arranged:    DME Agency:     HH Arranged:  RN, PT, OT, Social Work, Nurse's Aide HH Agency:  [facility]  Status of Service:  Completed, signed off  If discussed at [employer] Meetings, dates discussed:    Additional CommentsMichel Bickers:  Jalia Zuniga, RN 09/01/2016, 7:48 PM

## 2016-09-01 NOTE — ED Notes (Signed)
Sent Urine culture requisition to lab, updated VS.

## 2016-09-01 NOTE — ED Notes (Signed)
Pt's daughter Pattricia Bossnnie and granddaughter Kerry DoryMellissa called, daughter is HPOA, they live out of town and per EMS they stated they feel like pt is "putting on a show because she wants her granddaughter to move in with her"

## 2016-09-01 NOTE — H&P (Signed)
History and Physical    Carrie DollyJeanne R Pena ZOX:[id number] DOB: [birth year] DOA: 09/01/2016  PCP: [name],[name], MD   Patient coming from: Home  Chief Complaint: Left leg pain, inability to bear weight   HPI: Carrie DollyJeanne R Horne is a 82 y.o. female with medical history significant for COPD, GERD, hypertension, and peripheral arterial disease who presents to the emergency department with left leg pain and inability to bear weight after a fall at home the night prior. Patient lives alone and reports slipping in the bathroom the night prior to her presentation, landing on her bottom, and experiencing immediate pain in the hips. She denies striking her head or losing consciousness and is not anticoagulated. She reports being unable to bear weight on the left leg since the incident and, with her pain increasing and becoming unbearable, she is brought into the ED for evaluation. She denies any recent fevers or chills, but notes that her chronic dyspnea has worsened significantly over the past few days. She also reports development of a cough with production of thick white sputum. She denies any chest pain or palpitations, denies abdominal pain, and denies vomiting or diarrhea. She also denies melena or hematochezia.  ED Course: Upon arrival to the ED, patient is found to be afebrile, saturating adequately on room air, it mildly hypertensive, and with vitals otherwise stable. Chemistry panel was unremarkable and CBC is notable for a macrocytic anemia with hemoglobin of 9.8 and MCV of 104.1. Urinalysis is unremarkable. Radiographs of the hips are negative for acute fracture or dislocation, but the patient was unable to bear weight and so this was followed by CT. CT findings are suggestive of a hematoma in the distal iliopsoas muscle without tendon rupture.chest x-ray was obtained in the ED with read pending, but appears to demonstrate hyperinflation without focal infiltrate or edema. Patient was treated with Valium,  Advil, and Dilaudid in the ED. Case management was consulted and unsuccessful in attempts to find placement for the patient. Given her requirement for high doses of analgesics and the inability to bear weight, hospital observation may be more appropriate and the patient will be observed on the medical-surgical unit for ongoing evaluation and management of intractable pain secondary to mechanical fall with iliopsoas hematoma, as well as mild exacerbation and her COPD.  Review of Systems:  All other systems reviewed and apart from HPI, are negative.  Past Medical History:  Diagnosis Date  . Carpal tunnel syndrome   . COPD (chronic obstructive pulmonary disease) (HCC)   . GERD (gastroesophageal reflux disease)   . Hyperlipidemia   . Hypertension   . Osteoarthritis     Past Surgical History:  Procedure Laterality Date  . KNEE ARTHROSCOPY    . left carpal tunnel    . TONSILLECTOMY       reports that she quit smoking about 17 months ago. Her smoking use included Cigarettes. She smoked 0.50 packs per day. She has never used smokeless tobacco. She reports that she does not drink alcohol or use drugs.  Allergies  Allergen Reactions  . Tylenol [Acetaminophen] Other (See Comments)    Pt states "It makes me shake"    Family History  Problem Relation Age of Onset  . Cancer Mother   . Cancer Father   . Hypertension Daughter   . Heart disease Daughter     before age 80     Prior to Admission medications   Medication Sig Start Date End Date Taking? Authorizing Provider  aspirin 81 MG tablet  Take 81 mg by mouth daily.     Yes Historical Provider, MD  b complex vitamins tablet Take 1 tablet by mouth daily.   Yes Historical Provider, MD  Cholecalciferol 1000 UNITS tablet Take 1,000 Units by mouth every Wednesday.    Yes Historical Provider, MD  cilostazol (PLETAL) 100 MG tablet TAKE 1 TABLET TWICE DAILY. 01/14/13  Yes Fransisco Hertz, MD  cyclobenzaprine (FLEXERIL) 5 MG tablet Take 5 mg by  mouth 3 (three) times daily as needed for muscle spasms.    Yes Historical Provider, MD  ibandronate (BONIVA) 150 MG tablet Take 150 mg by mouth every 30 (thirty) days. Take in the morning with a full glass of water, on an empty stomach, and do not take anything else by mouth or lie down for the next 30 min.   Yes Historical Provider, MD  ipratropium-albuterol (DUONEB) 0.5-2.5 (3) MG/3ML SOLN Take 3 mLs by nebulization every 4 (four) hours as needed. Patient taking differently: Take 3 mLs by nebulization every 4 (four) hours as needed (for shortness of breath).  08/15/15  Yes Jeralyn Bennett, MD  losartan (COZAAR) 50 MG tablet Take 50 mg by mouth daily. 09/01/16  Yes Historical Provider, MD  meclizine (ANTIVERT) 25 MG tablet Take 12.5 mg by mouth 3 (three) times daily as needed for dizziness.   Yes Historical Provider, MD  meloxicam (MOBIC) 15 MG tablet Take 15 mg by mouth daily.  06/07/16  Yes Historical Provider, MD  simvastatin (ZOCOR) 40 MG tablet Take 40 mg by mouth daily at 6 PM.   Yes Historical Provider, MD  traMADol (ULTRAM) 50 MG tablet Take 1 tablet (50 mg total) by mouth every 6 (six) hours as needed. Patient taking differently: Take 50 mg by mouth every 6 (six) hours as needed for moderate pain.  08/26/16  Yes Liberty Handy, PA-C  vitamin C (ASCORBIC ACID) 500 MG tablet Take 500 mg by mouth daily.   Yes Historical Provider, MD  amLODipine (NORVASC) 10 MG tablet Take 1 tablet (10 mg total) by mouth daily. Patient not taking: Reported on 09/01/2016 08/15/15   Jeralyn Bennett, MD  ibuprofen (ADVIL,MOTRIN) 200 MG tablet Take 400 mg by mouth every 6 (six) hours as needed (leg pain).    Historical Provider, MD  Protein (PROCEL) POWD Take 2 scoop by mouth 2 (two) times daily.    Historical Provider, MD    Physical Exam: Vitals:   09/01/16 1346 09/01/16 1400 09/01/16 1534 09/01/16 1914  BP: 117/55 120/57 190/93 119/58  Pulse: 82 78 88 93  Resp: 15  18 16   Temp:    98 F (36.7 C)    TempSrc:    Oral  SpO2: 92% 94% 93% 93%      Constitutional: Mild tachypnea, appears uncomfortable  Eyes: PERTLA, lids and conjunctivae normal ENMT: Mucous membranes are moist. Posterior pharynx clear of any exudate or lesions.   Neck: normal, supple, no masses, no thyromegaly Respiratory: Diminished bilaterally with scattered wheezes. Mild tachypnea, no palllor.  Cardiovascular: S1 & S2 heard, regular rate and rhythm. Trace pretibial edema b/l. No significant JVD. Abdomen: No distension, no tenderness, no masses palpated. Bowel sounds normal.  Musculoskeletal: Tender at bilateral hips with hip ROM severely limited by pain; neurovascularly intact at distal LEs. No red, hot, or swollen joint. Normal muscle tone.  Skin: no significant rashes, lesions, ulcers. Warm, dry, well-perfused. Neurologic: CN 2-12 grossly intact. Sensation intact, DTR normal. Strength 5/5 in all 4 limbs.  Psychiatric: Normal judgment and  insight. Alert and oriented x 3. Normal mood and affect.     Labs on Admission: I have personally reviewed following labs and imaging studies  CBC:  Recent Labs Lab 09/01/16 1133  WBC 6.4  NEUTROABS 4.1  HGB 9.8*  HCT 30.7*  MCV 104.1*  PLT 309   Basic Metabolic Panel:  Recent Labs Lab 09/01/16 1133  NA 137  K 3.8  CL 104  CO2 26  GLUCOSE 94  BUN 18  CREATININE 0.96  CALCIUM 8.5*   GFR: CrCl cannot be calculated (Unknown ideal weight.). Liver Function Tests: No results for input(s): AST, ALT, ALKPHOS, BILITOT, PROT, ALBUMIN in the last 168 hours. No results for input(s): LIPASE, AMYLASE in the last 168 hours. No results for input(s): AMMONIA in the last 168 hours. Coagulation Profile: No results for input(s): INR, PROTIME in the last 168 hours. Cardiac Enzymes: No results for input(s): CKTOTAL, CKMB, CKMBINDEX, TROPONINI in the last 168 hours. BNP (last 3 results) No results for input(s): PROBNP in the last 8760 hours. HbA1C: No results for  input(s): HGBA1C in the last 72 hours. CBG: No results for input(s): GLUCAP in the last 168 hours. Lipid Profile: No results for input(s): CHOL, HDL, LDLCALC, TRIG, CHOLHDL, LDLDIRECT in the last 72 hours. Thyroid Function Tests: No results for input(s): TSH, T4TOTAL, FREET4, T3FREE, THYROIDAB in the last 72 hours. Anemia Panel: No results for input(s): VITAMINB12, FOLATE, FERRITIN, TIBC, IRON, RETICCTPCT in the last 72 hours. Urine analysis:    Component Value Date/Time   COLORURINE AMBER (A) 09/01/2016 1234   APPEARANCEUR CLEAR 09/01/2016 1234   LABSPEC 1.024 09/01/2016 1234   PHURINE 6.0 09/01/2016 1234   GLUCOSEU NEGATIVE 09/01/2016 1234   HGBUR NEGATIVE 09/01/2016 1234   BILIRUBINUR SMALL (A) 09/01/2016 1234   KETONESUR NEGATIVE 09/01/2016 1234   PROTEINUR 30 (A) 09/01/2016 1234   UROBILINOGEN 0.2 11/08/2009 1426   NITRITE NEGATIVE 09/01/2016 1234   LEUKOCYTESUR NEGATIVE 09/01/2016 1234   Sepsis Labs: @LABRCNTIP (procalcitonin:4,lacticidven:4) )No results found for this or any previous visit (from the past 240 hour(s)).   Radiological Exams on Admission: Ct Hip Left Wo Contrast  Result Date: 09/01/2016 CLINICAL DATA:  Fall with left hip pain last night. Difficulty bearing weight. EXAM: CT OF THE LEFT HIP WITHOUT CONTRAST TECHNIQUE: Multidetector CT imaging of the left hip was performed according to the standard protocol. Multiplanar CT image reconstructions were also generated. COMPARISON:  09/01/2016 FINDINGS: Bones/Joint/Cartilage Mild to moderate degenerative chondral thinning in the left hip without visible fracture or hip effusion. Ligaments Suboptimally assessed by CT. Muscles and Tendons Mild thickening and increased density distally in the iliopsoas musculotendinous junction compatible with tear or hematoma. I do not see well-defined discontinuity in the distal iliopsoas tendon. Soft tissues Left groin hernia contains a loop of small bowel without strangulation.  IMPRESSION: 1. Abnormal high density in the distal iliopsoas muscle and tendon, suspicious for hematoma, less likely tendon rupture. 2. Left groin hernia containing a loop of small bowel without strangulation or obstruction. 3. Mild to moderate degenerative chondral thinning in the left hip. Electronically Signed   By: Gaylyn RongWalter  Liebkemann M.D.   On: 09/01/2016 12:43   Dg Hip Unilat W Or Wo Pelvis 2-3 Views Left  Result Date: 09/01/2016 CLINICAL DATA:  Pain following fall EXAM: DG HIP (WITH OR WITHOUT PELVIS) 2-3V LEFT COMPARISON:  None. FINDINGS: Frontal pelvis as well as frontal and lateral left hip images were obtained. No evident fracture or dislocation. There is mild symmetric narrowing of both  hip joints. No erosive change. There are foci of calcification in both common femoral and superficial femoral arteries. IMPRESSION: Mild symmetric narrowing of both hip joints. No fracture dislocation. Multifocal arterial atherosclerosis. Electronically Signed   By: Bretta Bang III M.D.   On: 09/01/2016 11:40    EKG: Not performed, will obtain as appropriate.   Assessment/Plan  1. Iliopsoas hematoma with intractable pain  - Pt suffered ground-level mechanical fall the night prior to presentation and has been in severe pain and unable to bear weight on the left leg since  - Imaging is negative for acute fracture or dislocation, but findings suggest a hematoma in the distal iliopsoas muscle without apparent tendon rupture  - She has been given fentanyl en route, Dilaudid and Valium in ED, and continues to complain of pain  - Case management has consulted on the case in the ED and were unsuccessful in attempt to find placement, but pt is requiring high levels of narcotic analgesia and brief observation period in hospital may be more appropriate  - Will continue symptom-management with analgesics; PT eval and treatment requested    2. Fall at home - Pt reports slipping in bathroom the night prior to  her admission  - She reports landing on her bottom, denying head strike or LOC  - Imaging has been negative for acute fracture or dislocation, but notable for the iliopsoas hematoma discussed above  - PT eval requested   3. COPD with acute exacerbation  - Pt presents with increased dyspnea and increased cough with new production of thick white sputum; no fevers or chills  - Plan to treat with nebs, prednisone, and azithromycin    4. Hypertension  - Mildly elevated on admission, likely secondary to pain  - Continue losartan and Norvasc   5. Macrocytic anemia  - Hgb appears stable at 9.8, but MCV is now 104.1 and was previously normal  - Will check B12, folate, and iron studies and supplement as needed    6. PAD  - Pt complains of progressive worsening in her claudication  - Continue Pletal, ASA 81, statin    DVT prophylaxis: SCDs  Code Status: DNR Family Communication: Discussed with patient Disposition Plan: Observe on med-surg Consults called: None Admission status: Observation    Briscoe Deutscher, MD [facility] Pager [phone]  If 7PM-7AM, please contact night-coverage [email or web address] Password [facility]  09/01/2016, 7:24 PM

## 2016-09-01 NOTE — ED Notes (Signed)
Delay in getting patient to floor-states another 15 minutes before patient can go to floor. Nurse is getting assignment.

## 2016-09-02 DIAGNOSIS — D62 Acute posthemorrhagic anemia: Secondary | ICD-10-CM | POA: Diagnosis present

## 2016-09-02 DIAGNOSIS — I5033 Acute on chronic diastolic (congestive) heart failure: Secondary | ICD-10-CM | POA: Diagnosis present

## 2016-09-02 DIAGNOSIS — M79652 Pain in left thigh: Secondary | ICD-10-CM | POA: Diagnosis not present

## 2016-09-02 DIAGNOSIS — T501X5A Adverse effect of loop [high-ceiling] diuretics, initial encounter: Secondary | ICD-10-CM | POA: Diagnosis present

## 2016-09-02 DIAGNOSIS — E785 Hyperlipidemia, unspecified: Secondary | ICD-10-CM | POA: Diagnosis present

## 2016-09-02 DIAGNOSIS — S7012XD Contusion of left thigh, subsequent encounter: Secondary | ICD-10-CM | POA: Diagnosis not present

## 2016-09-02 DIAGNOSIS — J441 Chronic obstructive pulmonary disease with (acute) exacerbation: Secondary | ICD-10-CM | POA: Diagnosis not present

## 2016-09-02 DIAGNOSIS — Z9181 History of falling: Secondary | ICD-10-CM | POA: Diagnosis not present

## 2016-09-02 DIAGNOSIS — Z8249 Family history of ischemic heart disease and other diseases of the circulatory system: Secondary | ICD-10-CM | POA: Diagnosis not present

## 2016-09-02 DIAGNOSIS — R52 Pain, unspecified: Secondary | ICD-10-CM | POA: Diagnosis not present

## 2016-09-02 DIAGNOSIS — Z809 Family history of malignant neoplasm, unspecified: Secondary | ICD-10-CM | POA: Diagnosis not present

## 2016-09-02 DIAGNOSIS — R2689 Other abnormalities of gait and mobility: Secondary | ICD-10-CM | POA: Diagnosis not present

## 2016-09-02 DIAGNOSIS — Y92012 Bathroom of single-family (private) house as the place of occurrence of the external cause: Secondary | ICD-10-CM | POA: Diagnosis not present

## 2016-09-02 DIAGNOSIS — I10 Essential (primary) hypertension: Secondary | ICD-10-CM | POA: Diagnosis not present

## 2016-09-02 DIAGNOSIS — K219 Gastro-esophageal reflux disease without esophagitis: Secondary | ICD-10-CM | POA: Diagnosis present

## 2016-09-02 DIAGNOSIS — J9600 Acute respiratory failure, unspecified whether with hypoxia or hypercapnia: Secondary | ICD-10-CM | POA: Diagnosis present

## 2016-09-02 DIAGNOSIS — D649 Anemia, unspecified: Secondary | ICD-10-CM | POA: Diagnosis not present

## 2016-09-02 DIAGNOSIS — I110 Hypertensive heart disease with heart failure: Secondary | ICD-10-CM | POA: Diagnosis present

## 2016-09-02 DIAGNOSIS — D539 Nutritional anemia, unspecified: Secondary | ICD-10-CM | POA: Diagnosis present

## 2016-09-02 DIAGNOSIS — I739 Peripheral vascular disease, unspecified: Secondary | ICD-10-CM | POA: Diagnosis present

## 2016-09-02 DIAGNOSIS — I5031 Acute diastolic (congestive) heart failure: Secondary | ICD-10-CM | POA: Diagnosis not present

## 2016-09-02 DIAGNOSIS — W010XXA Fall on same level from slipping, tripping and stumbling without subsequent striking against object, initial encounter: Secondary | ICD-10-CM | POA: Diagnosis present

## 2016-09-02 DIAGNOSIS — K5900 Constipation, unspecified: Secondary | ICD-10-CM | POA: Diagnosis present

## 2016-09-02 DIAGNOSIS — N179 Acute kidney failure, unspecified: Secondary | ICD-10-CM | POA: Diagnosis not present

## 2016-09-02 DIAGNOSIS — I5032 Chronic diastolic (congestive) heart failure: Secondary | ICD-10-CM | POA: Diagnosis not present

## 2016-09-02 DIAGNOSIS — Z66 Do not resuscitate: Secondary | ICD-10-CM | POA: Diagnosis present

## 2016-09-02 DIAGNOSIS — J449 Chronic obstructive pulmonary disease, unspecified: Secondary | ICD-10-CM | POA: Diagnosis not present

## 2016-09-02 DIAGNOSIS — S7012XA Contusion of left thigh, initial encounter: Secondary | ICD-10-CM | POA: Diagnosis not present

## 2016-09-02 DIAGNOSIS — Z7982 Long term (current) use of aspirin: Secondary | ICD-10-CM | POA: Diagnosis not present

## 2016-09-02 DIAGNOSIS — R278 Other lack of coordination: Secondary | ICD-10-CM | POA: Diagnosis not present

## 2016-09-02 DIAGNOSIS — Z87891 Personal history of nicotine dependence: Secondary | ICD-10-CM | POA: Diagnosis not present

## 2016-09-02 DIAGNOSIS — M810 Age-related osteoporosis without current pathological fracture: Secondary | ICD-10-CM | POA: Diagnosis present

## 2016-09-02 LAB — BASIC METABOLIC PANEL
ANION GAP: 4 — AB (ref 5–15)
BUN: 17 mg/dL (ref 6–20)
CO2: 27 mmol/L (ref 22–32)
Calcium: 8.1 mg/dL — ABNORMAL LOW (ref 8.9–10.3)
Chloride: 105 mmol/L (ref 101–111)
Creatinine, Ser: 0.88 mg/dL (ref 0.44–1.00)
GFR, EST NON AFRICAN AMERICAN: 58 mL/min — AB (ref >60)
GLUCOSE: 82 mg/dL (ref 65–99)
POTASSIUM: 3.6 mmol/L (ref 3.5–5.1)
Sodium: 136 mmol/L (ref 135–145)

## 2016-09-02 LAB — FOLATE: FOLATE: 29.9 ng/mL (ref >5.9)

## 2016-09-02 LAB — IRON AND TIBC
IRON: 34 ug/dL (ref 28–170)
Saturation Ratios: 16 % (ref 10.4–31.8)
TIBC: 217 ug/dL — AB (ref 250–450)
UIBC: 183 ug/dL

## 2016-09-02 LAB — URINE CULTURE: Culture: NO GROWTH

## 2016-09-02 LAB — GLUCOSE, CAPILLARY: Glucose-Capillary: 80 mg/dL (ref 65–99)

## 2016-09-02 LAB — FERRITIN: FERRITIN: 128 ng/mL (ref 11–307)

## 2016-09-02 LAB — VITAMIN B12: Vitamin B-12: 781 pg/mL (ref 180–914)

## 2016-09-02 MED ORDER — SENNA 8.6 MG PO TABS
2.0000 | ORAL_TABLET | Freq: Every day | ORAL | Status: DC
  Administered 2016-09-02 – 2016-09-05 (×4): 17.2 mg via ORAL
  Filled 2016-09-02 (×4): qty 2

## 2016-09-02 MED ORDER — MOMETASONE FURO-FORMOTEROL FUM 100-5 MCG/ACT IN AERO
2.0000 | INHALATION_SPRAY | Freq: Two times a day (BID) | RESPIRATORY_TRACT | Status: DC
  Administered 2016-09-02 – 2016-09-06 (×7): 2 via RESPIRATORY_TRACT
  Filled 2016-09-02: qty 8.8

## 2016-09-02 MED ORDER — LIP MEDEX EX OINT
TOPICAL_OINTMENT | CUTANEOUS | Status: AC
  Administered 2016-09-02: 10:00:00
  Filled 2016-09-02: qty 7

## 2016-09-02 MED ORDER — DM-GUAIFENESIN ER 30-600 MG PO TB12
1.0000 | ORAL_TABLET | Freq: Two times a day (BID) | ORAL | Status: DC
  Administered 2016-09-02 – 2016-09-06 (×8): 1 via ORAL
  Filled 2016-09-02 (×7): qty 1

## 2016-09-02 MED ORDER — POLYETHYLENE GLYCOL 3350 17 G PO PACK
17.0000 g | PACK | Freq: Every day | ORAL | Status: DC
  Administered 2016-09-02 – 2016-09-06 (×5): 17 g via ORAL
  Filled 2016-09-02 (×5): qty 1

## 2016-09-02 MED ORDER — METHYLPREDNISOLONE SODIUM SUCC 125 MG IJ SOLR
60.0000 mg | Freq: Two times a day (BID) | INTRAMUSCULAR | Status: DC
  Administered 2016-09-02: 60 mg via INTRAVENOUS
  Administered 2016-09-03: 62.5 mg via INTRAVENOUS
  Administered 2016-09-03 – 2016-09-04 (×2): 60 mg via INTRAVENOUS
  Filled 2016-09-02 (×4): qty 0.96

## 2016-09-02 MED ORDER — OXYCODONE HCL 5 MG PO TABS
5.0000 mg | ORAL_TABLET | ORAL | Status: DC | PRN
  Administered 2016-09-02: 5 mg via ORAL
  Filled 2016-09-02: qty 1

## 2016-09-02 MED ORDER — ENSURE ENLIVE PO LIQD
237.0000 mL | Freq: Two times a day (BID) | ORAL | Status: DC
  Administered 2016-09-02 – 2016-09-06 (×5): 237 mL via ORAL

## 2016-09-02 MED ORDER — HYDRALAZINE HCL 20 MG/ML IJ SOLN: 10.0000 mg | INTRAMUSCULAR | Status: DC | PRN

## 2016-09-02 NOTE — Progress Notes (Addendum)
PROGRESS NOTE  Carrie Pena  ZOX:[id number] DOB: May 07, 1928 DOA: 09/01/2016 PCP: [name],[name], MD  Brief Narrative:   The patient is an [age] are old female with history of COPD, GERD, hypertension, peripheral arterial disease who presented to the emergency department with left leg pain and inability to bear weight after a fall at home the night prior. She was found to have no acute fracture or dislocation of her hips on x-ray but because she had such significant pain a CT of the pelvis and upper thigh of the left side was performed which suggested a hematoma in the distal iliopsoas muscle without tendon rupture. She was also wheezing and appeared [name] of breath. Chest x-ray demonstrated no evidence of focal infiltrate or edema. She was admitted for shortness of breath, therapy evaluations, and pain control.  Assessment & Plan:   Principal Problem:   Iliopsoas muscle hematoma, left, initial encounter Active Problems:   COPD with acute exacerbation (HCC)   Essential hypertension, benign   Intractable pain   Macrocytic anemia  . Iliopsoas hematoma with intractable pain secondary to a fall at home - Pt suffered ground-level mechanical fall the night prior to presentation and has been in severe pain and unable to bear weight on the left leg since  - Imaging is negative for acute fracture or dislocation, but findings suggest a hematoma in the distal iliopsoas muscle without apparent tendon rupture  - Transition to oral pain medications  - Physical therapy recommending 24-hour supervision, skilled nursing for rehabilitation, however patient is only observation and insurance will not pay -  Continue falls precautions  COPD with acute exacerbation, Chantry Headen of breath at rest with accessory muscle use, and oxygen levels in the low 90s to high 80s at rest -  Ambulatory/exertional pulse ox - Solu-Medrol -  Continue duo nebs and azithromycin    4. Hypertension, elevated blood pressures -  Continue losartan and Norvasc  -  Start when necessary hydralazine   5. Macrocytic anemia, possibly due to acute blood loss from hematoma - Hgb appears stable at 9.8, but MCV is now 104.1 and was previously normal  - B12, folate, and iron studies within normal limits  6. PAD  - Pt complains of progressive worsening in her claudication  - Continue Pletal, ASA 81, statin    DVT prophylaxis: SCDs  Code Status: DNR Family Communication: Discussed with patient.  Spoke to [name], healthcare power of attorney on the phone at length. Per [name], the patient has significant memory problems, sometimes misinterprets information.  She has had multiple falls at home which are evident from the bruises on her face and body, however she denies any injury. It is possible that she does not remember the injuries. She is unable to prepare her own meals unless they are microwavable. Her daughter feels that she requires 24-hour supervision and assistance, but they are only able to afford 6 hours per day. Daughter would like her to go to a skilled nursing facility soon as possible, but understands that her mother still has some capacity to make decisions regarding her disposition and also for insurance reasons, will be returning home from this admission. Agree that we will maximize home health services and try to arrange a social worker to visit to escalate care and eventually transitioned to a skilled nursing facility in the future. Disposition Plan:  anticipate discharge home tomorrow with home health services if her dyspnea at rest improves  Consultants:   None  Procedures:  None  Antimicrobials:  Anti-infectives    Start     Dose/Rate Route Frequency Ordered Stop   09/01/16 2030  azithromycin (ZITHROMAX) 500 mg in dextrose 5 % 250 mL IVPB     500 mg 250 mL/hr over 60 Minutes Intravenous Every 24 hours 09/01/16 2012             Subjective:  Feels somewhat [name] of breath. Cough productive of  greenish phlegm. Feels wheezy. Has severe pain in the left lateral posterior hip, worse with movement  Objective: Vitals:   09/02/16 0500 09/02/16 0900 09/02/16 1117 09/02/16 1400  BP: 125/64 (!) 156/77  (!) 182/90  Pulse: 79 99  (!) 101  Resp: 14   18  Temp: 98.9 F (37.2 C)   99.1 F (37.3 C)  TempSrc: Oral   Oral  SpO2: 94% 92% 94% 91%  Weight: 62.8 kg (138 lb 7.2 oz)     Height:        Intake/Output Summary (Last 24 hours) at 09/02/16 1803 Last data filed at 09/02/16 1300  Gross per 24 hour  Intake          1250.33 ml  Output              180 ml  Net          1070.33 ml   Filed Weights   09/01/16 2315 09/02/16 0500  Weight: 62.2 kg (137 lb 2 oz) 62.8 kg (138 lb 7.2 oz)    Examination:  General exam:  Adult Female. SCM retractions at rest, audible wheeze from across the room, gasping in between sentences, frequent cough HEENT:  NCAT, MMM Respiratory system: Diminished at the bilateral bases, rhonchorous cough, coarse rales at the bilateral bases, full expiratory wheeze Cardiovascular system: Regular rate and rhythm, normal S1/S2. No murmurs, rubs, gallops or clicks.  Warm extremities Gastrointestinal system: Normal active bowel sounds, soft, nondistended, nontender. MSK:  Normal tone and bulk, no lower extremity edema.  Tenderness to palpation over the posterior left hip without overlying ecchymoses or palpable hematoma Neuro:  Grossly is all extremities    Data Reviewed: I have personally reviewed following labs and imaging studies  CBC:  Recent Labs Lab 09/01/16 1133  WBC 6.4  NEUTROABS 4.1  HGB 9.8*  HCT 30.7*  MCV 104.1*  PLT 309   Basic Metabolic Panel:  Recent Labs Lab 09/01/16 1133 09/02/16 0459  NA 137 136  K 3.8 3.6  CL 104 105  CO2 26 27  GLUCOSE 94 82  BUN 18 17  CREATININE 0.96 0.88  CALCIUM 8.5* 8.1*   GFR: Estimated Creatinine Clearance: 40 mL/min (by C-G formula based on SCr of 0.88 mg/dL). Liver Function Tests: No results for  input(s): AST, ALT, ALKPHOS, BILITOT, PROT, ALBUMIN in the last 168 hours. No results for input(s): LIPASE, AMYLASE in the last 168 hours. No results for input(s): AMMONIA in the last 168 hours. Coagulation Profile: No results for input(s): INR, PROTIME in the last 168 hours. Cardiac Enzymes:  Recent Labs Lab 09/01/16 1940  CKTOTAL 139   BNP (last 3 results) No results for input(s): PROBNP in the last 8760 hours. HbA1C: No results for input(s): HGBA1C in the last 72 hours. CBG:  Recent Labs Lab 09/02/16 0751  GLUCAP 80   Lipid Profile: No results for input(s): CHOL, HDL, LDLCALC, TRIG, CHOLHDL, LDLDIRECT in the last 72 hours. Thyroid Function Tests: No results for input(s): TSH, T4TOTAL, FREET4, T3FREE, THYROIDAB in the last 72 hours. Anemia Panel:  Recent Labs  09/01/16 1133 09/01/16 1926  VITAMINB12  --  781  FOLATE  --  29.9  FERRITIN  --  128  TIBC  --  217*  IRON  --  34  RETICCTPCT 2.9  --    Urine analysis:    Component Value Date/Time   COLORURINE AMBER (A) 09/01/2016 1234   APPEARANCEUR CLEAR 09/01/2016 1234   LABSPEC 1.024 09/01/2016 1234   PHURINE 6.0 09/01/2016 1234   GLUCOSEU NEGATIVE 09/01/2016 1234   HGBUR NEGATIVE 09/01/2016 1234   BILIRUBINUR SMALL (A) 09/01/2016 1234   KETONESUR NEGATIVE 09/01/2016 1234   PROTEINUR 30 (A) 09/01/2016 1234   UROBILINOGEN 0.2 11/08/2009 1426   NITRITE NEGATIVE 09/01/2016 1234   LEUKOCYTESUR NEGATIVE 09/01/2016 1234   Sepsis Labs: @LABRCNTIP (procalcitonin:4,lacticidven:4)  ) Recent Results (from the past 240 hour(s))  Urine culture     Status: None   Collection Time: 09/01/16 12:34 PM  Result Value Ref Range Status   Specimen Description URINE, RANDOM  Final   Special Requests NONE  Final   Culture NO GROWTH Performed at [facility]   Final   Report Status 09/02/2016 FINAL  Final      Radiology Studies: Dg Chest 2 View  Result Date: 09/01/2016 CLINICAL DATA:  Shortness of breath and  cough for 1 day. EXAM: CHEST  2 VIEW COMPARISON:  08/14/2015 FINDINGS: The cardiac silhouette, mediastinal and hilar contours are within normal limits and stable. Stable tortuosity and calcification of the thoracic aorta. Chronic lung changes but no acute pulmonary findings. No pleural effusion or pneumothorax. Stable advanced degenerative changes involving both shoulders. IMPRESSION: No acute cardiopulmonary findings. Electronically Signed   By: Rudie Meyer M.D.   On: 09/01/2016 20:21   Ct Hip Left Wo Contrast  Result Date: 09/01/2016 CLINICAL DATA:  Fall with left hip pain last night. Difficulty bearing weight. EXAM: CT OF THE LEFT HIP WITHOUT CONTRAST TECHNIQUE: Multidetector CT imaging of the left hip was performed according to the standard protocol. Multiplanar CT image reconstructions were also generated. COMPARISON:  09/01/2016 FINDINGS: Bones/Joint/Cartilage Mild to moderate degenerative chondral thinning in the left hip without visible fracture or hip effusion. Ligaments Suboptimally assessed by CT. Muscles and Tendons Mild thickening and increased density distally in the iliopsoas musculotendinous junction compatible with tear or hematoma. I do not see well-defined discontinuity in the distal iliopsoas tendon. Soft tissues Left groin hernia contains a loop of small bowel without strangulation. IMPRESSION: 1. Abnormal high density in the distal iliopsoas muscle and tendon, suspicious for hematoma, less likely tendon rupture. 2. Left groin hernia containing a loop of small bowel without strangulation or obstruction. 3. Mild to moderate degenerative chondral thinning in the left hip. Electronically Signed   By: Gaylyn Rong M.D.   On: 09/01/2016 12:43   Dg Hip Unilat W Or Wo Pelvis 2-3 Views Left  Result Date: 09/01/2016 CLINICAL DATA:  Pain following fall EXAM: DG HIP (WITH OR WITHOUT PELVIS) 2-3V LEFT COMPARISON:  None. FINDINGS: Frontal pelvis as well as frontal and lateral left hip  images were obtained. No evident fracture or dislocation. There is mild symmetric narrowing of both hip joints. No erosive change. There are foci of calcification in both common femoral and superficial femoral arteries. IMPRESSION: Mild symmetric narrowing of both hip joints. No fracture dislocation. Multifocal arterial atherosclerosis. Electronically Signed   By: Bretta Bang III M.D.   On: 09/01/2016 11:40     Scheduled Meds: . aspirin  81 mg Oral Daily  . azithromycin  500  mg Intravenous Q24H  . cilostazol  100 mg Oral BID  . dextromethorphan-guaiFENesin  1 tablet Oral BID  . enoxaparin (LOVENOX) injection  40 mg Subcutaneous QHS  . feeding supplement (ENSURE ENLIVE)  237 mL Oral BID BM  . losartan  50 mg Oral Daily  . meloxicam  15 mg Oral Daily  . methylPREDNISolone (SOLU-MEDROL) injection  60 mg Intravenous BID  . mometasone-formoterol  2 puff Inhalation BID  . polyethylene glycol  17 g Oral Daily  . protein supplement  2 scoop Oral BID  . senna  2 tablet Oral QHS  . simvastatin  40 mg Oral q1800   Continuous Infusions:   LOS: 0 days    Time spent: 30 min    Renae Fickle, MD [facility] Pager [phone]  If 7PM-7AM, please contact night-coverage [email or web address] Password [email or web address] 09/02/2016, 6:03 PM

## 2016-09-02 NOTE — Evaluation (Signed)
Occupational Therapy Evaluation Patient Details Name: Carrie Pena MRN: [id number] DOB: 13-Apr-1928 Today's Date: 09/02/2016    History of Present Illness s/p fall resulting in L iliopsoas muscle hematoma. PMH:  COPD, HTN   Clinical Impression   This 82 year old female was admitted for the above. She lives alone at baseline and needs total +2 assistance for bed mobility and could not stand completely with max +2 assistance. She needs mod A for grooming and max to total +2 assistance for adsl. She will benefit from OT in acute and after discharge.    Follow Up Recommendations  SNF;Supervision/Assistance - 24 hour    Equipment Recommendations   (possibly drop arm commode)    Recommendations for Other Services       Precautions / Restrictions Precautions Precautions: Fall Restrictions Weight Bearing Restrictions: No      Mobility Bed Mobility Overal bed mobility: Needs Assistance;+2 for physical assistance Bed Mobility: Supine to Sit;Sit to Supine     Supine to sit: Total assist;+2 for physical assistance Sit to supine: Total assist;+2 for physical assistance      Transfers Overall transfer level: Needs assistance Equipment used: Rolling walker (2 wheeled) Transfers: Sit to/from Stand Sit to Stand: Max assist;+2 physical assistance         General transfer comment: assist to rise; unable to stand erect.  bil knees buckled    Balance Overall balance assessment: Needs assistance Sitting-balance support: Bilateral upper extremity supported Sitting balance-Leahy Scale: Poor (to fair) Sitting balance - Comments: min guard for safety:  leaning forward                                    ADL Overall ADL's : Needs assistance/impaired Eating/Feeding: Minimal assistance;Bed level (positioning and set up)   Grooming: Moderate assistance;Bed level   Upper Body Bathing: Maximal assistance;Bed level   Lower Body Bathing: Total assistance;+2 for  physical assistance;Bed level   Upper Body Dressing : Maximal assistance;Bed level   Lower Body Dressing: Total assistance;+2 for physical assistance;Bed level                 General ADL Comments: sat EOB.  Stood briefly, but pt's knees buckled.  Did not get to full upright position     Vision     Perception     Praxis      Pertinent Vitals/Pain Pain Assessment: Faces Faces Pain Scale: Hurts whole lot Pain Location: L thigh Pain Intervention(s): Limited activity within patient's tolerance;Monitored during session;Premedicated before session;Repositioned     Hand Dominance Right (but used L more)   Extremity/Trunk Assessment Upper Extremity Assessment Upper Extremity Assessment: Generalized weakness (limited shoulder ROM bil:  L worse than R)           Communication Communication Communication: HOH   Cognition Arousal/Alertness: Awake/alert Behavior During Therapy: WFL for tasks assessed/performed Overall Cognitive Status: Within Functional Limits for tasks assessed                     General Comments       Exercises       Shoulder Instructions      Home Living Family/patient expects to be discharged to:: Unsure                                 Additional Comments: lives alone  and has PCA 2x/wk. Daughter comes every 6 weeks for groceries      Prior Functioning/Environment Level of Independence: Independent with assistive device(s)        Comments: w/c level.  PCA 2x/wk        OT Problem List: Decreased strength;Decreased range of motion;Decreased activity tolerance;Impaired balance (sitting and/or standing);Pain   OT Treatment/Interventions: Self-care/ADL training;Patient/family education;Therapeutic activities;Therapeutic exercise;DME and/or AE instruction;Balance training    OT Goals(Current goals can be found in the care plan section) Acute Rehab OT Goals Patient Stated Goal: none stated OT Goal Formulation: With  patient Time For Goal Achievement: 09/09/16 Potential to Achieve Goals: Good ADL Goals Pt Will Perform Eating: with set-up;bed level;sitting Pt Will Perform Grooming: with set-up;sitting;bed level Pt Will Perform Upper Body Bathing: with min assist;sitting;bed level Pt Will Perform Upper Body Dressing: with min assist;sitting;bed level Pt Will Transfer to Toilet: with mod assist;with +2 assist;squat pivot transfer;with transfer board;bedside commode (drop arm) Additional ADL Goal #1: pt will perform 5 reps A/AAROM to bil shoulders to increase strength for adls  OT Frequency: Min 2X/week   Barriers to D/C:            Co-evaluation              End of Session    Activity Tolerance: Patient limited by fatigue;Patient limited by pain Patient left: in bed;with call bell/phone within reach;with bed alarm set   Time: [id number] OT Time Calculation (min): 22 min Charges:  OT General Charges $OT Visit: 1 Procedure OT Evaluation $OT Eval Low Complexity: 1 Procedure G-Codes: OT G-codes **NOT FOR INPATIENT CLASS** Functional Assessment Tool Used: clinical observation and judgment Functional Limitation: Self care Self Care Current Status (B1478(G8987): At least 80 percent but less than 100 percent impaired, limited or restricted Self Care Goal Status (G9562(G8988): At least 60 percent but less than 80 percent impaired, limited or restricted  Carrie Pena 09/02/2016, 8:27 AM  Carrie Pena, OTR/L [phone] 09/02/2016

## 2016-09-02 NOTE — Clinical Social Work Note (Signed)
MSW has reviewed patient's chart and noted that ED CSW's have met with patient and family to determine most appropriate post-acute setting. Patient does has Medicare and is currently under OBS; family has stated that they are unable to pay privately and requesting home care including a Education officer, museum. RNCM notified/aware. Patient currently has a private caregiver that comes several days per week, 4-5 hrs per day.   No further concerns reported at this time. Per RNCM, home health care has been arranged via [facility]   Medical Social Worker will sign off for now as social work intervention is no longer needed. Please consult [place] again if new need arises.  Glendon Axe, MSW [phone] 09/02/2016 1:00 PM

## 2016-09-02 NOTE — Consult Note (Signed)
   [facility]   09/02/2016  Marsh DollyJeanne R Fundora [birth year] [id number]    Received referral from Primary Care MD office, Dr. Thayer HeadingsBrian Mackenzie. Went to bedside to speak with Ms. Carrie Pena. Explained and offered [facility]. Written consent obtained. She lives alone and placement was sought. However, she is under observation and unable to pay for SNF out of pocket. States her daughters and grandaughter all live out of town. She has a caregiver, Tresa EndoKelly, who stays with her for several hours Tuesdays and Fridays. States the caregiver can come more if Ms. Carrie Pena asks her to.   For transportation needs, Ms. Carrie Pena states she sometimes uses [facility] transportation. States [facility] does not like waiting for her at her MD appointments and states they charge her more. Ms. Carrie Pena endorses transportation can be an issue for her at times.   Denies issues with medications. States she uses [facility] who delivers her medications. Denies having issues with affording her medications. States she has had a problem once or twice because one of her medications had to be physically picked up from the pharmacy and could not be delivered.   States she has Meals on Wheels for food. Denies having issues with meals.   Confirmed best contact number is [phone]. Explained that [facility] will not interfere or replace services provided by home health.   History of COPD, HF, HTN, AFIB. [facility] for [facility] of care and [facility] CS[facility] for transportation needs and assessment for further community resources. Notification sent to inpatient RNCM that [facility] follow. Of note, Ms. Carrie Pena is hard of hearing.    Raiford NobleAtika Yazir Koerber, MSN-Ed, RN,BSN [facility] Hospital Liaison [phone]

## 2016-09-02 NOTE — Evaluation (Signed)
Physical Therapy Evaluation Patient Details Name: Carrie Pena MRN: [id number] DOB: [birth year] Today's Date: 09/02/2016   History of Present Illness  s/p fall resulting in L iliopsoas muscle hematoma. PMH:  COPD, HTN  Clinical Impression  The patient required total assistance of 2 persons to mobilize to bed edge. The patient is unable to stand today due to increased pain and weakness. Pt admitted with above diagnosis. Pt currently with functional limitations due to the deficits listed below (see PT Problem List).  Pt will benefit from skilled PT to increase their independence and safety with mobility to allow discharge to the venue listed below.  '    Follow Up Recommendations SNF;Supervision/Assistance - 24 hour    Equipment Recommendations  None recommended by PT    Recommendations for Other Services       Precautions / Restrictions Precautions Precautions: Fall Restrictions Weight Bearing Restrictions: No      Mobility  Bed Mobility Overal bed mobility: Needs Assistance;+2 for physical assistance Bed Mobility: Supine to Sit;Sit to Supine     Supine to sit: Total assist;+2 for physical assistance Sit to supine: Total assist;+2 for physical assistance   General bed mobility comments: used bed pad and pillows under the legs to move the patient to sitting and then back into bed.  Transfers Overall transfer level: Needs assistance Equipment used: Rolling walker (2 wheeled) Transfers: Sit to/from Stand Sit to Stand: Max assist;+2 physical assistance         General transfer comment: assist to rise; unable to stand erect.  bil knees buckled  Ambulation/Gait                Stairs            Wheelchair Mobility    Modified Rankin (Stroke Patients Only)       Balance Overall balance assessment: Needs assistance Sitting-balance support: Bilateral upper extremity supported Sitting balance-Leahy Scale: Poor Sitting balance - Comments: min guard for  safety:  leaning forward                                     Pertinent Vitals/Pain Pain Assessment: Faces Faces Pain Scale: Hurts whole lot Pain Location: L thigh Pain Descriptors / Indicators: Stabbing;Grimacing;Guarding;Sharp Pain Intervention(s): Repositioned;Monitored during session;Limited activity within patient's tolerance    Home Living Family/patient expects to be discharged to:: Unsure Living Arrangements: Alone               Additional Comments: lives alone and has PCA 2x/wk. Daughter comes every 6 weeks for groceries    Prior Function Level of Independence: Independent with assistive device(s)         Comments: w/c level.  PCA 2x/wk     Hand Dominance   Dominant Hand: Right    Extremity/Trunk Assessment   Upper Extremity Assessment: Defer to OT evaluation           Lower Extremity Assessment: LLE deficits/detail;RLE deficits/detail RLE Deficits / Details: able to flex the hip and knee in supine LLE Deficits / Details: did not bear weight on the  leg. C/O pain in groin area with attempts to move the leg.  Cervical / Trunk Assessment: Kyphotic  Communication   Communication: HOH  Cognition Arousal/Alertness: Lethargic Behavior During Therapy: WFL for tasks assessed/performed Overall Cognitive Status: Within Functional Limits for tasks assessed  General Comments      Exercises     Assessment/Plan    PT Assessment Patient needs continued PT services  PT Problem List Decreased strength;Decreased range of motion;Decreased activity tolerance;Decreased balance;Decreased mobility;Decreased knowledge of precautions;Decreased knowledge of use of DME;Decreased safety awareness;Pain          PT Treatment Interventions DME instruction;Gait training;Functional mobility training;Therapeutic activities;Therapeutic exercise;Patient/family education    PT Goals (Current goals can be found in the Care Plan  section)  Acute Rehab PT Goals Patient Stated Goal: none stated PT Goal Formulation: With patient Time For Goal Achievement: 09/16/16 Potential to Achieve Goals: Fair    Frequency Min 3X/week   Barriers to discharge Decreased caregiver support      Co-evaluation PT/OT/SLP Co-Evaluation/Treatment: Yes Reason for Co-Treatment: For patient/therapist safety PT goals addressed during session: Mobility/safety with mobility OT goals addressed during session: ADL's and self-care       End of Session   Activity Tolerance: Patient limited by pain Patient left: in bed;with call bell/phone within reach Nurse Communication: Mobility status    Functional Assessment Tool Used: clinical judgement Functional Limitation: Mobility: Walking and moving around Mobility: Walking and Moving Around Current Status ([id number](G8978): 100 percent impaired, limited or restricted Mobility: Walking and Moving Around Goal Status [phone](G8979): 0 percent impaired, limited or restricted    Time: 0745-0807 PT Time Calculation (min) (ACUTE ONLY): 22 min   Charges:   PT Evaluation $PT Eval Low Complexity: 1 Procedure     PT G Codes:   PT G-Codes **NOT FOR INPATIENT CLASS** Functional Assessment Tool Used: clinical judgement Functional Limitation: Mobility: Walking and moving around Mobility: Walking and Moving Around Current Status (U9811(G8978): 100 percent impaired, limited or restricted Mobility: Walking and Moving Around Goal Status (B1478(G8979): 0 percent impaired, limited or restricted    Rada HayHill, Johniya Durfee Elizabeth 09/02/2016, 10:24 AM Blanchard KelchKaren Oisin Yoakum PT [phone]

## 2016-09-02 NOTE — Progress Notes (Signed)
Initial Nutrition Assessment  INTERVENTION:   Encouraged intake at meals and snacks.   Ensure Enlive po BID, each supplement provides 350 kcal and 20 grams of protein   NUTRITION DIAGNOSIS:   Inadequate oral intake related to  (pain) as evidenced by per patient/family report.  GOAL:   Patient will meet greater than or equal to 90% of their needs  MONITOR:   PO intake, Supplement acceptance, Weight trends  REASON FOR ASSESSMENT:   Consult Assessment of nutrition requirement/status  ASSESSMENT:   Pt with PMH of COPD, GERD, HTN, PAD, who was admitted after falling at home where she lives alone. Quit smoking 17 months ago.    Per pt she is wheelchair bound and only does transfers from the wheelchair to bed/chair, etc. Pt eats 2 meals per day, usually lunch and dinner. MOW is usually one meal the other is a sandwich or something that can be microwaved. Pt has not tried ensure before. Pt in pain from fall. She reports no weight loss PTA.  D/C plan unclear at this time.   Medications reviewed and include: beneprotein 2 scoops BID Labs reviewed Nutrition-Focused physical exam completed. Findings are no fat depletion, no muscle depletion, and no edema.     Diet Order:  Diet regular Room service appropriate? Yes; Fluid consistency: Thin  Skin:  Reviewed, no issues (foot abrasion)  Last BM:  11/15  Height:   Ht Readings from Last 1 Encounters:  09/01/16 5\' 2"  (1.575 m)    Weight:   Wt Readings from Last 1 Encounters:  09/02/16 138 lb 7.2 oz (62.8 kg)    Ideal Body Weight:  50 kg  BMI:  Body mass index is 25.32 kg/m.  Estimated Nutritional Needs:   Kcal:  1400-1600  Protein:  70-80 grams  Fluid:  > 1.5 L/day  EDUCATION NEEDS:   No education needs identified at this time  Kendell BaneHeather Nikaela Coyne RD, LDN, CNSC [phone] Pager [phone] After Hours Pager

## 2016-09-03 LAB — BRAIN NATRIURETIC PEPTIDE: B Natriuretic Peptide: 82.2 pg/mL (ref 0.0–100.0)

## 2016-09-03 LAB — GLUCOSE, CAPILLARY: Glucose-Capillary: 144 mg/dL — ABNORMAL HIGH (ref 65–99)

## 2016-09-03 MED ORDER — FUROSEMIDE 10 MG/ML IJ SOLN
40.0000 mg | Freq: Once | INTRAMUSCULAR | Status: AC
  Administered 2016-09-03: 40 mg via INTRAVENOUS
  Filled 2016-09-03: qty 4

## 2016-09-03 MED ORDER — BISACODYL 10 MG RE SUPP
20.0000 mg | Freq: Once | RECTAL | Status: AC
  Administered 2016-09-03: 20 mg via RECTAL
  Filled 2016-09-03: qty 2

## 2016-09-03 MED ORDER — AZITHROMYCIN 500 MG PO TABS
500.0000 mg | ORAL_TABLET | Freq: Every day | ORAL | Status: DC
  Administered 2016-09-03 – 2016-09-05 (×3): 500 mg via ORAL
  Filled 2016-09-03 (×3): qty 1

## 2016-09-03 MED ORDER — OXYCODONE HCL 5 MG PO TABS
5.0000 mg | ORAL_TABLET | ORAL | Status: DC | PRN
  Administered 2016-09-03 – 2016-09-06 (×7): 5 mg via ORAL
  Filled 2016-09-03 (×7): qty 1

## 2016-09-03 MED ORDER — MORPHINE SULFATE (PF) 2 MG/ML IV SOLN: 0.5000 mg | INTRAVENOUS | Status: DC | PRN

## 2016-09-03 NOTE — Progress Notes (Signed)
PROGRESS NOTE  Carrie Pena  XLK:[id number] DOB: [birth year] DOA: 09/01/2016 PCP: [name],[name], MD  Brief Narrative:   The patient is an [age] are old female with history of COPD, GERD, hypertension, peripheral arterial disease who presented to the emergency department with left leg pain and inability to bear weight after a fall at home the night prior. She was found to have no acute fracture or dislocation of her hips on x-ray but because she had such significant pain a CT of the pelvis and upper thigh of the left side was performed which suggested a hematoma in the distal iliopsoas muscle without tendon rupture. She was also wheezing and appeared Trenise Turay of breath. Chest x-ray demonstrated no evidence of focal infiltrate or edema. She was admitted for shortness of breath, therapy evaluations, and pain control.  Assessment & Plan:   Principal Problem:   Iliopsoas muscle hematoma, left, initial encounter Active Problems:   COPD with acute exacerbation (HCC)   Essential hypertension, benign   Intractable pain   Macrocytic anemia  . Iliopsoas hematoma with intractable pain secondary to a fall at home - Pt suffered ground-level mechanical fall the night prior to presentation and has been in severe pain and unable to bear weight on the left leg since  - Imaging is negative for acute fracture or dislocation, but findings suggest a hematoma in the distal iliopsoas muscle without apparent tendon rupture  - worsening pain this morning.  Add prn IV morphine -  Continue falls precautions  COPD with acute exacerbation, Khalif Stender of breath at rest with accessory muscle use, marginally improved today, but with course rales at the bases.  Consider acute diastolic heart failure -  Ambulatory/exertional pulse ox -  Continue Solu-Medrol -  Continue duo nebs and azithromycin   -  Lasix 40mg  IV once -  Daily weights - repeat BMP in AM  4. Hypertension, elevated blood pressures - Continue losartan and  Norvasc  -  Continue when necessary hydralazine   5. Macrocytic anemia, possibly due to acute blood loss from hematoma - Hgb appears stable at 9.8, but MCV is now 104.1 and was previously normal  - B12, folate, and iron studies within normal limits  6. PAD  - Pt complains of progressive worsening in her claudication  - Continue Pletal, ASA 81, statin   Constipation, continue miralax, senna, add bisacodyl suppository.  DVT prophylaxis: SCDs  Code Status: DNR Family Communication: Discussed with patient and [name] and Pattricia Bossnnie Disposition Plan:  anticipate discharge home tomorrow with home health services vs. To SNF on Monday depending on progression.  Consultants:   None  Procedures:  None  Antimicrobials:  Anti-infectives    Start     Dose/Rate Route Frequency Ordered Stop   09/03/16 2200  azithromycin (ZITHROMAX) tablet 500 mg     500 mg Oral Daily at bedtime 09/03/16 0906     09/01/16 2030  azithromycin (ZITHROMAX) 500 mg in dextrose 5 % 250 mL IVPB  Status:  Discontinued     500 mg 250 mL/hr over 60 Minutes Intravenous Every 24 hours 09/01/16 2012 09/03/16 0905          Subjective:  Still Taiz Bickle of breath. Wheezy. Has severe pain in the left lateral posterior hip, worse with movement without improvement since yesterday  Objective: Vitals:   09/02/16 2145 09/03/16 0600 09/03/16 0909 09/03/16 1400  BP: (!) 159/104 131/67  (!) 149/75  Pulse: 95 86 98 89  Resp: 18 18 16 20   Temp: 98.1  F (36.7 C) 98.4 F (36.9 C)  98.2 F (36.8 C)  TempSrc: Oral Oral  Oral  SpO2: 94% 96% 94% 94%  Weight:  66.4 kg (146 lb 6.2 oz)    Height:        Intake/Output Summary (Last 24 hours) at 09/03/16 1702 Last data filed at 09/03/16 1312  Gross per 24 hour  Intake               60 ml  Output              450 ml  Net             -390 ml   Filed Weights   09/01/16 2315 09/02/16 0500 09/03/16 0600  Weight: 62.2 kg (137 lb 2 oz) 62.8 kg (138 lb 7.2 oz) 66.4 kg (146 lb 6.2  oz)    Examination:  General exam:  Adult Female. SCM retractions at rest HEENT:  NCAT, MMM Respiratory system: Diminished at the bilateral bases, rhonchorous cough, coarse rales at the bilateral bases, full expiratory wheeze Cardiovascular system: Regular rate and rhythm, normal S1/S2. No murmurs, rubs, gallops or clicks.  Warm extremities Gastrointestinal system: Normal active bowel sounds, soft, moderately distended, nontender. MSK:  Normal tone and bulk, no lower extremity edema.  Tenderness to palpation over the posterior left hip without overlying ecchymoses or palpable hematoma Neuro:  Grossly is all extremities    Data Reviewed: I have personally reviewed following labs and imaging studies  CBC:  Recent Labs Lab 09/01/16 1133  WBC 6.4  NEUTROABS 4.1  HGB 9.8*  HCT 30.7*  MCV 104.1*  PLT 309   Basic Metabolic Panel:  Recent Labs Lab 09/01/16 1133 09/02/16 0459  NA 137 136  K 3.8 3.6  CL 104 105  CO2 26 27  GLUCOSE 94 82  BUN 18 17  CREATININE 0.96 0.88  CALCIUM 8.5* 8.1*   GFR: Estimated Creatinine Clearance: 41 mL/min (by C-G formula based on SCr of 0.88 mg/dL). Liver Function Tests: No results for input(s): AST, ALT, ALKPHOS, BILITOT, PROT, ALBUMIN in the last 168 hours. No results for input(s): LIPASE, AMYLASE in the last 168 hours. No results for input(s): AMMONIA in the last 168 hours. Coagulation Profile: No results for input(s): INR, PROTIME in the last 168 hours. Cardiac Enzymes:  Recent Labs Lab 09/01/16 1940  CKTOTAL 139   BNP (last 3 results) No results for input(s): PROBNP in the last 8760 hours. HbA1C: No results for input(s): HGBA1C in the last 72 hours. CBG:  Recent Labs Lab 09/02/16 0751 09/03/16 0734  GLUCAP 80 144*   Lipid Profile: No results for input(s): CHOL, HDL, LDLCALC, TRIG, CHOLHDL, LDLDIRECT in the last 72 hours. Thyroid Function Tests: No results for input(s): TSH, T4TOTAL, FREET4, T3FREE, THYROIDAB in the  last 72 hours. Anemia Panel:  Recent Labs  09/01/16 1133 09/01/16 1926  VITAMINB12  --  781  FOLATE  --  29.9  FERRITIN  --  128  TIBC  --  217*  IRON  --  34  RETICCTPCT 2.9  --    Urine analysis:    Component Value Date/Time   COLORURINE AMBER (A) 09/01/2016 1234   APPEARANCEUR CLEAR 09/01/2016 1234   LABSPEC 1.024 09/01/2016 1234   PHURINE 6.0 09/01/2016 1234   GLUCOSEU NEGATIVE 09/01/2016 1234   HGBUR NEGATIVE 09/01/2016 1234   BILIRUBINUR SMALL (A) 09/01/2016 1234   KETONESUR NEGATIVE 09/01/2016 1234   PROTEINUR 30 (A) 09/01/2016 1234   UROBILINOGEN 0.2  11/08/2009 1426   NITRITE NEGATIVE 09/01/2016 1234   LEUKOCYTESUR NEGATIVE 09/01/2016 1234   Sepsis Labs: @LABRCNTIP (procalcitonin:4,lacticidven:4)  ) Recent Results (from the past 240 hour(s))  Urine culture     Status: None   Collection Time: 09/01/16 12:34 PM  Result Value Ref Range Status   Specimen Description URINE, RANDOM  Final   Special Requests NONE  Final   Culture NO GROWTH Performed at [facility]   Final   Report Status 09/02/2016 FINAL  Final      Radiology Studies: Dg Chest 2 View  Result Date: 09/01/2016 CLINICAL DATA:  Shortness of breath and cough for 1 day. EXAM: CHEST  2 VIEW COMPARISON:  08/14/2015 FINDINGS: The cardiac silhouette, mediastinal and hilar contours are within normal limits and stable. Stable tortuosity and calcification of the thoracic aorta. Chronic lung changes but no acute pulmonary findings. No pleural effusion or pneumothorax. Stable advanced degenerative changes involving both shoulders. IMPRESSION: No acute cardiopulmonary findings. Electronically Signed   By: Rudie Meyer M.D.   On: 09/01/2016 20:21     Scheduled Meds: . aspirin  81 mg Oral Daily  . azithromycin  500 mg Oral QHS  . cilostazol  100 mg Oral BID  . dextromethorphan-guaiFENesin  1 tablet Oral BID  . enoxaparin (LOVENOX) injection  40 mg Subcutaneous QHS  . feeding supplement (ENSURE  ENLIVE)  237 mL Oral BID BM  . losartan  50 mg Oral Daily  . methylPREDNISolone (SOLU-MEDROL) injection  60 mg Intravenous BID  . mometasone-formoterol  2 puff Inhalation BID  . polyethylene glycol  17 g Oral Daily  . protein supplement  2 scoop Oral BID  . senna  2 tablet Oral QHS  . simvastatin  40 mg Oral q1800   Continuous Infusions:   LOS: 1 day    Time spent: 30 min    Renae Fickle, MD [facility] Pager [phone]  If 7PM-7AM, please contact night-coverage [email or web address] Password [email or web address] 09/03/2016, 5:02 PM

## 2016-09-03 NOTE — Clinical Social Work Note (Signed)
Clinical Social Work Assessment  Patient Details  Name: Carrie Pena MRN: [id number] Date of Birth: 12-15-1927  Date of referral:  09/03/16               Reason for consult:  Discharge Planning, Facility Placement                Permission sought to share information with:  Facility Sport and exercise psychologist, Family Supports Permission granted to share information::     Name::     Patent attorney::  SNF  Relationship::  Daughter  Contact Information:     Housing/Transportation Living arrangements for the past 2 months:  Single Family Home Source of Information:  Patient Patient Interpreter Needed:  None Criminal Activity/Legal Involvement Pertinent to Current Situation/Hospitalization:  No - Comment as needed Significant Relationships:  Adult Children Lives with:  Self Do you feel safe going back to the place where you live?  Yes Need for family participation in patient care:  Yes (Comment)  Care giving concerns:  The patient does not report any caregiving concerns. She just complains about her leg pain.   Social Worker assessment / plan:  CSW met with the patient at bedside to complete assessment. The patient states that she was told she would have to plan for discharge to home because she did not have insurance coverage for SNF due to her patient class. CSW was notified by CM that the patient has been switched to inpatient and will require a medically necessary 3 night inpatient stay. CSW explained that this would require the patient to need hospitalization through Monday. CSW explained SNF search/placement process and answered the patient's questions. The patient prefers [facility] as she has been to the facility in the past. CSW will assist with DC once appropriate.   Employment status:  Disabled (Comment on whether or not currently receiving Disability), Retired Forensic scientist:  Medicare PT Recommendations:  [place] / Referral to community  resources:  [place]  Patient/Family's Response to care:  The patient appears content with the care she has received.   Patient/Family's Understanding of and Emotional Response to Diagnosis, Current Treatment, and Prognosis:  The patient appears to have a good understanding of the reason for her admission and her post DC needs.   Emotional Assessment Appearance:  Appears stated age Attitude/Demeanor/Rapport:  Guarded, Complaining Affect (typically observed):  Accepting, Calm Orientation:  Oriented to Self, Oriented to Place, Oriented to  Time, Oriented to Situation Alcohol / Substance use:  Not Applicable Psych involvement (Current and /or in the community):  No (Comment)  Discharge Needs  Concerns to be addressed:  Care Coordination, Discharge Planning Concerns Readmission within the last 30 days:  No Current discharge risk:  Physical Impairment, Lives alone Barriers to Discharge:  Continued Medical Work up   Fredderick Phenix B, LCSW 09/03/2016, 2:26 PM

## 2016-09-03 NOTE — Care Management Note (Addendum)
Case Management Note  Patient Details  Name: Carrie Pena MRN: [id number] Date of Birth: [birth year]  Subjective/Objective:   COPD with acute exacerbation, Fall                 Action/Plan: Discharge Planning: NCM spoke to pt and she is agreeable to going to SNF, requesting [facility]. NCM contacted CSW for placement to SNF-rehab. NCM contacted dtr, Pattricia Bossnnie # [phone] and provided update.   PCP [name], [name] MD   Expected Discharge Date:                Expected Discharge Plan:  Skilled Nursing Facility  In-House Referral:  Clinical Social Work  Discharge planning Services  CM Consult  Post Acute Care Choice:  NA Choice offered to:  Patient  DME Arranged:  N/A DME Agency:  NA  HH Arranged:  RN, PT, OT, Social Work, Nurse's Aide HH Agency:  [facility]  Status of Service:  Completed, signed off  If discussed at [employer] Meetings, dates discussed:    Additional Comments:  Elliot CousinShavis, Fauna Neuner Ellen, RN 09/03/2016, 12:55 PM

## 2016-09-03 NOTE — [place] FL2 (Signed)
[place] MEDICAID FL2 LEVEL OF CARE SCREENING TOOL     IDENTIFICATION  Patient Name: Carrie Pena Birthdate: Jul 02, 1928 Sex: female Admission Date (Current Location): 09/01/2016  [facility] [place] [place]   Facility and Address:  [facility], [address], [place], [place] [address]      Provider Number: [id number]  Attending Physician Name and Address:  Renae FickleMackenzie Short, MD  Relative Name and Phone Number:       Current Level of Care: Hospital Recommended Level of Care: Skilled Nursing Facility Prior Approval Number:    Date Approved/Denied:   PASRR Number: [id number]  Discharge Plan: SNF    Current Diagnoses: Patient Active Problem List   Diagnosis Date Noted  . Iliopsoas muscle hematoma, left, initial encounter 09/01/2016  . Intractable pain 09/01/2016  . Macrocytic anemia 09/01/2016  . Essential hypertension, benign 08/22/2015  . COPD with acute exacerbation (HCC) 08/12/2015  . Respiratory failure (HCC) 08/12/2015  . CAP (community acquired pneumonia) 08/12/2015  . Pain of right lower extremity 05/06/2014  . Swelling of limb 05/06/2014  . Atherosclerosis of native arteries of the extremities with intermittent claudication 07/18/2011    Orientation RESPIRATION BLADDER Height & Weight     Self, Time, Situation, Place  Normal Continent Weight: 66.4 kg (146 lb 6.2 oz) Height:  5\' 2"  (157.5 cm)  BEHAVIORAL SYMPTOMS/MOOD NEUROLOGICAL BOWEL NUTRITION STATUS   (NONE)  (NONE) Continent Diet (Regular)  AMBULATORY STATUS COMMUNICATION OF NEEDS Skin   Extensive Assist Verbally Normal                       Personal Care Assistance Level of Assistance  Bathing, Feeding, Dressing Bathing Assistance: Limited assistance Feeding assistance: Independent Dressing Assistance: Limited assistance     Functional Limitations Info  Sight, Speech, Hearing Sight Info: Adequate Hearing Info: Adequate Speech Info: Adequate     SPECIAL CARE FACTORS FREQUENCY  PT (By licensed PT), OT (By licensed OT)     PT Frequency: 5/week OT Frequency: 5/week            Contractures Contractures Info: Not present    Additional Factors Info  Code Status, Allergies Code Status Info: DNR Allergies Info: Tylenol           Current Medications (09/03/2016):  This is the current hospital active medication list Current Facility-Administered Medications  Medication Dose Route Frequency Provider Last Rate Last Dose  . aspirin chewable tablet 81 mg  81 mg Oral Daily Briscoe Deutscherimothy S Opyd, MD   81 mg at 09/03/16 1104  . azithromycin (ZITHROMAX) tablet 500 mg  500 mg Oral QHS Hessie KnowsJustin M Legge, [facility]      . cilostazol (PLETAL) tablet 100 mg  100 mg Oral BID Briscoe Deutscherimothy S Opyd, MD   100 mg at 09/03/16 1104  . cyclobenzaprine (FLEXERIL) tablet 5 mg  5 mg Oral TID PRN Briscoe Deutscherimothy S Opyd, MD   5 mg at 09/02/16 2142  . dextromethorphan-guaiFENesin (MUCINEX DM) 30-600 MG per 12 hr tablet 1 tablet  1 tablet Oral BID Renae FickleMackenzie Short, MD   1 tablet at 09/03/16 1104  . enoxaparin (LOVENOX) injection 40 mg  40 mg Subcutaneous QHS Briscoe Deutscherimothy S Opyd, MD   40 mg at 09/02/16 2136  . feeding supplement (ENSURE ENLIVE) (ENSURE ENLIVE) liquid 237 mL  237 mL Oral BID BM Renae FickleMackenzie Short, MD   237 mL at 09/02/16 1542  . hydrALAZINE (APRESOLINE) injection 10 mg  10 mg Intravenous Q4H  PRN Renae FickleMackenzie Short, MD      . ipratropium-albuterol (DUONEB) 0.5-2.5 (3) MG/3ML nebulizer solution 3 mL  3 mL Nebulization Q4H PRN Briscoe Deutscherimothy S Opyd, MD   3 mL at 09/02/16 1116  . losartan (COZAAR) tablet 50 mg  50 mg Oral Daily Briscoe Deutscherimothy S Opyd, MD   50 mg at 09/03/16 1104  . meclizine (ANTIVERT) tablet 12.5 mg  12.5 mg Oral TID PRN Briscoe Deutscherimothy S Opyd, MD      . methylPREDNISolone sodium succinate (SOLU-MEDROL) 125 mg/2 mL injection 60 mg  60 mg Intravenous BID Renae FickleMackenzie Short, MD   60 mg at 09/03/16 1105  . mometasone-formoterol (DULERA) 100-5 MCG/ACT inhaler 2 puff  2 puff Inhalation BID  Renae FickleMackenzie Short, MD   2 puff at 09/03/16 0909  . morphine 2 MG/ML injection 0.5 mg  0.5 mg Intravenous Q1H PRN Renae FickleMackenzie Short, MD      . ondansetron (ZOFRAN) tablet 4 mg  4 mg Oral Q6H PRN Briscoe Deutscherimothy S Opyd, MD       Or  . ondansetron (ZOFRAN) injection 4 mg  4 mg Intravenous Q6H PRN Briscoe Deutscherimothy S Opyd, MD      . oxyCODONE (Oxy IR/ROXICODONE) immediate release tablet 5 mg  5 mg Oral Q4H PRN Renae FickleMackenzie Short, MD      . polyethylene glycol (MIRALAX / GLYCOLAX) packet 17 g  17 g Oral Daily Renae FickleMackenzie Short, MD   17 g at 09/03/16 1105  . protein supplement (RESOURCE BENEPROTEIN) powder packet 12 g  2 scoop Oral BID Briscoe Deutscherimothy S Opyd, MD   12 g at 09/03/16 0854  . senna (SENOKOT) tablet 17.2 mg  2 tablet Oral QHS Renae FickleMackenzie Short, MD   17.2 mg at 09/02/16 2138  . simvastatin (ZOCOR) tablet 40 mg  40 mg Oral q1800 Briscoe Deutscherimothy S Opyd, MD   40 mg at 09/02/16 1825     Discharge Medications: Please see discharge summary for a list of discharge medications.  Relevant Imaging Results:  Relevant Lab Results:   Additional Information SS# [id number]  Venita LickCampbell, [place] Stell B, LCSW

## 2016-09-03 NOTE — Progress Notes (Signed)
PHARMACIST - PHYSICIAN COMMUNICATION DR:   Malachi BondsShort CONCERNING: Antibiotic IV to Oral Route Change Policy  RECOMMENDATION: This patient is receiving Zithromax by the intravenous route.  Based on criteria approved by the Pharmacy and Therapeutics Committee, the antibiotic(s) is/are being converted to the equivalent oral dose form(s).   DESCRIPTION: These criteria include:  Patient being treated for a respiratory tract infection, urinary tract infection, cellulitis or clostridium difficile associated diarrhea if on metronidazole  The patient is not neutropenic and does not exhibit a GI malabsorption state  The patient is eating (either orally or via tube) and/or has been taking other orally administered medications for a least 24 hours  The patient is improving clinically and has a Tmax < 100.5  If you have questions about this conversion, please contact the Pharmacy Department  []   [phone]( [phone] )  [facility] []   [phone]( [phone] )  [facility] []   [phone]( [phone] )  [facility] []   [phone]( [phone] )  [facility] [x]   [phone]( [phone] )  [facility]  Hessie KnowsJustin M Katlyne Nishida, PharmD, BCPS Pager [phone] 09/03/2016 9:05 AM

## 2016-09-03 NOTE — Clinical Social Work Placement (Signed)
   CLINICAL SOCIAL WORK PLACEMENT  NOTE  Date:  09/03/2016  Patient Details  Name: Carrie Pena MRN: [id number] Date of Birth: 1928/03/22  Clinical Social Work is seeking post-discharge placement for this patient at the Skilled  Nursing Facility level of care (*CSW will initial, date and re-position this form in  chart as items are completed):  Yes   Patient/family provided with [place] Clinical Social Work Department's list of facilities offering this level of care within the geographic area requested by the patient (or if unable, by the patient's family).  Yes   Patient/family informed of their freedom to choose among providers that offer the needed level of care, that participate in Medicare, Medicaid or managed care program needed by the patient, have an available bed and are willing to accept the patient.  Yes   Patient/family informed of [facility] ownership interest in [facility] and [facility], as well as of the fact that they are under no obligation to receive care at these facilities.  PASRR submitted to EDS on       PASRR number received on       Existing PASRR number confirmed on 09/03/16     FL2 transmitted to all facilities in geographic area requested by pt/family on 09/03/16     FL2 transmitted to all facilities within larger geographic area on       Patient informed that his/her managed care company has contracts with or will negotiate with certain facilities, including the following:            Patient/family informed of bed offers received.  Patient chooses bed at       Physician recommends and patient chooses bed at      Patient to be transferred to   on  .  Patient to be transferred to facility by       Patient family notified on   of transfer.  Name of family member notified:        PHYSICIAN Please prepare priority discharge summary, including medications, Please prepare prescriptions, Please sign FL2, Please sign DNR      Additional Comment:    _______________________________________________ Venita Lickampbell, Hoang Reich B, LCSW 09/03/2016, 3:24 PM

## 2016-09-04 LAB — GLUCOSE, CAPILLARY: GLUCOSE-CAPILLARY: 138 mg/dL — AB (ref 65–99)

## 2016-09-04 MED ORDER — PREDNISONE 20 MG PO TABS
40.0000 mg | ORAL_TABLET | Freq: Every day | ORAL | Status: DC
  Administered 2016-09-05 – 2016-09-06 (×2): 40 mg via ORAL
  Filled 2016-09-04 (×2): qty 2

## 2016-09-04 MED ORDER — POTASSIUM CHLORIDE CRYS ER 20 MEQ PO TBCR
40.0000 meq | EXTENDED_RELEASE_TABLET | Freq: Once | ORAL | Status: AC
  Administered 2016-09-04: 40 meq via ORAL
  Filled 2016-09-04: qty 2

## 2016-09-04 MED ORDER — SODIUM CHLORIDE 0.9 % IV BOLUS (SEPSIS)
250.0000 mL | Freq: Once | INTRAVENOUS | Status: AC
  Administered 2016-09-04: 250 mL via INTRAVENOUS

## 2016-09-04 MED ORDER — FUROSEMIDE 10 MG/ML IJ SOLN
40.0000 mg | Freq: Once | INTRAMUSCULAR | Status: AC
  Administered 2016-09-04: 40 mg via INTRAVENOUS
  Filled 2016-09-04: qty 4

## 2016-09-04 NOTE — Progress Notes (Signed)
Pt's BP low at 1400 check. MD paged. 250cc NS bolus ordered. Had bolus running slowly starting at 1600. However, about halfway through bolus, pt's IV infiltrated. Pt's BP raised up to 126/86. MD repaged. MD stated it was fine to leave IV out.  Will continue to monitor.  Sherron MondayGood, Cabela Pacifico L

## 2016-09-04 NOTE — Progress Notes (Signed)
PROGRESS NOTE  Carrie DollyJeanne R Pena  JXB:[id number] DOB: 01/25/1928 DOA: 09/01/2016 PCP: [name],[name], MD  Brief Narrative:   The patient is an [age] are old female with history of COPD, GERD, hypertension, peripheral arterial disease who presented to the emergency department with left leg pain and inability to bear weight after a fall at home the night prior. She was found to have no acute fracture or dislocation of her hips on x-ray but because she had such significant pain a CT of the pelvis and upper thigh of the left side was performed which suggested a hematoma in the distal iliopsoas muscle without tendon rupture. She was also wheezing and appeared Trayshawn Durkin of breath. Chest x-ray demonstrated no evidence of focal infiltrate or edema. She was admitted for shortness of breath, therapy evaluations, and pain control.  Assessment & Plan:   Principal Problem:   Iliopsoas muscle hematoma, left, initial encounter Active Problems:   COPD with acute exacerbation (HCC)   Essential hypertension, benign   Intractable pain   Macrocytic anemia  Iliopsoas hematoma with intractable pain secondary to a fall at home - Pt suffered ground-level mechanical fall the night prior to presentation and has been in severe pain and unable to bear weight on the left leg since  - Imaging is negative for acute fracture or dislocation, but findings suggest a hematoma in the distal iliopsoas muscle without apparent tendon rupture  -  Continue steroids as below for antiinflammatory properties -  Avoid NSAIDS due to concern for diastolic heart failure/fluid retention and worsening of hematoma -  Tylenol allergy -  Continue prn oxycodone > stool regimen below -  To SNF for rehab  Acute respiratory failure seconadry to acute COPD exacerbation and probable acute on chronic diastolic heart failure, improved and no long requiring oxygen but still has rales at bases.   -  Ambulatory/exertional pulse ox -  Continue Solu-Medrol  today and change to prednisone tomorrow -  Continue duo nebs and azithromycin   -  Lasix 40mg  IV once -  Wt increased and I/O not strictly recorded.  Doubt accuracy as patient voided frequently yesterday - repeat BMP in AM  4. Hypertension, elevated blood pressures - Continue losartan and Norvasc  -  Continue when necessary hydralazine   5. Macrocytic anemia, possibly due to acute blood loss from hematoma - Hgb appears stable at 9.8, but MCV is now 104.1 and was previously normal  - B12, folate, and iron studies within normal limits  6. PAD  - Pt complains of progressive worsening in her claudication  - Continue Pletal, ASA 81, statin   Constipation, had 1 BM yesterday -  continue miralax, senna -  Prn bisacodyl  DVT prophylaxis: SCDs  Code Status: DNR Family Communication: Discussed with patient alone  Disposition Plan:  anticipate discharge to SNF on Monday  Consultants:   None  Procedures:  None  Antimicrobials:  Anti-infectives    Start     Dose/Rate Route Frequency Ordered Stop   09/03/16 2200  azithromycin (ZITHROMAX) tablet 500 mg     500 mg Oral Daily at bedtime 09/03/16 0906     09/01/16 2030  azithromycin (ZITHROMAX) 500 mg in dextrose 5 % 250 mL IVPB  Status:  Discontinued     500 mg 250 mL/hr over 60 Minutes Intravenous Every 24 hours 09/01/16 2012 09/03/16 0905          Subjective:  Persistent cough.  Persistent severe pain in the left lateral posterior hip.  Feels  less Rylynne Schicker of breath  Objective: Vitals:   09/03/16 1400 09/03/16 1934 09/03/16 2049 09/04/16 0619  BP: (!) 149/75  (!) 153/85 (!) 147/82  Pulse: 89  83 85  Resp: 20   18  Temp: 98.2 F (36.8 C)  99.1 F (37.3 C) 98.2 F (36.8 C)  TempSrc: Oral  Oral Axillary  SpO2: 94% 94% 92% 93%  Weight:      Height:        Intake/Output Summary (Last 24 hours) at 09/04/16 1415 Last data filed at 09/04/16 1145  Gross per 24 hour  Intake              120 ml  Output                0  ml  Net              120 ml   Filed Weights   09/01/16 2315 09/02/16 0500 09/03/16 0600  Weight: 62.2 kg (137 lb 2 oz) 62.8 kg (138 lb 7.2 oz) 66.4 kg (146 lb 6.2 oz)    Examination:  General exam:  Adult Female. No acute distress HEENT:  NCAT, MMM Respiratory system: Diminished at the bilateral bases, rhonchorous cough, decreasing rales at the bilateral bases, persistent wheeze Cardiovascular system: Regular rate and rhythm, normal S1/S2. No murmurs, rubs, gallops or clicks.  Warm extremities Gastrointestinal system: Normal active bowel sounds, soft, moderately distended, nontender. MSK:  Normal tone and bulk, no lower extremity edema.  Tenderness to palpation over the posterior left hip without overlying ecchymoses or palpable hematoma Neuro:  Grossly is all extremities    Data Reviewed: I have personally reviewed following labs and imaging studies  CBC:  Recent Labs Lab 09/01/16 1133  WBC 6.4  NEUTROABS 4.1  HGB 9.8*  HCT 30.7*  MCV 104.1*  PLT 309   Basic Metabolic Panel:  Recent Labs Lab 09/01/16 1133 09/02/16 0459  NA 137 136  K 3.8 3.6  CL 104 105  CO2 26 27  GLUCOSE 94 82  BUN 18 17  CREATININE 0.96 0.88  CALCIUM 8.5* 8.1*   GFR: Estimated Creatinine Clearance: 41 mL/min (by C-G formula based on SCr of 0.88 mg/dL). Liver Function Tests: No results for input(s): AST, ALT, ALKPHOS, BILITOT, PROT, ALBUMIN in the last 168 hours. No results for input(s): LIPASE, AMYLASE in the last 168 hours. No results for input(s): AMMONIA in the last 168 hours. Coagulation Profile: No results for input(s): INR, PROTIME in the last 168 hours. Cardiac Enzymes:  Recent Labs Lab 09/01/16 1940  CKTOTAL 139   BNP (last 3 results) No results for input(s): PROBNP in the last 8760 hours. HbA1C: No results for input(s): HGBA1C in the last 72 hours. CBG:  Recent Labs Lab 09/02/16 0751 09/03/16 0734 09/04/16 0848  GLUCAP 80 144* 138*   Lipid Profile: No results  for input(s): CHOL, HDL, LDLCALC, TRIG, CHOLHDL, LDLDIRECT in the last 72 hours. Thyroid Function Tests: No results for input(s): TSH, T4TOTAL, FREET4, T3FREE, THYROIDAB in the last 72 hours. Anemia Panel:  Recent Labs  09/01/16 1926  VITAMINB12 781  FOLATE 29.9  FERRITIN 128  TIBC 217*  IRON 34   Urine analysis:    Component Value Date/Time   COLORURINE AMBER (A) 09/01/2016 1234   APPEARANCEUR CLEAR 09/01/2016 1234   LABSPEC 1.024 09/01/2016 1234   PHURINE 6.0 09/01/2016 1234   GLUCOSEU NEGATIVE 09/01/2016 1234   HGBUR NEGATIVE 09/01/2016 1234   BILIRUBINUR SMALL (A) 09/01/2016 1234  KETONESUR NEGATIVE 09/01/2016 1234   PROTEINUR 30 (A) 09/01/2016 1234   UROBILINOGEN 0.2 11/08/2009 1426   NITRITE NEGATIVE 09/01/2016 1234   LEUKOCYTESUR NEGATIVE 09/01/2016 1234   Sepsis Labs: @LABRCNTIP (procalcitonin:4,lacticidven:4)  ) Recent Results (from the past 240 hour(s))  Urine culture     Status: None   Collection Time: 09/01/16 12:34 PM  Result Value Ref Range Status   Specimen Description URINE, RANDOM  Final   Special Requests NONE  Final   Culture NO GROWTH [facility]   Final   Report Status 09/02/2016 FINAL  Final      Radiology Studies: No results found.   Scheduled Meds: . aspirin  81 mg Oral Daily  . azithromycin  500 mg Oral QHS  . cilostazol  100 mg Oral BID  . dextromethorphan-guaiFENesin  1 tablet Oral BID  . enoxaparin (LOVENOX) injection  40 mg Subcutaneous QHS  . feeding supplement (ENSURE ENLIVE)  237 mL Oral BID BM  . losartan  50 mg Oral Daily  . methylPREDNISolone (SOLU-MEDROL) injection  60 mg Intravenous BID  . mometasone-formoterol  2 puff Inhalation BID  . polyethylene glycol  17 g Oral Daily  . protein supplement  2 scoop Oral BID  . senna  2 tablet Oral QHS  . simvastatin  40 mg Oral q1800   Continuous Infusions:   LOS: 2 days    Time spent: 30 min    Renae FickleSHORT, Grabiel Schmutz, MD [facility] Pager  [phone]  If 7PM-7AM, please contact night-coverage [email or web address] Password [email or web address] 09/04/2016, 2:15 PM

## 2016-09-04 NOTE — Consult Note (Signed)
I spoke to Dr. Malachi BondsShort who states that psych consult is no longer necessary. She reports that patient and her family agreed to SNF placement.  Dr. Thedore MinsMojeed Sweetie Giebler.

## 2016-09-04 NOTE — Clinical Social Work Note (Signed)
CSW spoke with pt's daughter to present bed offers. [facility]. CSW updated facility. It is anticipated that pt will discharge on Monday. CSW will continue to follow to facilitate [facility] when [facility].   Dede QuerySarah Niamya Vittitow, MSW, LCSW  Clinical Social Worker  [phone]

## 2016-09-05 DIAGNOSIS — N179 Acute kidney failure, unspecified: Secondary | ICD-10-CM

## 2016-09-05 LAB — BASIC METABOLIC PANEL
ANION GAP: 6 (ref 5–15)
Anion gap: 10 (ref 5–15)
BUN: 64 mg/dL — ABNORMAL HIGH (ref 6–20)
BUN: 65 mg/dL — ABNORMAL HIGH (ref 6–20)
CALCIUM: 8.7 mg/dL — AB (ref 8.9–10.3)
CALCIUM: 8.5 mg/dL — AB (ref 8.9–10.3)
CO2: 26 mmol/L (ref 22–32)
CO2: 24 mmol/L (ref 22–32)
CREATININE: 1.66 mg/dL — AB (ref 0.44–1.00)
Chloride: 102 mmol/L (ref 101–111)
Chloride: 104 mmol/L (ref 101–111)
Creatinine, Ser: 1.59 mg/dL — ABNORMAL HIGH (ref 0.44–1.00)
GFR, EST AFRICAN AMERICAN: 33 mL/min — AB (ref >60)
GFR, EST AFRICAN AMERICAN: 31 mL/min — AB (ref >60)
GFR, EST NON AFRICAN AMERICAN: 28 mL/min — AB (ref >60)
GFR, EST NON AFRICAN AMERICAN: 27 mL/min — AB (ref >60)
GLUCOSE: 116 mg/dL — AB (ref 65–99)
Glucose, Bld: 130 mg/dL — ABNORMAL HIGH (ref 65–99)
POTASSIUM: 5.1 mmol/L (ref 3.5–5.1)
Potassium: 4 mmol/L (ref 3.5–5.1)
SODIUM: 134 mmol/L — AB (ref 135–145)
SODIUM: 138 mmol/L (ref 135–145)

## 2016-09-05 LAB — GLUCOSE, CAPILLARY: GLUCOSE-CAPILLARY: 118 mg/dL — AB (ref 65–99)

## 2016-09-05 MED ORDER — SODIUM CHLORIDE 0.9 % IV BOLUS (SEPSIS)
1000.0000 mL | Freq: Once | INTRAVENOUS | Status: AC
  Administered 2016-09-05: 1000 mL via INTRAVENOUS

## 2016-09-05 MED ORDER — ENOXAPARIN SODIUM 30 MG/0.3ML ~~LOC~~ SOLN
30.0000 mg | Freq: Every day | SUBCUTANEOUS | Status: DC
  Administered 2016-09-05: 30 mg via SUBCUTANEOUS
  Filled 2016-09-05: qty 0.3

## 2016-09-05 MED ORDER — SODIUM CHLORIDE 0.9 % IV SOLN
INTRAVENOUS | Status: DC
  Administered 2016-09-05 – 2016-09-06 (×2): via INTRAVENOUS

## 2016-09-05 NOTE — Progress Notes (Signed)
Physical Therapy Treatment Patient Details Name: Carrie Pena MRN: [id number] DOB: [birth year] Today's Date: 09/05/2016    History of Present Illness s/p fall resulting in L iliopsoas muscle hematoma. PMH:  COPD, HTN    PT Comments    Progressing slowly with mobility. Continues to require +2 for safe mobility. Pt stood x4 from recliner with RW. Continue to recommend SNF for rehab.   Follow Up Recommendations  SNF     Equipment Recommendations  None recommended by PT    Recommendations for Other Services       Precautions / Restrictions Precautions Precautions: Fall Restrictions Weight Bearing Restrictions: No    Mobility  Bed Mobility               General bed mobility comments: oob in recliner  Transfers Overall transfer level: Needs assistance Equipment used: Rolling walker (2 wheeled) Transfers: Sit to/from Stand Sit to Stand: Mod assist;+2 safety/equipment;+2 physical assistance         General transfer comment: Assit to rise, stabilze, control descent. VCs safety, hand placement. Sit to stand x4-pt only able to tolerate standing for ~15 seconds each attempt.   Ambulation/Gait                 Stairs            Wheelchair Mobility    Modified Rankin (Stroke Patients Only)       Balance Overall balance assessment: Needs assistance           Standing balance-Leahy Scale: Poor                      Cognition Arousal/Alertness: Awake/alert Behavior During Therapy: WFL for tasks assessed/performed Overall Cognitive Status: Within Functional Limits for tasks assessed                      Exercises      General Comments        Pertinent Vitals/Pain Pain Assessment: Faces Faces Pain Scale: Hurts even more Pain Location: Bil LEs Pain Descriptors / Indicators: Guarding Pain Intervention(s): Limited activity within patient's tolerance;Repositioned    Home Living                      Prior  Function            PT Goals (current goals can now be found in the care plan section) Progress towards PT goals: Progressing toward goals (slowly)    Frequency    Min 3X/week      PT Plan Current plan remains appropriate    Co-evaluation             End of Session Equipment Utilized During Treatment: Gait belt Activity Tolerance: Patient limited by fatigue;Patient limited by pain Patient left: in chair;with call bell/phone within reach;with family/visitor present     Time: [id number] PT Time Calculation (min) (ACUTE ONLY): 14 min  Charges:  $Therapeutic Activity: 8-22 mins                    G Codes:      Rebeca AlertJannie Nitish Roes, MPT Pager: [phone]

## 2016-09-05 NOTE — Progress Notes (Signed)
PROGRESS NOTE  Carrie Pena  ZOX:[id number] DOB: November 04, 1927 DOA: 09/01/2016 PCP: [name],[name], MD  Brief Narrative:   The patient is an [age] are old female with history of COPD, GERD, hypertension, peripheral arterial disease who presented to the emergency department with left leg pain and inability to bear weight after a fall at home the night prior. She was found to have no acute fracture or dislocation of her hips on x-ray but because she had such significant pain a CT of the pelvis and upper thigh of the left side was performed which suggested a hematoma in the distal iliopsoas muscle without tendon rupture. She was also wheezing and appeared Jeily Guthridge of breath. Chest x-ray demonstrated no evidence of focal infiltrate or edema. She was admitted for shortness of breath, therapy evaluations, and pain control.  Assessment & Plan:   Principal Problem:   Iliopsoas muscle hematoma, left, initial encounter Active Problems:   COPD with acute exacerbation (HCC)   Essential hypertension, benign   Intractable pain   Macrocytic anemia  Iliopsoas hematoma with intractable pain secondary to a fall at home -  Continue steroids as below for antiinflammatory properties -  Avoid NSAIDS due to concern for diastolic heart failure/fluid retention and worsening of hematoma -  Tylenol allergy -  Continue prn oxycodone > stool regimen below -  To SNF for rehab  AKI due to overdiuresis -  NS bolus and IVF -  D/c lasix and ARB -  Repeat BMP in afternoon:  Creatinine continuing to rise -  Continue IVF and repeat BMP in AM  Acute respiratory failure seconadry to acute COPD exacerbation and probable acute on chronic diastolic heart failure, improved -  Start prednisone  -  Continue duo nebs and azithromycin    4. Hypertension, elevated blood pressures - d/c losartan  -  Continue Norvasc  -  Continue when necessary hydralazine   5. Macrocytic anemia, possibly due to acute blood loss from  hematoma - Hgb appears stable at 9.8, but MCV is now 104.1 and was previously normal  - B12, folate, and iron studies within normal limits  6. PAD  - Pt complains of progressive worsening in her claudication  - Continue Pletal, ASA 81, statin   Constipation, had 1 BM yesterday -  continue miralax, senna -  Prn bisacodyl  DVT prophylaxis: SCDs  Code Status: DNR Family Communication: Discussed with patient alone  Disposition Plan:  anticipate discharge to SNF Tuesday if creatinine trending down  Consultants:   None  Procedures:  None  Antimicrobials:  Anti-infectives    Start     Dose/Rate Route Frequency Ordered Stop   09/03/16 2200  azithromycin (ZITHROMAX) tablet 500 mg     500 mg Oral Daily at bedtime 09/03/16 0906     09/01/16 2030  azithromycin (ZITHROMAX) 500 mg in dextrose 5 % 250 mL IVPB  Status:  Discontinued     500 mg 250 mL/hr over 60 Minutes Intravenous Every 24 hours 09/01/16 2012 09/03/16 0905          Subjective:  Still feels SOB, but feels less wheezy.  Persistent severe pain in the left lateral posterior hip.    Objective: Vitals:   09/04/16 2200 09/05/16 0628 09/05/16 0752 09/05/16 1349  BP: (!) 111/54 121/70  (!) 138/59  Pulse: 83 85  81  Resp: 16 15  16   Temp: 98.6 F (37 C) 98.2 F (36.8 C)  97.9 F (36.6 C)  TempSrc: Oral Oral  Axillary  SpO2: 90% 94% 95% 94%  Weight:      Height:        Intake/Output Summary (Last 24 hours) at 09/05/16 1645 Last data filed at 09/05/16 1452  Gross per 24 hour  Intake           1592.5 ml  Output              900 ml  Net            692.5 ml   Filed Weights   09/01/16 2315 09/02/16 0500 09/03/16 0600  Weight: 62.2 kg (137 lb 2 oz) 62.8 kg (138 lb 7.2 oz) 66.4 kg (146 lb 6.2 oz)    Examination:  General exam:  Adult Female. No acute distress HEENT:  NCAT, dry MM Respiratory system:  Improved aeration at bases, faint wheeze, no rales or rhonchi Cardiovascular system: Regular rate and  rhythm, normal S1/S2. No murmurs, rubs, gallops or clicks.  Warm extremities Gastrointestinal system: Normal active bowel sounds, soft, moderately distended, nontender. MSK:  Normal tone and bulk, no lower extremity edema.   Neuro:  Grossly is all extremities    Data Reviewed: I have personally reviewed following labs and imaging studies  CBC:  Recent Labs Lab 09/01/16 1133  WBC 6.4  NEUTROABS 4.1  HGB 9.8*  HCT 30.7*  MCV 104.1*  PLT 309   Basic Metabolic Panel:  Recent Labs Lab 09/01/16 1133 09/02/16 0459 09/05/16 0401 09/05/16 1257  NA 137 136 134* 138  K 3.8 3.6 5.1 4.0  CL 104 105 102 104  CO2 26 27 26 24   GLUCOSE 94 82 116* 130*  BUN 18 17 64* 65*  CREATININE 0.96 0.88 1.59* 1.66*  CALCIUM 8.5* 8.1* 8.7* 8.5*   GFR: Estimated Creatinine Clearance: 21.7 mL/min (by C-G formula based on SCr of 1.66 mg/dL (H)). Liver Function Tests: No results for input(s): AST, ALT, ALKPHOS, BILITOT, PROT, ALBUMIN in the last 168 hours. No results for input(s): LIPASE, AMYLASE in the last 168 hours. No results for input(s): AMMONIA in the last 168 hours. Coagulation Profile: No results for input(s): INR, PROTIME in the last 168 hours. Cardiac Enzymes:  Recent Labs Lab 09/01/16 1940  CKTOTAL 139   BNP (last 3 results) No results for input(s): PROBNP in the last 8760 hours. HbA1C: No results for input(s): HGBA1C in the last 72 hours. CBG:  Recent Labs Lab 09/02/16 0751 09/03/16 0734 09/04/16 0848 09/05/16 0729  GLUCAP 80 144* 138* 118*   Lipid Profile: No results for input(s): CHOL, HDL, LDLCALC, TRIG, CHOLHDL, LDLDIRECT in the last 72 hours. Thyroid Function Tests: No results for input(s): TSH, T4TOTAL, FREET4, T3FREE, THYROIDAB in the last 72 hours. Anemia Panel: No results for input(s): VITAMINB12, FOLATE, FERRITIN, TIBC, IRON, RETICCTPCT in the last 72 hours. Urine analysis:    Component Value Date/Time   COLORURINE AMBER (A) 09/01/2016 1234    APPEARANCEUR CLEAR 09/01/2016 1234   LABSPEC 1.024 09/01/2016 1234   PHURINE 6.0 09/01/2016 1234   GLUCOSEU NEGATIVE 09/01/2016 1234   HGBUR NEGATIVE 09/01/2016 1234   BILIRUBINUR SMALL (A) 09/01/2016 1234   KETONESUR NEGATIVE 09/01/2016 1234   PROTEINUR 30 (A) 09/01/2016 1234   UROBILINOGEN 0.2 11/08/2009 1426   NITRITE NEGATIVE 09/01/2016 1234   LEUKOCYTESUR NEGATIVE 09/01/2016 1234   Sepsis Labs: @LABRCNTIP (procalcitonin:4,lacticidven:4)  ) Recent Results (from the past 240 hour(s))  Urine culture     Status: None   Collection Time: 09/01/16 12:34 PM  Result Value Ref Range Status  Specimen Description URINE, RANDOM  Final   Special Requests NONE  Final   Culture NO GROWTH Performed at [facility]   Final   Report Status 09/02/2016 FINAL  Final      Radiology Studies: No results found.   Scheduled Meds: . aspirin  81 mg Oral Daily  . azithromycin  500 mg Oral QHS  . cilostazol  100 mg Oral BID  . dextromethorphan-guaiFENesin  1 tablet Oral BID  . enoxaparin (LOVENOX) injection  30 mg Subcutaneous QHS  . feeding supplement (ENSURE ENLIVE)  237 mL Oral BID BM  . mometasone-formoterol  2 puff Inhalation BID  . polyethylene glycol  17 g Oral Daily  . predniSONE  40 mg Oral Q breakfast  . protein supplement  2 scoop Oral BID  . senna  2 tablet Oral QHS  . simvastatin  40 mg Oral q1800   Continuous Infusions: . sodium chloride 150 mL/hr at 09/05/16 1227     LOS: 3 days    Time spent: 30 min    Renae Fickle, MD [facility] Pager [phone]  If 7PM-7AM, please contact night-coverage [email or web address] Password [email or web address] 09/05/2016, 4:45 PM

## 2016-09-05 NOTE — Clinical Social Work Note (Signed)
Patient has a [facility] once medically stable for discharge. Admissons paperwork will be completed with patient's dtr at bedside at [facility] today.   MSW remains available as needed.   EDD: 11/20 VS 11/21.  Derenda FennelBashira Montrice Montuori, MSW [phone] 09/05/2016 10:55 AM

## 2016-09-06 DIAGNOSIS — D638 Anemia in other chronic diseases classified elsewhere: Secondary | ICD-10-CM | POA: Diagnosis not present

## 2016-09-06 DIAGNOSIS — K5900 Constipation, unspecified: Secondary | ICD-10-CM | POA: Diagnosis not present

## 2016-09-06 DIAGNOSIS — R062 Wheezing: Secondary | ICD-10-CM | POA: Diagnosis not present

## 2016-09-06 DIAGNOSIS — N179 Acute kidney failure, unspecified: Secondary | ICD-10-CM

## 2016-09-06 DIAGNOSIS — Z8249 Family history of ischemic heart disease and other diseases of the circulatory system: Secondary | ICD-10-CM | POA: Diagnosis not present

## 2016-09-06 DIAGNOSIS — I739 Peripheral vascular disease, unspecified: Secondary | ICD-10-CM | POA: Diagnosis not present

## 2016-09-06 DIAGNOSIS — M79604 Pain in right leg: Secondary | ICD-10-CM | POA: Diagnosis not present

## 2016-09-06 DIAGNOSIS — I10 Essential (primary) hypertension: Secondary | ICD-10-CM | POA: Diagnosis not present

## 2016-09-06 DIAGNOSIS — D509 Iron deficiency anemia, unspecified: Secondary | ICD-10-CM | POA: Diagnosis not present

## 2016-09-06 DIAGNOSIS — M810 Age-related osteoporosis without current pathological fracture: Secondary | ICD-10-CM | POA: Diagnosis not present

## 2016-09-06 DIAGNOSIS — Z7982 Long term (current) use of aspirin: Secondary | ICD-10-CM | POA: Diagnosis not present

## 2016-09-06 DIAGNOSIS — S7012XD Contusion of left thigh, subsequent encounter: Secondary | ICD-10-CM | POA: Diagnosis not present

## 2016-09-06 DIAGNOSIS — R296 Repeated falls: Secondary | ICD-10-CM | POA: Diagnosis present

## 2016-09-06 DIAGNOSIS — I110 Hypertensive heart disease with heart failure: Secondary | ICD-10-CM | POA: Diagnosis present

## 2016-09-06 DIAGNOSIS — R278 Other lack of coordination: Secondary | ICD-10-CM | POA: Diagnosis not present

## 2016-09-06 DIAGNOSIS — I70213 Atherosclerosis of native arteries of extremities with intermittent claudication, bilateral legs: Secondary | ICD-10-CM | POA: Diagnosis not present

## 2016-09-06 DIAGNOSIS — R069 Unspecified abnormalities of breathing: Secondary | ICD-10-CM | POA: Diagnosis not present

## 2016-09-06 DIAGNOSIS — S7010XS Contusion of unspecified thigh, sequela: Secondary | ICD-10-CM | POA: Diagnosis not present

## 2016-09-06 DIAGNOSIS — I5032 Chronic diastolic (congestive) heart failure: Secondary | ICD-10-CM | POA: Diagnosis not present

## 2016-09-06 DIAGNOSIS — Z886 Allergy status to analgesic agent status: Secondary | ICD-10-CM | POA: Diagnosis not present

## 2016-09-06 DIAGNOSIS — D62 Acute posthemorrhagic anemia: Secondary | ICD-10-CM | POA: Diagnosis not present

## 2016-09-06 DIAGNOSIS — J9601 Acute respiratory failure with hypoxia: Secondary | ICD-10-CM | POA: Diagnosis not present

## 2016-09-06 DIAGNOSIS — R140 Abdominal distension (gaseous): Secondary | ICD-10-CM | POA: Diagnosis not present

## 2016-09-06 DIAGNOSIS — I5031 Acute diastolic (congestive) heart failure: Secondary | ICD-10-CM

## 2016-09-06 DIAGNOSIS — F0390 Unspecified dementia without behavioral disturbance: Secondary | ICD-10-CM | POA: Diagnosis present

## 2016-09-06 DIAGNOSIS — E440 Moderate protein-calorie malnutrition: Secondary | ICD-10-CM | POA: Diagnosis not present

## 2016-09-06 DIAGNOSIS — E785 Hyperlipidemia, unspecified: Secondary | ICD-10-CM | POA: Diagnosis not present

## 2016-09-06 DIAGNOSIS — M25552 Pain in left hip: Secondary | ICD-10-CM | POA: Diagnosis not present

## 2016-09-06 DIAGNOSIS — M25512 Pain in left shoulder: Secondary | ICD-10-CM | POA: Diagnosis not present

## 2016-09-06 DIAGNOSIS — Z7951 Long term (current) use of inhaled steroids: Secondary | ICD-10-CM | POA: Diagnosis not present

## 2016-09-06 DIAGNOSIS — J9811 Atelectasis: Secondary | ICD-10-CM | POA: Diagnosis present

## 2016-09-06 DIAGNOSIS — F411 Generalized anxiety disorder: Secondary | ICD-10-CM | POA: Diagnosis not present

## 2016-09-06 DIAGNOSIS — J441 Chronic obstructive pulmonary disease with (acute) exacerbation: Secondary | ICD-10-CM | POA: Diagnosis not present

## 2016-09-06 DIAGNOSIS — Z87891 Personal history of nicotine dependence: Secondary | ICD-10-CM | POA: Diagnosis not present

## 2016-09-06 DIAGNOSIS — D649 Anemia, unspecified: Secondary | ICD-10-CM | POA: Diagnosis not present

## 2016-09-06 DIAGNOSIS — I5033 Acute on chronic diastolic (congestive) heart failure: Secondary | ICD-10-CM | POA: Diagnosis present

## 2016-09-06 DIAGNOSIS — M25561 Pain in right knee: Secondary | ICD-10-CM | POA: Diagnosis not present

## 2016-09-06 DIAGNOSIS — Z993 Dependence on wheelchair: Secondary | ICD-10-CM | POA: Diagnosis not present

## 2016-09-06 DIAGNOSIS — Z9889 Other specified postprocedural states: Secondary | ICD-10-CM | POA: Diagnosis not present

## 2016-09-06 DIAGNOSIS — J449 Chronic obstructive pulmonary disease, unspecified: Secondary | ICD-10-CM | POA: Diagnosis not present

## 2016-09-06 DIAGNOSIS — R0600 Dyspnea, unspecified: Secondary | ICD-10-CM | POA: Diagnosis not present

## 2016-09-06 DIAGNOSIS — Z66 Do not resuscitate: Secondary | ICD-10-CM | POA: Diagnosis present

## 2016-09-06 DIAGNOSIS — D508 Other iron deficiency anemias: Secondary | ICD-10-CM | POA: Diagnosis not present

## 2016-09-06 DIAGNOSIS — Z9181 History of falling: Secondary | ICD-10-CM | POA: Diagnosis not present

## 2016-09-06 DIAGNOSIS — F4320 Adjustment disorder, unspecified: Secondary | ICD-10-CM | POA: Diagnosis not present

## 2016-09-06 DIAGNOSIS — R601 Generalized edema: Secondary | ICD-10-CM | POA: Diagnosis not present

## 2016-09-06 DIAGNOSIS — S7012XA Contusion of left thigh, initial encounter: Secondary | ICD-10-CM | POA: Diagnosis not present

## 2016-09-06 DIAGNOSIS — Z808 Family history of malignant neoplasm of other organs or systems: Secondary | ICD-10-CM | POA: Diagnosis not present

## 2016-09-06 DIAGNOSIS — R2689 Other abnormalities of gait and mobility: Secondary | ICD-10-CM | POA: Diagnosis not present

## 2016-09-06 DIAGNOSIS — M1990 Unspecified osteoarthritis, unspecified site: Secondary | ICD-10-CM | POA: Diagnosis present

## 2016-09-06 DIAGNOSIS — R0902 Hypoxemia: Secondary | ICD-10-CM | POA: Diagnosis not present

## 2016-09-06 DIAGNOSIS — I714 Abdominal aortic aneurysm, without rupture: Secondary | ICD-10-CM | POA: Diagnosis not present

## 2016-09-06 DIAGNOSIS — R531 Weakness: Secondary | ICD-10-CM | POA: Diagnosis not present

## 2016-09-06 DIAGNOSIS — Z79899 Other long term (current) drug therapy: Secondary | ICD-10-CM | POA: Diagnosis not present

## 2016-09-06 DIAGNOSIS — D539 Nutritional anemia, unspecified: Secondary | ICD-10-CM | POA: Diagnosis present

## 2016-09-06 DIAGNOSIS — R079 Chest pain, unspecified: Secondary | ICD-10-CM | POA: Diagnosis not present

## 2016-09-06 DIAGNOSIS — R2681 Unsteadiness on feet: Secondary | ICD-10-CM | POA: Diagnosis not present

## 2016-09-06 DIAGNOSIS — K219 Gastro-esophageal reflux disease without esophagitis: Secondary | ICD-10-CM | POA: Diagnosis present

## 2016-09-06 LAB — BASIC METABOLIC PANEL
Anion gap: 4 — ABNORMAL LOW (ref 5–15)
BUN: 48 mg/dL — ABNORMAL HIGH (ref 6–20)
CHLORIDE: 106 mmol/L (ref 101–111)
CO2: 23 mmol/L (ref 22–32)
CREATININE: 1.14 mg/dL — AB (ref 0.44–1.00)
Calcium: 8.3 mg/dL — ABNORMAL LOW (ref 8.9–10.3)
GFR calc non Af Amer: 42 mL/min — ABNORMAL LOW (ref >60)
GFR, EST AFRICAN AMERICAN: 49 mL/min — AB (ref >60)
Glucose, Bld: 130 mg/dL — ABNORMAL HIGH (ref 65–99)
Potassium: 4.9 mmol/L (ref 3.5–5.1)
Sodium: 133 mmol/L — ABNORMAL LOW (ref 135–145)

## 2016-09-06 LAB — GLUCOSE, CAPILLARY: Glucose-Capillary: 98 mg/dL (ref 65–99)

## 2016-09-06 MED ORDER — POLYETHYLENE GLYCOL 3350 17 G PO PACK: 17.0000 g | PACK | Freq: Every day | ORAL | 0 refills | Status: DC

## 2016-09-06 MED ORDER — MOMETASONE FURO-FORMOTEROL FUM 100-5 MCG/ACT IN AERO: 2.0000 | INHALATION_SPRAY | Freq: Two times a day (BID) | RESPIRATORY_TRACT | 0 refills | Status: DC

## 2016-09-06 MED ORDER — ENOXAPARIN SODIUM 40 MG/0.4ML ~~LOC~~ SOLN: 40.0000 mg | Freq: Every day | SUBCUTANEOUS | Status: DC

## 2016-09-06 MED ORDER — ENSURE ENLIVE PO LIQD: 237.0000 mL | Freq: Two times a day (BID) | ORAL | 0 refills | Status: DC

## 2016-09-06 MED ORDER — SENNA 8.6 MG PO TABS: 2.0000 | ORAL_TABLET | Freq: Every day | ORAL | 0 refills | Status: DC

## 2016-09-06 MED ORDER — OXYCODONE HCL 5 MG PO TABS: 5.0000 mg | ORAL_TABLET | ORAL | 0 refills | Status: DC | PRN

## 2016-09-06 MED ORDER — PREDNISONE 20 MG PO TABS: 40.0000 mg | ORAL_TABLET | Freq: Every day | ORAL | 0 refills | Status: DC

## 2016-09-06 NOTE — Consult Note (Signed)
   [facility]   09/06/2016  Marsh DollyJeanne R Ledwith 04/08/1928 [id number]    [facility] Care [facility] follow up. Chart reviewed. Noted discharge plan. Spoke with Ms. Vaquera at bedside to make aware that writer will request [facility] with [facility]. She is agreeable to this. Request made for [facility] to be assigned while Ms. [name] is [facility] and to then be assigned to [facility] RNCM as well when she eventually returns home. Ms. [name] expresses appreciation of visit.    Raiford NobleAtika Hall, MSN-Ed, RN,BSN [facility] Hospital Liaison [phone]

## 2016-09-06 NOTE — Progress Notes (Signed)
Report given to [place] [facility]

## 2016-09-06 NOTE — Discharge Summary (Signed)
Physician Discharge Summary  Carrie Pena ZOX:[id number] DOB: Nov 07, 1927 DOA: 09/01/2016  PCP: Carrie HeadingsMACKENZIE,[name], MD  Admit date: 09/01/2016 Discharge date: 09/06/2016  Admitted From: home  Disposition:  SNF, Carrie Pena  Recommendations for Outpatient Follow-up:  1. Follow up with PCP in 1-2 weeks 2. Please obtain BMP/CBC in one week and resume losartan if creatinine back to baseline and blood pressure still elevated 3. PT/OT 4. F/u with vascular surgery in 1-2 months 5. Steroid burst, continue for 3 more days, then stop   Discharge Condition:  Stable, improved CODE STATUS:  DNR  Diet recommendation:  regular   Brief/Interim Summary:  The patient is an [age] are old female with history of COPD, GERD, hypertension, peripheral arterial disease who presented to the emergency department with left leg pain and inability to bear weight after a fall at home the night prior. She was found to have no acute fracture or dislocation of her hips on x-ray but because she had such significant pain a CT of the pelvis and upper thigh of the left side was performed which suggested a hematoma in the distal iliopsoas muscle without tendon rupture. She was also wheezing and appeared Carrie Pena of breath. Chest x-ray demonstrated no evidence of focal infiltrate or edema. She was admitted for shortness of breath, therapy evaluations, and pain control.  She was treated for acute COPD exacerbation, but had persistent rales at the bases with dyspnea/increased WOB.  She was treated for acute diastolic heart failure with IV lasix but was overdiuresed and developed mild AKI that reversed in 24 hours with IVF.  She was breathing comfortably on room air on the date of discharge, but still having considerable pain in the left lower extremity limiting her mobility.  Continue PT/OT at SNF.    Discharge Diagnoses:  Principal Problem:   Iliopsoas muscle hematoma, left, initial encounter Active Problems:   COPD with acute  exacerbation (HCC)   Essential hypertension, benign   Intractable pain   Macrocytic anemia   Acute diastolic heart failure (HCC)   AKI (acute kidney injury) (HCC)  Iliopsoas hematoma with intractable pain secondary to a fall at home -  Weight bearing as tolerated -  Steroid taper may help some due to antiinflammatory properties -  Avoid NSAIDS due to concern for diastolic heart failure/fluid retention and worsening of hematoma -  Tylenol allergy -  Continue prn oxycodone > stool regimen below -  To SNF for ongoing physical and occupational therapy  AKI due to overdiuresis, creatinine trended up to 1.66 due to lasix, but trended back down to 1.14 mg/dl with IVF.  ARB has been held and can be resumed in a week if creatinine back to baseline.  Baseline creatinine is 0.9.    Acute respiratory failure seconadry to acute COPD exacerbation and probable acute on chronic diastolic heart failure, improved  - Received 2 days of solumedrol and then changed to prednisone  -  Started dulera -  Continue duo nebs  -  Completed 1.5gm of azithromycin  -  Diuresed well with lasix 40mg  IV -  Wt at time of discharge:149 lbs = 68kg  Hypertension, elevated blood pressures -  Continue Norvasc   Macrocytic anemia, possibly due to acute blood loss from hematoma - Hgb appears stable at 9.8, but MCV is now 104.1 and was previously normal  - B12, folate, and iron studies were within normal limits  PAD Pt complains of progressive worsening in her claudication  - Continue Pletal, ASA 81, statin  -  F/u with vascular surgery, Carrie Pena in 1-2 months  Constipation, resolved.  Continue stool softeners and laxatives with prn bisacodyl.  Osteoporosis, due for her boniva.  Not administered during hospitalization.  Discharge Instructions  Discharge Instructions    AMB Referral to [facility] Care Management    Complete by:  As directed    Please assign to [facility] RNCM for transition of care and [facility]  LCSW. Written consent obtained. Referral from MD office. Will need assist with transportation to MD appointments. Please assess for potential further community needs. Please see notes. Likely discharge over the weekend. Thanks. Carrie Noble, MSN-Ed, RN,BSN[facility] Care [facility] [phone]   Reason for consult:  Please assign to [facility] RNCM and [facility] LCSW   Expected date of contact:  1-3 days (reserved for hospital discharges)   Diet general    Complete by:  As directed    Increase activity slowly    Complete by:  As directed        Medication List    STOP taking these medications   b complex vitamins tablet   Cholecalciferol 1000 units tablet   cyclobenzaprine 5 MG tablet Commonly known as:  FLEXERIL   ibuprofen 200 MG tablet Commonly known as:  ADVIL,MOTRIN   losartan 50 MG tablet Commonly known as:  COZAAR   meclizine 25 MG tablet Commonly known as:  ANTIVERT   meloxicam 15 MG tablet Commonly known as:  MOBIC   traMADol 50 MG tablet Commonly known as:  ULTRAM   vitamin C 500 MG tablet Commonly known as:  ASCORBIC ACID     TAKE these medications   amLODipine 10 MG tablet Commonly known as:  NORVASC Take 1 tablet (10 mg total) by mouth daily.   aspirin 81 MG tablet Take 81 mg by mouth daily.   cilostazol 100 MG tablet Commonly known as:  PLETAL TAKE 1 TABLET TWICE DAILY.   feeding supplement (ENSURE ENLIVE) Liqd Take 237 mLs by mouth 2 (two) times daily between meals.   ibandronate 150 MG tablet Commonly known as:  BONIVA Take 150 mg by mouth every 30 (thirty) days. Take in the morning with a full glass of water, on an empty stomach, and do not take anything else by mouth or lie down for the next 30 min.   ipratropium-albuterol 0.5-2.5 (3) MG/3ML Soln Commonly known as:  DUONEB Take 3 mLs by nebulization every 4 (four) hours as needed. What changed:  reasons to take this   mometasone-formoterol 100-5 MCG/ACT Aero Commonly  known as:  DULERA Inhale 2 puffs into the lungs 2 (two) times daily.   oxyCODONE 5 MG immediate release tablet Commonly known as:  Oxy IR/ROXICODONE Take 1 tablet (5 mg total) by mouth every 4 (four) hours as needed for moderate pain or severe pain.   polyethylene glycol packet Commonly known as:  MIRALAX / GLYCOLAX Take 17 g by mouth daily.   predniSONE 20 MG tablet Commonly known as:  DELTASONE Take 2 tablets (40 mg total) by mouth daily with breakfast. Start taking on:  09/07/2016   PROCEL Powd Take 2 scoop by mouth 2 (two) times daily.   senna 8.6 MG Tabs tablet Commonly known as:  SENOKOT Take 2 tablets (17.2 mg total) by mouth at bedtime.   simvastatin 40 MG tablet Commonly known as:  ZOCOR Take 40 mg by mouth daily at 6 PM.       Contact information for follow-up providers    [facility] Follow  up.   Contact information: [address] [place] [address] [phone]       Carrie Headings, MD Follow up.   Specialty:  Internal Medicine Contact information: [address] [place] [address] [phone]        Fabienne Bruns, MD. Schedule an appointment as soon as possible for a visit in 1 month(s).   Specialties:  Vascular Surgery, Cardiology Contact information: [address] [place] [address] [phone]            Contact information for after-discharge care    Destination    [facility] Follow up.   Specialty:  Skilled Nursing Facility Contact information: [address] [place] [address] [phone]                 Allergies  Allergen Reactions  . Tylenol [Acetaminophen] Other (See Comments)    Pt states "It makes me shake"    Consultations: none   Procedures/Studies: Dg Chest 2 View  Result Date: 09/01/2016 CLINICAL DATA:  Shortness of breath and cough for 1 day. EXAM: CHEST  2 VIEW COMPARISON:  08/14/2015 FINDINGS: The  cardiac silhouette, mediastinal and hilar contours are within normal limits and stable. Stable tortuosity and calcification of the thoracic aorta. Chronic lung changes but no acute pulmonary findings. No pleural effusion or pneumothorax. Stable advanced degenerative changes involving both shoulders. IMPRESSION: No acute cardiopulmonary findings. Electronically Signed   By: Rudie Meyer M.D.   On: 09/01/2016 20:21   Ct Hip Left Wo Contrast  Result Date: 09/01/2016 CLINICAL DATA:  Fall with left hip pain last night. Difficulty bearing weight. EXAM: CT OF THE LEFT HIP WITHOUT CONTRAST TECHNIQUE: Multidetector CT imaging of the left hip was performed according to the standard protocol. Multiplanar CT image reconstructions were also generated. COMPARISON:  09/01/2016 FINDINGS: Bones/Joint/Cartilage Mild to moderate degenerative chondral thinning in the left hip without visible fracture or hip effusion. Ligaments Suboptimally assessed by CT. Muscles and Tendons Mild thickening and increased density distally in the iliopsoas musculotendinous junction compatible with tear or hematoma. I do not see well-defined discontinuity in the distal iliopsoas tendon. Soft tissues Left groin hernia contains a loop of small bowel without strangulation. IMPRESSION: 1. Abnormal high density in the distal iliopsoas muscle and tendon, suspicious for hematoma, less likely tendon rupture. 2. Left groin hernia containing a loop of small bowel without strangulation or obstruction. 3. Mild to moderate degenerative chondral thinning in the left hip. Electronically Signed   By: Gaylyn Rong M.D.   On: 09/01/2016 12:43   Dg Knee Complete 4 Views Left  Result Date: 08/26/2016 CLINICAL DATA:  Lateral left knee pain and swelling for 2 days EXAM: LEFT KNEE - COMPLETE 4+ VIEW COMPARISON:  None. FINDINGS: No acute fracture. No dislocation. Unremarkable soft tissues. Osteopenia. A moderate narrowing and juxta-articular sclerosis of the  lateral compartment. IMPRESSION: No acute bony pathology.  Chronic changes. Electronically Signed   By: Jolaine Click M.D.   On: 08/26/2016 14:54   Dg Hip Unilat W Or Wo Pelvis 2-3 Views Left  Result Date: 09/01/2016 CLINICAL DATA:  Pain following fall EXAM: DG HIP (WITH OR WITHOUT PELVIS) 2-3V LEFT COMPARISON:  None. FINDINGS: Frontal pelvis as well as frontal and lateral left hip images were obtained. No evident fracture or dislocation. There is mild symmetric narrowing of both hip joints. No erosive change. There are foci of calcification in both common femoral and superficial femoral arteries. IMPRESSION: Mild symmetric narrowing  of both hip joints. No fracture dislocation. Multifocal arterial atherosclerosis. Electronically Signed   By: Bretta Bang III M.D.   On: 09/01/2016 11:40    Subjective: Always initially states that she is not doing well, but then stated that she is breathing better, she had a large BM and is no longer feeling constipated.  Left anterior hip is still hurting.    Discharge Exam: Vitals:   09/05/16 2100 09/06/16 0626  BP: 137/75 (!) 153/68  Pulse: 88 79  Resp: 16 16  Temp: 97.9 F (36.6 C) 97.7 F (36.5 C)   Vitals:   09/05/16 2100 09/06/16 0500 09/06/16 0626 09/06/16 1008  BP: 137/75  (!) 153/68   Pulse: 88  79   Resp: 16  16   Temp: 97.9 F (36.6 C)  97.7 F (36.5 C)   TempSrc: Oral  Oral   SpO2: 94%  95% 94%  Weight:  68 kg (149 lb 14.6 oz)    Height:       General exam:  Adult Female. No acute distress HEENT:  NCAT, MMM Respiratory system:  Improved aeration at bases,  CTAB, no wheezes or rales Cardiovascular system: Regular rate and rhythm, normal S1/S2. No murmurs, rubs, gallops or clicks.  Warm extremities Gastrointestinal system: Normal active bowel sounds, soft, moderately distended, nontender. MSK:  Normal tone and bulk, no lower extremity edema.   Neuro:  Grossly is all extremities    The results of significant diagnostics from  this hospitalization (including imaging, microbiology, ancillary and laboratory) are listed below for reference.     Microbiology: Recent Results (from the past 240 hour(s))  Urine culture     Status: None   Collection Time: 09/01/16 12:34 PM  Result Value Ref Range Status   Specimen Description URINE, RANDOM  Final   Special Requests NONE  Final   Culture NO GROWTH Performed at [facility]   Final   Report Status 09/02/2016 FINAL  Final     Labs: BNP (last 3 results)  Recent Labs  09/03/16 1358  BNP 82.2   Basic Metabolic Panel:  Recent Labs Lab 09/01/16 1133 09/02/16 0459 09/05/16 0401 09/05/16 1257 09/06/16 0444  NA 137 136 134* 138 133*  K 3.8 3.6 5.1 4.0 4.9  CL 104 105 102 104 106  CO2 26 27 26 24 23   GLUCOSE 94 82 116* 130* 130*  BUN 18 17 64* 65* 48*  CREATININE 0.96 0.88 1.59* 1.66* 1.14*  CALCIUM 8.5* 8.1* 8.7* 8.5* 8.3*   Liver Function Tests: No results for input(s): AST, ALT, ALKPHOS, BILITOT, PROT, ALBUMIN in the last 168 hours. No results for input(s): LIPASE, AMYLASE in the last 168 hours. No results for input(s): AMMONIA in the last 168 hours. CBC:  Recent Labs Lab 09/01/16 1133  WBC 6.4  NEUTROABS 4.1  HGB 9.8*  HCT 30.7*  MCV 104.1*  PLT 309   Cardiac Enzymes:  Recent Labs Lab 09/01/16 1940  CKTOTAL 139   BNP: Invalid input(s): POCBNP CBG:  Recent Labs Lab 09/02/16 0751 09/03/16 0734 09/04/16 0848 09/05/16 0729 09/06/16 0735  GLUCAP 80 144* 138* 118* 98   D-Dimer No results for input(s): DDIMER in the last 72 hours. Hgb A1c No results for input(s): HGBA1C in the last 72 hours. Lipid Profile No results for input(s): CHOL, HDL, LDLCALC, TRIG, CHOLHDL, LDLDIRECT in the last 72 hours. Thyroid function studies No results for input(s): TSH, T4TOTAL, T3FREE, THYROIDAB in the last 72 hours.  Invalid input(s): FREET3 Anemia  work up No results for input(s): VITAMINB12, FOLATE, FERRITIN, TIBC, IRON, RETICCTPCT in  the last 72 hours. Urinalysis    Component Value Date/Time   COLORURINE AMBER (A) 09/01/2016 1234   APPEARANCEUR CLEAR 09/01/2016 1234   LABSPEC 1.024 09/01/2016 1234   PHURINE 6.0 09/01/2016 1234   GLUCOSEU NEGATIVE 09/01/2016 1234   HGBUR NEGATIVE 09/01/2016 1234   BILIRUBINUR SMALL (A) 09/01/2016 1234   KETONESUR NEGATIVE 09/01/2016 1234   PROTEINUR 30 (A) 09/01/2016 1234   UROBILINOGEN 0.2 11/08/2009 1426   NITRITE NEGATIVE 09/01/2016 1234   LEUKOCYTESUR NEGATIVE 09/01/2016 1234   Sepsis Labs Invalid input(s): PROCALCITONIN,  WBC,  LACTICIDVEN   Time coordinating discharge: Over 30 minutes  SIGNED:   Renae FickleSHORT, [place] Hamblen, MD  [facility] 09/06/2016, 11:16 AM Pager   If 7PM-7AM, please contact night-coverage [email or web address] Password [email or web address]

## 2016-09-06 NOTE — Progress Notes (Signed)
PT Cancellation Note  Patient Details Name: Carrie Pena MRN: [id number] DOB: 1928/04/05   Cancelled Treatment:     Patient was eating lunch upon arrival. Plans to D/C to SNF today.   McCrackenaitlin Medlin, [place] [facility] Acute Rehab [phone]  Felecia ShellingLori Davidlee Jeanbaptiste  PTA [facility]  Ac[facility]  [facility] Pager      [phone]

## 2016-09-06 NOTE — Clinical Social Work Placement (Signed)
Medical Social Worker facilitated patient discharge including contacting patient family and facility to confirm patient discharge plans.  Clinical information faxed to facility and family agreeable with plan.  MSW arranged ambulance transport via [facility] to [facility].  RN to call report prior to discharge.  Medical Social Worker will sign off for now as social work intervention is no longer needed. Please consult us again if new need arises.  CLINICAL SOCIAL WORK PLACEMENT  NOTE  Date:  09/06/2016  Patient Details  Name: Carrie Pena MRN: [id number] Date of Birth: December 09, 1927  Clinical Social Work is seeking post-discharge placement for this patient at the Skilled  Nursing Facility level of care (*CSW will initial, date and re-position this form in  chart as items are completed):  Yes   Patient/family provided with [place] Clinical Social Work Department's list of facilities offering this level of care within the geographic area requested by the patient (or if unable, by the patient's family).  Yes   Patient/family informed of their freedom to choose among providers that offer the needed level of care, that participate in Medicare, Medicaid or managed care program needed by the patient, have an available bed and are willing to accept the patient.  Yes   Patient/family informed of [facility]'s ownership interest in [facility] and [facility], as well as of the fact that they are under no obligation to receive care at these facilities.  PASRR submitted to EDS on       PASRR number received on       Existing PASRR number confirmed on 09/03/16     FL2 transmitted to all facilities in geographic area requested by pt/family on 09/03/16     FL2 transmitted to all facilities within larger geographic area on       Patient informed that his/her managed care company has contracts with or will negotiate with certain facilities, including the following:         Yes   Patient/family informed of bed offers received.  Patient chooses bed at  [facility] )     Physician recommends and patient chooses bed at      Patient to be transferred to  [facility]) on 09/06/16.  Patient to be transferred to facility by  [facility] )     Patient family notified on 09/06/16 of transfer.  Name of family member notified:   (Pt's dtr, Pattricia BossAnnie )     PHYSICIAN Please prepare priority discharge summary, including medications, Please prepare prescriptions, Please sign FL2, Please sign DNR     Additional Comment:    _______________________________________________ Derenda FennelNixon, Ghadeer Kastelic A 09/06/2016, 11:48 AM

## 2016-09-06 NOTE — Progress Notes (Signed)
Attempted to call [facility] x 2 to give report, no anawer

## 2016-09-07 ENCOUNTER — Non-Acute Institutional Stay (SKILLED_NURSING_FACILITY): Payer: Medicare Other | Admitting: Adult Health

## 2016-09-07 ENCOUNTER — Encounter: Payer: Self-pay | Admitting: Adult Health

## 2016-09-07 ENCOUNTER — Encounter: Payer: Self-pay | Admitting: *Deleted

## 2016-09-07 DIAGNOSIS — E785 Hyperlipidemia, unspecified: Secondary | ICD-10-CM | POA: Diagnosis not present

## 2016-09-07 DIAGNOSIS — I10 Essential (primary) hypertension: Secondary | ICD-10-CM

## 2016-09-07 DIAGNOSIS — J441 Chronic obstructive pulmonary disease with (acute) exacerbation: Secondary | ICD-10-CM | POA: Diagnosis not present

## 2016-09-07 DIAGNOSIS — I739 Peripheral vascular disease, unspecified: Secondary | ICD-10-CM

## 2016-09-07 DIAGNOSIS — D638 Anemia in other chronic diseases classified elsewhere: Secondary | ICD-10-CM | POA: Diagnosis not present

## 2016-09-07 DIAGNOSIS — M810 Age-related osteoporosis without current pathological fracture: Secondary | ICD-10-CM | POA: Diagnosis not present

## 2016-09-07 DIAGNOSIS — M79604 Pain in right leg: Secondary | ICD-10-CM | POA: Diagnosis not present

## 2016-09-07 NOTE — Progress Notes (Signed)
DATE:  09/07/2016   MRN:  [id number]  BIRTHDAY: [birth year]  Facility:  Nursing Home Location:  [facility]  Nursing Home Room Number: 1201-P  LEVEL OF CARE:  SNF (31)  Contact Information    Name Relation Home Work Mobile   TananaEzernack,Annie (Second Contact) Daughter [phone]  [phone]   Parthenia AmesCaretaker,Kelly Other [phone]     Rushie Nyhanzernack,[name] (Hcpoa) Grandaughter   [phone]       Code Status History    Date Active Date Inactive Code Status Order ID Comments User Context   09/01/2016  8:12 PM 09/06/2016  5:52 PM DNR [id number]  Briscoe Deutscherimothy S Opyd, MD ED   08/12/2015  7:07 PM 08/15/2015  5:20 PM Full Code [id number]  Jeralyn BennettEzequiel Zamora, MD Inpatient    Questions for Most Recent Historical Code Status (Order [id number])    Question Answer Comment   In the event of cardiac or respiratory ARREST Do not call a "code blue"    In the event of cardiac or respiratory ARREST Do not perform Intubation, CPR, defibrillation or ACLS    In the event of cardiac or respiratory ARREST Use medication by any route, position, wound care, and other measures to relive pain and suffering. May use oxygen, suction and manual treatment of airway obstruction as needed for comfort.         Advance Directive Documentation   Flowsheet Row Most Recent Value  Type of Advance Directive  Out of facility DNR (pink MOST or yellow form)  Pre-existing out of facility DNR order (yellow form or pink MOST form)  No data  "MOST" Form in Place?  No data       Chief Complaint  Patient presents with  . Hospitalization Follow-up    HISTORY OF PRESENT ILLNESS:  This is an 82 year old female who has [facility] 09/01/16 through 09/05/16. She has PMH of COPD, GERD, hypertension and peripheral arterial disease. She had a fall at home and started having pain on her left leg. She was found to have no acute fracture or dislocation  of her hips on x-ray. A CT of the pelvis and upper thigh of the left side was performed which suggested a hematoma in the distal iliopsoas muscle without tendon rupture. Chest x-ray demonstrated no evidence of focal infiltrate or edema. She was treated for acute COPD exacerbation. She was also treated for acute diastolic heart failure with IV Lasix and developed mild AKI due to being over diuresed. She was given IVF.  She has been admitted for a short-term rehabilitation.  PAST MEDICAL HISTORY:  Past Medical History:  Diagnosis Date  . Carpal tunnel syndrome   . COPD (chronic obstructive pulmonary disease) (HCC)   . GERD (gastroesophageal reflux disease)   . Hyperlipidemia   . Hypertension   . Osteoarthritis      CURRENT MEDICATIONS: Reviewed  Patient's Medications  New Prescriptions   No medications on file  Previous Medications   AMLODIPINE (NORVASC) 10 MG TABLET    Take 1 tablet (10 mg total) by mouth daily.   ASPIRIN 81 MG TABLET    Take 81 mg by mouth daily.     CILOSTAZOL (PLETAL) 100 MG TABLET    TAKE 1 TABLET TWICE DAILY.   IBANDRONATE (BONIVA) 150 MG TABLET    Take 150 mg by mouth every 30 (thirty) days. Take in the morning with a full glass of water, on an empty stomach, and do not  take anything else by mouth or lie down for the next 30 min.   IPRATROPIUM-ALBUTEROL (DUONEB) 0.5-2.5 (3) MG/3ML SOLN    Take 3 mLs by nebulization every 4 (four) hours as needed.   MOMETASONE-FORMOTEROL (DULERA) 100-5 MCG/ACT AERO    Inhale 2 puffs into the lungs 2 (two) times daily.   NUTRITIONAL SUPPLEMENT LIQD    Take 120 mLs by mouth 2 (two) times daily.   OXYCODONE (OXY IR/ROXICODONE) 5 MG IMMEDIATE RELEASE TABLET    Take 1 tablet (5 mg total) by mouth every 4 (four) hours as needed for moderate pain or severe pain.   POLYETHYLENE GLYCOL (MIRALAX / GLYCOLAX) PACKET    Take 17 g by mouth daily.   PREDNISONE (DELTASONE) 20 MG TABLET    Take 2 tablets (40 mg total) by mouth daily with breakfast.     PROTEIN (PROCEL) POWD    Take 2 scoop by mouth 2 (two) times daily.   SENNA (SENOKOT) 8.6 MG TABS TABLET    Take 2 tablets (17.2 mg total) by mouth at bedtime.   SIMVASTATIN (ZOCOR) 40 MG TABLET    Take 40 mg by mouth daily at 6 PM.  Modified Medications   No medications on file  Discontinued Medications   FEEDING SUPPLEMENT, ENSURE ENLIVE, (ENSURE ENLIVE) LIQD    Take 237 mLs by mouth 2 (two) times daily between meals.     Allergies  Allergen Reactions  . Tylenol [Acetaminophen] Other (See Comments)    Pt states "It makes me shake"     REVIEW OF SYSTEMS:  GENERAL: no change in appetite, no fatigue, no weight changes, no fever, chills or weakness EYES: Denies change in vision, dry eyes, eye pain, itching or discharge EARS: Denies change in hearing, ringing in ears, or earache NOSE: Denies nasal congestion or epistaxis MOUTH and THROAT: Denies oral discomfort, gingival pain or bleeding, pain from teeth or hoarseness   RESPIRATORY: no cough, SOB, DOE, wheezing, hemoptysis CARDIAC: no chest pain, edema or palpitations GI: no abdominal pain, diarrhea, constipation, heart burn, nausea or vomiting GU: Denies dysuria, frequency, hematuria, incontinence, or discharge PSYCHIATRIC: Denies feeling of depression or anxiety. No report of hallucinations, insomnia, paranoia, or agitation    PHYSICAL EXAMINATION  GENERAL APPEARANCE: Well nourished. In no acute distress. Normal body habitus SKIN:  Skin is warm and dry. There are no suspicious lesions or rash HEAD: Normal in size and contour. No evidence of trauma EYES: Lids open and close normally. No blepharitis, entropion or ectropion. PERRL. Conjunctivae are clear and sclerae are white. Lenses are without opacity EARS: Pinnae are normal. Patient hears normal voice tunes of the examiner MOUTH and THROAT: Lips are without lesions. Oral mucosa is moist and without lesions. Tongue is normal in shape, size, and color and without lesions NECK:  supple, trachea midline, no neck masses, no thyroid tenderness, no thyromegaly LYMPHATICS: no LAN in the neck, no supraclavicular LAN RESPIRATORY: breathing is even & unlabored, BS CTAB CARDIAC: RRR, no murmur,no extra heart sounds, no edema GI: abdomen soft, normal BS, no masses, no tenderness, no hepatomegaly, no splenomegaly EXTREMITIES:  Able to move 4 extremities, LLE has generalized weakness PSYCHIATRIC: Alert and oriented X 3. Affect and behavior are appropriate  LABS/RADIOLOGY: Labs reviewed: Basic Metabolic Panel:  Recent Labs  09/81/1911/20/17 0401 09/05/16 1257 09/06/16 0444  NA 134* 138 133*  K 5.1 4.0 4.9  CL 102 104 106  CO2 26 24 23   GLUCOSE 116* 130* 130*  BUN 64* 65* 48*  CREATININE 1.59* 1.66* 1.14*  CALCIUM 8.7* 8.5* 8.3*   CBC:  Recent Labs  09/01/16 1133  WBC 6.4  NEUTROABS 4.1  HGB 9.8*  HCT 30.7*  MCV 104.1*  PLT 309    Cardiac Enzymes:  Recent Labs  09/01/16 1940  CKTOTAL 139   CBG:  Recent Labs  09/04/16 0848 09/05/16 0729 09/06/16 0735  GLUCAP 138* 118* 98      Dg Chest 2 View  Result Date: 09/01/2016 CLINICAL DATA:  Shortness of breath and cough for 1 day. EXAM: CHEST  2 VIEW COMPARISON:  08/14/2015 FINDINGS: The cardiac silhouette, mediastinal and hilar contours are within normal limits and stable. Stable tortuosity and calcification of the thoracic aorta. Chronic lung changes but no acute pulmonary findings. No pleural effusion or pneumothorax. Stable advanced degenerative changes involving both shoulders. IMPRESSION: No acute cardiopulmonary findings. Electronically Signed   By: Rudie Meyer M.D.   On: 09/01/2016 20:21   Ct Hip Left Wo Contrast  Result Date: 09/01/2016 CLINICAL DATA:  Fall with left hip pain last night. Difficulty bearing weight. EXAM: CT OF THE LEFT HIP WITHOUT CONTRAST TECHNIQUE: Multidetector CT imaging of the left hip was performed according to the standard protocol. Multiplanar CT image reconstructions  were also generated. COMPARISON:  09/01/2016 FINDINGS: Bones/Joint/Cartilage Mild to moderate degenerative chondral thinning in the left hip without visible fracture or hip effusion. Ligaments Suboptimally assessed by CT. Muscles and Tendons Mild thickening and increased density distally in the iliopsoas musculotendinous junction compatible with tear or hematoma. I do not see well-defined discontinuity in the distal iliopsoas tendon. Soft tissues Left groin hernia contains a loop of small bowel without strangulation. IMPRESSION: 1. Abnormal high density in the distal iliopsoas muscle and tendon, suspicious for hematoma, less likely tendon rupture. 2. Left groin hernia containing a loop of small bowel without strangulation or obstruction. 3. Mild to moderate degenerative chondral thinning in the left hip. Electronically Signed   By: Gaylyn Rong M.D.   On: 09/01/2016 12:43   Dg Knee Complete 4 Views Left  Result Date: 08/26/2016 CLINICAL DATA:  Lateral left knee pain and swelling for 2 days EXAM: LEFT KNEE - COMPLETE 4+ VIEW COMPARISON:  None. FINDINGS: No acute fracture. No dislocation. Unremarkable soft tissues. Osteopenia. A moderate narrowing and juxta-articular sclerosis of the lateral compartment. IMPRESSION: No acute bony pathology.  Chronic changes. Electronically Signed   By: Jolaine Click M.D.   On: 08/26/2016 14:54   Dg Hip Unilat W Or Wo Pelvis 2-3 Views Left  Result Date: 09/01/2016 CLINICAL DATA:  Pain following fall EXAM: DG HIP (WITH OR WITHOUT PELVIS) 2-3V LEFT COMPARISON:  None. FINDINGS: Frontal pelvis as well as frontal and lateral left hip images were obtained. No evident fracture or dislocation. There is mild symmetric narrowing of both hip joints. No erosive change. There are foci of calcification in both common femoral and superficial femoral arteries. IMPRESSION: Mild symmetric narrowing of both hip joints. No fracture dislocation. Multifocal arterial atherosclerosis.  Electronically Signed   By: Bretta Bang III M.D.   On: 09/01/2016 11:40    ASSESSMENT/PLAN:  Iliopsoas hematoma with intractable pain; weightbearing as tolerated; continue tapering dose of prednisone, oxycodone 5 mg 1 tab by mouth every 4 hours when necessary for pain; PMR consult  Osteoporosis - continue Boniva 150 mg 1 tab by mouth every 30 days  Hypertension - continue amlodipine besylate 10 mg 1 tab by mouth daily; chec BMP  Hyperlipidemia - continue simvastatin 40 mg 1 tab by  mouth every 6 p.m.  Acute exacerbation of COPD - no SOB nor wheezing; continue DuoNeb nebulization every 4 hours when necessary, Dulera 100 g/5 g inhaler 2 puffs into lungs twice a day  PAD - continue cilostazol 100 mg 1 tab by mouth twice a day  Anemia of chronic disease - re-check CBC Lab Results  Component Value Date   HGB 9.8 (L) 09/01/2016       Goals of care:  Short-term rehabilitation    Kenard Gower - NP [facility] [phone]

## 2016-09-12 DIAGNOSIS — R2681 Unsteadiness on feet: Secondary | ICD-10-CM | POA: Diagnosis not present

## 2016-09-12 DIAGNOSIS — M25552 Pain in left hip: Secondary | ICD-10-CM | POA: Diagnosis not present

## 2016-09-12 DIAGNOSIS — M25561 Pain in right knee: Secondary | ICD-10-CM | POA: Diagnosis not present

## 2016-09-12 DIAGNOSIS — I739 Peripheral vascular disease, unspecified: Secondary | ICD-10-CM | POA: Diagnosis not present

## 2016-09-12 DIAGNOSIS — I70213 Atherosclerosis of native arteries of extremities with intermittent claudication, bilateral legs: Secondary | ICD-10-CM | POA: Diagnosis not present

## 2016-09-12 DIAGNOSIS — M25512 Pain in left shoulder: Secondary | ICD-10-CM | POA: Diagnosis not present

## 2016-09-12 LAB — BASIC METABOLIC PANEL
BUN: 26 mg/dL — AB (ref 4–21)
CREATININE: 0.9 mg/dL (ref 0.5–1.1)
Glucose: 90 mg/dL
POTASSIUM: 4.7 mmol/L (ref 3.4–5.3)
Sodium: 137 mmol/L (ref 137–147)

## 2016-09-12 LAB — CBC AND DIFFERENTIAL
HCT: 29 % — AB (ref 36–46)
Hemoglobin: 9.5 g/dL — AB (ref 12.0–16.0)
PLATELETS: 331 10*3/uL (ref 150–399)
WBC: 8.4 10*3/mL

## 2016-09-13 ENCOUNTER — Encounter: Payer: Self-pay | Admitting: Internal Medicine

## 2016-09-13 ENCOUNTER — Non-Acute Institutional Stay (SKILLED_NURSING_FACILITY): Payer: Medicare Other | Admitting: Internal Medicine

## 2016-09-13 DIAGNOSIS — J441 Chronic obstructive pulmonary disease with (acute) exacerbation: Secondary | ICD-10-CM | POA: Diagnosis not present

## 2016-09-13 DIAGNOSIS — I739 Peripheral vascular disease, unspecified: Secondary | ICD-10-CM

## 2016-09-13 DIAGNOSIS — M810 Age-related osteoporosis without current pathological fracture: Secondary | ICD-10-CM | POA: Diagnosis not present

## 2016-09-13 DIAGNOSIS — I10 Essential (primary) hypertension: Secondary | ICD-10-CM

## 2016-09-13 DIAGNOSIS — R2681 Unsteadiness on feet: Secondary | ICD-10-CM | POA: Diagnosis not present

## 2016-09-13 DIAGNOSIS — D62 Acute posthemorrhagic anemia: Secondary | ICD-10-CM

## 2016-09-13 DIAGNOSIS — I70213 Atherosclerosis of native arteries of extremities with intermittent claudication, bilateral legs: Secondary | ICD-10-CM | POA: Diagnosis not present

## 2016-09-13 DIAGNOSIS — R531 Weakness: Secondary | ICD-10-CM

## 2016-09-13 DIAGNOSIS — M25561 Pain in right knee: Secondary | ICD-10-CM | POA: Diagnosis not present

## 2016-09-13 DIAGNOSIS — S7010XS Contusion of unspecified thigh, sequela: Secondary | ICD-10-CM | POA: Diagnosis not present

## 2016-09-13 DIAGNOSIS — K5900 Constipation, unspecified: Secondary | ICD-10-CM

## 2016-09-13 DIAGNOSIS — M25512 Pain in left shoulder: Secondary | ICD-10-CM | POA: Diagnosis not present

## 2016-09-13 DIAGNOSIS — M25552 Pain in left hip: Secondary | ICD-10-CM | POA: Diagnosis not present

## 2016-09-13 DIAGNOSIS — E440 Moderate protein-calorie malnutrition: Secondary | ICD-10-CM

## 2016-09-13 NOTE — Progress Notes (Signed)
LOCATION: [facility]  PCP: [name],[name], MD   Code Status: DNR  Goals of care: Advanced Directive information Advanced Directives 09/07/2016  Does Patient Have a Medical Advance Directive? Yes  Type of Advance Directive Out of facility DNR (pink MOST or yellow form)  Does patient want to make changes to medical advance directive? No - Patient declined  Copy of Healthcare Power of Attorney in Chart? -  Would patient like information on creating a medical advance directive? No - Patient declined       Extended Emergency Contact Information Primary Emergency Contact: Berna BueEzernack,Annie [facility](Second Contact) Address: [address]          [place], [place] [id number] [place] Home Phone: [phone] Mobile Phone: [phone] Relation: Daughter Secondary Emergency Contact: Romona Curlsaretaker,Kelly  [place] Home Phone: [phone] Relation: Other   Allergies  Allergen Reactions  . Tylenol [Acetaminophen] Other (See Comments)    Pt states "It makes me shake"    Chief Complaint  Patient presents with  . New Admit To SNF    New Admission Visit     HPI:  Patient is a 82 y.o. female seen today for short term rehabilitation post hospital admission from 16th of November 2017-09/06/2016 with acute exacerbation of COPD, acute kidney injury, acute respiratory failure and iliopsoas hematoma. She had a fall leading to left leg pain. Acute fractures were ruled out. CAT scan of her pelvis and upper thigh revealed hematoma. She required Solu-Medrol, IV diuretics, IV fluids, IV antibiotics and bronchodilators. She has medical history of COPD, hypertension, peripheral arterial disease, gastroesophageal reflux disease among others. She is seen in her room today.  Review of Systems:  Constitutional: Negative for fever, chills, diaphoresis.  positive for generalized weakness HENT: Negative for headache, congestion, nasal discharge Eyes: Negative for blurred vision, double  vision and discharge.  Respiratory: Negative for cough, shortness of breath and wheezing.   positive for occasional runny nose. Cardiovascular: Negative for chest pain, palpitations, leg swelling.  Gastrointestinal: Negative for heartburn, nausea, vomiting, abdominal pain. Last bowel movement was 2 days back. This is normal for her at home.  Genitourinary: Negative for dysuria and flank pain.  Musculoskeletal: Negative for back pain, fall.  Skin: Negative for itching, rash.  Neurological: Negative for dizziness. Psychiatric/Behavioral: Negative for depression   Past Medical History:  Diagnosis Date  . Carpal tunnel syndrome   . COPD (chronic obstructive pulmonary disease) (HCC)   . GERD (gastroesophageal reflux disease)   . Hyperlipidemia   . Hypertension   . Osteoarthritis    Past Surgical History:  Procedure Laterality Date  . KNEE ARTHROSCOPY    . left carpal tunnel    . TONSILLECTOMY     Social History:   reports that she quit smoking about 18 months ago. Her smoking use included Cigarettes. She smoked 0.50 packs per day. She has never used smokeless tobacco. She reports that she does not drink alcohol or use drugs.  Family History  Problem Relation Age of Onset  . Cancer Mother   . Cancer Father   . Hypertension Daughter   . Heart disease Daughter     before age 80    Medications:   Medication List       Accurate as of 09/13/16  3:44 PM. Always use your most recent med list.          alendronate 70 MG tablet Commonly known as:  FOSAMAX Take 70 mg by mouth once a week. Take with  a full glass of water on an empty stomach.   amLODipine 10 MG tablet Commonly known as:  NORVASC Take 1 tablet (10 mg total) by mouth daily.   aspirin 81 MG tablet Take 81 mg by mouth daily.   budesonide-formoterol 160-4.5 MCG/ACT inhaler Commonly known as:  SYMBICORT Inhale 2 puffs into the lungs 2 (two) times daily.   cilostazol 100 MG tablet Commonly known as:   PLETAL TAKE 1 TABLET TWICE DAILY.   ICY HOT 5 % Ptch Generic drug:  Menthol Apply 1 application topically daily. Remove at bedtime   ipratropium-albuterol 0.5-2.5 (3) MG/3ML Soln Commonly known as:  DUONEB Take 3 mLs by nebulization every 4 (four) hours as needed.   NUTRITIONAL SUPPLEMENT Liqd Take 120 mLs by mouth 2 (two) times daily.   oxyCODONE 5 MG immediate release tablet Commonly known as:  Oxy IR/ROXICODONE Take 1 tablet (5 mg total) by mouth every 4 (four) hours as needed for moderate pain or severe pain.   polyethylene glycol packet Commonly known as:  MIRALAX / GLYCOLAX Take 17 g by mouth daily.   PROCEL Powd Take 2 scoop by mouth 2 (two) times daily.   senna 8.6 MG Tabs tablet Commonly known as:  SENOKOT Take 2 tablets (17.2 mg total) by mouth at bedtime.   simvastatin 40 MG tablet Commonly known as:  ZOCOR Take 40 mg by mouth daily at 6 PM.       Immunizations:  There is no immunization history on file for this patient.   Physical Exam:  Vitals:   09/13/16 1534  BP: 110/63  Pulse: 88  Resp: 20  Temp: 98.2 F (36.8 C)  TempSrc: Oral  SpO2: 93%  Weight: 146 lb (66.2 kg)  Height: 5\' 2"  (1.575 m)   Body mass index is 26.7 kg/m.  General- elderly female, well built, in no acute distress Head- normocephalic, atraumatic Throat- moist mucus membrane Eyes- PERRLA, EOMI, no pallor, no icterus Neck- no cervical lymphadenopathy Cardiovascular- normal s1,s2, no murmur Respiratory- bilateral clear to auscultation, no wheeze, no rhonchi, no crackles, no use of accessory muscles Abdomen- bowel sounds present, soft, non tender Musculoskeletal- able to move all 4 extremities, generalized weakness Neurological- alert and oriented to person, place and time Skin- warm and dry Psychiatry- normal mood and affect    Labs reviewed: Basic Metabolic Panel:  Recent Labs  16/10/96 0401 09/05/16 1257 09/06/16 0444 09/12/16  NA 134* 138 133* 137  K 5.1  4.0 4.9 4.7  CL 102 104 106  --   CO2 26 24 23   --   GLUCOSE 116* 130* 130*  --   BUN 64* 65* 48* 26*  CREATININE 1.59* 1.66* 1.14* 0.9  CALCIUM 8.7* 8.5* 8.3*  --    Liver Function Tests: No results for input(s): AST, ALT, ALKPHOS, BILITOT, PROT, ALBUMIN in the last 8760 hours. No results for input(s): LIPASE, AMYLASE in the last 8760 hours. No results for input(s): AMMONIA in the last 8760 hours. CBC:  Recent Labs  09/01/16 1133 09/12/16  WBC 6.4 8.4  NEUTROABS 4.1  --   HGB 9.8* 9.5*  HCT 30.7* 29*  MCV 104.1*  --   PLT 309 331   Cardiac Enzymes:  Recent Labs  09/01/16 1940  CKTOTAL 139   BNP: Invalid input(s): POCBNP CBG:  Recent Labs  09/04/16 0848 09/05/16 0729 09/06/16 0735  GLUCAP 138* 118* 98    Radiological Exams: Dg Chest 2 View  Result Date: 09/01/2016 CLINICAL DATA:  Shortness of  breath and cough for 1 day. EXAM: CHEST  2 VIEW COMPARISON:  08/14/2015 FINDINGS: The cardiac silhouette, mediastinal and hilar contours are within normal limits and stable. Stable tortuosity and calcification of the thoracic aorta. Chronic lung changes but no acute pulmonary findings. No pleural effusion or pneumothorax. Stable advanced degenerative changes involving both shoulders. IMPRESSION: No acute cardiopulmonary findings. Electronically Signed   By: Rudie Meyer M.D.   On: 09/01/2016 20:21   Ct Hip Left Wo Contrast  Result Date: 09/01/2016 CLINICAL DATA:  Fall with left hip pain last night. Difficulty bearing weight. EXAM: CT OF THE LEFT HIP WITHOUT CONTRAST TECHNIQUE: Multidetector CT imaging of the left hip was performed according to the standard protocol. Multiplanar CT image reconstructions were also generated. COMPARISON:  09/01/2016 FINDINGS: Bones/Joint/Cartilage Mild to moderate degenerative chondral thinning in the left hip without visible fracture or hip effusion. Ligaments Suboptimally assessed by CT. Muscles and Tendons Mild thickening and increased  density distally in the iliopsoas musculotendinous junction compatible with tear or hematoma. I do not see well-defined discontinuity in the distal iliopsoas tendon. Soft tissues Left groin hernia contains a loop of small bowel without strangulation. IMPRESSION: 1. Abnormal high density in the distal iliopsoas muscle and tendon, suspicious for hematoma, less likely tendon rupture. 2. Left groin hernia containing a loop of small bowel without strangulation or obstruction. 3. Mild to moderate degenerative chondral thinning in the left hip. Electronically Signed   By: Gaylyn Rong M.D.   On: 09/01/2016 12:43   Dg Knee Complete 4 Views Left  Result Date: 08/26/2016 CLINICAL DATA:  Lateral left knee pain and swelling for 2 days EXAM: LEFT KNEE - COMPLETE 4+ VIEW COMPARISON:  None. FINDINGS: No acute fracture. No dislocation. Unremarkable soft tissues. Osteopenia. A moderate narrowing and juxta-articular sclerosis of the lateral compartment. IMPRESSION: No acute bony pathology.  Chronic changes. Electronically Signed   By: Jolaine Click M.D.   On: 08/26/2016 14:54   Dg Hip Unilat W Or Wo Pelvis 2-3 Views Left  Result Date: 09/01/2016 CLINICAL DATA:  Pain following fall EXAM: DG HIP (WITH OR WITHOUT PELVIS) 2-3V LEFT COMPARISON:  None. FINDINGS: Frontal pelvis as well as frontal and lateral left hip images were obtained. No evident fracture or dislocation. There is mild symmetric narrowing of both hip joints. No erosive change. There are foci of calcification in both common femoral and superficial femoral arteries. IMPRESSION: Mild symmetric narrowing of both hip joints. No fracture dislocation. Multifocal arterial atherosclerosis. Electronically Signed   By: Bretta Bang III M.D.   On: 09/01/2016 11:40    Assessment/Plan  Generalized weakness Will need for patient to work with physical therapy and occupational therapy team to help regain strength and balance.  Acute on chronic COPD  exacerbation Status post steroids and antibiotics in the hospital. Breathing currently stable. Continue Symbicort, when necessary DuoNeb  Iliopsoas hematoma Weightbearing as tolerated for now. Completed her tapering course of prednisone. Continue oxycodone 5 mg every 4 hours as needed for pain. PMR to evaluate the patient.  Constipation Add senna S2 tablets at bedtime and MiraLAX 1 dose for now and then on a daily basis as needed. Hydration to be maintained.  Blood loss  anemia With her recent hematoma. Fall precautions to be taken. monitor H&H  Osteoporosis Fall precautions to be taken. Continue weekly Fosamax.  Hypertension Continue amlodipine current regimen and her statin.  Peripheral arterial disease Continue cilostazol and baby aspirin  Protein calorie malnutrition RD to evaluate. Continue Procel supplement and  metatarsal supplement      Goals of care: short term rehabilitation   Labs/tests ordered: cbc  Family/ staff Communication: reviewed care plan with patient and nursing supervisor    Oneal GroutMAHIMA Kenitra Leventhal, MD Internal Medicine [facility] [address] [place], [place] [address] Cell Phone (Monday-Friday 8 am - 5 pm): [phone] On Call: [phone] and follow prompts after 5 pm and on weekends Office Phone: [phone] Office Fax: [phone]

## 2016-09-14 ENCOUNTER — Encounter: Payer: Self-pay | Admitting: *Deleted

## 2016-09-14 ENCOUNTER — Other Ambulatory Visit: Payer: Self-pay | Admitting: *Deleted

## 2016-09-14 NOTE — Patient Outreach (Signed)
[facility]) Care Management  09/14/2016  ASANTE RITACCO 11/12/1927 [id number]   CSW was able to make contact with patient and patient's caregiver, Claiborne Billings today to perform the initial assessment, as well as assess and assist with social work needs and services, when [place] met with patient at [facility] where patient currently resides to receive short-term rehabilitative services.  CSW introduced self, explained role and types of services provided through [facility] ([facility]).  CSW further explained to patient that CSW works with Marthenia Rolling, Hospital Liaison with [facility], also the individual making the referral. CSW then explained the reason for the visit, indicating that Ms. [place] Crane thought that patient would benefit from social work services and resources to assist with possible discharge planning needs and services from the skilled facility.  CSW obtained two HIPAA compliant identifiers from patient, which included patient's name and date of birth. Patient reported that she is being discharged home on Monday, December 4th, despite recommendations from physical therapy, occupational therapy and various family members,  that she would be better suited in an assisted living facility, receiving long-term care services.  Patient is adamant about returning home, even though she is unable to perform activities of daily living independently and has recently experienced frequent falls.  Patient reports being agreeable to home health physical therapy and occupational therapy, but Claiborne Billings (patient's caregiver 4 hours per day, 5 days per week) stated that patient will refuse the therapies once she returns home.  Claiborne Billings further reported that patient "will say anything to be discharged, even that she is able to care for herself independently".  CSW explained to [place] that as long as patient is of sound mind, patient is able to  decide her own discharge plan of care, despite recommendations otherwise.  CSW agreed to meet with patient and patient's family (Daughter - Boneta Lucks and Granddaughter - Arnette Norris) on Friday, December 1st at 12:00pm to further assist with discharge planning needs and services for patient. Nat Christen, BSW, MSW, LCSW  Licensed Education officer, [facility]  Mailing [place] [address] [place], [place] [address] Physical Address-[address], [place], [place] [address] Toll Free Main # [phone] Fax # [phone] Cell # [phone]  Fax # [phone]  [email or web address]

## 2016-09-15 DIAGNOSIS — I739 Peripheral vascular disease, unspecified: Secondary | ICD-10-CM | POA: Diagnosis not present

## 2016-09-15 DIAGNOSIS — R2681 Unsteadiness on feet: Secondary | ICD-10-CM | POA: Diagnosis not present

## 2016-09-15 DIAGNOSIS — M25552 Pain in left hip: Secondary | ICD-10-CM | POA: Diagnosis not present

## 2016-09-15 DIAGNOSIS — M25561 Pain in right knee: Secondary | ICD-10-CM | POA: Diagnosis not present

## 2016-09-15 DIAGNOSIS — M25512 Pain in left shoulder: Secondary | ICD-10-CM | POA: Diagnosis not present

## 2016-09-15 DIAGNOSIS — I70213 Atherosclerosis of native arteries of extremities with intermittent claudication, bilateral legs: Secondary | ICD-10-CM | POA: Diagnosis not present

## 2016-09-16 ENCOUNTER — Other Ambulatory Visit: Payer: Self-pay | Admitting: *Deleted

## 2016-09-16 DIAGNOSIS — M25512 Pain in left shoulder: Secondary | ICD-10-CM | POA: Diagnosis not present

## 2016-09-16 DIAGNOSIS — M25552 Pain in left hip: Secondary | ICD-10-CM | POA: Diagnosis not present

## 2016-09-16 DIAGNOSIS — I739 Peripheral vascular disease, unspecified: Secondary | ICD-10-CM | POA: Diagnosis not present

## 2016-09-16 DIAGNOSIS — M25561 Pain in right knee: Secondary | ICD-10-CM | POA: Diagnosis not present

## 2016-09-16 DIAGNOSIS — I70213 Atherosclerosis of native arteries of extremities with intermittent claudication, bilateral legs: Secondary | ICD-10-CM | POA: Diagnosis not present

## 2016-09-16 DIAGNOSIS — R2681 Unsteadiness on feet: Secondary | ICD-10-CM | POA: Diagnosis not present

## 2016-09-16 LAB — CBC AND DIFFERENTIAL
HCT: 28 % — AB (ref 36–46)
HEMOGLOBIN: 9 g/dL — AB (ref 12.0–16.0)
NEUTROS ABS: 7 /uL
PLATELETS: 342 10*3/uL (ref 150–399)
WBC: 9.7 10^3/mL

## 2016-09-16 LAB — HEPATIC FUNCTION PANEL
ALK PHOS: 77 U/L (ref 25–125)
ALT: 14 U/L (ref 7–35)
AST: 15 U/L (ref 13–35)
Bilirubin, Total: 0.6 mg/dL

## 2016-09-16 LAB — BASIC METABOLIC PANEL
BUN: 17 mg/dL (ref 4–21)
CREATININE: 0.7 mg/dL (ref 0.5–1.1)
Glucose: 93 mg/dL
Potassium: 4.2 mmol/L (ref 3.4–5.3)
Sodium: 136 mmol/L — AB (ref 137–147)

## 2016-09-16 NOTE — Patient Outreach (Signed)
[facility] Care Management  09/16/2016  Carrie Pena Jan 14, 1928 [id number]   CSW was able to meet with patient today to [facility] a [facility] patient currently resides to receive short-term rehabilitative services.  Patient's caregiver, Carrie Pena was also present during the visit, as patient's daughter, Carrie Pena could not be available to attend the meeting because she currently lives in [place].  However, Mrs. Carrie Pena was on a 3-way call with Carrie Pena so that she could be a part of the conversation and ask questions.  In talking with [facility]'s physical therapist [facility], CSW learned that patient will not be discharged on Monday, December 4th, as patient is still unable to transfer, stand, or walk without assistance.  Patient is adamant about returning home to live independently, reporting, "I don't have to prepare my meals because they are delivered to me daily through [employer]".  CSW inquired about patient's breakfast and dinner routine.  Patient reported, "I just fix me a little something while I sit in my wheelchair".  Carrie Pena indicated that she prepares patient's breakfast on the days that she is present and that she will often bring patient food to her home.  CSW spoke with patient about drinking supplements, such as Ensure or Boost, to help obtain all her nutrients, for which she was agreeable.  Carrie Pena agreed to start buying patient cases of something (whatever is recommended by patient's primary care physician) when she buys patient's groceries on a bi-weekly basis. CSW also inquired as to whether or not patient would be able to afford to increase Kelly's hours of home care services, as patient would greatly benefit from at least 2-3 more hours of care and supervision during the day.  Kelly admitted that she is available and that she would be thrilled to spend more time with patient and get paid, as she is currently  doing it "out of the goodness of her heart".  Mrs. Carrie Pena agreed to speak with her daughter, Carrie Pena, also patient's Therapist, sports[facility]ancial HealthCare Power of Attorney, to see if there is money in the budget to do so.  If the funds are available, Carrie Pena agreed to begin providing increased hours of home care services, effective immediately. CSW agreed to meet with patient again on Friday, December 15th at [facility], to attend patient's Discharge Planning Meeting, with all disciplines involved.  Patient will be provided a pass to go home with the physical therapist and occupational therapist at the facility to perform a home visit to assess needs.  Patient is agreeable to home health physical therapy and occupational therapy, as well as durable medical equipment being ordered through a home health agency of choice, prior to returning home. Danford BadJoanna Ahmadou Bolz, BSW, MSW, LCSW  Licensed Restaurant manager, fast foodClinical Social Worker  [facility]  Mailing [address], [place], [place] [address] Physical Address-[address], [place], [place] [address] Toll Free Main # [phone] Fax # [phone] Cell # [phone]  Fax # [phone]  [email or web address]        Danford BadJoanna Sohil Timko, BSW, MSW, LCSW  Licensed Clinical Social Financial plannerWorker  [facility]  [address], [place], [place] [address] Physical Address-[address], [place], [place] [address] Toll Free Main # [phone] Fax # [phone] Cell # [phone]  Fax # [phone]  [email or web address]

## 2016-09-19 LAB — BASIC METABOLIC PANEL
BUN: 15 mg/dL (ref 4–21)
Creatinine: 0.8 mg/dL (ref 0.5–1.1)
Potassium: 4.4 mmol/L (ref 3.4–5.3)
SODIUM: 136 mmol/L — AB (ref 137–147)

## 2016-09-20 DIAGNOSIS — M25552 Pain in left hip: Secondary | ICD-10-CM | POA: Diagnosis not present

## 2016-09-20 DIAGNOSIS — I70213 Atherosclerosis of native arteries of extremities with intermittent claudication, bilateral legs: Secondary | ICD-10-CM | POA: Diagnosis not present

## 2016-09-20 DIAGNOSIS — I739 Peripheral vascular disease, unspecified: Secondary | ICD-10-CM | POA: Diagnosis not present

## 2016-09-20 DIAGNOSIS — M25512 Pain in left shoulder: Secondary | ICD-10-CM | POA: Diagnosis not present

## 2016-09-20 DIAGNOSIS — R2681 Unsteadiness on feet: Secondary | ICD-10-CM | POA: Diagnosis not present

## 2016-09-20 DIAGNOSIS — M25561 Pain in right knee: Secondary | ICD-10-CM | POA: Diagnosis not present

## 2016-09-21 ENCOUNTER — Telehealth: Payer: Self-pay

## 2016-09-21 DIAGNOSIS — I739 Peripheral vascular disease, unspecified: Secondary | ICD-10-CM

## 2016-09-21 NOTE — Telephone Encounter (Signed)
[facility] requesting to make appt. for pt. ; was recently hospitalized and given recommendation to f/u with Dr. Darrick PennaFields in 1-2 months for progressive worsening in Claudication.  Noted last ABI's done 05/2014.  Will schedule ABI's with MD appt.  Spoke with Nurse, Marchelle FolksAmanda.  Advised a Scheduler will call back with appt. Information.

## 2016-09-22 ENCOUNTER — Ambulatory Visit: Payer: Medicare Other | Admitting: Vascular Surgery

## 2016-09-27 LAB — CBC AND DIFFERENTIAL
HEMATOCRIT: 28 % — AB (ref 36–46)
HEMOGLOBIN: 8.8 g/dL — AB (ref 12.0–16.0)
Neutrophils Absolute: 4 /uL
Platelets: 265 10*3/uL (ref 150–399)
WBC: 5.1 10^3/mL

## 2016-09-29 ENCOUNTER — Emergency Department (HOSPITAL_COMMUNITY): Payer: Medicare Other

## 2016-09-29 ENCOUNTER — Encounter (HOSPITAL_COMMUNITY): Payer: Self-pay | Admitting: Emergency Medicine

## 2016-09-29 ENCOUNTER — Inpatient Hospital Stay (HOSPITAL_COMMUNITY)
Admission: EM | Admit: 2016-09-29 | Discharge: 2016-10-03 | DRG: 190 | Disposition: A | Discharge status: Skilled Nursing Facility | Payer: Medicare Other | Attending: Internal Medicine | Admitting: Internal Medicine

## 2016-09-29 DIAGNOSIS — E785 Hyperlipidemia, unspecified: Secondary | ICD-10-CM | POA: Diagnosis present

## 2016-09-29 DIAGNOSIS — I2699 Other pulmonary embolism without acute cor pulmonale: Secondary | ICD-10-CM | POA: Diagnosis not present

## 2016-09-29 DIAGNOSIS — I10 Essential (primary) hypertension: Secondary | ICD-10-CM | POA: Diagnosis not present

## 2016-09-29 DIAGNOSIS — R109 Unspecified abdominal pain: Secondary | ICD-10-CM | POA: Diagnosis not present

## 2016-09-29 DIAGNOSIS — R069 Unspecified abnormalities of breathing: Secondary | ICD-10-CM | POA: Diagnosis not present

## 2016-09-29 DIAGNOSIS — Z886 Allergy status to analgesic agent status: Secondary | ICD-10-CM | POA: Diagnosis not present

## 2016-09-29 DIAGNOSIS — Z7951 Long term (current) use of inhaled steroids: Secondary | ICD-10-CM | POA: Diagnosis not present

## 2016-09-29 DIAGNOSIS — F411 Generalized anxiety disorder: Secondary | ICD-10-CM | POA: Diagnosis present

## 2016-09-29 DIAGNOSIS — J9621 Acute and chronic respiratory failure with hypoxia: Secondary | ICD-10-CM | POA: Diagnosis not present

## 2016-09-29 DIAGNOSIS — I110 Hypertensive heart disease with heart failure: Secondary | ICD-10-CM | POA: Diagnosis present

## 2016-09-29 DIAGNOSIS — J441 Chronic obstructive pulmonary disease with (acute) exacerbation: Secondary | ICD-10-CM | POA: Diagnosis present

## 2016-09-29 DIAGNOSIS — S7012XA Contusion of left thigh, initial encounter: Secondary | ICD-10-CM | POA: Diagnosis not present

## 2016-09-29 DIAGNOSIS — R0602 Shortness of breath: Secondary | ICD-10-CM

## 2016-09-29 DIAGNOSIS — N179 Acute kidney failure, unspecified: Secondary | ICD-10-CM | POA: Diagnosis not present

## 2016-09-29 DIAGNOSIS — Z9889 Other specified postprocedural states: Secondary | ICD-10-CM | POA: Diagnosis not present

## 2016-09-29 DIAGNOSIS — D649 Anemia, unspecified: Secondary | ICD-10-CM | POA: Diagnosis not present

## 2016-09-29 DIAGNOSIS — D539 Nutritional anemia, unspecified: Secondary | ICD-10-CM | POA: Diagnosis present

## 2016-09-29 DIAGNOSIS — R296 Repeated falls: Secondary | ICD-10-CM | POA: Diagnosis present

## 2016-09-29 DIAGNOSIS — Z79899 Other long term (current) drug therapy: Secondary | ICD-10-CM

## 2016-09-29 DIAGNOSIS — K219 Gastro-esophageal reflux disease without esophagitis: Secondary | ICD-10-CM | POA: Diagnosis present

## 2016-09-29 DIAGNOSIS — R531 Weakness: Secondary | ICD-10-CM | POA: Diagnosis present

## 2016-09-29 DIAGNOSIS — Z8249 Family history of ischemic heart disease and other diseases of the circulatory system: Secondary | ICD-10-CM

## 2016-09-29 DIAGNOSIS — F0390 Unspecified dementia without behavioral disturbance: Secondary | ICD-10-CM | POA: Diagnosis present

## 2016-09-29 DIAGNOSIS — I714 Abdominal aortic aneurysm, without rupture, unspecified: Secondary | ICD-10-CM | POA: Diagnosis present

## 2016-09-29 DIAGNOSIS — F4320 Adjustment disorder, unspecified: Secondary | ICD-10-CM | POA: Diagnosis not present

## 2016-09-29 DIAGNOSIS — Z87891 Personal history of nicotine dependence: Secondary | ICD-10-CM | POA: Diagnosis not present

## 2016-09-29 DIAGNOSIS — Z993 Dependence on wheelchair: Secondary | ICD-10-CM

## 2016-09-29 DIAGNOSIS — Z66 Do not resuscitate: Secondary | ICD-10-CM | POA: Diagnosis present

## 2016-09-29 DIAGNOSIS — I5032 Chronic diastolic (congestive) heart failure: Secondary | ICD-10-CM | POA: Diagnosis present

## 2016-09-29 DIAGNOSIS — R0600 Dyspnea, unspecified: Secondary | ICD-10-CM | POA: Diagnosis not present

## 2016-09-29 DIAGNOSIS — S3991XA Unspecified injury of abdomen, initial encounter: Secondary | ICD-10-CM | POA: Diagnosis not present

## 2016-09-29 DIAGNOSIS — J9601 Acute respiratory failure with hypoxia: Secondary | ICD-10-CM | POA: Diagnosis not present

## 2016-09-29 DIAGNOSIS — I5033 Acute on chronic diastolic (congestive) heart failure: Secondary | ICD-10-CM | POA: Diagnosis present

## 2016-09-29 DIAGNOSIS — M1990 Unspecified osteoarthritis, unspecified site: Secondary | ICD-10-CM | POA: Diagnosis present

## 2016-09-29 DIAGNOSIS — J9811 Atelectasis: Secondary | ICD-10-CM | POA: Diagnosis present

## 2016-09-29 DIAGNOSIS — J449 Chronic obstructive pulmonary disease, unspecified: Secondary | ICD-10-CM | POA: Diagnosis present

## 2016-09-29 DIAGNOSIS — R079 Chest pain, unspecified: Secondary | ICD-10-CM | POA: Diagnosis not present

## 2016-09-29 DIAGNOSIS — R911 Solitary pulmonary nodule: Secondary | ICD-10-CM | POA: Diagnosis not present

## 2016-09-29 DIAGNOSIS — Z7982 Long term (current) use of aspirin: Secondary | ICD-10-CM

## 2016-09-29 DIAGNOSIS — Z808 Family history of malignant neoplasm of other organs or systems: Secondary | ICD-10-CM | POA: Diagnosis not present

## 2016-09-29 LAB — CBC WITH DIFFERENTIAL/PLATELET
Basophils Absolute: 0 10*3/uL (ref 0.0–0.1)
Basophils Relative: 0 %
EOS ABS: 0 10*3/uL (ref 0.0–0.7)
EOS PCT: 0 %
HCT: 28.7 % — ABNORMAL LOW (ref 36.0–46.0)
Hemoglobin: 9.6 g/dL — ABNORMAL LOW (ref 12.0–15.0)
LYMPHS PCT: 11 %
Lymphs Abs: 0.6 10*3/uL — ABNORMAL LOW (ref 0.7–4.0)
MCH: 34.3 pg — AB (ref 26.0–34.0)
MCHC: 33.4 g/dL (ref 30.0–36.0)
MCV: 102.5 fL — AB (ref 78.0–100.0)
MONO ABS: 0.3 10*3/uL (ref 0.1–1.0)
Monocytes Relative: 5 %
Neutro Abs: 4.7 10*3/uL (ref 1.7–7.7)
Neutrophils Relative %: 84 %
PLATELETS: 259 10*3/uL (ref 150–400)
RBC: 2.8 MIL/uL — AB (ref 3.87–5.11)
RDW: 16.1 % — AB (ref 11.5–15.5)
WBC: 5.6 10*3/uL (ref 4.0–10.5)

## 2016-09-29 LAB — COMPREHENSIVE METABOLIC PANEL
ALT: 14 U/L (ref 14–54)
AST: 27 U/L (ref 15–41)
Albumin: 3.1 g/dL — ABNORMAL LOW (ref 3.5–5.0)
Alkaline Phosphatase: 88 U/L (ref 38–126)
Anion gap: 9 (ref 5–15)
BUN: 14 mg/dL (ref 6–20)
CHLORIDE: 102 mmol/L (ref 101–111)
CO2: 23 mmol/L (ref 22–32)
Calcium: 8.2 mg/dL — ABNORMAL LOW (ref 8.9–10.3)
Creatinine, Ser: 0.89 mg/dL (ref 0.44–1.00)
GFR, EST NON AFRICAN AMERICAN: 57 mL/min — AB (ref >60)
Glucose, Bld: 97 mg/dL (ref 65–99)
POTASSIUM: 3.7 mmol/L (ref 3.5–5.1)
SODIUM: 134 mmol/L — AB (ref 135–145)
Total Bilirubin: 0.8 mg/dL (ref 0.3–1.2)
Total Protein: 7.6 g/dL (ref 6.5–8.1)

## 2016-09-29 LAB — URINALYSIS, ROUTINE W REFLEX MICROSCOPIC
Bacteria, UA: NONE SEEN
Bilirubin Urine: NEGATIVE
GLUCOSE, UA: NEGATIVE mg/dL
Ketones, ur: 5 mg/dL — AB
LEUKOCYTES UA: NEGATIVE
NITRITE: NEGATIVE
PH: 5 (ref 5.0–8.0)
Protein, ur: NEGATIVE mg/dL
SPECIFIC GRAVITY, URINE: 1.01 (ref 1.005–1.030)

## 2016-09-29 LAB — BASIC METABOLIC PANEL
BUN: 14 mg/dL (ref 4–21)
CREATININE: 0.8 mg/dL (ref 0.5–1.1)
Glucose: 85 mg/dL
POTASSIUM: 4.3 mmol/L (ref 3.4–5.3)
Sodium: 136 mmol/L — AB (ref 137–147)

## 2016-09-29 LAB — I-STAT TROPONIN, ED: TROPONIN I, POC: 0.01 ng/mL (ref 0.00–0.08)

## 2016-09-29 LAB — CBC AND DIFFERENTIAL
HCT: 29 % — AB (ref 36–46)
Hemoglobin: 9.3 g/dL — AB (ref 12.0–16.0)
Platelets: 248 10*3/uL (ref 150–399)
WBC: 6.1 10^3/mL

## 2016-09-29 LAB — I-STAT CG4 LACTIC ACID, ED: LACTIC ACID, VENOUS: 1.38 mmol/L (ref 0.5–1.9)

## 2016-09-29 LAB — BRAIN NATRIURETIC PEPTIDE: B Natriuretic Peptide: 38.3 pg/mL (ref 0.0–100.0)

## 2016-09-29 LAB — MRSA PCR SCREENING: MRSA by PCR: NEGATIVE

## 2016-09-29 MED ORDER — MOMETASONE FURO-FORMOTEROL FUM 200-5 MCG/ACT IN AERO
2.0000 | INHALATION_SPRAY | Freq: Two times a day (BID) | RESPIRATORY_TRACT | Status: DC
  Administered 2016-09-29 – 2016-10-03 (×8): 2 via RESPIRATORY_TRACT
  Filled 2016-09-29: qty 8.8

## 2016-09-29 MED ORDER — ENOXAPARIN SODIUM 40 MG/0.4ML ~~LOC~~ SOLN
40.0000 mg | SUBCUTANEOUS | Status: DC
  Administered 2016-09-29 – 2016-10-02 (×4): 40 mg via SUBCUTANEOUS
  Filled 2016-09-29 (×4): qty 0.4

## 2016-09-29 MED ORDER — SENNOSIDES-DOCUSATE SODIUM 8.6-50 MG PO TABS
2.0000 | ORAL_TABLET | Freq: Two times a day (BID) | ORAL | Status: DC
  Administered 2016-09-29 – 2016-10-03 (×8): 2 via ORAL
  Filled 2016-09-29 (×8): qty 2

## 2016-09-29 MED ORDER — IPRATROPIUM-ALBUTEROL 0.5-2.5 (3) MG/3ML IN SOLN
3.0000 mL | Freq: Once | RESPIRATORY_TRACT | Status: AC
  Administered 2016-09-29: 3 mL via RESPIRATORY_TRACT
  Filled 2016-09-29: qty 3

## 2016-09-29 MED ORDER — CILOSTAZOL 100 MG PO TABS
100.0000 mg | ORAL_TABLET | Freq: Two times a day (BID) | ORAL | Status: DC
  Administered 2016-09-29 – 2016-10-03 (×8): 100 mg via ORAL
  Filled 2016-09-29 (×10): qty 1

## 2016-09-29 MED ORDER — PREDNISONE 20 MG PO TABS
40.0000 mg | ORAL_TABLET | Freq: Every day | ORAL | Status: DC
  Administered 2016-09-30 – 2016-10-03 (×4): 40 mg via ORAL
  Filled 2016-09-29 (×4): qty 2

## 2016-09-29 MED ORDER — POLYETHYLENE GLYCOL 3350 17 G PO PACK
17.0000 g | PACK | Freq: Two times a day (BID) | ORAL | Status: DC
  Administered 2016-09-29 – 2016-10-03 (×8): 17 g via ORAL
  Filled 2016-09-29 (×8): qty 1

## 2016-09-29 MED ORDER — MENTHOL (TOPICAL ANALGESIC) 5 % EX PTCH: 1.0000 "application " | MEDICATED_PATCH | Freq: Every day | CUTANEOUS | Status: DC

## 2016-09-29 MED ORDER — AMLODIPINE BESYLATE 10 MG PO TABS
10.0000 mg | ORAL_TABLET | Freq: Every day | ORAL | Status: DC
  Administered 2016-09-30 – 2016-10-03 (×4): 10 mg via ORAL
  Filled 2016-09-29 (×4): qty 1

## 2016-09-29 MED ORDER — OXYCODONE HCL 5 MG PO TABS
5.0000 mg | ORAL_TABLET | ORAL | Status: DC | PRN
  Administered 2016-09-30 – 2016-10-01 (×3): 5 mg via ORAL
  Filled 2016-09-29 (×3): qty 1

## 2016-09-29 MED ORDER — SIMVASTATIN 40 MG PO TABS
40.0000 mg | ORAL_TABLET | Freq: Every day | ORAL | Status: DC
Start: 2016-09-30 — End: 2016-09-30

## 2016-09-29 MED ORDER — ENSURE ENLIVE PO LIQD
237.0000 mL | Freq: Two times a day (BID) | ORAL | Status: DC
  Administered 2016-09-30 – 2016-10-01 (×2): 237 mL via ORAL

## 2016-09-29 MED ORDER — ASPIRIN EC 81 MG PO TBEC
81.0000 mg | DELAYED_RELEASE_TABLET | Freq: Every day | ORAL | Status: DC
  Administered 2016-09-30 – 2016-10-03 (×4): 81 mg via ORAL
  Filled 2016-09-29 (×4): qty 1

## 2016-09-29 MED ORDER — IPRATROPIUM-ALBUTEROL 0.5-2.5 (3) MG/3ML IN SOLN
3.0000 mL | RESPIRATORY_TRACT | Status: DC | PRN
  Administered 2016-10-02: 3 mL via RESPIRATORY_TRACT
  Filled 2016-09-29: qty 3

## 2016-09-29 MED ORDER — METHYLPREDNISOLONE SODIUM SUCC 125 MG IJ SOLR
125.0000 mg | Freq: Once | INTRAMUSCULAR | Status: AC
  Administered 2016-09-29: 125 mg via INTRAVENOUS
  Filled 2016-09-29: qty 2

## 2016-09-29 NOTE — ED Notes (Signed)
Bed: WA07 Expected date:  Expected time:  Means of arrival:  Comments: EMS- 82yo F, SOB

## 2016-09-29 NOTE — H&P (Signed)
History and Physical    LEEAN Pena ZOX:[id number] DOB: 1928/02/22 DOA: 09/29/2016  PCP: Thayer Headings, MD Patient coming from: [facility]  Chief Complaint: Dyspnea  HPI: Carrie Pena is a 82 y.o. female with medical history significant of diastolic heart failure, COPD, hypertension, iliopsoas muscle hematoma, macrocytic anemia. Patient has had about 3 days of worsening productive cough and dyspnea. She has required oxygen for the last two days which is new. Oxygen has helped with symptoms. She reports no associated wheezing. She is not ambulatory at this time. No aggravating factors.  ED Course: Vitals: Afebrile, normal pulse, respirations in high 10s, low 20s. On 4L . Slightly hypertensive. Labs: Sodium of 134. Calcium of 8.9 Imaging: CXR with no acute process, tortuous and stable aorta Medications/Course: Duoneb and solumedrol  Review of Systems: Review of Systems  Constitutional: Negative for chills and fever.  Respiratory: Positive for cough, sputum production and shortness of breath. Negative for hemoptysis and wheezing.   Cardiovascular: Negative for chest pain, palpitations and leg swelling.  Gastrointestinal: Negative for abdominal pain, constipation, diarrhea, nausea and vomiting.  Genitourinary: Negative for dysuria.  All other systems reviewed and are negative.   Past Medical History:  Diagnosis Date  . Carpal tunnel syndrome   . COPD (chronic obstructive pulmonary disease) (HCC)   . GERD (gastroesophageal reflux disease)   . Hyperlipidemia   . Hypertension   . Osteoarthritis     Past Surgical History:  Procedure Laterality Date  . KNEE ARTHROSCOPY    . left carpal tunnel    . TONSILLECTOMY       reports that she quit smoking about 18 months ago. Her smoking use included Cigarettes. She smoked 0.50 packs per day. She has never used smokeless tobacco. She reports that she does not drink alcohol or use drugs.  Allergies  Allergen Reactions  .  Tylenol [Acetaminophen] Other (See Comments)    Reaction:  Shaking     Family History  Problem Relation Age of Onset  . Cancer Mother   . Cancer Father   . Hypertension Daughter   . Heart disease Daughter     before age 49    Prior to Admission medications   Medication Sig Start Date End Date Taking? Authorizing Provider  alendronate (FOSAMAX) 70 MG tablet Take 70 mg by mouth every Thursday. Take with a full glass of water on an empty stomach.    Yes Historical Provider, MD  amLODipine (NORVASC) 10 MG tablet Take 1 tablet (10 mg total) by mouth daily. 08/15/15  Yes Jeralyn Bennett, MD  aspirin EC 81 MG tablet Take 81 mg by mouth daily.   Yes Historical Provider, MD  budesonide-formoterol (SYMBICORT) 160-4.5 MCG/ACT inhaler Inhale 2 puffs into the lungs 2 (two) times daily.   Yes Historical Provider, MD  cilostazol (PLETAL) 100 MG tablet Take 100 mg by mouth 2 (two) times daily.   Yes Historical Provider, MD  ipratropium-albuterol (DUONEB) 0.5-2.5 (3) MG/3ML SOLN Take 3 mLs by nebulization every 4 (four) hours as needed (for wheezing/shortness of breath).   Yes Historical Provider, MD  Menthol (ICY HOT) 5 % PTCH Apply 1 application topically daily. Pt applies to left upper arm every morning and removes at bedtime.   Yes Historical Provider, MD  Nutritional Supplements (NUTRITIONAL SUPPLEMENT PLUS) LIQD Take 120 mLs by mouth 2 (two) times daily between meals. Med Pass   Yes Historical Provider, MD  oxyCODONE (OXY IR/ROXICODONE) 5 MG immediate release tablet Take 1 tablet (5 mg total) by  mouth every 4 (four) hours as needed for moderate pain or severe pain. 09/06/16  Yes Renae FickleMackenzie Short, MD  polyethylene glycol (MIRALAX / GLYCOLAX) packet Take 17 g by mouth 2 (two) times daily.   Yes Historical Provider, MD  Protein (PROCEL) POWD Take 2 scoop by mouth 2 (two) times daily.   Yes Historical Provider, MD  senna-docusate (SENOKOT-S) 8.6-50 MG tablet Take 2 tablets by mouth 2 (two) times daily.    Yes Historical Provider, MD  simvastatin (ZOCOR) 40 MG tablet Take 40 mg by mouth daily at 6 PM.   Yes Historical Provider, MD    Physical Exam: Vitals:   09/29/16 1422 09/29/16 1641 09/29/16 1754 09/29/16 1951  BP: 142/65 147/75 (!) 143/85   Pulse: 99 90 90 88  Resp: 19 22 20 20   Temp:   99.1 F (37.3 C)   TempSrc:   Oral   SpO2: 92% 92% 93% 93%  Weight:   66.2 kg (146 lb)   Height:   5\' 3"  (1.6 m)      Constitutional: NAD, calm, comfortable Eyes: PERRL, lids and conjunctivae normal ENMT: Mucous membranes are moist. Posterior pharynx clear of any exudate or lesions.  Neck: normal, supple, no masses, no thyromegaly Respiratory: clear to auscultation bilaterally, no wheezing, no crackles. Slightly increased effort with very mild supraclavicular retractions.  Cardiovascular: Regular rate and rhythm, no murmurs / rubs / gallops. 2+ extremity edema. 2+ pedal pulses.  Abdomen: no tenderness, no masses palpated. Bowel sounds positive.  Musculoskeletal: no clubbing / cyanosis. No joint deformity upper and lower extremities. Good ROM, no contractures. Normal muscle tone.  Skin: no rashes, lesions, ulcers. No induration Neurologic: CN 2-12 grossly intact. Sensation intact, DTR normal. Strength 4/5 in upper extremities. 3/5 in lower extremities Psychiatric: Normal judgment and insight. Alert and oriented x 3. Normal mood.   Labs on Admission: I have personally reviewed following labs and imaging studies  CBC:  Recent Labs Lab 09/29/16 1419  WBC 5.6  NEUTROABS 4.7  HGB 9.6*  HCT 28.7*  MCV 102.5*  PLT 259   Basic Metabolic Panel:  Recent Labs Lab 09/29/16 1419  NA 134*  K 3.7  CL 102  CO2 23  GLUCOSE 97  BUN 14  CREATININE 0.89  CALCIUM 8.2*   GFR: Estimated Creatinine Clearance: 41.5 mL/min (by C-G formula based on SCr of 0.89 mg/dL). Liver Function Tests:  Recent Labs Lab 09/29/16 1419  AST 27  ALT 14  ALKPHOS 88  BILITOT 0.8  PROT 7.6  ALBUMIN 3.1*    No results for input(s): LIPASE, AMYLASE in the last 168 hours. No results for input(s): AMMONIA in the last 168 hours. Coagulation Profile: No results for input(s): INR, PROTIME in the last 168 hours. Cardiac Enzymes: No results for input(s): CKTOTAL, CKMB, CKMBINDEX, TROPONINI in the last 168 hours. BNP (last 3 results) No results for input(s): PROBNP in the last 8760 hours. HbA1C: No results for input(s): HGBA1C in the last 72 hours. CBG: No results for input(s): GLUCAP in the last 168 hours. Lipid Profile: No results for input(s): CHOL, HDL, LDLCALC, TRIG, CHOLHDL, LDLDIRECT in the last 72 hours. Thyroid Function Tests: No results for input(s): TSH, T4TOTAL, FREET4, T3FREE, THYROIDAB in the last 72 hours. Anemia Panel: No results for input(s): VITAMINB12, FOLATE, FERRITIN, TIBC, IRON, RETICCTPCT in the last 72 hours. Urine analysis:    Component Value Date/Time   COLORURINE YELLOW 09/29/2016 1240   APPEARANCEUR HAZY (A) 09/29/2016 1240   LABSPEC 1.010 09/29/2016 1240  PHURINE 5.0 09/29/2016 1240   GLUCOSEU NEGATIVE 09/29/2016 1240   HGBUR SMALL (A) 09/29/2016 1240   BILIRUBINUR NEGATIVE 09/29/2016 1240   KETONESUR 5 (A) 09/29/2016 1240   PROTEINUR NEGATIVE 09/29/2016 1240   UROBILINOGEN 0.2 11/08/2009 1426   NITRITE NEGATIVE 09/29/2016 1240   LEUKOCYTESUR NEGATIVE 09/29/2016 1240   Sepsis Labs: !!!!!!!!!!!!!!!!!!!!!!!!!!!!!!!!!!!!!!!!!!!! @LABRCNTIP (procalcitonin:4,lacticidven:4) )No results found for this or any previous visit (from the past 240 hour(s)).   Radiological Exams on Admission: Dg Chest 2 View  Result Date: 09/29/2016 CLINICAL DATA: Chest pain EXAM: CHEST  2 VIEW COMPARISON:  September 01, 2016 FINDINGS: There is slight scarring in the right base. There is no edema or consolidation. Heart size and pulmonary vascularity are normal. No adenopathy. There is atherosclerotic calcification in the aorta. Aorta is tortuous but stable. There is arthropathy in  each shoulder. IMPRESSION: No edema or consolidation. Slight scarring right base. Stable cardiac silhouette. There aorta is tortuous with aortic atherosclerosis. Electronically Signed   By: Bretta BangWilliam  Woodruff III M.D.   On: 09/29/2016 13:30    EKG: Independently reviewed. Sinus rhythm  Assessment/Plan Principal Problem:   COPD with acute exacerbation (HCC) Active Problems:   Essential hypertension, benign   Iliopsoas muscle hematoma, left, initial encounter   Macrocytic anemia   COPD (chronic obstructive pulmonary disease) (HCC)   Chronic diastolic heart failure (HCC)   COPD exacerbation Acute respiratory failure with hypoxia On 4L O2. Tried to wean, but failed. -duonebs -Dulera -prednisone 40mg  -since mild, will hold off on antibiotics -wean O2 to room air   Anemia Macrocytic. Stable. Folate and B12 normal from November labs  Iliopsoas hematoma -Pain management  Hypertension -continue amlodipine  Chronic diastolic heart failure Stable. Could not find an echo  DVT prophylaxis: Lovenox Code Status: DNR Family Communication: Caregiver at bedside Disposition Plan: Discharge back to SNF in 2-3 days\ Consults called: None Admission status: Inpatient, medical floor   Jacquelin Hawkingalph Nettey, MD [facility] Pager [phone]  If 7PM-7AM, please contact night-coverage [email or web address] Password [email or web address]  09/29/2016, 7:56 PM

## 2016-09-29 NOTE — ED Notes (Signed)
Respiratory called for breathing treatment.

## 2016-09-29 NOTE — ED Triage Notes (Signed)
Per EMS[facility], c/o SOB and wheezing since this morning. Staff provided patient with rescue inhaler with no relief. Breathing treatment with EMS. Denies chest pain, abdominal pain, N/V/D.

## 2016-09-29 NOTE — ED Notes (Signed)
Patient aware that a urine sample is needed. Patient unable to provide at this time. Patient will notify staff when able.

## 2016-09-29 NOTE — ED Provider Notes (Signed)
[facility]-EMERGENCY DEPT Provider Note   CSN: [id number] Arrival date & time: 09/29/16  1203     History   Chief Complaint Chief Complaint  Patient presents with  . Shortness of Breath    HPI Carrie Pena is a 82 y.o. female.  HPI  Patient is a 64 female presenting with shortness of breath. Patient has history of COPD, hypertension, hyperlipidemia. Patient denies any chest pain. Patient is not a good historian but says that she woke up this morning with shortness of breath. She says she had a fall and she has a bad knee. It is unclear whether this was the truth or not. EMS and nursing facility did not report it..    Past Medical History:  Diagnosis Date  . Carpal tunnel syndrome   . COPD (chronic obstructive pulmonary disease) (HCC)   . GERD (gastroesophageal reflux disease)   . Hyperlipidemia   . Hypertension   . Osteoarthritis     Patient Active Problem List   Diagnosis Date Noted  . Acute diastolic heart failure (HCC) 09/06/2016  . AKI (acute kidney injury) (HCC) 09/06/2016  . Iliopsoas muscle hematoma, left, initial encounter 09/01/2016  . Intractable pain 09/01/2016  . Macrocytic anemia 09/01/2016  . Essential hypertension, benign 08/22/2015  . COPD with acute exacerbation (HCC) 08/12/2015  . Respiratory failure (HCC) 08/12/2015  . CAP (community acquired pneumonia) 08/12/2015  . Pain of right lower extremity 05/06/2014  . Swelling of limb 05/06/2014  . Atherosclerosis of native arteries of the extremities with intermittent claudication 07/18/2011    Past Surgical History:  Procedure Laterality Date  . KNEE ARTHROSCOPY    . left carpal tunnel    . TONSILLECTOMY      OB History    No data available       Home Medications    Prior to Admission medications   Medication Sig Start Date End Date Taking? Authorizing Provider  alendronate (FOSAMAX) 70 MG tablet Take 70 mg by mouth once a week. Take with a full glass of water on an empty stomach.     Historical Provider, MD  amLODipine (NORVASC) 10 MG tablet Take 1 tablet (10 mg total) by mouth daily. 08/15/15   Jeralyn Bennett, MD  aspirin 81 MG tablet Take 81 mg by mouth daily.      Historical Provider, MD  budesonide-formoterol (SYMBICORT) 160-4.5 MCG/ACT inhaler Inhale 2 puffs into the lungs 2 (two) times daily.    Historical Provider, MD  cilostazol (PLETAL) 100 MG tablet TAKE 1 TABLET TWICE DAILY. 01/14/13   Fransisco Hertz, MD  ipratropium-albuterol (DUONEB) 0.5-2.5 (3) MG/3ML SOLN Take 3 mLs by nebulization every 4 (four) hours as needed. 08/15/15   Jeralyn Bennett, MD  Menthol (ICY HOT) 5 % PTCH Apply 1 application topically daily. Remove at bedtime    Historical Provider, MD  NUTRITIONAL SUPPLEMENT LIQD Take 120 mLs by mouth 2 (two) times daily.    Historical Provider, MD  oxyCODONE (OXY IR/ROXICODONE) 5 MG immediate release tablet Take 1 tablet (5 mg total) by mouth every 4 (four) hours as needed for moderate pain or severe pain. 09/06/16   Renae Fickle, MD  polyethylene glycol [facility] / GLYCOLAX) packet Take 17 g by mouth daily. 09/06/16   Renae Fickle, MD  Protein (PROCEL) POWD Take 2 scoop by mouth 2 (two) times daily.    Historical Provider, MD  senna (SENOKOT) 8.6 MG TABS tablet Take 2 tablets (17.2 mg total) by mouth at bedtime. 09/06/16   Renae Fickle,  MD  simvastatin (ZOCOR) 40 MG tablet Take 40 mg by mouth daily at 6 PM.    Historical Provider, MD    Family History Family History  Problem Relation Age of Onset  . Cancer Mother   . Cancer Father   . Hypertension Daughter   . Heart disease Daughter     before age 80    Social History Social History  Substance Use Topics  . Smoking status: Former Smoker    Packs/day: 0.50    Types: Cigarettes    Quit date: 03/12/2015  . Smokeless tobacco: Never Used  . Alcohol use No     Allergies   Tylenol [acetaminophen]   Review of Systems Review of Systems  Constitutional: Negative for fatigue and fever.    Respiratory: Positive for cough, shortness of breath and wheezing. Negative for chest tightness.   Cardiovascular: Negative for chest pain.  All other systems reviewed and are negative.    Physical Exam Updated Vital Signs BP 142/65   Pulse 99   Temp 98.6 F (37 C) (Oral)   Resp 19   Ht 5\' 2"  (1.575 m)   Wt 146 lb (66.2 kg)   SpO2 92%   BMI 26.70 kg/m   Physical Exam  Constitutional: She is oriented to person, place, and time. She appears well-developed and well-nourished.  HENT:  Head: Normocephalic and atraumatic.  Eyes: Pupils are equal, round, and reactive to light. Right eye exhibits no discharge. Left eye exhibits no discharge.  Cardiovascular: Normal rate, regular rhythm and normal heart sounds.   No murmur heard. Pulmonary/Chest: Effort normal. No respiratory distress. She has wheezes. She has no rales.  Abdominal: Soft. She exhibits no distension. There is no tenderness.  Neurological: She is oriented to person, place, and time.  Skin: Skin is warm and dry. She is not diaphoretic.  Psychiatric: She has a normal mood and affect.  Nursing note and vitals reviewed.    ED Treatments / Results  Labs (all labs ordered are listed, but only abnormal results are displayed) Labs Reviewed  CBC WITH DIFFERENTIAL/PLATELET - Abnormal; Notable for the following:       Result Value   RBC 2.80 (*)    Hemoglobin 9.6 (*)    HCT 28.7 (*)    MCV 102.5 (*)    MCH 34.3 (*)    RDW 16.1 (*)    Lymphs Abs 0.6 (*)    All other components within normal limits  COMPREHENSIVE METABOLIC PANEL - Abnormal; Notable for the following:    Sodium 134 (*)    Calcium 8.2 (*)    Albumin 3.1 (*)    GFR calc non Af Amer 57 (*)    All other components within normal limits  BRAIN NATRIURETIC PEPTIDE  URINALYSIS, ROUTINE W REFLEX MICROSCOPIC  I-STAT CG4 LACTIC ACID, ED  I-STAT TROPOININ, ED  I-STAT CG4 LACTIC ACID, ED    EKG  EKG Interpretation None      EKG shows no signs of  ischemia, did not cross over.   Radiology Dg Chest 2 View  Result Date: 09/29/2016 CLINICAL DATA: Chest pain EXAM: CHEST  2 VIEW COMPARISON:  September 01, 2016 FINDINGS: There is slight scarring in the right base. There is no edema or consolidation. Heart size and pulmonary vascularity are normal. No adenopathy. There is atherosclerotic calcification in the aorta. Aorta is tortuous but stable. There is arthropathy in each shoulder. IMPRESSION: No edema or consolidation. Slight scarring right base. Stable cardiac silhouette. There aorta  is tortuous with aortic atherosclerosis. Electronically Signed   By: Bretta BangWilliam  Woodruff III M.D.   On: 09/29/2016 13:30    Procedures Procedures (including critical care time)  Medications Ordered in ED Medications  ipratropium-albuterol (DUONEB) 0.5-2.5 (3) MG/3ML nebulizer solution 3 mL (3 mLs Nebulization Given 09/29/16 1306)  methylPREDNISolone sodium succinate (SOLU-MEDROL) 125 mg/2 mL injection 125 mg (125 mg Intravenous Given 09/29/16 1304)     Initial Impression / Assessment and Plan / ED Course  I have reviewed the triage vital signs and the nursing notes.  Pertinent labs & imaging results that were available during my care of the patient were reviewed by me and considered in my medical decision making (see chart for details).  Clinical Course     Patient is an 82 year old female with poor historical inside. She is presenting today with shortness of breath. Patient is 92% on 5 L. She has diffuse wheezing. She received DuoNeb prior to arrival. We will repeat another DuoNeb here, give Solu-Medrol.  Patient has +3 pitting edema bilateral lower extremities. She has no history of CHF, no echo in our system. Concern for new heart failure. In addition to his obvious COPD exacerbation. We will get BNP. Will likely need admission to hospital given new hypoxia.   Xray shows no CHF. Will treat for COPD exacerbation,  Will require admissin for O2 requirement.     Final Clinical Impressions(s) / ED Diagnoses   Final diagnoses:  None    New Prescriptions New Prescriptions   No medications on file     Sheanna Dail Randall AnLyn Gissela Bloch, MD 09/29/16 1529

## 2016-09-30 ENCOUNTER — Inpatient Hospital Stay (HOSPITAL_COMMUNITY): Payer: Medicare Other

## 2016-09-30 ENCOUNTER — Other Ambulatory Visit: Payer: Self-pay | Admitting: *Deleted

## 2016-09-30 DIAGNOSIS — Z79891 Long term (current) use of opiate analgesic: Secondary | ICD-10-CM

## 2016-09-30 DIAGNOSIS — Z79899 Other long term (current) drug therapy: Secondary | ICD-10-CM

## 2016-09-30 DIAGNOSIS — Z808 Family history of malignant neoplasm of other organs or systems: Secondary | ICD-10-CM

## 2016-09-30 DIAGNOSIS — R0600 Dyspnea, unspecified: Secondary | ICD-10-CM

## 2016-09-30 DIAGNOSIS — F4320 Adjustment disorder, unspecified: Secondary | ICD-10-CM

## 2016-09-30 DIAGNOSIS — Z8249 Family history of ischemic heart disease and other diseases of the circulatory system: Secondary | ICD-10-CM

## 2016-09-30 DIAGNOSIS — Z9889 Other specified postprocedural states: Secondary | ICD-10-CM

## 2016-09-30 DIAGNOSIS — Z87891 Personal history of nicotine dependence: Secondary | ICD-10-CM

## 2016-09-30 LAB — BASIC METABOLIC PANEL
ANION GAP: 3 — AB (ref 5–15)
BUN: 19 mg/dL (ref 6–20)
CALCIUM: 8 mg/dL — AB (ref 8.9–10.3)
CO2: 26 mmol/L (ref 22–32)
Chloride: 103 mmol/L (ref 101–111)
Creatinine, Ser: 0.79 mg/dL (ref 0.44–1.00)
GLUCOSE: 137 mg/dL — AB (ref 65–99)
Potassium: 4 mmol/L (ref 3.5–5.1)
Sodium: 132 mmol/L — ABNORMAL LOW (ref 135–145)

## 2016-09-30 LAB — ECHOCARDIOGRAM COMPLETE
HEIGHTINCHES: 63 in
WEIGHTICAEL: 2336 [oz_av]

## 2016-09-30 LAB — CBC
HCT: 26.6 % — ABNORMAL LOW (ref 36.0–46.0)
HEMOGLOBIN: 8.7 g/dL — AB (ref 12.0–15.0)
MCH: 34.1 pg — ABNORMAL HIGH (ref 26.0–34.0)
MCHC: 32.7 g/dL (ref 30.0–36.0)
MCV: 104.3 fL — ABNORMAL HIGH (ref 78.0–100.0)
Platelets: 247 10*3/uL (ref 150–400)
RBC: 2.55 MIL/uL — ABNORMAL LOW (ref 3.87–5.11)
RDW: 16 % — ABNORMAL HIGH (ref 11.5–15.5)
WBC: 3.5 10*3/uL — ABNORMAL LOW (ref 4.0–10.5)

## 2016-09-30 LAB — TSH: TSH: 0.939 u[IU]/mL (ref 0.350–4.500)

## 2016-09-30 MED ORDER — IOPAMIDOL (ISOVUE-300) INJECTION 61%
INTRAVENOUS | Status: AC
  Administered 2016-09-30: 30 mL
  Filled 2016-09-30: qty 30

## 2016-09-30 MED ORDER — IOPAMIDOL (ISOVUE-300) INJECTION 61%: 75.0000 mL | Freq: Once | INTRAVENOUS | Status: DC | PRN

## 2016-09-30 MED ORDER — ATORVASTATIN CALCIUM 10 MG PO TABS
20.0000 mg | ORAL_TABLET | Freq: Every day | ORAL | Status: DC
  Administered 2016-09-30 – 2016-10-02 (×3): 20 mg via ORAL
  Filled 2016-09-30 (×3): qty 2

## 2016-09-30 MED ORDER — FUROSEMIDE 40 MG PO TABS
40.0000 mg | ORAL_TABLET | Freq: Every day | ORAL | Status: DC
  Administered 2016-09-30 – 2016-10-03 (×4): 40 mg via ORAL
  Filled 2016-09-30 (×4): qty 1

## 2016-09-30 NOTE — Patient Outreach (Signed)
[facility] Care Management  09/30/2016  Carrie Pena September 29, 1928 [id number]  CSW was scheduled to meet with [facility] patient currently resides to receive short-term rehabilitative services; however, CSW noted that patient [facility], [facility] with shortness of breath, cough and wheezing.  Patient was noted to be at 92% oxygen saturation level on 5 Liters.  CSW will continue to follow patient while hospitalized and then reschedule a routine follow-up appointment with patient, once [facility]. Danford BadJoanna Saporito, BSW, MSW, LCSW  Licensed Restaurant manager, fast foodClinical Social Worker  [facility] Care Management [facility]  Mailing [place], [place], [place] [address] Physical Address-[address], [place], [place] [address] Toll Free Main # [phone] Fax # [phone] Cell # [phone]  Fax # [phone]  [email or web address]

## 2016-09-30 NOTE — Progress Notes (Signed)
  Echocardiogram 2D Echocardiogram has been performed.  Nolon RodBrown, Tony 09/30/2016, 2:56 PM

## 2016-09-30 NOTE — Progress Notes (Signed)
CSW met with Carrie Pena to assist with d/c planning. Carrie Pena recently d/c from [place] ( 09/13/16 ) to [facility] for [place]. Carrie Pena reports that she like to return to [facility] at d/c to continue her rehab. Clinicals sent to SNF for review. Carrie Pena is able to accept Carrie Pena tis weekend if stable for d/c.   Carrie Pena seen by psych for capacity. Psych has determined that Carrie Pena does have capacity and is able to participate in decision making.  CSW will continue to follow to assist with d/c planning back to [facility] when stable.  Werner Lean LCSW [phone]

## 2016-09-30 NOTE — Progress Notes (Signed)
The order for simvastatin(Zocor) was changed to an equivalent dose of atorvastatin(Lipitor) due to the potential drug interaction with Amlodipine.  When taken in combination with medications that inhibit its metabolism, simvastatin can accumulate which increases the risk of liver toxicity, myopathy, or rhabdomyolysis.  Simvastatin dose should not exceed 20mg /day in patients taking amlodipine, ranolazine or amiodarone.   Please consider this potential interaction at discharge.  Bernadene Personrew Chonita Gadea, PharmD, BCPS Pager: [phone] 09/30/2016, 2:04 PM

## 2016-09-30 NOTE — Consult Note (Signed)
[facility] Consult   Reason for Consult:  capacity Referring Physician:  Dr. Clementeen Graham Patient Identification: Carrie Pena MRN:  [id number] Principal Diagnosis: COPD with acute exacerbation [facility]) Diagnosis:   Patient Active Problem List   Diagnosis Date Noted  . COPD (chronic obstructive pulmonary disease) ([place]) [J44.9] 09/29/2016  . Chronic diastolic heart failure ([place]) [I50.32] 09/29/2016  . Acute diastolic heart failure ([place]) [I50.31] 09/06/2016  . AKI (acute kidney injury) ([place]) [N17.9] 09/06/2016  . Iliopsoas muscle hematoma, left, initial encounter [S70.12XA] 09/01/2016  . Intractable pain [R52] 09/01/2016  . Macrocytic anemia [D53.9] 09/01/2016  . Essential hypertension, benign [I10] 08/22/2015  . COPD with acute exacerbation ([place]) [J44.1] 08/12/2015  . Respiratory failure ([place]) [J96.90] 08/12/2015  . CAP (community acquired pneumonia) [J18.9] 08/12/2015  . Pain of right lower extremity [M79.604] 05/06/2014  . Swelling of limb [M79.89] 05/06/2014  . Atherosclerosis of native arteries of the extremities with intermittent claudication [I70.219] 07/18/2011    Total Time spent with patient: 1 hour  Subjective:   Carrie Pena is a 82 y.o. female patient admitted with worsening productive cough and dyspnea.Marland Kitchen  HPI:  Carrie Pena is a 82 y.o. female, Seen, chart reviewed and case discussed with the LCSW. Patient reported her family brought her to the [facility] emergency department for chronic medical problems and then she was sent to [facility] skilled nursing facility where she has been for 3 weeks before they brought her here for unknown medical complaints. Patient denies symptoms of depression, anxiety, psychosis. Patient does not have suicidal/homicidal ideation, intention or plans. Patient reported she has been wheelchair bound secondary to severe arthritis. Patient has intact cognitions including orientation, concentration, memory, and general fund of  knowledge.   Medical history: Patient  with medical history significant of diastolic heart failure, COPD, hypertension, iliopsoas muscle hematoma, macrocytic anemia. Patient has had about 3 days of worsening productive cough and dyspnea. She has required oxygen for the last two days which is new. Oxygen has helped with symptoms. She reports no associated wheezing. She is not ambulatory at this time. No aggravating factors.  ED Course: Vitals: Afebrile, normal pulse, respirations in high 10s, low 20s. On 4L [place]. Slightly hypertensive. Labs: Sodium of 134. Calcium of 8.9 Imaging: CXR with no acute process, tortuous and stable aorta Medications/Course: Duoneb and solumedrol  Past Psychiatric History: Patient has no history of psychiatric illness.  Risk to Self: Is patient at risk for suicide?: No Risk to Others:   Prior Inpatient Therapy:   Prior Outpatient Therapy:    Past Medical History:  Past Medical History:  Diagnosis Date  . Carpal tunnel syndrome   . COPD (chronic obstructive pulmonary disease) ([place])   . GERD (gastroesophageal reflux disease)   . Hyperlipidemia   . Hypertension   . Osteoarthritis     Past Surgical History:  Procedure Laterality Date  . KNEE ARTHROSCOPY    . left carpal tunnel    . TONSILLECTOMY     Family History:  Family History  Problem Relation Age of Onset  . Cancer Mother   . Cancer Father   . Hypertension Daughter   . Heart disease Daughter     before age 108   Family Psychiatric  History: Patient has no family history of mental illness. Social History:  History  Alcohol Use No     History  Drug Use No    Social History   Social History  . Marital status: Widowed    Spouse name:  N/A  . Number of children: N/A  . Years of education: N/A   Social History Main Topics  . Smoking status: Former Smoker    Packs/day: 0.50    Types: Cigarettes    Quit date: 03/12/2015  . Smokeless tobacco: Never Used  . Alcohol use No  . Drug use: No   . Sexual activity: Not Asked   Other Topics Concern  . None   Social History Narrative  . None   Additional Social History:    Allergies:   Allergies  Allergen Reactions  . Tylenol [Acetaminophen] Other (See Comments)    Reaction:  Shaking     Labs:  Results for orders placed or performed during the hospital encounter of 09/29/16 (from the past 48 hour(s))  Urinalysis, Routine w reflex microscopic     Status: Abnormal   Collection Time: 09/29/16 12:40 PM  Result Value Ref Range   Color, Urine YELLOW YELLOW   APPearance HAZY (A) CLEAR   Specific Gravity, Urine 1.010 1.005 - 1.030   pH 5.0 5.0 - 8.0   Glucose, UA NEGATIVE NEGATIVE mg/dL   Hgb urine dipstick SMALL (A) NEGATIVE   Bilirubin Urine NEGATIVE NEGATIVE   Ketones, ur 5 (A) NEGATIVE mg/dL   Protein, ur NEGATIVE NEGATIVE mg/dL   Nitrite NEGATIVE NEGATIVE   Leukocytes, UA NEGATIVE NEGATIVE   RBC / HPF 0-5 0 - 5 RBC/hpf   WBC, UA 0-5 0 - 5 WBC/hpf   Bacteria, UA NONE SEEN NONE SEEN   Squamous Epithelial / LPF 0-5 (A) NONE SEEN   Mucous PRESENT    Hyaline Casts, UA PRESENT   CBC with Differential/Platelet     Status: Abnormal   Collection Time: 09/29/16  2:19 PM  Result Value Ref Range   WBC 5.6 4.0 - 10.5 K/uL   RBC 2.80 (L) 3.87 - 5.11 MIL/uL   Hemoglobin 9.6 (L) 12.0 - 15.0 g/dL   HCT 28.7 (L) 36.0 - 46.0 %   MCV 102.5 (H) 78.0 - 100.0 fL   MCH 34.3 (H) 26.0 - 34.0 pg   MCHC 33.4 30.0 - 36.0 g/dL   RDW 16.1 (H) 11.5 - 15.5 %   Platelets 259 150 - 400 K/uL   Neutrophils Relative % 84 %   Neutro Abs 4.7 1.7 - 7.7 K/uL   Lymphocytes Relative 11 %   Lymphs Abs 0.6 (L) 0.7 - 4.0 K/uL   Monocytes Relative 5 %   Monocytes Absolute 0.3 0.1 - 1.0 K/uL   Eosinophils Relative 0 %   Eosinophils Absolute 0.0 0.0 - 0.7 K/uL   Basophils Relative 0 %   Basophils Absolute 0.0 0.0 - 0.1 K/uL  Comprehensive metabolic panel     Status: Abnormal   Collection Time: 09/29/16  2:19 PM  Result Value Ref Range   Sodium  134 (L) 135 - 145 mmol/L   Potassium 3.7 3.5 - 5.1 mmol/L   Chloride 102 101 - 111 mmol/L   CO2 23 22 - 32 mmol/L   Glucose, Bld 97 65 - 99 mg/dL   BUN 14 6 - 20 mg/dL   Creatinine, Ser 0.89 0.44 - 1.00 mg/dL   Calcium 8.2 (L) 8.9 - 10.3 mg/dL   Total Protein 7.6 6.5 - 8.1 g/dL   Albumin 3.1 (L) 3.5 - 5.0 g/dL   AST 27 15 - 41 U/L   ALT 14 14 - 54 U/L   Alkaline Phosphatase 88 38 - 126 U/L   Total Bilirubin 0.8 0.3 - 1.2  mg/dL   GFR calc non Af Amer 57 (L) >60 mL/min   GFR calc Af Amer >60 >60 mL/min    Comment: (NOTE) The eGFR has been calculated using the CKD EPI equation. This calculation has not been validated in all clinical situations. eGFR's persistently <60 mL/min signify possible Chronic Kidney Disease.    Anion gap 9 5 - 15  Brain natriuretic peptide     Status: None   Collection Time: 09/29/16  2:19 PM  Result Value Ref Range   B Natriuretic Peptide 38.3 0.0 - 100.0 pg/mL  I-Stat CG4 Lactic Acid, ED     Status: None   Collection Time: 09/29/16  2:31 PM  Result Value Ref Range   Lactic Acid, Venous 1.38 0.5 - 1.9 mmol/L  I-stat troponin, ED     Status: None   Collection Time: 09/29/16  2:33 PM  Result Value Ref Range   Troponin i, poc 0.01 0.00 - 0.08 ng/mL   Comment 3            Comment: Due to the release kinetics of cTnI, a negative result within the first hours of the onset of symptoms does not rule out myocardial infarction with certainty. If myocardial infarction is still suspected, repeat the test at appropriate intervals.   MRSA PCR Screening     Status: None   Collection Time: 09/29/16  7:03 PM  Result Value Ref Range   MRSA by PCR NEGATIVE NEGATIVE    Comment:        The GeneXpert MRSA Assay (FDA approved for NASAL specimens only), is one component of a comprehensive MRSA colonization surveillance program. It is not intended to diagnose MRSA infection nor to guide or monitor treatment for MRSA infections.   Basic metabolic panel      Status: Abnormal   Collection Time: 09/30/16  4:11 AM  Result Value Ref Range   Sodium 132 (L) 135 - 145 mmol/L   Potassium 4.0 3.5 - 5.1 mmol/L   Chloride 103 101 - 111 mmol/L   CO2 26 22 - 32 mmol/L   Glucose, Bld 137 (H) 65 - 99 mg/dL   BUN 19 6 - 20 mg/dL   Creatinine, Ser 0.79 0.44 - 1.00 mg/dL   Calcium 8.0 (L) 8.9 - 10.3 mg/dL   GFR calc non Af Amer >60 >60 mL/min   GFR calc Af Amer >60 >60 mL/min    Comment: (NOTE) The eGFR has been calculated using the CKD EPI equation. This calculation has not been validated in all clinical situations. eGFR's persistently <60 mL/min signify possible Chronic Kidney Disease.    Anion gap 3 (L) 5 - 15  CBC     Status: Abnormal   Collection Time: 09/30/16  4:11 AM  Result Value Ref Range   WBC 3.5 (L) 4.0 - 10.5 K/uL   RBC 2.55 (L) 3.87 - 5.11 MIL/uL   Hemoglobin 8.7 (L) 12.0 - 15.0 g/dL   HCT 26.6 (L) 36.0 - 46.0 %   MCV 104.3 (H) 78.0 - 100.0 fL   MCH 34.1 (H) 26.0 - 34.0 pg   MCHC 32.7 30.0 - 36.0 g/dL   RDW 16.0 (H) 11.5 - 15.5 %   Platelets 247 150 - 400 K/uL    Current Facility-Administered Medications  Medication Dose Route Frequency Provider Last Rate Last Dose  . amLODipine (NORVASC) tablet 10 mg  10 mg Oral Daily Mariel Aloe, MD      . aspirin EC tablet 81 mg  81 mg Oral Daily Mariel Aloe, MD      . cilostazol (PLETAL) tablet 100 mg  100 mg Oral BID Mariel Aloe, MD   100 mg at 09/29/16 2224  . enoxaparin (LOVENOX) injection 40 mg  40 mg Subcutaneous Q24H Mariel Aloe, MD   40 mg at 09/29/16 2225  . feeding supplement (ENSURE ENLIVE) (ENSURE ENLIVE) liquid 237 mL  237 mL Oral BID BM Mariel Aloe, MD      . ipratropium-albuterol (DUONEB) 0.5-2.5 (3) MG/3ML nebulizer solution 3 mL  3 mL Nebulization Q4H PRN Mariel Aloe, MD      . mometasone-formoterol [facility]) 200-5 MCG/ACT inhaler 2 puff  2 puff Inhalation BID Mariel Aloe, MD   2 puff at 09/30/16 [phone]  . oxyCODONE (Oxy IR/ROXICODONE) immediate release tablet  5 mg  5 mg Oral Q4H PRN Mariel Aloe, MD      . polyethylene glycol (MIRALAX / GLYCOLAX) packet 17 g  17 g Oral BID Mariel Aloe, MD   17 g at 09/29/16 2224  . predniSONE (DELTASONE) tablet 40 mg  40 mg Oral Q breakfast Mariel Aloe, MD      . senna-docusate (Senokot-S) tablet 2 tablet  2 tablet Oral BID Mariel Aloe, MD   2 tablet at 09/29/16 2224  . simvastatin (ZOCOR) tablet 40 mg  40 mg Oral q1800 Mariel Aloe, MD        Musculoskeletal: Strength & Muscle Tone: decreased Gait & Station: unable to stand Patient leans: Patient is wheelchair-bound  Psychiatric Specialty Exam: Physical Exam as per history and physical   ROS patient has denied nausea, vomiting, abdominal pain, shortness of breath and chest pain No Fever-chills, No Headache, No changes with Vision or hearing, reports vertigo No problems swallowing food or Liquids, No Chest pain, Cough or Shortness of Breath, No Abdominal pain, No Nausea or Vommitting, Bowel movements are regular, No Blood in stool or Urine, No dysuria, No new skin rashes or bruises, No new joints pains-aches,  No new weakness, tingling, numbness in any extremity, No recent weight gain or loss, No polyuria, polydypsia or polyphagia,   A full 10 point Review of Systems was done, except as stated above, all other Review of Systems were negative.   Blood pressure 130/66, pulse 81, temperature 98 F (36.7 C), temperature source Oral, resp. rate 18, height 5' 3"  (1.6 m), weight 66.2 kg (146 lb), SpO2 (!) 89 %.Body mass index is 25.86 kg/m.  Psychiatric Specialty Exam: Physical Exam as per history and physical   ROS denied symptoms of depression, anxiety and psychosis. Patient is willing to follow with current medications and outpatient ligation management. Patient has chest pain on arrival which has been controlled with medication treatment. No Fever-chills, No Headache, No changes with Vision or hearing, reports vertigo No problems  swallowing food or Liquids, No Chest pain, Cough or Shortness of Breath, No Abdominal pain, No Nausea or Vommitting, Bowel movements are regular, No Blood in stool or Urine, No dysuria, No new skin rashes or bruises, No new joints pains-aches,  No new weakness, tingling, numbness in any extremity, No recent weight gain or loss, No polyuria, polydypsia or polyphagia,   A full 10 point Review of Systems was done, except as stated above, all other Review of Systems were negative.  Blood pressure (!) 142/74, pulse 78, temperature 98.3 F (36.8 C), temperature source Oral, resp. rate 16, height 5' 4"  (1.626 m), weight 106.9 kg (235  lb 9.6 oz), SpO2 96 %.Body mass index is 40.44 kg/m.  General Appearance: Casual, Morbidly obese   Eye Contact:  Good  Speech:  Clear and Coherent  Volume:  Normal  Mood:  Depressed  Affect:  Appropriate and Congruent  Thought Process:  Coherent and Goal Directed  Orientation:  Full (Time, Place, and Person)  Thought Content:  WDL  Suicidal Thoughts:  No  Homicidal Thoughts:  No  Memory:  Immediate;   Good Recent;   Fair Remote;   Fair  Judgement:  Intact  Insight:  Good  Psychomotor Activity:  Decreased  Concentration:  Concentration: Good and Attention Span: Good  Recall:  Good  Fund of Knowledge:  Good  Language:  Good  Akathisia:  Yes  Handed:  Right  AIMS (if indicated):     Assets:  Communication Skills Desire for Improvement Financial Resources/Insurance Housing Leisure Time Resilience Social Support Transportation  ADL's:  Intact  Cognition:  WNL  Sleep:            Treatment Plan Summary: 82 years old female with multiple medical problems and has no previous history of this mental illness presented to [facility] with a productive cough and dyspnea.   Daily contact with patient to assess and evaluate symptoms and progress in treatment and Medication management  Based on my evaluation patient meets criteria for  capacity to make her own medical decisions and living arrangements. Patient is willing to go back to the [facility] skilled nursing facility as she has not help at home And her family was far away from [place]. Appreciate psychiatric consultation and we sign off as of today Please contact [phone] or [phone] if needs further assistance  Disposition: Supportive therapy provided about ongoing stressors.  Ambrose Finland, MD 09/30/2016 10:25 AM

## 2016-09-30 NOTE — Progress Notes (Signed)
Nutrition Brief Note  Patient identified on the Malnutrition Screening Tool (MST) Report and COPD Gold Protocol  Wt Readings from Last 15 Encounters:  09/29/16 146 lb (66.2 kg)  09/13/16 146 lb (66.2 kg)  09/07/16 146 lb (66.2 kg)  09/06/16 149 lb 14.6 oz (68 kg)  09/04/15 130 lb 12.8 oz (59.3 kg)  08/21/15 132 lb 6.4 oz (60.1 kg)  08/18/15 129 lb 6.4 oz (58.7 kg)  08/12/15 128 lb 11.2 oz (58.4 kg)  05/22/14 145 lb (65.8 kg)  05/06/14 145 lb (65.8 kg)  07/18/11 145 lb (65.8 kg)   Spoke with patient at bedside. She reports her appetite is the same now as at baseline. [facility] She reports UBW was 130 lbs but in the past year she has gained to about 145 lbs - verified in chart. Denies N/V, abdominal pain, or constipation/diarrhea. Encouraged patient to continue eating adequate calories and protein with meals to prevent weight loss. She has no nutrition questions at this time.  Body mass index is 25.86 kg/m. Patient meets criteria for Normal Weight based on current BMI.   Current diet order is Heart Healthy, patient is consuming approximately 75% of meals at this time. Labs and medications reviewed.   No nutrition interventions warranted at this time. If nutrition issues arise, please consult RD.   Helane RimaLeanne Kemia Wendel, MS, RD, LDN Pager: [phone] After Hours Pager: [phone]

## 2016-09-30 NOTE — Progress Notes (Signed)
PROGRESS NOTE                                                                                                                                                                                                             Patient Demographics:    Carrie Pena, is a 82 y.o. female, DOB - [birth year], ZOX:[id number]  Admit date - 09/29/2016   Admitting Physician Narda Bonds, MD  Outpatient Primary MD for the patient is Thayer Headings, MD  LOS - 1  Outpatient Specialists:none  Chief Complaint  Patient presents with  . Shortness of Breath       Brief Narrative  82 year old female with diastolic CHF, COPD not on home O2, hypertension,  GERD, PAD, recent ileo psoas muscle hematoma after sustaining fall at home. She was monitored in the hospital and discharged to SNF. Patient reportedly had fallen at SNF few days back and landed on her left side. For past 3 days she was also having worsening productive cough with increased dyspnea requiring oxygen. Patient sent to the ED for these symptoms.  In the ED she was afebrile, requiring 4 L via nasal cannula and hypertensive. Blood work was unremarkable. Chest x-ray was negative for infiltrate. Admitted to hospitalist service for COPD exacerbation.     Subjective:   Patient denies any specific symptoms. Daughter at bedside concerned about her frequent falls and progressive dementia.   Assessment  & Plan :    Principal Problem:   COPD with acute exacerbation (HCC) Mild symptoms. Received Solu-Medrol in the ED, transitioned to oral prednisone. Continue DuoNeb's and home inhaler. Supportive care with antitussives. Titrated oxygen requirement.  Active Problems:  Fall with left quadrant discomfort. Nontender on exam. Check CT of the abdomen and pelvis with contrast to rule out intra-abdominal injury.  Progressive dementia Patient's daughter and granddaughter concerned that she  gets increasingly forgetful and unable to keep track of her medications. This has been progressive or past several months. Psych consulted to assess capacity. Recent B12 was normal. TSH normal. Check HIV and RPR.  Acute on chronic diastolic CHF Noted for increased leg swellings (this is new finding as per family). I will add daily Lasix. Check 2-D echo.    Essential hypertension, benign Stable. Continue home medications.    Iliopsoas muscle hematoma, left,  Will evaluate with CT scan.  Weakness with  frequent falls PT evaluation.      Code Status : DO NOT RESUSCITATE  Family Communication  : Daughter at bedside  Disposition Plan  : Return to SNF possibly in the next 24- 48 hours if breathing better  Barriers For Discharge : Active symptoms  Consults  :  none  Procedures  :  CT abd and pelvis  DVT Prophylaxis  :  Lovenox -   Lab Results  Component Value Date   PLT 247 09/30/2016    Antibiotics  :   Anti-infectives    None        Objective:   Vitals:   09/29/16 1951 09/29/16 2054 09/30/16 0524 09/30/16 0856  BP:  119/64 130/66   Pulse: 88 84 81   Resp: 20 18 18    Temp:  98.1 F (36.7 C) 98 F (36.7 C)   TempSrc:  Oral Oral   SpO2: 93% 96% 96% (!) 89%  Weight:      Height:        Wt Readings from Last 3 Encounters:  09/29/16 66.2 kg (146 lb)  09/13/16 66.2 kg (146 lb)  09/07/16 66.2 kg (146 lb)     Intake/Output Summary (Last 24 hours) at 09/30/16 1155 Last data filed at 09/30/16 0816  Gross per 24 hour  Intake              240 ml  Output                0 ml  Net              240 ml     Physical Exam  Gen: not in distress HEENT: moist mucosa, supple neck Chest: Scattered rhonchi over right lung base CVS: N S1&S2, no murmurs, rubs or gallop GI: soft, NT, mild bulging over left upper quadrant, nontender, bowel sounds present Musculoskeletal: warm, trace pitting edema bilaterally CNS: Alert and oriented    Data Review:     CBC  Recent Labs Lab 09/29/16 1419 09/30/16 0411  WBC 5.6 3.5*  HGB 9.6* 8.7*  HCT 28.7* 26.6*  PLT 259 247  MCV 102.5* 104.3*  MCH 34.3* 34.1*  MCHC 33.4 32.7  RDW 16.1* 16.0*  LYMPHSABS 0.6*  --   MONOABS 0.3  --   EOSABS 0.0  --   BASOSABS 0.0  --     Chemistries   Recent Labs Lab 09/29/16 1419 09/30/16 0411  NA 134* 132*  K 3.7 4.0  CL 102 103  CO2 23 26  GLUCOSE 97 137*  BUN 14 19  CREATININE 0.89 0.79  CALCIUM 8.2* 8.0*  AST 27  --   ALT 14  --   ALKPHOS 88  --   BILITOT 0.8  --    ------------------------------------------------------------------------------------------------------------------ No results for input(s): CHOL, HDL, LDLCALC, TRIG, CHOLHDL, LDLDIRECT in the last 72 hours.  No results found for: HGBA1C ------------------------------------------------------------------------------------------------------------------  Recent Labs  09/30/16 1002  TSH 0.939   ------------------------------------------------------------------------------------------------------------------ No results for input(s): VITAMINB12, FOLATE, FERRITIN, TIBC, IRON, RETICCTPCT in the last 72 hours.  Coagulation profile No results for input(s): INR, PROTIME in the last 168 hours.  No results for input(s): DDIMER in the last 72 hours.  Cardiac Enzymes No results for input(s): CKMB, TROPONINI, MYOGLOBIN in the last 168 hours.  Invalid input(s): CK ------------------------------------------------------------------------------------------------------------------    Component Value Date/Time   BNP 38.3 09/29/2016 1419    Inpatient Medications  Scheduled Meds: . amLODipine  10 mg Oral Daily  . aspirin  EC  81 mg Oral Daily  . cilostazol  100 mg Oral BID  . enoxaparin (LOVENOX) injection  40 mg Subcutaneous Q24H  . feeding supplement (ENSURE ENLIVE)  237 mL Oral BID BM  . mometasone-formoterol  2 puff Inhalation BID  . polyethylene glycol  17 g Oral BID  .  predniSONE  40 mg Oral Q breakfast  . senna-docusate  2 tablet Oral BID  . simvastatin  40 mg Oral q1800   Continuous Infusions: PRN Meds:.ipratropium-albuterol, oxyCODONE  Micro Results Recent Results (from the past 240 hour(s))  MRSA PCR Screening     Status: None   Collection Time: 09/29/16  7:03 PM  Result Value Ref Range Status   MRSA by PCR NEGATIVE NEGATIVE Final    Comment:        The GeneXpert MRSA Assay (FDA approved for NASAL specimens only), is one component of a comprehensive MRSA colonization surveillance program. It is not intended to diagnose MRSA infection nor to guide or monitor treatment for MRSA infections.     Radiology Reports Dg Chest 2 View  Result Date: 09/29/2016 CLINICAL DATA: Chest pain EXAM: CHEST  2 VIEW COMPARISON:  September 01, 2016 FINDINGS: There is slight scarring in the right base. There is no edema or consolidation. Heart size and pulmonary vascularity are normal. No adenopathy. There is atherosclerotic calcification in the aorta. Aorta is tortuous but stable. There is arthropathy in each shoulder. IMPRESSION: No edema or consolidation. Slight scarring right base. Stable cardiac silhouette. There aorta is tortuous with aortic atherosclerosis. Electronically Signed   By: Bretta Bang III M.D.   On: 09/29/2016 13:30   Dg Chest 2 View  Result Date: 09/01/2016 CLINICAL DATA:  Shortness of breath and cough for 1 day. EXAM: CHEST  2 VIEW COMPARISON:  08/14/2015 FINDINGS: The cardiac silhouette, mediastinal and hilar contours are within normal limits and stable. Stable tortuosity and calcification of the thoracic aorta. Chronic lung changes but no acute pulmonary findings. No pleural effusion or pneumothorax. Stable advanced degenerative changes involving both shoulders. IMPRESSION: No acute cardiopulmonary findings. Electronically Signed   By: Rudie Meyer M.D.   On: 09/01/2016 20:21   Ct Hip Left Wo Contrast  Result Date:  09/01/2016 CLINICAL DATA:  Fall with left hip pain last night. Difficulty bearing weight. EXAM: CT OF THE LEFT HIP WITHOUT CONTRAST TECHNIQUE: Multidetector CT imaging of the left hip was performed according to the standard protocol. Multiplanar CT image reconstructions were also generated. COMPARISON:  09/01/2016 FINDINGS: Bones/Joint/Cartilage Mild to moderate degenerative chondral thinning in the left hip without visible fracture or hip effusion. Ligaments Suboptimally assessed by CT. Muscles and Tendons Mild thickening and increased density distally in the iliopsoas musculotendinous junction compatible with tear or hematoma. I do not see well-defined discontinuity in the distal iliopsoas tendon. Soft tissues Left groin hernia contains a loop of small bowel without strangulation. IMPRESSION: 1. Abnormal high density in the distal iliopsoas muscle and tendon, suspicious for hematoma, less likely tendon rupture. 2. Left groin hernia containing a loop of small bowel without strangulation or obstruction. 3. Mild to moderate degenerative chondral thinning in the left hip. Electronically Signed   By: Gaylyn Rong M.D.   On: 09/01/2016 12:43   Dg Hip Unilat W Or Wo Pelvis 2-3 Views Left  Result Date: 09/01/2016 CLINICAL DATA:  Pain following fall EXAM: DG HIP (WITH OR WITHOUT PELVIS) 2-3V LEFT COMPARISON:  None. FINDINGS: Frontal pelvis as well as frontal and lateral left hip images were obtained.  No evident fracture or dislocation. There is mild symmetric narrowing of both hip joints. No erosive change. There are foci of calcification in both common femoral and superficial femoral arteries. IMPRESSION: Mild symmetric narrowing of both hip joints. No fracture dislocation. Multifocal arterial atherosclerosis. Electronically Signed   By: Bretta BangWilliam  Woodruff III M.D.   On: 09/01/2016 11:40    Time Spent in minutes  25   Eddie NorthHUNGEL, Guerry Covington M.D on 09/30/2016 at 11:55 AM  Between 7am to 7pm - Pager -  [phone]  After 7pm go to [email or web address] - password [email or web address]  [facility] -  Office  [phone]

## 2016-09-30 NOTE — [place] FL2 (Signed)
[place] MEDICAID FL2 LEVEL OF CARE SCREENING TOOL     IDENTIFICATION  Patient Name: Carrie Pena Birthdate: 10-Apr-1928 Sex: female Admission Date (Current Location): 09/29/2016  [facility] [place]   Facility and Address:  [facility],  [address], [place] [id number]      Provider Number: [id number]  Attending Physician Name and Address:  Eddie NorthNishant Dhungel, MD  Relative Name and Phone Number:       Current Level of Care: Hospital Recommended Level of Care: Skilled Nursing Facility Prior Approval Number:    Date Approved/Denied:   PASRR Number: [id number]  Discharge Plan: SNF    Current Diagnoses: Patient Active Problem List   Diagnosis Date Noted  . COPD (chronic obstructive pulmonary disease) (HCC) 09/29/2016  . Chronic diastolic heart failure (HCC) 09/29/2016  . Acute diastolic heart failure (HCC) 09/06/2016  . AKI (acute kidney injury) (HCC) 09/06/2016  . Iliopsoas muscle hematoma, left, initial encounter 09/01/2016  . Intractable pain 09/01/2016  . Macrocytic anemia 09/01/2016  . Essential hypertension, benign 08/22/2015  . COPD with acute exacerbation (HCC) 08/12/2015  . Respiratory failure (HCC) 08/12/2015  . CAP (community acquired pneumonia) 08/12/2015  . Pain of right lower extremity 05/06/2014  . Swelling of limb 05/06/2014  . Atherosclerosis of native arteries of the extremities with intermittent claudication 07/18/2011    Orientation RESPIRATION BLADDER Height & Weight     Self, Time, Situation, Place  Normal Incontinent Weight: 146 lb (66.2 kg) Height:  5\' 3"  (160 cm)  BEHAVIORAL SYMPTOMS/MOOD NEUROLOGICAL BOWEL NUTRITION STATUS  Other (Comment) (no behaviors)   Continent Diet  AMBULATORY STATUS COMMUNICATION OF NEEDS Skin   Extensive Assist Verbally Normal                       Personal Care Assistance Level of Assistance  Bathing, Feeding, Dressing Bathing Assistance: Maximum assistance Feeding  assistance: Independent Dressing Assistance: Maximum assistance     Functional Limitations Info  Sight, Hearing, Speech Sight Info: Adequate Hearing Info: Impaired Speech Info: Adequate    SPECIAL CARE FACTORS FREQUENCY  PT (By licensed PT), OT (By licensed OT)     PT Frequency: 5x wk OT Frequency: 5x wk            Contractures Contractures Info: Not present    Additional Factors Info  Code Status Code Status Info: Full Code             Current Medications (09/30/2016):  This is the current hospital active medication list Current Facility-Administered Medications  Medication Dose Route Frequency Provider Last Rate Last Dose  . amLODipine (NORVASC) tablet 10 mg  10 mg Oral Daily Narda Bondsalph A Nettey, MD   10 mg at 09/30/16 1025  . aspirin EC tablet 81 mg  81 mg Oral Daily Narda Bondsalph A Nettey, MD   81 mg at 09/30/16 1025  . atorvastatin (LIPITOR) tablet 20 mg  20 mg Oral q1800 Danford Badrew A Wofford, RPH      . cilostazol (PLETAL) tablet 100 mg  100 mg Oral BID Narda Bondsalph A Nettey, MD   100 mg at 09/30/16 1025  . enoxaparin (LOVENOX) injection 40 mg  40 mg Subcutaneous Q24H Narda Bondsalph A Nettey, MD   40 mg at 09/29/16 2225  . feeding supplement (ENSURE ENLIVE) (ENSURE ENLIVE) liquid 237 mL  237 mL Oral BID BM Narda Bondsalph A Nettey, MD   237 mL at 09/30/16 1025  . furosemide (LASIX) tablet 40 mg  40 mg  Oral Daily Nishant Dhungel, MD      . ipratropium-albuterol (DUONEB) 0.5-2.5 (3) MG/3ML nebulizer solution 3 mL  3 mL Nebulization Q4H PRN Narda Bondsalph A Nettey, MD      . mometasone-formoterol [facility](DULERA) 200-5 MCG/ACT inhaler 2 puff  2 puff Inhalation BID Narda Bondsalph A Nettey, MD   2 puff at 09/30/16 [phone]  . oxyCODONE (Oxy IR/ROXICODONE) immediate release tablet 5 mg  5 mg Oral Q4H PRN Narda Bondsalph A Nettey, MD   5 mg at 09/30/16 1025  . polyethylene glycol (MIRALAX / GLYCOLAX) packet 17 g  17 g Oral BID Narda Bondsalph A Nettey, MD   17 g at 09/30/16 1025  . predniSONE (DELTASONE) tablet 40 mg  40 mg Oral Q breakfast Narda Bondsalph A Nettey, MD   40  mg at 09/30/16 1025  . senna-docusate (Senokot-S) tablet 2 tablet  2 tablet Oral BID Narda Bondsalph A Nettey, MD   2 tablet at 09/30/16 1025     Discharge Medications: Please see discharge summary for a list of discharge medications.  Relevant Imaging Results:  Relevant Lab Results:   Additional Information SS# [id number]  Brynn Mulgrew, Dickey GaveJamie Lee, LCSW

## 2016-10-01 DIAGNOSIS — I714 Abdominal aortic aneurysm, without rupture: Secondary | ICD-10-CM

## 2016-10-01 LAB — RPR: RPR Ser Ql: NONREACTIVE

## 2016-10-01 LAB — HIV ANTIBODY (ROUTINE TESTING W REFLEX): HIV SCREEN 4TH GENERATION: NONREACTIVE

## 2016-10-01 NOTE — Progress Notes (Signed)
PROGRESS NOTE                                                                                                                                                                                                             Patient Demographics:    Carrie Pena, is a 82 y.o. female, DOB - [birth year], ZOX:[id number]  Admit date - 09/29/2016   Admitting Physician Narda Bonds, MD  Outpatient Primary MD for the patient is Thayer Headings, MD  LOS - 2  Outpatient Specialists:none  Chief Complaint  Patient presents with  . Shortness of Breath       Brief Narrative  82 year old female with diastolic CHF, COPD not on home O2, hypertension,  GERD, PAD, recent ileo psoas muscle hematoma after sustaining fall at home. She was monitored in the hospital and discharged to SNF. Patient reportedly had fallen at SNF few days back and landed on her left side. For past 3 days she was also having worsening productive cough with increased dyspnea requiring oxygen. Patient sent to the ED for these symptoms.  In the ED she was afebrile, requiring 4 L via nasal cannula and hypertensive. Blood work was unremarkable. Chest x-ray was negative for infiltrate. Admitted to hospitalist service for COPD exacerbation.     Subjective:   C/o some abd discomfort    Assessment  & Plan :    Principal Problem:   COPD with acute exacerbation (HCC) Mild symptoms. Received Solu-Medrol in the ED, transitioned to oral prednisone. Continue DuoNeb's and home inhaler. Wheezy this am. Supportive care with antitussives.   Active Problems:  Fall with left quadrant discomfort, infrarenal AAA Nontender on exam. CT abd and plavis shows incidental finding of infrarenal AA measuring 6.8x 6.4 cm with mural thrombus. D/w Dr Imogene Burn. Recommended no acute intervention and have her follow up with Dr Darrick Penna ( who she followed previously) once acute illness resolved.    ?Progressive dementia Patient's daughter and granddaughter concerned that she gets increasingly forgetful and unable to keep track of her medications. This has been progressive or past several months. Psych consulted who recommended pt has capacity to make complex decisons. Recent B12 was normal. TSH normal  , HIV and RPR negative.  Acute on chronic diastolic CHF Noted for increased leg swellings (this is new finding as per family).echo shows EF of 60-65% with gr  1 diastolic dysfn. added daily Lasix. Check 2-D echo.    Essential hypertension, benign Stable. Continue home medications.    Iliopsoas muscle hematoma, left,  Non evident on repeat CT.  Weakness with frequent falls PT evaluation.      Code Status : DO NOT RESUSCITATE  Family Communication  : none at bedside  Disposition Plan  : Return to SNF possibly in the next 24- 48 hours if breathing better  Barriers For Discharge : Active symptoms  Consults  :   D/w Dr Imogene Burnhen on the phone  Procedures  :  CT abd and pelvis  DVT Prophylaxis  :  Lovenox -   Lab Results  Component Value Date   PLT 247 09/30/2016    Antibiotics  :   Anti-infectives    None        Objective:   Vitals:   09/30/16 2216 10/01/16 0619 10/01/16 0855 10/01/16 1433  BP: 132/70 121/64  (!) 104/48  Pulse: 91 81  92  Resp: 20 20  20   Temp: 98.2 F (36.8 C) 98.3 F (36.8 C)  98.5 F (36.9 C)  TempSrc: Oral Oral  Oral  SpO2: 93% 90% 92% (!) 87%  Weight:      Height:        Wt Readings from Last 3 Encounters:  09/29/16 66.2 kg (146 lb)  09/13/16 66.2 kg (146 lb)  09/07/16 66.2 kg (146 lb)     Intake/Output Summary (Last 24 hours) at 10/01/16 1458 Last data filed at 10/01/16 1441  Gross per 24 hour  Intake              600 ml  Output                0 ml  Net              600 ml     Physical Exam  Gen: not in distress HEENT: moist mucosa, supple neck Chest: diffuse  rhonchi over right lung  CVS: N S1&S2, no murmurs,   GI: soft, ND,rt mid quadrant tenderness Musculoskeletal: warm, trace pitting edema bilaterally     Data Review:    CBC  Recent Labs Lab 09/29/16 1419 09/30/16 0411  WBC 5.6 3.5*  HGB 9.6* 8.7*  HCT 28.7* 26.6*  PLT 259 247  MCV 102.5* 104.3*  MCH 34.3* 34.1*  MCHC 33.4 32.7  RDW 16.1* 16.0*  LYMPHSABS 0.6*  --   MONOABS 0.3  --   EOSABS 0.0  --   BASOSABS 0.0  --     Chemistries   Recent Labs Lab 09/29/16 1419 09/30/16 0411  NA 134* 132*  K 3.7 4.0  CL 102 103  CO2 23 26  GLUCOSE 97 137*  BUN 14 19  CREATININE 0.89 0.79  CALCIUM 8.2* 8.0*  AST 27  --   ALT 14  --   ALKPHOS 88  --   BILITOT 0.8  --    ------------------------------------------------------------------------------------------------------------------ No results for input(s): CHOL, HDL, LDLCALC, TRIG, CHOLHDL, LDLDIRECT in the last 72 hours.  No results found for: HGBA1C ------------------------------------------------------------------------------------------------------------------  Recent Labs  09/30/16 1002  TSH 0.939   ------------------------------------------------------------------------------------------------------------------ No results for input(s): VITAMINB12, FOLATE, FERRITIN, TIBC, IRON, RETICCTPCT in the last 72 hours.  Coagulation profile No results for input(s): INR, PROTIME in the last 168 hours.  No results for input(s): DDIMER in the last 72 hours.  Cardiac Enzymes No results for input(s): CKMB, TROPONINI, MYOGLOBIN in the last 168 hours.  Invalid input(s): CK ------------------------------------------------------------------------------------------------------------------    Component Value Date/Time   BNP 38.3 09/29/2016 1419    Inpatient Medications  Scheduled Meds: . amLODipine  10 mg Oral Daily  . aspirin EC  81 mg Oral Daily  . atorvastatin  20 mg Oral q1800  . cilostazol  100 mg Oral BID  . enoxaparin (LOVENOX) injection  40 mg Subcutaneous  Q24H  . feeding supplement (ENSURE ENLIVE)  237 mL Oral BID BM  . furosemide  40 mg Oral Daily  . mometasone-formoterol  2 puff Inhalation BID  . polyethylene glycol  17 g Oral BID  . predniSONE  40 mg Oral Q breakfast  . senna-docusate  2 tablet Oral BID   Continuous Infusions: PRN Meds:.iopamidol, ipratropium-albuterol, oxyCODONE  Micro Results Recent Results (from the past 240 hour(s))  MRSA PCR Screening     Status: None   Collection Time: 09/29/16  7:03 PM  Result Value Ref Range Status   MRSA by PCR NEGATIVE NEGATIVE Final    Comment:        The GeneXpert MRSA Assay (FDA approved for NASAL specimens only), is one component of a comprehensive MRSA colonization surveillance program. It is not intended to diagnose MRSA infection nor to guide or monitor treatment for MRSA infections.     Radiology Reports Dg Chest 2 View  Result Date: 09/29/2016 CLINICAL DATA: Chest pain EXAM: CHEST  2 VIEW COMPARISON:  September 01, 2016 FINDINGS: There is slight scarring in the right base. There is no edema or consolidation. Heart size and pulmonary vascularity are normal. No adenopathy. There is atherosclerotic calcification in the aorta. Aorta is tortuous but stable. There is arthropathy in each shoulder. IMPRESSION: No edema or consolidation. Slight scarring right base. Stable cardiac silhouette. There aorta is tortuous with aortic atherosclerosis. Electronically Signed   By: Bretta BangWilliam  Woodruff III M.D.   On: 09/29/2016 13:30   Dg Chest 2 View  Result Date: 09/01/2016 CLINICAL DATA:  Shortness of breath and cough for 1 day. EXAM: CHEST  2 VIEW COMPARISON:  08/14/2015 FINDINGS: The cardiac silhouette, mediastinal and hilar contours are within normal limits and stable. Stable tortuosity and calcification of the thoracic aorta. Chronic lung changes but no acute pulmonary findings. No pleural effusion or pneumothorax. Stable advanced degenerative changes involving both shoulders. IMPRESSION:  No acute cardiopulmonary findings. Electronically Signed   By: Rudie MeyerP.  Gallerani M.D.   On: 09/01/2016 20:21   Ct Abdomen Pelvis W Contrast  Result Date: 09/30/2016 CLINICAL DATA:  History of iliopsoas hematoma, recent falls with left-sided pain, initial encounter EXAM: CT ABDOMEN AND PELVIS WITH CONTRAST TECHNIQUE: Multidetector CT imaging of the abdomen and pelvis was performed using the standard protocol following bolus administration of intravenous contrast. CONTRAST:  75 mL Isovue-300 COMPARISON:  09/01/2016. FINDINGS: Lower chest: Bilateral lower lobe atelectasis. No sizable effusion is seen. Small hiatal hernia is noted. Hepatobiliary: No focal liver abnormality is seen. No gallstones, gallbladder wall thickening, or biliary dilatation. Pancreas: Unremarkable. No pancreatic ductal dilatation or surrounding inflammatory changes. Spleen: Multiple calcified granulomas are noted. The spleen is otherwise within normal limits. Adrenals/Urinary Tract: Renal vascular calcifications are noted. No calculi are seen. No obstructive changes are noted. The adrenal glands show mild fullness on the left likely related to hyperplasia. Stomach/Bowel: Diffuse diverticular change of the colon is noted. The appendix is within normal limits. No inflammatory changes are noted. Vascular/Lymphatic: Diffuse aortic calcifications are noted. There are changes consistent within infrarenal aortic aneurysm. It measures 6.8 x 6.4 cm in  greatest AP and transverse dimensions respectively. Significant mural thrombus is identified no extravasation is identified. The aneurysm extends to the aortic bifurcation. Diffuse calcifications are noted throughout the iliac vessels without aneurysmal dilatation. Reproductive: Uterus and bilateral adnexa are unremarkable. Other: Left inguinal hernia is noted with multiple loops of small bowel within. No obstructive changes are seen. This is stable from the prior exam. Musculoskeletal: Degenerative changes  of lumbar spine are seen. IMPRESSION: Mild bibasilar atelectatic changes without pleural effusion. Abdominal aortic aneurysm as described. Vascular surgery consultation recommended due to increased risk of rupture for AAA >5.5 cm. This recommendation follows ACR consensus guidelines: White Paper of the ACR Incidental Findings Committee II on Vascular Findings. J Am Coll Radiol 2013; 10:789-794. Diverticulosis without diverticulitis. Left inguinal hernia containing small bowel loops without incarceration. Electronically Signed   By: Alcide Clever M.D.   On: 09/30/2016 15:56    Time Spent in minutes  25   Eddie North M.D on 10/01/2016 at 2:58 PM  Between 7am to 7pm - Pager - [phone]  After 7pm go to [email or web address] - password [facility]  [facility] -  Office  [phone]

## 2016-10-02 ENCOUNTER — Inpatient Hospital Stay (HOSPITAL_COMMUNITY): Payer: Medicare Other

## 2016-10-02 MED ORDER — VITAMINS A & D EX OINT
TOPICAL_OINTMENT | CUTANEOUS | Status: AC
  Administered 2016-10-02: 03:00:00
  Filled 2016-10-02: qty 5

## 2016-10-02 MED ORDER — FUROSEMIDE 10 MG/ML IJ SOLN: 40.0000 mg | Freq: Once | INTRAMUSCULAR | Status: DC

## 2016-10-02 MED ORDER — FUROSEMIDE 10 MG/ML IJ SOLN
40.0000 mg | Freq: Once | INTRAMUSCULAR | Status: AC
  Administered 2016-10-02: 40 mg via INTRAVENOUS
  Filled 2016-10-02: qty 4

## 2016-10-02 MED ORDER — ALPRAZOLAM 0.25 MG PO TABS
0.2500 mg | ORAL_TABLET | Freq: Three times a day (TID) | ORAL | Status: DC | PRN
  Administered 2016-10-02: 0.25 mg via ORAL
  Filled 2016-10-02: qty 1

## 2016-10-02 NOTE — Progress Notes (Signed)
PROGRESS NOTE                                                                                                                                                                                                             Patient Demographics:    Carrie Pena, is a 82 y.o. female, DOB - November 02, 1927, IRW:[id number]  Admit date - 09/29/2016   Admitting Physician Narda Bonds, MD  Outpatient Primary MD for the patient is Thayer Headings, MD  LOS - 3  Outpatient Specialists:none  Chief Complaint  Patient presents with  . Shortness of Breath       Brief Narrative  82 year old female with diastolic CHF, COPD not on home O2, hypertension,  GERD, PAD, recent ileo psoas muscle hematoma after sustaining fall at home. She was monitored in the hospital and discharged to SNF. Patient reportedly had fallen at SNF few days back and landed on her left side. For past 3 days she was also having worsening productive cough with increased dyspnea requiring oxygen. Patient sent to the ED for these symptoms.  In the ED she was afebrile, requiring 4 L via nasal cannula and hypertensive. Blood work was unremarkable. Chest x-ray was negative for infiltrate. Admitted to hospitalist service for COPD exacerbation.     Subjective:   Still wheezy. Denies abdominal pain. Patient anxious and wanting to go back to SNF.   Assessment  & Plan :    Principal Problem:   COPD with acute exacerbation (HCC) Received Solu-Medrol in the ED, transitioned to oral prednisone. Continue DuoNeb's and home inhaler. Still Wheezy on right lung. Titrate down oxygen.  Active Problems:  Fall with left quadrant discomfort, infrarenal AAA Nontender on exam. CT abd and plavis shows incidental finding of infrarenal AA measuring 6.8x 6.4 cm with mural thrombus. D/w Dr Imogene Burn. Recommended no acute intervention and have her follow up with Dr Darrick Penna ( who she followed previously)  once acute illness resolved.   ?Progressive dementia Patient's daughter and granddaughter concerned that she gets increasingly forgetful and unable to keep track of her medications. This has been progressive or past several months. Psych consulted who recommended pt has capacity to make complex decisons. Recent B12 was normal. TSH normal  , HIV and RPR negative. Patient extremely anxious. Will add low dose xanax prn.  Acute on chronic diastolic CHF Noted for  increased leg swellings (this is new finding as per family).echo shows EF of 60-65% with gr 1 diastolic dysfn. added daily Lasix. Check 2-D echo.    Essential hypertension, benign Stable. Continue home medications.    Iliopsoas muscle hematoma, left,  Non evident on repeat CT.  Weakness with frequent falls PT evaluation.      Code Status : DO NOT RESUSCITATE  Family Communication  : none at bedside. Updated granddaughter on the phone.  Disposition Plan  : Return to SNF a.m. if wheezing and hypoxia improved  Barriers For Discharge : Active symptoms  Consults  :   D/w Dr Imogene Burn on the phone  Procedures  :  CT abd and pelvis  DVT Prophylaxis  :  Lovenox -   Lab Results  Component Value Date   PLT 247 09/30/2016    Antibiotics  :   Anti-infectives    None        Objective:   Vitals:   10/01/16 2033 10/01/16 2043 10/02/16 0500 10/02/16 0828  BP:  (!) 101/50 130/64   Pulse:  95 88   Resp:  20 18   Temp:  98.4 F (36.9 C) 98.4 F (36.9 C)   TempSrc:  Oral Oral   SpO2: 93% (!) 88% 93% 95%  Weight:      Height:        Wt Readings from Last 3 Encounters:  09/29/16 66.2 kg (146 lb)  09/13/16 66.2 kg (146 lb)  09/07/16 66.2 kg (146 lb)     Intake/Output Summary (Last 24 hours) at 10/02/16 1218 Last data filed at 10/02/16 0900  Gross per 24 hour  Intake              720 ml  Output                0 ml  Net              720 ml     Physical Exam  Gen: not in distress HEENT: moist mucosa, supple  neck Chest: diffuse  rhonchi over right lung  CVS: N S1&S2, no murmurs,  GI: soft, ND,rt mid quadrant tenderness Musculoskeletal: warm, trace pitting edema bilaterally CNS: alert and oriented, anxious     Data Review:    CBC  Recent Labs Lab 09/29/16 1419 09/30/16 0411  WBC 5.6 3.5*  HGB 9.6* 8.7*  HCT 28.7* 26.6*  PLT 259 247  MCV 102.5* 104.3*  MCH 34.3* 34.1*  MCHC 33.4 32.7  RDW 16.1* 16.0*  LYMPHSABS 0.6*  --   MONOABS 0.3  --   EOSABS 0.0  --   BASOSABS 0.0  --     Chemistries   Recent Labs Lab 09/29/16 1419 09/30/16 0411  NA 134* 132*  K 3.7 4.0  CL 102 103  CO2 23 26  GLUCOSE 97 137*  BUN 14 19  CREATININE 0.89 0.79  CALCIUM 8.2* 8.0*  AST 27  --   ALT 14  --   ALKPHOS 88  --   BILITOT 0.8  --    ------------------------------------------------------------------------------------------------------------------ No results for input(s): CHOL, HDL, LDLCALC, TRIG, CHOLHDL, LDLDIRECT in the last 72 hours.  No results found for: HGBA1C ------------------------------------------------------------------------------------------------------------------  Recent Labs  09/30/16 1002  TSH 0.939   ------------------------------------------------------------------------------------------------------------------ No results for input(s): VITAMINB12, FOLATE, FERRITIN, TIBC, IRON, RETICCTPCT in the last 72 hours.  Coagulation profile No results for input(s): INR, PROTIME in the last 168 hours.  No results for input(s): DDIMER in the  last 72 hours.  Cardiac Enzymes No results for input(s): CKMB, TROPONINI, MYOGLOBIN in the last 168 hours.  Invalid input(s): CK ------------------------------------------------------------------------------------------------------------------    Component Value Date/Time   BNP 38.3 09/29/2016 1419    Inpatient Medications  Scheduled Meds: . amLODipine  10 mg Oral Daily  . aspirin EC  81 mg Oral Daily  . atorvastatin   20 mg Oral q1800  . cilostazol  100 mg Oral BID  . enoxaparin (LOVENOX) injection  40 mg Subcutaneous Q24H  . feeding supplement (ENSURE ENLIVE)  237 mL Oral BID BM  . furosemide  40 mg Oral Daily  . mometasone-formoterol  2 puff Inhalation BID  . polyethylene glycol  17 g Oral BID  . predniSONE  40 mg Oral Q breakfast  . senna-docusate  2 tablet Oral BID   Continuous Infusions: PRN Meds:.iopamidol, ipratropium-albuterol, oxyCODONE  Micro Results Recent Results (from the past 240 hour(s))  MRSA PCR Screening     Status: None   Collection Time: 09/29/16  7:03 PM  Result Value Ref Range Status   MRSA by PCR NEGATIVE NEGATIVE Final    Comment:        The GeneXpert MRSA Assay (FDA approved for NASAL specimens only), is one component of a comprehensive MRSA colonization surveillance program. It is not intended to diagnose MRSA infection nor to guide or monitor treatment for MRSA infections.     Radiology Reports Dg Chest 2 View  Result Date: 09/29/2016 CLINICAL DATA: Chest pain EXAM: CHEST  2 VIEW COMPARISON:  September 01, 2016 FINDINGS: There is slight scarring in the right base. There is no edema or consolidation. Heart size and pulmonary vascularity are normal. No adenopathy. There is atherosclerotic calcification in the aorta. Aorta is tortuous but stable. There is arthropathy in each shoulder. IMPRESSION: No edema or consolidation. Slight scarring right base. Stable cardiac silhouette. There aorta is tortuous with aortic atherosclerosis. Electronically Signed   By: Bretta BangWilliam  Woodruff III M.D.   On: 09/29/2016 13:30   Ct Abdomen Pelvis W Contrast  Result Date: 09/30/2016 CLINICAL DATA:  History of iliopsoas hematoma, recent falls with left-sided pain, initial encounter EXAM: CT ABDOMEN AND PELVIS WITH CONTRAST TECHNIQUE: Multidetector CT imaging of the abdomen and pelvis was performed using the standard protocol following bolus administration of intravenous contrast. CONTRAST:   75 mL Isovue-300 COMPARISON:  09/01/2016. FINDINGS: Lower chest: Bilateral lower lobe atelectasis. No sizable effusion is seen. Small hiatal hernia is noted. Hepatobiliary: No focal liver abnormality is seen. No gallstones, gallbladder wall thickening, or biliary dilatation. Pancreas: Unremarkable. No pancreatic ductal dilatation or surrounding inflammatory changes. Spleen: Multiple calcified granulomas are noted. The spleen is otherwise within normal limits. Adrenals/Urinary Tract: Renal vascular calcifications are noted. No calculi are seen. No obstructive changes are noted. The adrenal glands show mild fullness on the left likely related to hyperplasia. Stomach/Bowel: Diffuse diverticular change of the colon is noted. The appendix is within normal limits. No inflammatory changes are noted. Vascular/Lymphatic: Diffuse aortic calcifications are noted. There are changes consistent within infrarenal aortic aneurysm. It measures 6.8 x 6.4 cm in greatest AP and transverse dimensions respectively. Significant mural thrombus is identified no extravasation is identified. The aneurysm extends to the aortic bifurcation. Diffuse calcifications are noted throughout the iliac vessels without aneurysmal dilatation. Reproductive: Uterus and bilateral adnexa are unremarkable. Other: Left inguinal hernia is noted with multiple loops of small bowel within. No obstructive changes are seen. This is stable from the prior exam. Musculoskeletal: Degenerative changes of lumbar spine  are seen. IMPRESSION: Mild bibasilar atelectatic changes without pleural effusion. Abdominal aortic aneurysm as described. Vascular surgery consultation recommended due to increased risk of rupture for AAA >5.5 cm. This recommendation follows ACR consensus guidelines: White Paper of the ACR Incidental Findings Committee II on Vascular Findings. J Am Coll Radiol 2013; 10:789-794. Diverticulosis without diverticulitis. Left inguinal hernia containing small  bowel loops without incarceration. Electronically Signed   By: Alcide CleverMark  Lukens M.D.   On: 09/30/2016 15:56    Time Spent in minutes  25   Eddie NorthHUNGEL, Dante Roudebush M.D on 10/02/2016 at 12:18 PM  Between 7am to 7pm - Pager - [phone]  After 7pm go to [email or web address] - password [email or web address]  [facility] -  Office  [phone]

## 2016-10-02 NOTE — Evaluation (Signed)
Physical Therapy Evaluation Patient Details Name: Carrie Pena MRN: [id number] DOB: [birth year] Today's Date: 10/02/2016   History of Present Illness  82 yo female admitted with COPD exacerbation. Hx of falls, L iliopsoas muscle hematoma, COPD, HTN  Clinical Impression  On eval, pt required Mod assist for mobility. Pt requested assistance to use bathroom. Performed squat pivot to bsc from recliner, then stand pivot, with RW, from bsc to recliner. Pt very dyspneic after both transfers. Once situated back in recliner, pt required increased time and breathing cues for recovery. O2 sats staying around 82% on 3L O2. Increased [place] O2 to 5L-sats only up to 84%. Called out for RN to assess pt. Remained with pt for at least another 10 minutes before RN arrived. RN assessed pt and took over care.     Follow Up Recommendations SNF    Equipment Recommendations  None recommended by PT    Recommendations for Other Services       Precautions / Restrictions Precautions Precautions: Fall Precaution Comments: monitor O2 sats Restrictions Weight Bearing Restrictions: No      Mobility  Bed Mobility               General bed mobility comments: oob in recliner  Transfers Overall transfer level: Needs assistance Equipment used: Rolling walker (2 wheeled) Transfers: Sit to/from [employer]Stand;Squat Pivot Transfers Sit to Stand: Mod assist   Squat pivot transfers: Mod assist     General transfer comment: Assist to rise, stabilize, control descent. Squat pivot x1, recliner to bsc. Stand pivot x1, bsc to recliner with RW. Pt had significant difficulty taking pivotal steps. Dyspnea 4/4 with transfers.   Ambulation/Gait             General Gait Details: NT-pt unable  Stairs            Wheelchair Mobility    Modified Rankin (Stroke Patients Only)       Balance Overall balance assessment: Needs assistance;History of Falls           Standing balance-Leahy Scale: Poor                                Pertinent Vitals/Pain      Home Living Family/patient expects to be discharged to:: Skilled nursing facility                      Prior Function Level of Independence: Needs assistance   Gait / Transfers Assistance Needed: transferring bed to chair and participating with therapies           Hand Dominance        Extremity/Trunk Assessment   Upper Extremity Assessment Upper Extremity Assessment: Generalized weakness    Lower Extremity Assessment Lower Extremity Assessment: Generalized weakness       Communication   Communication: HOH  Cognition Arousal/Alertness: Awake/alert Behavior During Therapy: Anxious (due to cardiopulmonary status) Overall Cognitive Status: Within Functional Limits for tasks assessed                      General Comments      Exercises     Assessment/Plan    PT Assessment Patient needs continued PT services  PT Problem List Decreased mobility;Decreased activity tolerance;Decreased balance;Cardiopulmonary status limiting activity;Decreased knowledge of use of DME          PT Treatment Interventions Therapeutic activities;Therapeutic exercise;Patient/family education;Functional mobility training;DME instruction;Balance training  PT Goals (Current goals can be found in the Care Plan section)  Acute Rehab PT Goals Patient Stated Goal: breathe better. Get back to rehab PT Goal Formulation: With patient Time For Goal Achievement: 10/16/16 Potential to Achieve Goals: Fair    Frequency Min 2X/week   Barriers to discharge        Co-evaluation               End of Session   Activity Tolerance: Patient limited by fatigue (Limited by dyspnea) Patient left: in chair;with call bell/phone within reach           Time: 1412-1450 PT Time Calculation (min) (ACUTE ONLY): 38 min   Charges:   PT Evaluation $PT Eval Low Complexity: 1 Procedure PT Treatments $Therapeutic  Activity: 23-37 mins   PT G Codes:        Rebeca AlertJannie Athleen Feltner, MPT Pager: [phone]

## 2016-10-02 NOTE — Progress Notes (Addendum)
Pt sats 81% on 4L o2 after getting up with PT to bedside commode.  Patient given time to rest in recliner and respiratory therapy called for prn treatment.   After breathing treatment administered, wheezing decreased,  however sats only up to 85-86% on 4L O2.  MD paged and awaiting response.   MD responded and orders placed for chest xray and lasix. Current O2 sat saturation up to 88% on 4L at rest.

## 2016-10-02 NOTE — Progress Notes (Signed)
Pt continues to maintain sats at 88% on 4L of O2 at rest.  Md at bedside to re-eval pt, no changes to be made to treatment at this time.  Pt alert and in no visible distress.  Will continue to monitor.

## 2016-10-03 ENCOUNTER — Encounter (HOSPITAL_COMMUNITY): Payer: Self-pay

## 2016-10-03 ENCOUNTER — Inpatient Hospital Stay (HOSPITAL_COMMUNITY)
Admission: EM | Admit: 2016-10-03 | Discharge: 2016-10-05 | DRG: 175 | Disposition: A | Discharge status: Skilled Nursing Facility | Payer: Medicare Other | Attending: Internal Medicine | Admitting: Internal Medicine

## 2016-10-03 ENCOUNTER — Emergency Department (HOSPITAL_COMMUNITY): Payer: Medicare Other

## 2016-10-03 DIAGNOSIS — Z809 Family history of malignant neoplasm, unspecified: Secondary | ICD-10-CM

## 2016-10-03 DIAGNOSIS — R739 Hyperglycemia, unspecified: Secondary | ICD-10-CM | POA: Diagnosis present

## 2016-10-03 DIAGNOSIS — J9601 Acute respiratory failure with hypoxia: Secondary | ICD-10-CM

## 2016-10-03 DIAGNOSIS — R911 Solitary pulmonary nodule: Secondary | ICD-10-CM | POA: Diagnosis not present

## 2016-10-03 DIAGNOSIS — I714 Abdominal aortic aneurysm, without rupture, unspecified: Secondary | ICD-10-CM | POA: Diagnosis present

## 2016-10-03 DIAGNOSIS — K219 Gastro-esophageal reflux disease without esophagitis: Secondary | ICD-10-CM | POA: Diagnosis present

## 2016-10-03 DIAGNOSIS — E785 Hyperlipidemia, unspecified: Secondary | ICD-10-CM | POA: Diagnosis present

## 2016-10-03 DIAGNOSIS — Z86711 Personal history of pulmonary embolism: Secondary | ICD-10-CM | POA: Diagnosis present

## 2016-10-03 DIAGNOSIS — J441 Chronic obstructive pulmonary disease with (acute) exacerbation: Secondary | ICD-10-CM | POA: Diagnosis not present

## 2016-10-03 DIAGNOSIS — I110 Hypertensive heart disease with heart failure: Secondary | ICD-10-CM | POA: Diagnosis not present

## 2016-10-03 DIAGNOSIS — N179 Acute kidney failure, unspecified: Secondary | ICD-10-CM | POA: Diagnosis not present

## 2016-10-03 DIAGNOSIS — Z9981 Dependence on supplemental oxygen: Secondary | ICD-10-CM

## 2016-10-03 DIAGNOSIS — I2699 Other pulmonary embolism without acute cor pulmonale: Secondary | ICD-10-CM

## 2016-10-03 DIAGNOSIS — R0902 Hypoxemia: Secondary | ICD-10-CM | POA: Diagnosis not present

## 2016-10-03 DIAGNOSIS — N289 Disorder of kidney and ureter, unspecified: Secondary | ICD-10-CM

## 2016-10-03 DIAGNOSIS — D649 Anemia, unspecified: Secondary | ICD-10-CM | POA: Diagnosis not present

## 2016-10-03 DIAGNOSIS — I739 Peripheral vascular disease, unspecified: Secondary | ICD-10-CM | POA: Diagnosis present

## 2016-10-03 DIAGNOSIS — D62 Acute posthemorrhagic anemia: Secondary | ICD-10-CM | POA: Insufficient documentation

## 2016-10-03 DIAGNOSIS — F411 Generalized anxiety disorder: Secondary | ICD-10-CM

## 2016-10-03 DIAGNOSIS — Z7982 Long term (current) use of aspirin: Secondary | ICD-10-CM

## 2016-10-03 DIAGNOSIS — Z8249 Family history of ischemic heart disease and other diseases of the circulatory system: Secondary | ICD-10-CM

## 2016-10-03 DIAGNOSIS — J449 Chronic obstructive pulmonary disease, unspecified: Secondary | ICD-10-CM

## 2016-10-03 DIAGNOSIS — I5032 Chronic diastolic (congestive) heart failure: Secondary | ICD-10-CM | POA: Diagnosis not present

## 2016-10-03 DIAGNOSIS — E860 Dehydration: Secondary | ICD-10-CM | POA: Diagnosis present

## 2016-10-03 DIAGNOSIS — Z87891 Personal history of nicotine dependence: Secondary | ICD-10-CM

## 2016-10-03 DIAGNOSIS — J9621 Acute and chronic respiratory failure with hypoxia: Secondary | ICD-10-CM | POA: Diagnosis present

## 2016-10-03 DIAGNOSIS — Z79899 Other long term (current) drug therapy: Secondary | ICD-10-CM

## 2016-10-03 DIAGNOSIS — D539 Nutritional anemia, unspecified: Secondary | ICD-10-CM | POA: Diagnosis present

## 2016-10-03 DIAGNOSIS — Z66 Do not resuscitate: Secondary | ICD-10-CM | POA: Diagnosis present

## 2016-10-03 LAB — BASIC METABOLIC PANEL
Anion gap: 8 (ref 5–15)
BUN: 34 mg/dL — ABNORMAL HIGH (ref 6–20)
CALCIUM: 8.2 mg/dL — AB (ref 8.9–10.3)
CO2: 28 mmol/L (ref 22–32)
CREATININE: 1.23 mg/dL — AB (ref 0.44–1.00)
Chloride: 101 mmol/L (ref 101–111)
GFR calc non Af Amer: 39 mL/min — ABNORMAL LOW (ref >60)
GFR, EST AFRICAN AMERICAN: 45 mL/min — AB (ref >60)
Glucose, Bld: 183 mg/dL — ABNORMAL HIGH (ref 65–99)
Potassium: 3.9 mmol/L (ref 3.5–5.1)
SODIUM: 137 mmol/L (ref 135–145)

## 2016-10-03 LAB — CBC
HEMATOCRIT: 26.6 % — AB (ref 36.0–46.0)
Hemoglobin: 8.6 g/dL — ABNORMAL LOW (ref 12.0–15.0)
MCH: 33 pg (ref 26.0–34.0)
MCHC: 32.3 g/dL (ref 30.0–36.0)
MCV: 101.9 fL — ABNORMAL HIGH (ref 78.0–100.0)
PLATELETS: 368 10*3/uL (ref 150–400)
RBC: 2.61 MIL/uL — ABNORMAL LOW (ref 3.87–5.11)
RDW: 15.7 % — ABNORMAL HIGH (ref 11.5–15.5)
WBC: 8.9 10*3/uL (ref 4.0–10.5)

## 2016-10-03 LAB — IRON AND TIBC
Iron: 33 ug/dL (ref 28–170)
Saturation Ratios: 13 % (ref 10.4–31.8)
TIBC: 255 ug/dL (ref 250–450)
UIBC: 222 ug/dL

## 2016-10-03 LAB — I-STAT TROPONIN, ED: Troponin i, poc: 0 ng/mL (ref 0.00–0.08)

## 2016-10-03 LAB — VITAMIN B12: VITAMIN B 12: 679 pg/mL (ref 180–914)

## 2016-10-03 LAB — RETICULOCYTES
RBC.: 2.49 MIL/uL — AB (ref 3.87–5.11)
RETIC COUNT ABSOLUTE: 89.6 10*3/uL (ref 19.0–186.0)
RETIC CT PCT: 3.6 % — AB (ref 0.4–3.1)

## 2016-10-03 LAB — BRAIN NATRIURETIC PEPTIDE: B Natriuretic Peptide: 48.8 pg/mL (ref 0.0–100.0)

## 2016-10-03 LAB — MRSA PCR SCREENING: MRSA BY PCR: NEGATIVE

## 2016-10-03 LAB — FERRITIN: Ferritin: 139 ng/mL (ref 11–307)

## 2016-10-03 LAB — FOLATE: Folate: 12.1 ng/mL (ref >5.9)

## 2016-10-03 MED ORDER — HEPARIN (PORCINE) IN NACL 100-0.45 UNIT/ML-% IJ SOLN
1100.0000 [IU]/h | INTRAMUSCULAR | Status: DC
  Administered 2016-10-03: 1100 [IU]/h via INTRAVENOUS
  Filled 2016-10-03 (×2): qty 250

## 2016-10-03 MED ORDER — ONDANSETRON HCL 4 MG PO TABS: 4.0000 mg | ORAL_TABLET | Freq: Four times a day (QID) | ORAL | Status: DC | PRN

## 2016-10-03 MED ORDER — ALPRAZOLAM 0.25 MG PO TABS
0.2500 mg | ORAL_TABLET | Freq: Three times a day (TID) | ORAL | Status: DC | PRN
  Administered 2016-10-04: 0.25 mg via ORAL
  Filled 2016-10-03: qty 1

## 2016-10-03 MED ORDER — OXYCODONE HCL 5 MG PO TABS: 5.0000 mg | ORAL_TABLET | Freq: Four times a day (QID) | ORAL | 0 refills | Status: DC | PRN

## 2016-10-03 MED ORDER — SODIUM CHLORIDE 0.9 % IJ SOLN
INTRAMUSCULAR | Status: AC
  Filled 2016-10-03: qty 50

## 2016-10-03 MED ORDER — PREDNISONE 20 MG PO TABS: 40.0000 mg | ORAL_TABLET | Freq: Every day | ORAL | 0 refills | Status: AC

## 2016-10-03 MED ORDER — MENTHOL (TOPICAL ANALGESIC) 5 % EX PTCH: 1.0000 "application " | MEDICATED_PATCH | Freq: Every day | CUTANEOUS | Status: DC

## 2016-10-03 MED ORDER — ONDANSETRON HCL 4 MG/2ML IJ SOLN: 4.0000 mg | Freq: Four times a day (QID) | INTRAMUSCULAR | Status: DC | PRN

## 2016-10-03 MED ORDER — IPRATROPIUM-ALBUTEROL 0.5-2.5 (3) MG/3ML IN SOLN: 3.0000 mL | Freq: Once | RESPIRATORY_TRACT | Status: DC

## 2016-10-03 MED ORDER — CILOSTAZOL 100 MG PO TABS
100.0000 mg | ORAL_TABLET | Freq: Two times a day (BID) | ORAL | Status: DC
  Administered 2016-10-03 – 2016-10-05 (×4): 100 mg via ORAL
  Filled 2016-10-03 (×4): qty 1

## 2016-10-03 MED ORDER — SODIUM CHLORIDE 0.9% FLUSH
3.0000 mL | Freq: Two times a day (BID) | INTRAVENOUS | Status: DC
  Administered 2016-10-05: 3 mL via INTRAVENOUS

## 2016-10-03 MED ORDER — HEPARIN BOLUS VIA INFUSION
2000.0000 [IU] | Freq: Once | INTRAVENOUS | Status: AC
  Administered 2016-10-03: 2000 [IU] via INTRAVENOUS
  Filled 2016-10-03: qty 2000

## 2016-10-03 MED ORDER — SIMVASTATIN 40 MG PO TABS: 40.0000 mg | ORAL_TABLET | Freq: Every day | ORAL | Status: DC

## 2016-10-03 MED ORDER — PREDNISONE 20 MG PO TABS
40.0000 mg | ORAL_TABLET | Freq: Every day | ORAL | Status: DC
  Administered 2016-10-04 – 2016-10-05 (×2): 40 mg via ORAL
  Filled 2016-10-03 (×2): qty 2

## 2016-10-03 MED ORDER — ALPRAZOLAM 0.25 MG PO TABS: 0.2500 mg | ORAL_TABLET | Freq: Three times a day (TID) | ORAL | 0 refills | Status: DC | PRN

## 2016-10-03 MED ORDER — SODIUM CHLORIDE 0.9 % IV SOLN: 250.0000 mL | INTRAVENOUS | Status: DC | PRN

## 2016-10-03 MED ORDER — ASPIRIN EC 81 MG PO TBEC
81.0000 mg | DELAYED_RELEASE_TABLET | Freq: Every day | ORAL | Status: DC
  Administered 2016-10-04 – 2016-10-05 (×2): 81 mg via ORAL
  Filled 2016-10-03 (×2): qty 1

## 2016-10-03 MED ORDER — ENSURE ENLIVE PO LIQD
237.0000 mL | Freq: Two times a day (BID) | ORAL | Status: DC
  Administered 2016-10-04: 237 mL via ORAL
  Filled 2016-10-03 (×2): qty 237

## 2016-10-03 MED ORDER — IPRATROPIUM-ALBUTEROL 0.5-2.5 (3) MG/3ML IN SOLN
3.0000 mL | Freq: Once | RESPIRATORY_TRACT | Status: AC
  Administered 2016-10-03: 3 mL via RESPIRATORY_TRACT
  Filled 2016-10-03: qty 3

## 2016-10-03 MED ORDER — SODIUM CHLORIDE 0.9% FLUSH: 3.0000 mL | INTRAVENOUS | Status: DC | PRN

## 2016-10-03 MED ORDER — POLYETHYLENE GLYCOL 3350 17 G PO PACK
17.0000 g | PACK | Freq: Two times a day (BID) | ORAL | Status: DC
  Administered 2016-10-04 – 2016-10-05 (×3): 17 g via ORAL
  Filled 2016-10-03 (×3): qty 1

## 2016-10-03 MED ORDER — PROCEL PO POWD: 2.0000 | Freq: Two times a day (BID) | ORAL | Status: DC

## 2016-10-03 MED ORDER — MOMETASONE FURO-FORMOTEROL FUM 200-5 MCG/ACT IN AERO
2.0000 | INHALATION_SPRAY | Freq: Two times a day (BID) | RESPIRATORY_TRACT | Status: DC
  Administered 2016-10-03 – 2016-10-04 (×2): 2 via RESPIRATORY_TRACT
  Filled 2016-10-03: qty 8.8

## 2016-10-03 MED ORDER — SENNOSIDES-DOCUSATE SODIUM 8.6-50 MG PO TABS
2.0000 | ORAL_TABLET | Freq: Two times a day (BID) | ORAL | Status: DC
  Administered 2016-10-04 – 2016-10-05 (×3): 2 via ORAL
  Filled 2016-10-03 (×3): qty 2

## 2016-10-03 MED ORDER — IPRATROPIUM-ALBUTEROL 0.5-2.5 (3) MG/3ML IN SOLN: 3.0000 mL | RESPIRATORY_TRACT | Status: DC | PRN

## 2016-10-03 MED ORDER — OXYCODONE HCL 5 MG PO TABS: 5.0000 mg | ORAL_TABLET | ORAL | Status: DC | PRN

## 2016-10-03 MED ORDER — FUROSEMIDE 40 MG PO TABS: 40.0000 mg | ORAL_TABLET | Freq: Every day | ORAL | 0 refills | Status: DC

## 2016-10-03 MED ORDER — AMLODIPINE BESYLATE 5 MG PO TABS
10.0000 mg | ORAL_TABLET | Freq: Every day | ORAL | Status: DC
  Administered 2016-10-04 – 2016-10-05 (×2): 10 mg via ORAL
  Filled 2016-10-03 (×2): qty 2

## 2016-10-03 MED ORDER — IOPAMIDOL (ISOVUE-370) INJECTION 76%
INTRAVENOUS | Status: AC
  Filled 2016-10-03: qty 100

## 2016-10-03 MED ORDER — IOPAMIDOL (ISOVUE-370) INJECTION 76%
100.0000 mL | Freq: Once | INTRAVENOUS | Status: AC | PRN
  Administered 2016-10-03: 75 mL via INTRAVENOUS

## 2016-10-03 MED ORDER — SODIUM CHLORIDE 0.9% FLUSH: 3.0000 mL | Freq: Two times a day (BID) | INTRAVENOUS | Status: DC

## 2016-10-03 NOTE — ED Notes (Signed)
MD at bedside. 

## 2016-10-03 NOTE — ED Provider Notes (Signed)
[facility]-EMERGENCY DEPT Provider Note   CSN: [id number] Arrival date & time: 10/03/16  1515     History   Chief Complaint Chief Complaint  Patient presents with  . Respiratory Distress    HPI Carrie Pena is a 82 y.o. female.  HPI Patient was admitted to the hospital on December 14 for a COPD exacerbation. Patient was given albuterol, Atrovent treatments and Solu-Medrol.    Patient was discharged from the hospital today.  The patient was being picked up by EMS personnel. They were concerned about the patient's hypoxia and oxygen requirement on the hospital ward. The patient was brought down to the ambulance. However, before leaving the hospital premises they decided to bring her back into the emergency room because they did not feel that she was stable for discharge.  They did not communicate with the discharging medical provider before she was released from the hospital bed and brought back into the emergency room. Patient states she is not having any acute changes from today and yesterday. She does not feel much better than when she was recently in the hospital.  Patient was supposed to be discharged back to her nursing facility on home oxygen which is a new event for her.  Past Medical History:  Diagnosis Date  . Carpal tunnel syndrome   . COPD (chronic obstructive pulmonary disease) (HCC)   . GERD (gastroesophageal reflux disease)   . Hyperlipidemia   . Hypertension   . Osteoarthritis     Patient Active Problem List   Diagnosis Date Noted  . AAA (abdominal aortic aneurysm) without rupture (HCC) 10/03/2016  . Acute respiratory failure with hypoxia (HCC) 10/03/2016  . Anxiety state 10/03/2016  . COPD (chronic obstructive pulmonary disease) (HCC) 09/29/2016  . Chronic diastolic heart failure (HCC) 09/29/2016  . Acute diastolic heart failure (HCC) 09/06/2016  . AKI (acute kidney injury) (HCC) 09/06/2016  . Iliopsoas muscle hematoma, left, initial encounter 09/01/2016  .  Intractable pain 09/01/2016  . Macrocytic anemia 09/01/2016  . Essential hypertension, benign 08/22/2015  . COPD with acute exacerbation (HCC) 08/12/2015  . Respiratory failure (HCC) 08/12/2015  . CAP (community acquired pneumonia) 08/12/2015  . Pain of right lower extremity 05/06/2014  . Swelling of limb 05/06/2014  . Atherosclerosis of native arteries of the extremities with intermittent claudication 07/18/2011    Past Surgical History:  Procedure Laterality Date  . KNEE ARTHROSCOPY    . left carpal tunnel    . TONSILLECTOMY      OB History    No data available       Home Medications    Prior to Admission medications   Medication Sig Start Date End Date Taking? Authorizing Provider  alendronate (FOSAMAX) 70 MG tablet Take 70 mg by mouth every Thursday. Take with a full glass of water on an empty stomach.    Yes Historical Provider, MD  ALPRAZolam (XANAX) 0.25 MG tablet Take 1 tablet (0.25 mg total) by mouth 3 (three) times daily as needed for anxiety. 10/03/16  Yes Nishant Dhungel, MD  amLODipine (NORVASC) 10 MG tablet Take 1 tablet (10 mg total) by mouth daily. 08/15/15  Yes Jeralyn Bennett, MD  aspirin EC 81 MG tablet Take 81 mg by mouth daily.   Yes Historical Provider, MD  atorvastatin (LIPITOR) 20 MG tablet Take 20 mg by mouth daily at 6 PM.   Yes Historical Provider, MD  budesonide-formoterol (SYMBICORT) 160-4.5 MCG/ACT inhaler Inhale 2 puffs into the lungs 2 (two) times daily.   Yes Historical  Provider, MD  cilostazol (PLETAL) 100 MG tablet Take 100 mg by mouth 2 (two) times daily.   Yes Historical Provider, MD  furosemide (LASIX) 40 MG tablet Take 1 tablet (40 mg total) by mouth daily. 10/04/16  Yes Nishant Dhungel, MD  ipratropium-albuterol (DUONEB) 0.5-2.5 (3) MG/3ML SOLN Take 3 mLs by nebulization every 4 (four) hours as needed (for wheezing/shortness of breath).   Yes Historical Provider, MD  Menthol (ICY HOT) 5 % PTCH Apply 1 application topically daily. Pt applies  to left upper arm every morning and removes at bedtime.   Yes Historical Provider, MD  mometasone-formoterol (DULERA) 200-5 MCG/ACT AERO Inhale 2 puffs into the lungs 2 (two) times daily.   Yes Historical Provider, MD  polyethylene glycol (MIRALAX / GLYCOLAX) packet Take 17 g by mouth 2 (two) times daily.   Yes Historical Provider, MD  senna-docusate (SENOKOT-S) 8.6-50 MG tablet Take 2 tablets by mouth 2 (two) times daily.   Yes Historical Provider, MD  simvastatin (ZOCOR) 40 MG tablet Take 40 mg by mouth daily at 6 PM.   Yes Historical Provider, MD  Nutritional Supplements (NUTRITIONAL SUPPLEMENT PLUS) LIQD Take 120 mLs by mouth 2 (two) times daily between meals. Med [employer]    Historical Provider, MD  oxyCODONE (OXY IR/ROXICODONE) 5 MG immediate release tablet Take 1 tablet (5 mg total) by mouth every 6 (six) hours as needed for moderate pain or severe pain. 10/03/16   Nishant Dhungel, MD  predniSONE (DELTASONE) 20 MG tablet Take 2 tablets (40 mg total) by mouth daily with breakfast. 10/04/16 10/09/16  Nishant Dhungel, MD  Protein (PROCEL) POWD Take 2 scoop by mouth 2 (two) times daily.    Historical Provider, MD    Family History Family History  Problem Relation Age of Onset  . Cancer Mother   . Cancer Father   . Hypertension Daughter   . Heart disease Daughter     before age 57    Social History Social History  Substance Use Topics  . Smoking status: Former Smoker    Packs/day: 0.50    Types: Cigarettes    Quit date: 03/12/2015  . Smokeless tobacco: Never Used  . Alcohol use No     Allergies   Tylenol [acetaminophen]   Review of Systems Review of Systems  All other systems reviewed and are negative.    Physical Exam Updated Vital Signs BP 112/61 (BP Location: Right Arm)   Pulse 102   Temp 97.6 F (36.4 C) (Oral)   Resp 23   SpO2 95%   Physical Exam  Constitutional: No distress.  Elderly, frail  HENT:  Head: Normocephalic and atraumatic.  Right Ear: External  ear normal.  Left Ear: External ear normal.  Eyes: Conjunctivae are normal. Right eye exhibits no discharge. Left eye exhibits no discharge. No scleral icterus.  Neck: Neck supple. No tracheal deviation present.  Cardiovascular: Normal rate, regular rhythm and intact distal pulses.   Pulmonary/Chest: Effort normal. No stridor. No respiratory distress. She has wheezes. She has no rales.  Abdominal: Soft. Bowel sounds are normal. She exhibits no distension. There is no tenderness. There is no rebound and no guarding.  Musculoskeletal: She exhibits no edema or tenderness.  Neurological: She is alert. She has normal strength. No cranial nerve deficit (no facial droop, extraocular movements intact, no slurred speech) or sensory deficit. She exhibits normal muscle tone. She displays no seizure activity. Coordination normal.  Skin: Skin is warm and dry. No rash noted.  Psychiatric: She has  a normal mood and affect.  Nursing note and vitals reviewed.    ED Treatments / Results  Labs (all labs ordered are listed, but only abnormal results are displayed) Labs Reviewed  CBC - Abnormal; Notable for the following:       Result Value   RBC 2.61 (*)    Hemoglobin 8.6 (*)    HCT 26.6 (*)    MCV 101.9 (*)    RDW 15.7 (*)    All other components within normal limits  BASIC METABOLIC PANEL - Abnormal; Notable for the following:    Glucose, Bld 183 (*)    BUN 34 (*)    Creatinine, Ser 1.23 (*)    Calcium 8.2 (*)    GFR calc non Af Amer 39 (*)    GFR calc Af Amer 45 (*)    All other components within normal limits  BRAIN NATRIURETIC PEPTIDE  HEPARIN LEVEL (UNFRACTIONATED)  I-STAT TROPOININ, ED   Radiology Ct Angio Chest Pe W And/or Wo Contrast  Result Date: 10/03/2016 CLINICAL DATA:  Increased difficulty breathing.  Short of breath. EXAM: CT ANGIOGRAPHY CHEST WITH CONTRAST TECHNIQUE: Multidetector CT imaging of the chest was performed using the standard protocol during bolus administration of  intravenous contrast. Multiplanar CT image reconstructions and MIPs were obtained to evaluate the vascular anatomy. CONTRAST:  75 mL Isovue COMPARISON:  Radiograph 10/02/2016 FINDINGS: Cardiovascular: A thin tubular filling defect within the LEFT upper lobe pulmonary artery (image 93, series 7). No additional pulmonary emboli. Overall clot burden is minimal. No RIGHT ventricular strain present. No pericardial fluid. No acute findings aorta great vessels. Mediastinum/Nodes: No axillary or supraclavicular lymphadenopathy. No mediastinal hilar adenopathy. No pericardial fluid. Lungs/Pleura: Centrilobular emphysema in the upper lobes. No pulmonary infarction. No airspace disease. No pleural fluid or pneumothorax Within the LEFT lower lobe 5 mm pulmonary nodule (image 32, series 8). Upper Abdomen: Limited view of the liver, kidneys, pancreas are unremarkable. Normal adrenal glands. Musculoskeletal: No aggressive osseous lesion. Review of the MIP images confirms the above findings. IMPRESSION: 1. Small acute pulmonary embolism within the LEFT upper lobe pulmonary artery. Overall clot burden is minimal. No RIGHT ventricular strain evident. 2. Coronary artery calcification and aortic atherosclerotic calcification. 3. **An incidental finding of potential clinical significance has been found. ** Small LEFT lower lobe pulmonary nodule. No follow-up needed if patient is low-risk. Non-contrast chest CT can be considered in 12 months if patient is high-risk. This recommendation follows the consensus statement: Guidelines for Management of Incidental Pulmonary Nodules Detected on CT Images: From the Fleischner Society 2017; Radiology 2017; 284:228-243. Critical Value/emergent results were called by telephone at the time of interpretation on 10/03/2016 at 5:47 pm to Dr. Linwood Dibbles , who verbally acknowledged these results. Electronically Signed   By: Genevive Bi M.D.   On: 10/03/2016 17:49   Dg Chest Port 1 View  Result  Date: 10/02/2016 CLINICAL DATA:  Shortness of breath. EXAM: PORTABLE CHEST 1 VIEW COMPARISON:  09/29/2016 FINDINGS: Lungs are hyperinflated. There is mild perihilar peribronchial thickening. No focal consolidations or pleural effusions are identified. Chronic changes are identified in the left shoulder. IMPRESSION: Hyperinflation and bronchitic changes. No focal acute pulmonary abnormality. Electronically Signed   By: Norva Pavlov M.D.   On: 10/02/2016 16:23    Procedures Procedures (including critical care time)  Medications Ordered in ED Medications  ipratropium-albuterol (DUONEB) 0.5-2.5 (3) MG/3ML nebulizer solution 3 mL (3 mLs Nebulization Not Given 10/03/16 1549)  sodium chloride 0.9 % injection (not administered)  iopamidol (ISOVUE-370) 76 % injection (not administered)  heparin ADULT infusion 100 units/mL (25000 units/25750mL sodium chloride 0.45%) (not administered)    And  heparin bolus via infusion 2,000 Units (not administered)  ipratropium-albuterol (DUONEB) 0.5-2.5 (3) MG/3ML nebulizer solution 3 mL (3 mLs Nebulization Given 10/03/16 1550)  iopamidol (ISOVUE-370) 76 % injection 100 mL (75 mLs Intravenous Contrast Given 10/03/16 1709)     Initial Impression / Assessment and Plan / ED Course  I have reviewed the triage vital signs and the nursing notes.  Pertinent labs & imaging results that were available during my care of the patient were reviewed by me and considered in my medical decision making (see chart for details).  Clinical Course as of Oct 03 1824  Mon Oct 03, 2016  1555 I spoke with Dr. Gonzella Lexhungel who discharged the patient today.  He was aware of her oxygen requirement. She was requiring up to 3-4 L of oxygen while on the ward but was down to 2 L this morning. He suggested getting a CT angiogram of the chest to exclude the possibility of PE, although unlikely,  considering her return to the emergency room.  [JK]  1735 Anemia is stable compared to yesterday.   Creatinine is increased from yesterday.  [JK]  1747 D/w Dr Amil AmenEdmunds.  Pt has a small PE and emphysema.    Will consult with the medical service considering this new diagnosis.  [JK]  1749 Will order heparin but looking at her Hgb she has been gradually declining.  Will ask to guaic her stool  [JK]    Clinical Course User Index [JK] Linwood DibblesJon Jeven Topper, MD    The patient's CT scan demonstrates a newly diagnosed pulmonary embolism.   This may be contributing to her shortness of breath on top of her known COPD. I recommend guaiac her stools considering her declining hemoglobin. I will consult the medical service for admission.  Final Clinical Impressions(s) / ED Diagnoses   Final diagnoses:  Other acute pulmonary embolism without acute cor pulmonale (HCC)  COPD exacerbation (HCC)  Pulmonary nodule  Anemia, unspecified type  Renal insufficiency    New Prescriptions New Prescriptions   No medications on file     Linwood DibblesJon Romaldo Saville, MD 10/03/16 1826

## 2016-10-03 NOTE — Progress Notes (Signed)
ED CM spoke with ED SW about pt  Reviewed concerns with oxygen needs at facility

## 2016-10-03 NOTE — Progress Notes (Signed)
Patient discharged to SNF, copies of all discharge medications and instructions sent to facility. Report called to receiving RN.  Patient to be transported via [facility].

## 2016-10-03 NOTE — ED Triage Notes (Signed)
Per [facility], [facility] was discharged to go home.  EMT states that pt's asked nurse on 3[facility] re pt's status.  She told him that pt is always like this and is discharge.  Pt has hx of COPD, but is not on home O2.  Pt is A&O x 4.  She reports she does not feel better, and her breathing is much worse.  Labored breathing noted and sats on 2L [place] is 88%

## 2016-10-03 NOTE — Progress Notes (Signed)
ED CM left message x 2 for ED SW to [facility]14 ED CM spoke with ED charge RN who confirms pt had oxygen on as EMS transported pt to EMS vehicle then noted pt with increased respiratory s/s and returned her to ED for further evaluation of increased respiratory s/s while in EMS vehicle

## 2016-10-03 NOTE — Progress Notes (Signed)
CSW spoke with ED CM regarding patient returning to facility with oxygen. ED RN is to fill out transportation form that includes patients oxygen. Patients oxygen would need to be included on DC summary for facility to order.   Stacy GardnerErin Chaz Mcglasson, LCSWA Clinical Social Worker [phone]

## 2016-10-03 NOTE — ED Notes (Signed)
Patient transported to CT 

## 2016-10-03 NOTE — ED Notes (Signed)
She is in no distress and remains comfortable and mildly short of breath.

## 2016-10-03 NOTE — H&P (Addendum)
History and Physical    Carrie Pena:[id number] DOB: 20-May-1928 DOA: 10/03/2016  PCP: Thayer Headings, MD   Patient coming from: Nursing home  Chief Complaint: Hypoxia   HPI: Carrie Pena is a 82 y.o. female with medical history significant for COPD with chronic hypoxic respiratory failure, chronic diastolic CHF, peripheral arterial disease, and hypertension who presents for evaluation of hypoxia. Patient had just been admitted 5 days prior for acute hypoxic respiratory failure and was treated for a COPD exacerbation and acute on chronic CHF. She made good improvement during hospitalization and was discharged earlier today with new home O2 and a short course of prednisone. Shortly after discharge, patient was noted to be increasingly hypoxic and she was returned to the ED for evaluation of this. Patient denied any chest pain or palpitations and did not feel that her dyspnea had particularly worsened. She denied change in vision or hearing, headache, or focal numbness or weakness.  ED Course: Upon arrival to the ED, patient is found to be afebrile, saturating 90% on 3 L/m supplemental oxygen, tachycardic in the low 100s, and with vitals otherwise stable. Troponin is undetectable and BNP is within the normal limits. Chemistry panels notable for a serum creatinine of 1.23, up from 0.79 several days earlier. CBC features a macrocytic anemia with hemoglobin of 8.6, down from 9.8 last month. CTA PE study was obtained in the emergency department and reveals a small acute pulmonary embolism in the left upper lobe pulmonary artery without evidence for right heart strain. Patient was started on heparin infusion in the emergency department. She has remained hemodynamically stable and in no acute respiratory distress. She'll be observed on the telemetry unit for ongoing evaluation and management of acute on chronic hypoxic respiratory failure, likely secondary to acute PE superimposed on her chronic  underlying cardiopulmonary disease.  Review of Systems:  All other systems reviewed and apart from HPI, are negative.  Past Medical History:  Diagnosis Date  . Carpal tunnel syndrome   . COPD (chronic obstructive pulmonary disease) (HCC)   . GERD (gastroesophageal reflux disease)   . Hyperlipidemia   . Hypertension   . Osteoarthritis     Past Surgical History:  Procedure Laterality Date  . KNEE ARTHROSCOPY    . left carpal tunnel    . TONSILLECTOMY       reports that she quit smoking about 18 months ago. Her smoking use included Cigarettes. She smoked 0.50 packs per day. She has never used smokeless tobacco. She reports that she does not drink alcohol or use drugs.  Allergies  Allergen Reactions  . Tylenol [Acetaminophen] Other (See Comments)    Reaction:  Shaking     Family History  Problem Relation Age of Onset  . Cancer Mother   . Cancer Father   . Hypertension Daughter   . Heart disease Daughter     before age 52     Prior to Admission medications   Medication Sig Start Date End Date Taking? Authorizing Provider  alendronate (FOSAMAX) 70 MG tablet Take 70 mg by mouth every Thursday. Take with a full glass of water on an empty stomach.    Yes Historical Provider, MD  ALPRAZolam (XANAX) 0.25 MG tablet Take 1 tablet (0.25 mg total) by mouth 3 (three) times daily as needed for anxiety. 10/03/16  Yes Nishant Dhungel, MD  amLODipine (NORVASC) 10 MG tablet Take 1 tablet (10 mg total) by mouth daily. 08/15/15  Yes Jeralyn Bennett, MD  aspirin EC 81 MG  tablet Take 81 mg by mouth daily.   Yes Historical Provider, MD  atorvastatin (LIPITOR) 20 MG tablet Take 20 mg by mouth daily at 6 PM.   Yes Historical Provider, MD  budesonide-formoterol (SYMBICORT) 160-4.5 MCG/ACT inhaler Inhale 2 puffs into the lungs 2 (two) times daily.   Yes Historical Provider, MD  cilostazol (PLETAL) 100 MG tablet Take 100 mg by mouth 2 (two) times daily.   Yes Historical Provider, MD  furosemide  (LASIX) 40 MG tablet Take 1 tablet (40 mg total) by mouth daily. 10/04/16  Yes Nishant Dhungel, MD  ipratropium-albuterol (DUONEB) 0.5-2.5 (3) MG/3ML SOLN Take 3 mLs by nebulization every 4 (four) hours as needed (for wheezing/shortness of breath).   Yes Historical Provider, MD  Menthol (ICY HOT) 5 % PTCH Apply 1 application topically daily. Pt applies to left upper arm every morning and removes at bedtime.   Yes Historical Provider, MD  mometasone-formoterol (DULERA) 200-5 MCG/ACT AERO Inhale 2 puffs into the lungs 2 (two) times daily.   Yes Historical Provider, MD  polyethylene glycol (MIRALAX / GLYCOLAX) packet Take 17 g by mouth 2 (two) times daily.   Yes Historical Provider, MD  senna-docusate (SENOKOT-S) 8.6-50 MG tablet Take 2 tablets by mouth 2 (two) times daily.   Yes Historical Provider, MD  simvastatin (ZOCOR) 40 MG tablet Take 40 mg by mouth daily at 6 PM.   Yes Historical Provider, MD  Nutritional Supplements (NUTRITIONAL SUPPLEMENT PLUS) LIQD Take 120 mLs by mouth 2 (two) times daily between meals. [facility]    Historical Provider, MD  oxyCODONE (OXY IR/ROXICODONE) 5 MG immediate release tablet Take 1 tablet (5 mg total) by mouth every 6 (six) hours as needed for moderate pain or severe pain. 10/03/16   Nishant Dhungel, MD  predniSONE (DELTASONE) 20 MG tablet Take 2 tablets (40 mg total) by mouth daily with breakfast. 10/04/16 10/09/16  Nishant Dhungel, MD  Protein (PROCEL) POWD Take 2 scoop by mouth 2 (two) times daily.    Historical Provider, MD    Physical Exam: Vitals:   10/03/16 1535 10/03/16 1545 10/03/16 1553 10/03/16 1559  BP: 114/62   112/61  Pulse: 108  103 102  Resp: 24  24 23   Temp: 98 F (36.7 C)   97.6 F (36.4 C)  TempSrc: Oral   Oral  SpO2: 90% 93% 93% 95%      Constitutional: NAD, calm, comfortable, nasal canula in place  Eyes: PERTLA, lids and conjunctivae normal ENMT: Mucous membranes are moist. Posterior pharynx clear of any exudate or lesions.   Neck:  normal, supple, no masses, no thyromegaly Respiratory: Prolonged expiratory phase with expiratory wheezes. Normal respiratory effort. No pallor.  Cardiovascular: Rate ~110 and regular. No pitting edema in LEs. No significant JVD. Abdomen: No distension, no tenderness, no masses palpated. Bowel sounds normal.  Musculoskeletal: no clubbing / cyanosis. Left calf larger than right, minimally tender, no palpable cord. Skin: no significant rashes, lesions, ulcers. Warm, dry, well-perfused. Neurologic: CN 2-12 grossly intact. Sensation intact, DTR normal. Strength 5/5 in all 4 limbs.  Psychiatric: Normal judgment and insight. Alert and oriented x 3. Normal mood and affect.     Labs on Admission: I have personally reviewed following labs and imaging studies  CBC:  Recent Labs Lab 09/29/16 1419 09/30/16 0411 10/03/16 1621  WBC 5.6 3.5* 8.9  NEUTROABS 4.7  --   --   HGB 9.6* 8.7* 8.6*  HCT 28.7* 26.6* 26.6*  MCV 102.5* 104.3* 101.9*  PLT 259 247  368   Basic Metabolic Panel:  Recent Labs Lab 09/29/16 1419 09/30/16 0411 10/03/16 1621  NA 134* 132* 137  K 3.7 4.0 3.9  CL 102 103 101  CO2 23 26 28   GLUCOSE 97 137* 183*  BUN 14 19 34*  CREATININE 0.89 0.79 1.23*  CALCIUM 8.2* 8.0* 8.2*   GFR: Estimated Creatinine Clearance: 30 mL/min (by C-G formula based on SCr of 1.23 mg/dL (H)). Liver Function Tests:  Recent Labs Lab 09/29/16 1419  AST 27  ALT 14  ALKPHOS 88  BILITOT 0.8  PROT 7.6  ALBUMIN 3.1*   No results for input(s): LIPASE, AMYLASE in the last 168 hours. No results for input(s): AMMONIA in the last 168 hours. Coagulation Profile: No results for input(s): INR, PROTIME in the last 168 hours. Cardiac Enzymes: No results for input(s): CKTOTAL, CKMB, CKMBINDEX, TROPONINI in the last 168 hours. BNP (last 3 results) No results for input(s): PROBNP in the last 8760 hours. HbA1C: No results for input(s): HGBA1C in the last 72 hours. CBG: No results for input(s):  GLUCAP in the last 168 hours. Lipid Profile: No results for input(s): CHOL, HDL, LDLCALC, TRIG, CHOLHDL, LDLDIRECT in the last 72 hours. Thyroid Function Tests: No results for input(s): TSH, T4TOTAL, FREET4, T3FREE, THYROIDAB in the last 72 hours. Anemia Panel: No results for input(s): VITAMINB12, FOLATE, FERRITIN, TIBC, IRON, RETICCTPCT in the last 72 hours. Urine analysis:    Component Value Date/Time   COLORURINE YELLOW 09/29/2016 1240   APPEARANCEUR HAZY (A) 09/29/2016 1240   LABSPEC 1.010 09/29/2016 1240   PHURINE 5.0 09/29/2016 1240   GLUCOSEU NEGATIVE 09/29/2016 1240   HGBUR SMALL (A) 09/29/2016 1240   BILIRUBINUR NEGATIVE 09/29/2016 1240   KETONESUR 5 (A) 09/29/2016 1240   PROTEINUR NEGATIVE 09/29/2016 1240   UROBILINOGEN 0.2 11/08/2009 1426   NITRITE NEGATIVE 09/29/2016 1240   LEUKOCYTESUR NEGATIVE 09/29/2016 1240   Sepsis Labs: @LABRCNTIP (procalcitonin:4,lacticidven:4) ) Recent Results (from the past 240 hour(s))  MRSA PCR Screening     Status: None   Collection Time: 09/29/16  7:03 PM  Result Value Ref Range Status   MRSA by PCR NEGATIVE NEGATIVE Final    Comment:        The GeneXpert MRSA Assay (FDA approved for NASAL specimens only), is one component of a comprehensive MRSA colonization surveillance program. It is not intended to diagnose MRSA infection nor to guide or monitor treatment for MRSA infections.      Radiological Exams on Admission: Ct Angio Chest Pe W And/or Wo Contrast  Result Date: 10/03/2016 CLINICAL DATA:  Increased difficulty breathing.  Short of breath. EXAM: CT ANGIOGRAPHY CHEST WITH CONTRAST TECHNIQUE: Multidetector CT imaging of the chest was performed using the standard protocol during bolus administration of intravenous contrast. Multiplanar CT image reconstructions and MIPs were obtained to evaluate the vascular anatomy. CONTRAST:  75 mL Isovue COMPARISON:  Radiograph 10/02/2016 FINDINGS: Cardiovascular: A thin tubular filling  defect within the LEFT upper lobe pulmonary artery (image 93, series 7). No additional pulmonary emboli. Overall clot burden is minimal. No RIGHT ventricular strain present. No pericardial fluid. No acute findings aorta great vessels. Mediastinum/Nodes: No axillary or supraclavicular lymphadenopathy. No mediastinal hilar adenopathy. No pericardial fluid. Lungs/Pleura: Centrilobular emphysema in the upper lobes. No pulmonary infarction. No airspace disease. No pleural fluid or pneumothorax Within the LEFT lower lobe 5 mm pulmonary nodule (image 32, series 8). Upper Abdomen: Limited view of the liver, kidneys, pancreas are unremarkable. Normal adrenal glands. Musculoskeletal: No aggressive osseous lesion. Review  of the MIP images confirms the above findings. IMPRESSION: 1. Small acute pulmonary embolism within the LEFT upper lobe pulmonary artery. Overall clot burden is minimal. No RIGHT ventricular strain evident. 2. Coronary artery calcification and aortic atherosclerotic calcification. 3. **An incidental finding of potential clinical significance has been found. ** Small LEFT lower lobe pulmonary nodule. No follow-up needed if patient is low-risk. Non-contrast chest CT can be considered in 12 months if patient is high-risk. This recommendation follows the consensus statement: Guidelines for Management of Incidental Pulmonary Nodules Detected on CT Images: From the Fleischner Society 2017; Radiology 2017; 284:228-243. Critical Value/emergent results were called by telephone at the time of interpretation on 10/03/2016 at 5:47 pm to Dr. Linwood Dibbles , who verbally acknowledged these results. Electronically Signed   By: Genevive Bi M.D.   On: 10/03/2016 17:49   Dg Chest Port 1 View  Result Date: 10/02/2016 CLINICAL DATA:  Shortness of breath. EXAM: PORTABLE CHEST 1 VIEW COMPARISON:  09/29/2016 FINDINGS: Lungs are hyperinflated. There is mild perihilar peribronchial thickening. No focal consolidations or pleural  effusions are identified. Chronic changes are identified in the left shoulder. IMPRESSION: Hyperinflation and bronchitic changes. No focal acute pulmonary abnormality. Electronically Signed   By: Norva Pavlov M.D.   On: 10/02/2016 16:23    EKG: Independently interpreted: Sinus rhythm, biatrial enlargement   Assessment/Plan  1. Acute pulmonary embolism - Pt recently hospitalized for 5 days; she received sq Lovenox injections for VTE ppx during the stay  - She felt much improved at time of discharge, but developed worsening hypoxia shortly after, prompting return to ED   - She was found to be hypoxic on her 3 Lpm supplemental oxygen with mild tachycardia, but stable BP  - CTA PE study reveals acute PE within LUL pulmonary artery without evidence for RV strain  - She has been started on heparin infusion in ED; this will be continue with much-appreciated pharmacy assistance  - Monitor on telemetry, titrate FiO2 to maintain sat >90% - There is asymmetric calf swelling and venous US is pending - Case management consultation requested for assistance with oral AC    2. COPD with chronic hypoxic respiratory failure  - Pt was recently discharged after acute management of COPD exacerbation  - She was discharged with a short-course of prednisone and new home O2  - There is wheezing and obstructive breathing on admission, but pt appears comfortable and denies SOB  - Plan to continue prednisone 40 mg qD, scheduled ICS/LABA, and DuoNeb    3. Chronic diastolic CHF - Pt appears euvolemic, maybe a little dry on admission  - She was diuresed recently in hospital  - TTE (09/30/16) with EF 60-65%, grade 1 diastolic dysfunction, mild LVH, no WMA, no significant valvular dz - She is managed with Lasix at home, held on admission given bump in SCr  - Follow daily wts and I/Os, SLIV, resume diuretic as appropriate    4. Acute kidney injury   - SCr is 1.23 on admission, up from 0.79 three days prior  -  Likely secondary to recent diuresis  - Lasix held on admission, avoid nephrotoxins and dehydration where possible, repeat chem panel in am    5. Macrocytic anemia - Hgb is 8.6 on admission, down from recent values in mid 9s  - Pt denies melena or hematochezia, and denies easy bruising or bleeding  - She takes ASA 81 mg qD  - Plan to check stool for occult blood, anemia panel, and repeat  H&H in am   6. AAA  - Recent discovery of 6.8 x 6.4 cm infrarenal AAA with mural thrombus  - Pt has outpatient follow-up scheduled with Dr. Darrick PennaFields of vascular   7. Incidental lung nodule  - Incidental notation is made of a small LLL pulmonary nodule on CTA   - 7544-month follow-up imaging with non-contrast CT may be indicated    DVT prophylaxis: heparin infusion per VTE treatment dosing Code Status: DNR  Family Communication: Discussed with patient Disposition Plan: Observe on telemetry Consults called: None Admission status: Observation    Briscoe Deutscherimothy S Luvenia Cranford, MD [facility] Pager [phone]  If 7PM-7AM, please contact night-coverage [email or web address] Password [email or web address]  10/03/2016, 7:05 PM

## 2016-10-03 NOTE — Progress Notes (Addendum)
ANTICOAGULATION CONSULT NOTE - Initial Consult  Pharmacy Consult for IV heparin Indication: PE  Allergies  Allergen Reactions  . Tylenol [Acetaminophen] Other (See Comments)    Reaction:  Shaking     Patient Measurements:   Heparin Dosing Weight: 65.7kg  Vital Signs: Temp: 97.6 F (36.4 C) (12/18 1559) Temp Source: Oral (12/18 1559) BP: 112/61 (12/18 1559) Pulse Rate: 102 (12/18 1559)  Labs:  Recent Labs  10/03/16 1621  HGB 8.6*  HCT 26.6*  PLT 368  CREATININE 1.23*    Estimated Creatinine Clearance: 30 mL/min (by C-G formula based on SCr of 1.23 mg/dL (H)).   Medical History: Past Medical History:  Diagnosis Date  . Carpal tunnel syndrome   . COPD (chronic obstructive pulmonary disease) (HCC)   . GERD (gastroesophageal reflux disease)   . Hyperlipidemia   . Hypertension   . Osteoarthritis    Assessment: [age] yoF admitted with AECOPD and s/p fall with CT A/P incidental finding of infrarenal AAA planning on discharge 12/18 when pt experienced respiratory distress.  CT chest shows small acute pulmonary embolism within the LEFT upper lobe pulmonary artery.  Pharmacy consulted to start IV heparin.    12/18:  SCr bumped, CrCl ~30 ml/min.   Hgb low, stable, plts WNL.   Last dose LMWH 40mg  on 12/17 @ 2030. Also on ASA / pletal (last doses 12/18 AM)  Goal of Therapy:  Heparin level 0.3-0.7 units/ml Monitor platelets by anticoagulation protocol: Yes   Plan:  IV heparin bolus 2000 units IV x1 (conservative 2/2 age, low hgb) IV heparin infusion 1100 units/hr (=6111ml/hr) F/u HL in 8 hours  Haynes Hoehnolleen Piya Mesch, PharmD, BCPS 10/03/2016, 6:06 PM  Pager: [phone]

## 2016-10-03 NOTE — ED Notes (Signed)
Granddaughter: [name] [phone] and Daughter [name] [phone]. Code word: [id number] [name] for xxx communication.

## 2016-10-03 NOTE — Progress Notes (Signed)
[facility] She is in agreement with this plan. Pt reports that she will contact her family directly. [facility] transport is required. Medical necessity form completed. D/C Summary sent to SNF for review. Scripts included in d/c packet. # for report provided to nsg.  Cori RazorJamie Rutherford Alarie LCSW [phone]

## 2016-10-03 NOTE — Care Management Important Message (Signed)
Important Message  Patient Details  Name: Carrie Pena MRN: [id number] Date of Birth: 1927/11/20   Medicare Important Message Given:  Yes    Caren MacadamFuller, Ervan Heber 10/03/2016, 10:12 AMImportant Message  Patient Details  Name: Carrie Pena MRN: [id number] Date of Birth: 1927/11/20   Medicare Important Message Given:  Yes    Caren MacadamFuller, Leola Fiore 10/03/2016, 10:11 AM

## 2016-10-03 NOTE — ED Notes (Signed)
Bed: RESA Expected date:  Expected time:  Means of arrival:  Comments: EMS resp distress 

## 2016-10-03 NOTE — Discharge Summary (Signed)
Physician Discharge Summary  Carrie Pena RUE:[id number] DOB: 1928-03-05 DOA: 09/29/2016  PCP: Thayer Headings, MD  Admit date: 09/29/2016 Discharge date: 10/03/2016  Admitted From: SNF [facility]) Disposition:  Return back to SNF  Recommendations for Outpatient Follow-up:  1. Follow up with M.D. at SNF in 1 week. Being discharged on 5 more days of oral prednisone (stop date 12/23) 2. Patient has follow-up with vascular surgeon Dr. Darrick Penna on 11/03/2016 at 12:45 PM regarding her peripheral vascular disease. He will address patient's new findings of abdominal aortic aneurysm during the visit. 3. Patient is being discharged on continuous oxygen (2 L via nasal cannula).    Equipment/Devices: Oxygen (2 L via nasal cannula)  Discharge Condition: Fair CODE STATUS: DO NOT RESUSCITATE Diet recommendation: Heart healthy    Discharge Diagnoses:  Principal Problem:   COPD with acute exacerbation (HCC)   Active Problems:   Essential hypertension, benign   Iliopsoas muscle hematoma, left, initial encounter   Macrocytic anemia   COPD (chronic obstructive pulmonary disease) (HCC)   Chronic diastolic heart failure (HCC)   AAA (abdominal aortic aneurysm) without rupture (HCC)   Acute respiratory failure with hypoxia (HCC)   Anxiety state  Brief narrative/history of present illness Please refer to admission H&P for details, in brief,82 year old female with diastolic CHF, COPD not on home O2, hypertension,  GERD, PAD, recent ileo psoas muscle hematoma after sustaining fall at home. She was monitored in the hospital and discharged to SNF. Patient reportedly had fallen at SNF few days back and landed on her left side. For past 3 days she was also having worsening productive cough with increased dyspnea requiring oxygen. Patient sent to the ED for these symptoms.  In the ED she was afebrile, requiring 4 L via nasal cannula and hypertensive. Blood work was unremarkable. Chest x-ray was  negative for infiltrate. Admitted to hospitalist service for COPD exacerbation.  Hospital course  Principal Problem:   COPD with acute exacerbation (HCC) -Received Solu-Medrol in the ED, transitioned to oral prednisone. Continue DuoNeb's and home inhaler.  -Patient requiring oxygen (2 L via nasal cannula continuously). She will be discharged on this. Will discharge her on 5 more days of oral prednisone. Continue home inhaler and when necessary DuoNeb's. -Also added bedside spirometry.  Active Problems:  Fall with left quadrant discomfort, infrarenal AAA Nontender on exam. CT abd and plavis shows incidental finding of infrarenal AA measuring 6.8x 6.4 cm with mural thrombus. D/w Dr Imogene Burn. Recommended no acute intervention and have her follow up with Dr Darrick Penna ( who she followed previously) once acute illness resolved.   she has an appointment to see Dr. Darrick Penna.  ?Progressive dementia Patient's daughter and granddaughter concerned that she gets increasingly forgetful and unable to keep track of her medications. This has been progressive or past several months. Psych consulted who recommended pt has capacity to make complex decisons. Recent B12 was normal. TSH normal  , HIV and RPR negative. Patient gets extremely anxious and I have added low-dose Xanax. Once acute issues resolve please obtain a Mini-Mental exam to assess for underlying dementia.  Acute on chronic diastolic CHF Noted for increased leg swellings (this is new finding as per family).echo shows EF of 60-65% with gr 1 diastolic dysfn. added daily Lasix.     Essential hypertension, benign Stable. Continue home medications.    Iliopsoas muscle hematoma, left,  Non evident on repeat CT.  Weakness with frequent falls Continue physical therapy at facility.      Family Communication  :.  Updated granddaughter on the phone.  Disposition Plan  : Return to SNF   Consults  :   Psychiatry  Procedures  :  CT abd  and pelvis   Discharge Instructions   Allergies as of 10/03/2016      Reactions   Tylenol [acetaminophen] Other (See Comments)   Reaction:  Shaking       Medication List    TAKE these medications   alendronate 70 MG tablet Commonly known as:  FOSAMAX Take 70 mg by mouth every Thursday. Take with a full glass of water on an empty stomach.   ALPRAZolam 0.25 MG tablet Commonly known as:  XANAX Take 1 tablet (0.25 mg total) by mouth 3 (three) times daily as needed for anxiety.   amLODipine 10 MG tablet Commonly known as:  NORVASC Take 1 tablet (10 mg total) by mouth daily.   aspirin EC 81 MG tablet Take 81 mg by mouth daily.   budesonide-formoterol 160-4.5 MCG/ACT inhaler Commonly known as:  SYMBICORT Inhale 2 puffs into the lungs 2 (two) times daily.   cilostazol 100 MG tablet Commonly known as:  PLETAL Take 100 mg by mouth 2 (two) times daily.   furosemide 40 MG tablet Commonly known as:  LASIX Take 1 tablet (40 mg total) by mouth daily. Start taking on:  10/04/2016   ICY HOT 5 % Ptch Generic drug:  Menthol Apply 1 application topically daily. Pt applies to left upper arm every morning and removes at bedtime.   ipratropium-albuterol 0.5-2.5 (3) MG/3ML Soln Commonly known as:  DUONEB Take 3 mLs by nebulization every 4 (four) hours as needed (for wheezing/shortness of breath).   NUTRITIONAL SUPPLEMENT PLUS Liqd Take 120 mLs by mouth 2 (two) times daily between meals. Med Pass   oxyCODONE 5 MG immediate release tablet Commonly known as:  Oxy IR/ROXICODONE Take 1 tablet (5 mg total) by mouth every 6 (six) hours as needed for moderate pain or severe pain. What changed:  when to take this   polyethylene glycol packet Commonly known as:  MIRALAX / GLYCOLAX Take 17 g by mouth 2 (two) times daily.   predniSONE 20 MG tablet Commonly known as:  DELTASONE Take 2 tablets (40 mg total) by mouth daily with breakfast. Start taking on:  10/04/2016   PROCEL Powd Take  2 scoop by mouth 2 (two) times daily.   senna-docusate 8.6-50 MG tablet Commonly known as:  Senokot-S Take 2 tablets by mouth 2 (two) times daily.   simvastatin 40 MG tablet Commonly known as:  ZOCOR Take 40 mg by mouth daily at 6 PM.      Follow-up Information    MD at SNF in 1 week Follow up in 1 week(s).        Fabienne BrunsFields, Charles, MD Follow up.   Specialties:  Vascular Surgery, Cardiology Why:  has appt next month Contact information: [address] [place] [address] [phone]          Allergies  Allergen Reactions  . Tylenol [Acetaminophen] Other (See Comments)    Reaction:  Shaking       Procedures/Studies: Dg Chest 2 View  Result Date: 09/29/2016 CLINICAL DATA: Chest pain EXAM: CHEST  2 VIEW COMPARISON:  September 01, 2016 FINDINGS: There is slight scarring in the right base. There is no edema or consolidation. Heart size and pulmonary vascularity are normal. No adenopathy. There is atherosclerotic calcification in the aorta. Aorta is tortuous but stable. There is arthropathy in each shoulder. IMPRESSION: No edema  or consolidation. Slight scarring right base. Stable cardiac silhouette. There aorta is tortuous with aortic atherosclerosis. Electronically Signed   By: Bretta Bang III M.D.   On: 09/29/2016 13:30   Ct Abdomen Pelvis W Contrast  Result Date: 09/30/2016 CLINICAL DATA:  History of iliopsoas hematoma, recent falls with left-sided pain, initial encounter EXAM: CT ABDOMEN AND PELVIS WITH CONTRAST TECHNIQUE: Multidetector CT imaging of the abdomen and pelvis was performed using the standard protocol following bolus administration of intravenous contrast. CONTRAST:  75 mL Isovue-300 COMPARISON:  09/01/2016. FINDINGS: Lower chest: Bilateral lower lobe atelectasis. No sizable effusion is seen. Small hiatal hernia is noted. Hepatobiliary: No focal liver abnormality is seen. No gallstones, gallbladder wall thickening, or biliary dilatation. Pancreas:  Unremarkable. No pancreatic ductal dilatation or surrounding inflammatory changes. Spleen: Multiple calcified granulomas are noted. The spleen is otherwise within normal limits. Adrenals/Urinary Tract: Renal vascular calcifications are noted. No calculi are seen. No obstructive changes are noted. The adrenal glands show mild fullness on the left likely related to hyperplasia. Stomach/Bowel: Diffuse diverticular change of the colon is noted. The appendix is within normal limits. No inflammatory changes are noted. Vascular/Lymphatic: Diffuse aortic calcifications are noted. There are changes consistent within infrarenal aortic aneurysm. It measures 6.8 x 6.4 cm in greatest AP and transverse dimensions respectively. Significant mural thrombus is identified no extravasation is identified. The aneurysm extends to the aortic bifurcation. Diffuse calcifications are noted throughout the iliac vessels without aneurysmal dilatation. Reproductive: Uterus and bilateral adnexa are unremarkable. Other: Left inguinal hernia is noted with multiple loops of small bowel within. No obstructive changes are seen. This is stable from the prior exam. Musculoskeletal: Degenerative changes of lumbar spine are seen. IMPRESSION: Mild bibasilar atelectatic changes without pleural effusion. Abdominal aortic aneurysm as described. Vascular surgery consultation recommended due to increased risk of rupture for AAA >5.5 cm. This recommendation follows ACR consensus guidelines: White Paper of the ACR Incidental Findings Committee II on Vascular Findings. J Am Coll Radiol 2013; 10:789-794. Diverticulosis without diverticulitis. Left inguinal hernia containing small bowel loops without incarceration. Electronically Signed   By: Alcide Clever M.D.   On: 09/30/2016 15:56   Dg Chest Port 1 View  Result Date: 10/02/2016 CLINICAL DATA:  Shortness of breath. EXAM: PORTABLE CHEST 1 VIEW COMPARISON:  09/29/2016 FINDINGS: Lungs are hyperinflated. There is  mild perihilar peribronchial thickening. No focal consolidations or pleural effusions are identified. Chronic changes are identified in the left shoulder. IMPRESSION: Hyperinflation and bronchitic changes. No focal acute pulmonary abnormality. Electronically Signed   By: Norva Pavlov M.D.   On: 10/02/2016 16:23    2-D echo  Study Conclusions  - Left ventricle: The cavity size was normal. Wall thickness was   increased in a pattern of mild LVH. Systolic function was normal.   The estimated ejection fraction was in the range of 60% to 65%.   Wall motion was normal; there were no regional wall motion   abnormalities. Doppler parameters are consistent with abnormal   left ventricular relaxation (grade 1 diastolic dysfunction).   Doppler parameters are consistent with indeterminate ventricular   filling pressure. - Aortic valve: Transvalvular velocity was within the normal range.   There was no stenosis. There was no regurgitation. - Mitral valve: Transvalvular velocity was within the normal range.   There was no evidence for stenosis. There was no regurgitation. - Right ventricle: The cavity size was normal. Wall thickness was   normal. Systolic function was normal. - Tricuspid valve: There was trivial  regurgitation. - Pulmonary arteries: Systolic pressure was within the normal   range. PA peak pressure: 32 mm Hg (S).   Subjective: Patient desatted to 80s and required up to 4 L last evening. Chest x-ray showed possible atelectasis. Given her dose of IV Lasix and ordered bedside spirometry. This morning she is maintaining O2 sat in low 90s on 2 L via nasal cannula. Reports her breathing to be much better.  Discharge Exam: Vitals:   10/02/16 2027 10/03/16 0433  BP: 128/66 117/60  Pulse: 88 86  Resp: 18 16  Temp: 97.6 F (36.4 C) 97.6 F (36.4 C)   Vitals:   10/02/16 1334 10/02/16 2027 10/03/16 0433 10/03/16 0852  BP: (!) 119/53 128/66 117/60   Pulse: (!) 101 88 86   Resp: 18  18 16    Temp:  97.6 F (36.4 C) 97.6 F (36.4 C)   TempSrc: Oral Oral Oral   SpO2: 94% 93% 96% 99%  Weight:      Height:        General: Elderly female not in distress, anxious to be discharged HEENT: Moist mucosa, supple neck Cardiovascular: Normal S1 and S2, no murmurs rub or gallop Chest: Clear to exam bilaterally, wheezing resolved, no rhonchi or crackles Abdominal: Soft, NT, ND, bowel sounds + Musculoskeletal: Warm, no edema CNS: Alert and oriented, anxious    The results of significant diagnostics from this hospitalization (including imaging, microbiology, ancillary and laboratory) are listed below for reference.     Microbiology: Recent Results (from the past 240 hour(s))  MRSA PCR Screening     Status: None   Collection Time: 09/29/16  7:03 PM  Result Value Ref Range Status   MRSA by PCR NEGATIVE NEGATIVE Final    Comment:        The GeneXpert MRSA Assay (FDA approved for NASAL specimens only), is one component of a comprehensive MRSA colonization surveillance program. It is not intended to diagnose MRSA infection nor to guide or monitor treatment for MRSA infections.      Labs: BNP (last 3 results)  Recent Labs  09/03/16 1358 09/29/16 1419  BNP 82.2 38.3   Basic Metabolic Panel:  Recent Labs Lab 09/29/16 1419 09/30/16 0411  NA 134* 132*  K 3.7 4.0  CL 102 103  CO2 23 26  GLUCOSE 97 137*  BUN 14 19  CREATININE 0.89 0.79  CALCIUM 8.2* 8.0*   Liver Function Tests:  Recent Labs Lab 09/29/16 1419  AST 27  ALT 14  ALKPHOS 88  BILITOT 0.8  PROT 7.6  ALBUMIN 3.1*   No results for input(s): LIPASE, AMYLASE in the last 168 hours. No results for input(s): AMMONIA in the last 168 hours. CBC:  Recent Labs Lab 09/29/16 1419 09/30/16 0411  WBC 5.6 3.5*  NEUTROABS 4.7  --   HGB 9.6* 8.7*  HCT 28.7* 26.6*  MCV 102.5* 104.3*  PLT 259 247   Cardiac Enzymes: No results for input(s): CKTOTAL, CKMB, CKMBINDEX, TROPONINI in the last 168  hours. BNP: Invalid input(s): POCBNP CBG: No results for input(s): GLUCAP in the last 168 hours. D-Dimer No results for input(s): DDIMER in the last 72 hours. Hgb A1c No results for input(s): HGBA1C in the last 72 hours. Lipid Profile No results for input(s): CHOL, HDL, LDLCALC, TRIG, CHOLHDL, LDLDIRECT in the last 72 hours. Thyroid function studies No results for input(s): TSH, T4TOTAL, T3FREE, THYROIDAB in the last 72 hours.  Invalid input(s): FREET3 Anemia work up No results for input(s): VITAMINB12, FOLATE,  FERRITIN, TIBC, IRON, RETICCTPCT in the last 72 hours. Urinalysis    Component Value Date/Time   COLORURINE YELLOW 09/29/2016 1240   APPEARANCEUR HAZY (A) 09/29/2016 1240   LABSPEC 1.010 09/29/2016 1240   PHURINE 5.0 09/29/2016 1240   GLUCOSEU NEGATIVE 09/29/2016 1240   HGBUR SMALL (A) 09/29/2016 1240   BILIRUBINUR NEGATIVE 09/29/2016 1240   KETONESUR 5 (A) 09/29/2016 1240   PROTEINUR NEGATIVE 09/29/2016 1240   UROBILINOGEN 0.2 11/08/2009 1426   NITRITE NEGATIVE 09/29/2016 1240   LEUKOCYTESUR NEGATIVE 09/29/2016 1240   Sepsis Labs Invalid input(s): PROCALCITONIN,  WBC,  LACTICIDVEN Microbiology Recent Results (from the past 240 hour(s))  MRSA PCR Screening     Status: None   Collection Time: 09/29/16  7:03 PM  Result Value Ref Range Status   MRSA by PCR NEGATIVE NEGATIVE Final    Comment:        The GeneXpert MRSA Assay (FDA approved for NASAL specimens only), is one component of a comprehensive MRSA colonization surveillance program. It is not intended to diagnose MRSA infection nor to guide or monitor treatment for MRSA infections.      Time coordinating discharge: Over 30 minutes  SIGNED:   Eddie North, MD  [facility] 10/03/2016, 11:21 AM Pager   If 7PM-7AM, please contact night-coverage [email or web address] Password [email or web address]

## 2016-10-04 ENCOUNTER — Observation Stay (HOSPITAL_BASED_OUTPATIENT_CLINIC_OR_DEPARTMENT_OTHER): Payer: Medicare Other

## 2016-10-04 DIAGNOSIS — R531 Weakness: Secondary | ICD-10-CM | POA: Diagnosis not present

## 2016-10-04 DIAGNOSIS — Z809 Family history of malignant neoplasm, unspecified: Secondary | ICD-10-CM | POA: Diagnosis not present

## 2016-10-04 DIAGNOSIS — Z9981 Dependence on supplemental oxygen: Secondary | ICD-10-CM | POA: Diagnosis not present

## 2016-10-04 DIAGNOSIS — Z8249 Family history of ischemic heart disease and other diseases of the circulatory system: Secondary | ICD-10-CM | POA: Diagnosis not present

## 2016-10-04 DIAGNOSIS — M6281 Muscle weakness (generalized): Secondary | ICD-10-CM | POA: Diagnosis not present

## 2016-10-04 DIAGNOSIS — N179 Acute kidney failure, unspecified: Secondary | ICD-10-CM | POA: Diagnosis present

## 2016-10-04 DIAGNOSIS — J441 Chronic obstructive pulmonary disease with (acute) exacerbation: Secondary | ICD-10-CM | POA: Diagnosis not present

## 2016-10-04 DIAGNOSIS — I5032 Chronic diastolic (congestive) heart failure: Secondary | ICD-10-CM

## 2016-10-04 DIAGNOSIS — I2699 Other pulmonary embolism without acute cor pulmonale: Secondary | ICD-10-CM | POA: Diagnosis not present

## 2016-10-04 DIAGNOSIS — I714 Abdominal aortic aneurysm, without rupture: Secondary | ICD-10-CM | POA: Diagnosis present

## 2016-10-04 DIAGNOSIS — K219 Gastro-esophageal reflux disease without esophagitis: Secondary | ICD-10-CM | POA: Diagnosis present

## 2016-10-04 DIAGNOSIS — R0902 Hypoxemia: Secondary | ICD-10-CM | POA: Diagnosis not present

## 2016-10-04 DIAGNOSIS — Z87891 Personal history of nicotine dependence: Secondary | ICD-10-CM | POA: Diagnosis not present

## 2016-10-04 DIAGNOSIS — J9621 Acute and chronic respiratory failure with hypoxia: Secondary | ICD-10-CM | POA: Diagnosis not present

## 2016-10-04 DIAGNOSIS — Z7982 Long term (current) use of aspirin: Secondary | ICD-10-CM | POA: Diagnosis not present

## 2016-10-04 DIAGNOSIS — E785 Hyperlipidemia, unspecified: Secondary | ICD-10-CM | POA: Diagnosis present

## 2016-10-04 DIAGNOSIS — E860 Dehydration: Secondary | ICD-10-CM | POA: Diagnosis present

## 2016-10-04 DIAGNOSIS — R278 Other lack of coordination: Secondary | ICD-10-CM | POA: Diagnosis not present

## 2016-10-04 DIAGNOSIS — D539 Nutritional anemia, unspecified: Secondary | ICD-10-CM

## 2016-10-04 DIAGNOSIS — I739 Peripheral vascular disease, unspecified: Secondary | ICD-10-CM | POA: Diagnosis present

## 2016-10-04 DIAGNOSIS — J189 Pneumonia, unspecified organism: Secondary | ICD-10-CM | POA: Diagnosis not present

## 2016-10-04 DIAGNOSIS — Z66 Do not resuscitate: Secondary | ICD-10-CM | POA: Diagnosis present

## 2016-10-04 DIAGNOSIS — R296 Repeated falls: Secondary | ICD-10-CM | POA: Diagnosis not present

## 2016-10-04 DIAGNOSIS — Z79899 Other long term (current) drug therapy: Secondary | ICD-10-CM | POA: Diagnosis not present

## 2016-10-04 DIAGNOSIS — R739 Hyperglycemia, unspecified: Secondary | ICD-10-CM | POA: Diagnosis present

## 2016-10-04 DIAGNOSIS — I110 Hypertensive heart disease with heart failure: Secondary | ICD-10-CM | POA: Diagnosis present

## 2016-10-04 LAB — BASIC METABOLIC PANEL
Anion gap: 5 (ref 5–15)
BUN: 28 mg/dL — AB (ref 6–20)
CHLORIDE: 104 mmol/L (ref 101–111)
CO2: 29 mmol/L (ref 22–32)
CREATININE: 1 mg/dL (ref 0.44–1.00)
Calcium: 8 mg/dL — ABNORMAL LOW (ref 8.9–10.3)
GFR calc Af Amer: 57 mL/min — ABNORMAL LOW (ref >60)
GFR calc non Af Amer: 50 mL/min — ABNORMAL LOW (ref >60)
Glucose, Bld: 108 mg/dL — ABNORMAL HIGH (ref 65–99)
Potassium: 3.6 mmol/L (ref 3.5–5.1)
Sodium: 138 mmol/L (ref 135–145)

## 2016-10-04 LAB — HEPARIN LEVEL (UNFRACTIONATED)
Heparin Unfractionated: 0.57 IU/mL (ref 0.30–0.70)
Heparin Unfractionated: 0.55 IU/mL (ref 0.30–0.70)

## 2016-10-04 LAB — HEMOGLOBIN: Hemoglobin: 8.1 g/dL — ABNORMAL LOW (ref 12.0–15.0)

## 2016-10-04 LAB — GLUCOSE, CAPILLARY: Glucose-Capillary: 79 mg/dL (ref 65–99)

## 2016-10-04 LAB — HEMATOCRIT: HEMATOCRIT: 24.8 % — AB (ref 36.0–46.0)

## 2016-10-04 MED ORDER — IPRATROPIUM-ALBUTEROL 0.5-2.5 (3) MG/3ML IN SOLN
3.0000 mL | Freq: Four times a day (QID) | RESPIRATORY_TRACT | Status: DC
  Administered 2016-10-04 – 2016-10-05 (×4): 3 mL via RESPIRATORY_TRACT
  Filled 2016-10-04 (×4): qty 3

## 2016-10-04 MED ORDER — ATORVASTATIN CALCIUM 20 MG PO TABS
20.0000 mg | ORAL_TABLET | Freq: Every day | ORAL | Status: DC
  Administered 2016-10-04: 20 mg via ORAL
  Filled 2016-10-04: qty 1

## 2016-10-04 MED ORDER — LORAZEPAM 2 MG/ML IJ SOLN: 1.0000 mg | Freq: Four times a day (QID) | INTRAMUSCULAR | Status: DC | PRN

## 2016-10-04 NOTE — Progress Notes (Signed)
ANTICOAGULATION CONSULT NOTE - F/u Consult  Pharmacy Consult for IV heparin Indication: PE  Allergies  Allergen Reactions  . Tylenol [Acetaminophen] Other (See Comments)    Reaction:  Shaking     Patient Measurements: Height: 5\' 3"  (160 cm) Weight: 144 lb 6.4 oz (65.5 kg) IBW/kg (Calculated) : 52.4 Heparin Dosing Weight: 65.7kg  Vital Signs: Temp: 98 F (36.7 C) (12/18 2057) Temp Source: Oral (12/18 2057) BP: 124/73 (12/18 2057) Pulse Rate: 88 (12/18 2057)  Labs:  Recent Labs  10/03/16 1621 10/04/16 0333  HGB 8.6* 8.1*  HCT 26.6* 24.8*  PLT 368  --   HEPARINUNFRC  --  0.57  CREATININE 1.23* 1.00    Estimated Creatinine Clearance: 36.7 mL/min (by C-G formula based on SCr of 1 mg/dL).   Medical History: Past Medical History:  Diagnosis Date  . Carpal tunnel syndrome   . COPD (chronic obstructive pulmonary disease) (HCC)   . GERD (gastroesophageal reflux disease)   . Hyperlipidemia   . Hypertension   . Osteoarthritis    Assessment: [age] yoF admitted with AECOPD and s/p fall with CT A/P incidental finding of infrarenal AAA planning on discharge 12/18 when pt experienced respiratory distress.  CT chest shows small acute pulmonary embolism within the LEFT upper lobe pulmonary artery.  Pharmacy consulted to start IV heparin.    12/18:  SCr bumped, CrCl ~30 ml/min.   Hgb low, stable, plts WNL.   Last dose LMWH 40mg  on 12/17 @ 2030. Also on ASA / pletal (last doses 12/18 AM) Today, 12/19 0333 HL=0.57 , no infusion or bleeding issues per RN  Goal of Therapy:  Heparin level 0.3-0.7 units/ml Monitor platelets by anticoagulation protocol: Yes   Plan:   Continue  heparin infusion at 1100 units/hr (=6611ml/hr) Recheck HL at noon daily Daily HL and CBC  Lorenza EvangelistGreen, Yassir Enis R 10/04/2016, 4:19 AM

## 2016-10-04 NOTE — Progress Notes (Addendum)
*  Preliminary Results* Bilateral lower extremity venous duplex completed. The right lower extremity is negative for deep vein thrombosis.  The left lower extremity is positive for acute deep vein thrombosis involving the left posterior tibial veins. There is no evidence of Baker's cyst bilaterally.  Preliminary results discussed with Cicero DuckErika, RN and Dr. Elisabeth Pigeonevine.  10/04/2016 10:30 AM Gertie FeyMichelle Darrek Leasure, BS, RVT, RDCS, RDMS

## 2016-10-04 NOTE — Progress Notes (Addendum)
Chaplain provided emotional and spiritual support at bedside at pt request / RN referral.   Carrie Pena is working to stay in a positive frame of mind.  Spoke with chaplain about not being sure why she was admitted to hospital and hope that she will be able to return to [facility] soon.    She is supported by 4 children.  Daughter and [place] and Granddaughter in [place] play a large role in support.  She is [place] and attends [employer].  Asked chaplain to inform church members that she is in hospital.    Shared that she loves to read, but has not been able to in hospital due to having to sit up straight and not use arm.  She states she has been passing time by watching news and sleeping.   Shared prayers at he request.     Belva CromeStalnaker, Josealfredo Adkins Wayne MDiv

## 2016-10-04 NOTE — Progress Notes (Addendum)
Patient ID: Carrie Pena, female   DOB: 07/26/1928, 82 y.o.   MRN: [id number]  PROGRESS NOTE    TESSLA SPURLING  WUJ:[id number] DOB: Jun 28, 1928 DOA: 10/03/2016  PCP: Thayer Headings, MD   Brief Narrative:  82 y.o. female with medical history significant for COPD with chronic hypoxic respiratory failure, chronic diastolic CHF (2D ECHO 09/30/2016 showed EF 60%, grade 1 DD), peripheral arterial disease and hypertension who presented for evaluation of hypoxia. Patient had just been admitted 5 days prior for acute hypoxic respiratory failure and was treated for a COPD exacerbation and acute on chronic CHF. She made good improvement during hospitalization and was discharged earlier today with new home O2 and a short course of prednisone. Shortly after discharge, patient noted to be increasingly hypoxic and she ultimately returned to the ED for evaluation of this.   In ED, patient was afebrile, saturating 90% on 3 L/m supplemental oxygen, tachycardic in the low 100s, vitals otherwise stable. Troponin and BNP were within the normal limits. Blood work was notable for Cr of 1.23, hemoglobin was 8.6.  CTA PE study was obtained in the emergency department and revealed a small acute pulmonary embolism in the left upper lobe pulmonary artery without evidence for right heart strain. Patient was started on heparin infusion in the emergency department.    Assessment & Plan:   Principal Problem:   Acute pulmonary embolism (HCC) / Acute on chronic respiratory failure with hypoxia (HCC) / Acute COPD exacerbation - Hypoxia likely in the setting of acute PE and acute COPD exacerbation  - Continue heparin drip for now - Continue duoneb every 6 hours scheduled and every 4 hours PRN shortness of breath or wheezing  - Continue daily prednisone - Monitor CBC daily  Active Problems:   Macrocytic anemia - Monitor CBC daily as pt on heparin drip    Chronic diastolic CHF (congestive heart failure) (HCC) -  Stable - Last 2 D ECHO 09/30/2016 showed EF 60%, grade 1 DD)    AKI (acute kidney injury) (HCC) - Likely dehydration - Cr normalized with IV fluids       Dyslipidemia - Continue Lipitor 20 mg at bedtime    PVD - Continue aspirin and pletal     Essential hypertension - Continue Norvasc 10 mg daily    DVT prophylaxis: Heparin drip  Code Status:  DNR/DNI Family Communication: spoke with granddaughter and daughter over the phone today  Disposition Plan: to SNF in next 3 days once transitioned to PO anticoagulation    Consultants:   None   Procedures:   LE doppler 12/19 - positive for DVT in left tibial vein  Antimicrobials:   None     Subjective: No overnight events.   Objective: Vitals:   10/03/16 2057 10/03/16 2150 10/04/16 0519 10/04/16 1041  BP: 124/73  122/68   Pulse: 88  73   Resp: 20  20   Temp: 98 F (36.7 C)  98.6 F (37 C)   TempSrc: Oral  Oral   SpO2: 95% 95% 95% (!) 87%  Weight: 65.5 kg (144 lb 6.4 oz)  65.4 kg (144 lb 2.9 oz)   Height: 5\' 3"  (1.6 m)       Intake/Output Summary (Last 24 hours) at 10/04/16 1124 Last data filed at 10/04/16 0600  Gross per 24 hour  Intake           305.95 ml  Output  0 ml  Net           305.95 ml   Filed Weights   10/03/16 2057 10/04/16 0519  Weight: 65.5 kg (144 lb 6.4 oz) 65.4 kg (144 lb 2.9 oz)    Examination:  General exam: Appears calm and comfortable  Respiratory system: Rhonchorous, wheezing in upper lung lobes  Cardiovascular system: S1 & S2 heard, RRR. No pedal edema. Gastrointestinal system: Abdomen is nondistended, soft and nontender. No organomegaly or masses felt. Normal bowel sounds heard. Central nervous system: No focal neurological deficits. Extremities: Symmetric 5 x 5 power. Skin: No rashes, lesions or ulcers Psychiatry: Mood & affect appropriate.   Data Reviewed: I have personally reviewed following labs and imaging studies  CBC:  Recent Labs Lab 09/29/16 1419  09/30/16 0411 10/03/16 1621 10/04/16 0333  WBC 5.6 3.5* 8.9  --   NEUTROABS 4.7  --   --   --   HGB 9.6* 8.7* 8.6* 8.1*  HCT 28.7* 26.6* 26.6* 24.8*  MCV 102.5* 104.3* 101.9*  --   PLT 259 247 368  --    Basic Metabolic Panel:  Recent Labs Lab 09/29/16 1419 09/30/16 0411 10/03/16 1621 10/04/16 0333  NA 134* 132* 137 138  K 3.7 4.0 3.9 3.6  CL 102 103 101 104  CO2 23 26 28 29   GLUCOSE 97 137* 183* 108*  BUN 14 19 34* 28*  CREATININE 0.89 0.79 1.23* 1.00  CALCIUM 8.2* 8.0* 8.2* 8.0*   GFR: Estimated Creatinine Clearance: 36.7 mL/min (by C-G formula based on SCr of 1 mg/dL). Liver Function Tests:  Recent Labs Lab 09/29/16 1419  AST 27  ALT 14  ALKPHOS 88  BILITOT 0.8  PROT 7.6  ALBUMIN 3.1*   No results for input(s): LIPASE, AMYLASE in the last 168 hours. No results for input(s): AMMONIA in the last 168 hours. Coagulation Profile: No results for input(s): INR, PROTIME in the last 168 hours. Cardiac Enzymes: No results for input(s): CKTOTAL, CKMB, CKMBINDEX, TROPONINI in the last 168 hours. BNP (last 3 results) No results for input(s): PROBNP in the last 8760 hours. HbA1C: No results for input(s): HGBA1C in the last 72 hours. CBG:  Recent Labs Lab 10/04/16 0748  GLUCAP 79   Lipid Profile: No results for input(s): CHOL, HDL, LDLCALC, TRIG, CHOLHDL, LDLDIRECT in the last 72 hours. Thyroid Function Tests: No results for input(s): TSH, T4TOTAL, FREET4, T3FREE, THYROIDAB in the last 72 hours. Anemia Panel:  Recent Labs  10/03/16 1630 10/03/16 2130  VITAMINB12 679  --   FOLATE 12.1  --   FERRITIN 139  --   TIBC 255  --   IRON 33  --   RETICCTPCT  --  3.6*   Urine analysis:    Component Value Date/Time   COLORURINE YELLOW 09/29/2016 1240   APPEARANCEUR HAZY (A) 09/29/2016 1240   LABSPEC 1.010 09/29/2016 1240   PHURINE 5.0 09/29/2016 1240   GLUCOSEU NEGATIVE 09/29/2016 1240   HGBUR SMALL (A) 09/29/2016 1240   BILIRUBINUR NEGATIVE 09/29/2016  1240   KETONESUR 5 (A) 09/29/2016 1240   PROTEINUR NEGATIVE 09/29/2016 1240   UROBILINOGEN 0.2 11/08/2009 1426   NITRITE NEGATIVE 09/29/2016 1240   LEUKOCYTESUR NEGATIVE 09/29/2016 1240   Sepsis Labs: @LABRCNTIP (procalcitonin:4,lacticidven:4)    MRSA PCR Screening     Status: None   Collection Time: 09/29/16  7:03 PM  Result Value Ref Range Status   MRSA by PCR NEGATIVE NEGATIVE Final  MRSA PCR Screening     Status: None  Collection Time: 10/03/16  9:05 PM  Result Value Ref Range Status   MRSA by PCR NEGATIVE NEGATIVE Final      Radiology Studies: Ct Angio Chest Pe W And/or Wo Contrast Result Date: 10/03/2016 1. Small acute pulmonary embolism within the LEFT upper lobe pulmonary artery. Overall clot burden is minimal. No RIGHT ventricular strain evident. 2. Coronary artery calcification and aortic atherosclerotic calcification. 3. **An incidental finding of potential clinical significance has been found. ** Small LEFT lower lobe pulmonary nodule. No follow-up needed if patient is low-risk. Non-contrast chest CT can be considered in 12 months if patient is high-risk. This recommendation follows the consensus statement: Guidelines for Management of Incidental Pulmonary Nodules Detected on CT Images: From the Fleischner Society 2017; Radiology 2017; 284:228-243. Critical Value/emergent results were called by telephone at the time of interpretation on 10/03/2016 at 5:47 pm to Dr. Linwood DibblesJON KNAPP , who verbally acknowledged these results. Electronically Signed   By: Genevive BiStewart  Edmunds M.D.   On: 10/03/2016 17:49   Ct Abdomen Pelvis W Contrast Result Date: 09/30/2016 Mild bibasilar atelectatic changes without pleural effusion. Abdominal aortic aneurysm as described. Vascular surgery consultation recommended due to increased risk of rupture for AAA >5.5 cm. This recommendation follows ACR consensus guidelines: White Paper of the ACR Incidental Findings Committee II on Vascular Findings. J Am Coll  Radiol 2013; 10:789-794. Diverticulosis without diverticulitis. Left inguinal hernia containing small bowel loops without incarceration. Electronically Signed   By: Alcide CleverMark  Lukens M.D.   On: 09/30/2016 15:56   Dg Chest Port 1 View Result Date: 10/02/2016 Hyperinflation and bronchitic changes. No focal acute pulmonary abnormality.     Scheduled Meds: . amLODipine  10 mg Oral Daily  . aspirin EC  81 mg Oral Daily  . atorvastatin  20 mg Oral q1800  . cilostazol  100 mg Oral BID  . feeding supplement   237 mL Oral BID BM  . ipratropium-albutero  3 mL Nebulization Once  . mometasone-form  2 puff Inhalation BID  . polyethylene glycol  17 g Oral BID  . predniSONE  40 mg Oral Q breakfast  . senna-docusate  2 tablet Oral BID   Continuous Infusions: . heparin 1,100 Units/hr (10/03/16 1833)     LOS: 0 days    Time spent: 25 minutes  Greater than 50% of the time spent on counseling and coordinating the care.   Manson PasseyEVINE, Vertie Dibbern, MD [facility] Pager [phone]  If 7PM-7AM, please contact night-coverage [email or web address] Password [email or web address] 10/04/2016, 11:24 AM

## 2016-10-04 NOTE — Progress Notes (Signed)
ANTICOAGULATION CONSULT NOTE - F/u Consult  Pharmacy Consult for IV heparin Indication: PE  Allergies  Allergen Reactions  . Tylenol [Acetaminophen] Other (See Comments)    Reaction:  Shaking     Patient Measurements: Height: 5\' 3"  (160 cm) Weight: 144 lb 2.9 oz (65.4 kg) IBW/kg (Calculated) : 52.4 Heparin Dosing Weight: 65.7kg  Vital Signs: Temp: 98.6 F (37 C) (12/19 0519) Temp Source: Oral (12/19 0519) BP: 122/68 (12/19 0519) Pulse Rate: 73 (12/19 0519)  Labs:  Recent Labs  10/03/16 1621 10/04/16 0333 10/04/16 1151  HGB 8.6* 8.1*  --   HCT 26.6* 24.8*  --   PLT 368  --   --   HEPARINUNFRC  --  0.57 0.55  CREATININE 1.23* 1.00  --     Estimated Creatinine Clearance: 36.7 mL/min (by C-G formula based on SCr of 1 mg/dL).   Medical History: Past Medical History:  Diagnosis Date  . Carpal tunnel syndrome   . COPD (chronic obstructive pulmonary disease) (HCC)   . GERD (gastroesophageal reflux disease)   . Hyperlipidemia   . Hypertension   . Osteoarthritis    Assessment: [age] yoF admitted with AECOPD and s/p fall with CT A/P incidental finding of infrarenal AAA planning on discharge 12/18 when pt experienced respiratory distress.  CT chest shows small acute pulmonary embolism within the LEFT upper lobe pulmonary artery. LLE doppler (+) DVT. Pharmacy consulted to start IV heparin.    Today, 10/04/2016  Heparin level therapeutic (0.55) on 1100 units/hr  CBC: Hgb low 8.1, Plts wnl, no bleeding reported  Concurrent aspirin/pletal for PAD noted  Goal of Therapy:  Heparin level 0.3-0.7 units/ml Monitor platelets by anticoagulation protocol: Yes   Plan:   Continue heparin infusion at 1100 units/hr (611ml/hr)  Daily heparin level and CBC  Carrie Pena, PharmD, BCPS Pager: [phone] 10/04/2016, 12:13 PM

## 2016-10-05 DIAGNOSIS — E8809 Other disorders of plasma-protein metabolism, not elsewhere classified: Secondary | ICD-10-CM | POA: Diagnosis present

## 2016-10-05 DIAGNOSIS — Z86711 Personal history of pulmonary embolism: Secondary | ICD-10-CM | POA: Diagnosis not present

## 2016-10-05 DIAGNOSIS — I2699 Other pulmonary embolism without acute cor pulmonale: Secondary | ICD-10-CM | POA: Diagnosis not present

## 2016-10-05 DIAGNOSIS — J189 Pneumonia, unspecified organism: Secondary | ICD-10-CM | POA: Diagnosis not present

## 2016-10-05 DIAGNOSIS — R609 Edema, unspecified: Secondary | ICD-10-CM | POA: Diagnosis not present

## 2016-10-05 DIAGNOSIS — I10 Essential (primary) hypertension: Secondary | ICD-10-CM | POA: Diagnosis not present

## 2016-10-05 DIAGNOSIS — Z66 Do not resuscitate: Secondary | ICD-10-CM | POA: Diagnosis present

## 2016-10-05 DIAGNOSIS — M6281 Muscle weakness (generalized): Secondary | ICD-10-CM | POA: Diagnosis not present

## 2016-10-05 DIAGNOSIS — M1990 Unspecified osteoarthritis, unspecified site: Secondary | ICD-10-CM | POA: Diagnosis present

## 2016-10-05 DIAGNOSIS — Z79899 Other long term (current) drug therapy: Secondary | ICD-10-CM | POA: Diagnosis not present

## 2016-10-05 DIAGNOSIS — R0902 Hypoxemia: Secondary | ICD-10-CM | POA: Diagnosis not present

## 2016-10-05 DIAGNOSIS — Z809 Family history of malignant neoplasm, unspecified: Secondary | ICD-10-CM | POA: Diagnosis not present

## 2016-10-05 DIAGNOSIS — N179 Acute kidney failure, unspecified: Secondary | ICD-10-CM | POA: Diagnosis present

## 2016-10-05 DIAGNOSIS — N289 Disorder of kidney and ureter, unspecified: Secondary | ICD-10-CM | POA: Diagnosis not present

## 2016-10-05 DIAGNOSIS — J440 Chronic obstructive pulmonary disease with acute lower respiratory infection: Secondary | ICD-10-CM | POA: Diagnosis present

## 2016-10-05 DIAGNOSIS — Z7901 Long term (current) use of anticoagulants: Secondary | ICD-10-CM | POA: Diagnosis not present

## 2016-10-05 DIAGNOSIS — Z87891 Personal history of nicotine dependence: Secondary | ICD-10-CM | POA: Diagnosis not present

## 2016-10-05 DIAGNOSIS — D62 Acute posthemorrhagic anemia: Secondary | ICD-10-CM | POA: Diagnosis present

## 2016-10-05 DIAGNOSIS — I714 Abdominal aortic aneurysm, without rupture: Secondary | ICD-10-CM | POA: Diagnosis present

## 2016-10-05 DIAGNOSIS — I82402 Acute embolism and thrombosis of unspecified deep veins of left lower extremity: Secondary | ICD-10-CM | POA: Diagnosis not present

## 2016-10-05 DIAGNOSIS — D638 Anemia in other chronic diseases classified elsewhere: Secondary | ICD-10-CM | POA: Diagnosis not present

## 2016-10-05 DIAGNOSIS — R278 Other lack of coordination: Secondary | ICD-10-CM | POA: Diagnosis not present

## 2016-10-05 DIAGNOSIS — Z79891 Long term (current) use of opiate analgesic: Secondary | ICD-10-CM | POA: Diagnosis not present

## 2016-10-05 DIAGNOSIS — K219 Gastro-esophageal reflux disease without esophagitis: Secondary | ICD-10-CM | POA: Diagnosis not present

## 2016-10-05 DIAGNOSIS — Z7951 Long term (current) use of inhaled steroids: Secondary | ICD-10-CM | POA: Diagnosis not present

## 2016-10-05 DIAGNOSIS — I739 Peripheral vascular disease, unspecified: Secondary | ICD-10-CM | POA: Diagnosis not present

## 2016-10-05 DIAGNOSIS — E785 Hyperlipidemia, unspecified: Secondary | ICD-10-CM | POA: Diagnosis not present

## 2016-10-05 DIAGNOSIS — I5032 Chronic diastolic (congestive) heart failure: Secondary | ICD-10-CM | POA: Diagnosis not present

## 2016-10-05 DIAGNOSIS — J441 Chronic obstructive pulmonary disease with (acute) exacerbation: Secondary | ICD-10-CM | POA: Diagnosis not present

## 2016-10-05 DIAGNOSIS — R05 Cough: Secondary | ICD-10-CM | POA: Diagnosis not present

## 2016-10-05 DIAGNOSIS — K5900 Constipation, unspecified: Secondary | ICD-10-CM | POA: Diagnosis not present

## 2016-10-05 DIAGNOSIS — E440 Moderate protein-calorie malnutrition: Secondary | ICD-10-CM | POA: Diagnosis not present

## 2016-10-05 DIAGNOSIS — D649 Anemia, unspecified: Secondary | ICD-10-CM | POA: Diagnosis not present

## 2016-10-05 DIAGNOSIS — R040 Epistaxis: Secondary | ICD-10-CM | POA: Diagnosis not present

## 2016-10-05 DIAGNOSIS — R531 Weakness: Secondary | ICD-10-CM | POA: Diagnosis not present

## 2016-10-05 DIAGNOSIS — R296 Repeated falls: Secondary | ICD-10-CM | POA: Diagnosis not present

## 2016-10-05 DIAGNOSIS — J449 Chronic obstructive pulmonary disease, unspecified: Secondary | ICD-10-CM | POA: Diagnosis not present

## 2016-10-05 DIAGNOSIS — Z9981 Dependence on supplemental oxygen: Secondary | ICD-10-CM | POA: Diagnosis not present

## 2016-10-05 DIAGNOSIS — R918 Other nonspecific abnormal finding of lung field: Secondary | ICD-10-CM | POA: Diagnosis not present

## 2016-10-05 DIAGNOSIS — R5381 Other malaise: Secondary | ICD-10-CM | POA: Diagnosis not present

## 2016-10-05 DIAGNOSIS — R710 Precipitous drop in hematocrit: Secondary | ICD-10-CM | POA: Diagnosis not present

## 2016-10-05 DIAGNOSIS — Z7982 Long term (current) use of aspirin: Secondary | ICD-10-CM | POA: Diagnosis not present

## 2016-10-05 DIAGNOSIS — J9621 Acute and chronic respiratory failure with hypoxia: Secondary | ICD-10-CM | POA: Diagnosis not present

## 2016-10-05 DIAGNOSIS — I110 Hypertensive heart disease with heart failure: Secondary | ICD-10-CM | POA: Diagnosis not present

## 2016-10-05 DIAGNOSIS — Z8249 Family history of ischemic heart disease and other diseases of the circulatory system: Secondary | ICD-10-CM | POA: Diagnosis not present

## 2016-10-05 DIAGNOSIS — M810 Age-related osteoporosis without current pathological fracture: Secondary | ICD-10-CM | POA: Diagnosis not present

## 2016-10-05 DIAGNOSIS — R29898 Other symptoms and signs involving the musculoskeletal system: Secondary | ICD-10-CM | POA: Diagnosis not present

## 2016-10-05 DIAGNOSIS — R0602 Shortness of breath: Secondary | ICD-10-CM | POA: Diagnosis present

## 2016-10-05 LAB — CBC
HEMATOCRIT: 25.7 % — AB (ref 36.0–46.0)
HEMOGLOBIN: 8.3 g/dL — AB (ref 12.0–15.0)
MCH: 34 pg (ref 26.0–34.0)
MCHC: 32.3 g/dL (ref 30.0–36.0)
MCV: 105.3 fL — AB (ref 78.0–100.0)
Platelets: 338 10*3/uL (ref 150–400)
RBC: 2.44 MIL/uL — ABNORMAL LOW (ref 3.87–5.11)
RDW: 16.1 % — ABNORMAL HIGH (ref 11.5–15.5)
WBC: 9.5 10*3/uL (ref 4.0–10.5)

## 2016-10-05 LAB — GLUCOSE, CAPILLARY: GLUCOSE-CAPILLARY: 125 mg/dL — AB (ref 65–99)

## 2016-10-05 LAB — HEPARIN LEVEL (UNFRACTIONATED): Heparin Unfractionated: 0.63 IU/mL (ref 0.30–0.70)

## 2016-10-05 MED ORDER — RIVAROXABAN (XARELTO) EDUCATION KIT FOR DVT/PE PATIENTS
PACK | Freq: Once | Status: AC
  Administered 2016-10-05: 11:00:00
  Filled 2016-10-05: qty 1

## 2016-10-05 MED ORDER — OMEPRAZOLE 20 MG PO CPDR: 20.0000 mg | DELAYED_RELEASE_CAPSULE | Freq: Every day | ORAL | 0 refills | Status: DC

## 2016-10-05 MED ORDER — ORAL CARE MOUTH RINSE: 15.0000 mL | Freq: Two times a day (BID) | OROMUCOSAL | Status: DC

## 2016-10-05 MED ORDER — RIVAROXABAN 15 MG PO TABS: 15.0000 mg | ORAL_TABLET | Freq: Two times a day (BID) | ORAL | 0 refills | Status: DC

## 2016-10-05 MED ORDER — RIVAROXABAN 15 MG PO TABS
15.0000 mg | ORAL_TABLET | Freq: Two times a day (BID) | ORAL | Status: DC
  Administered 2016-10-05: 15 mg via ORAL
  Filled 2016-10-05: qty 1

## 2016-10-05 NOTE — Consult Note (Signed)
   [facility]   10/05/2016  [name] May 08, 1928 [id number]    Ms. Carrie ArenaMeyers [facility]. Spoke with inpatient LCSW who indicates Ms. Carrie PriceMyers will be [facility]. Ms. Carrie ArenaMeyers has been [facility]. Please see chart review then notes for patient outreach details. Will update [facility] Licensed CSW.    Raiford NobleAtika Tremane Spurgeon, MSN-Ed, RN,BSN [facility] Hospital Liaison [phone]

## 2016-10-05 NOTE — Progress Notes (Addendum)
ANTICOAGULATION CONSULT NOTE - F/u Consult  Pharmacy Consult for IV heparin Indication: PE  Allergies  Allergen Reactions  . Tylenol [Acetaminophen] Other (See Comments)    Reaction:  Shaking     Patient Measurements: Height: 5\' 3"  (160 cm) Weight: 144 lb 2.9 oz (65.4 kg) IBW/kg (Calculated) : 52.4 Heparin Dosing Weight: 65.7kg  Vital Signs: Temp: 98.3 F (36.8 C) (12/20 0605) Temp Source: Oral (12/20 0605) BP: 114/57 (12/20 0605) Pulse Rate: 81 (12/20 0605)  Labs:  Recent Labs  10/03/16 1621 10/04/16 0333 10/04/16 1151 10/05/16 0545  HGB 8.6* 8.1*  --  8.3*  HCT 26.6* 24.8*  --  25.7*  PLT 368  --   --  338  HEPARINUNFRC  --  0.57 0.55 0.63  CREATININE 1.23* 1.00  --   --     Estimated Creatinine Clearance: 36.7 mL/min (by C-G formula based on SCr of 1 mg/dL).   Medical History: Past Medical History:  Diagnosis Date  . Carpal tunnel syndrome   . COPD (chronic obstructive pulmonary disease) (HCC)   . GERD (gastroesophageal reflux disease)   . Hyperlipidemia   . Hypertension   . Osteoarthritis    Assessment: [age] yoF admitted with AECOPD and s/p fall with CT A/P incidental finding of infrarenal AAA planning on discharge 12/18 when pt experienced respiratory distress.  CT chest shows small acute pulmonary embolism within the LEFT upper lobe pulmonary artery. LLE doppler (+) DVT. Pharmacy consulted to start IV heparin.    Today, 10/05/2016  Heparin level therapeutic (0.63) on 1100 units/hr    CBC: Hgb low 8.3 but stable from admission, Plts wnl, no bleeding reported  Concurrent aspirin/pletal for PAD noted  Goal of Therapy:  Heparin level 0.3-0.7 units/ml Monitor platelets by anticoagulation protocol: Yes   Plan:   Continue heparin infusion at 1100 units/hr   Daily heparin level and CBC  Follow up plans for transition to long-term oral anticoagulation  Loralee PacasErin Delford Wingert, PharmD, BCPS Pager: [phone] 10/05/2016, 7:25 AM   Addendum: Consulted to  transition to Xarelto 15mg  BID x 21days, then 20mg  daily. Education offered prior to discharge.  Loralee PacasErin Shawntez Dickison, PharmD, BCPS 10/05/2016 9:59 AM

## 2016-10-05 NOTE — Progress Notes (Signed)
[facility]. Patient & [name], granddaughter/HCPOA made aware. Discharge packet given to RN, Shae. [facility] called for transport.     Lincoln MaxinKelly Nyela Cortinas, LCSW [facility] Clinical Social Worker cell #: [phone]

## 2016-10-05 NOTE — Discharge Summary (Signed)
Physician Discharge Summary  Carrie Pena XBM:[id number] DOB: [birth year] DOA: 10/03/2016  PCP: Thayer Headings, MD  Admit date: 10/03/2016 Discharge date: 10/05/2016  Recommendations for Outpatient Follow-up:  Please take xarelto 15 mg twice a day for 21 days (through 10/26/2016), then starting 10/27/2016 take xarelto 20 mg a day.  Discharge Diagnoses:  Principal Problem:   Acute pulmonary embolism (HCC) Active Problems:   Acute on chronic respiratory failure with hypoxia (HCC)   Macrocytic anemia   Chronic diastolic CHF (congestive heart failure) (HCC)   AKI (acute kidney injury) (HCC)   COPD (chronic obstructive pulmonary disease) (HCC)   AAA (abdominal aortic aneurysm) without rupture (HCC)   Hyperglycemia    Discharge Condition: stable; pt insisting on being discharged today. She is oriented to time, place and person. I spoke with her granddaughter and she was okay for discharge today.   Diet recommendation: as tolerated   History of present illness:   82 y.o.femalewith medical history significant for COPD with chronic hypoxic respiratory failure, chronic diastolic CHF (2D ECHO 09/30/2016 showed EF 60%, grade 1 DD), peripheral arterial disease and hypertension who presented for evaluation of hypoxia. Patient had just been admitted 5 days prior for acute hypoxic respiratory failure and was treated for a COPD exacerbation and acute on chronic CHF. She made good improvement during hospitalization and was discharged earlier today with new home O2 and a short course of prednisone. Shortly after discharge, patient noted to be increasingly hypoxic and she ultimately returned to the ED for evaluation of this.   In ED, patient was afebrile, saturating 90% on 3 L/m supplemental oxygen, tachycardic in the low 100s, vitals otherwise stable. Troponin and BNP were within the normal limits. Blood work was notable for Cr of 1.23, hemoglobin was 8.6.  CTA PE study was obtained in the  emergency department and revealed a small acute pulmonary embolism in the left upper lobe pulmonary artery without evidence for right heart strain. Patient was started on heparin infusion in the emergency department.   Hospital Course:   Principal Problem:   Acute pulmonary embolism (HCC) / Acute on chronic respiratory failure with hypoxia (HCC) / Acute COPD exacerbation - Hypoxia likely in the setting of acute PE and acute COPD exacerbation  - Stop heparin - Start xarelto. Please take xarelto 15 mg twice a day for 21 days (through 10/26/2016), then starting 10/27/2016 take xarelto 20 mg a day. - Continue nebulizer treatment as prescribed  - Continue daily prednisone  Active Problems:   Macrocytic anemia - Hgb stable     Chronic diastolic CHF (congestive heart failure) (HCC) - Stable - Last 2 D ECHO 09/30/2016 showed EF 60%, grade 1 DD)    AKI (acute kidney injury) (HCC) - Likely dehydration - Cr normalized with IV fluids       Dyslipidemia - Continue Lipitor 20 mg at bedtime    PVD - Continue aspirin and pletal     Essential hypertension - Continue Norvasc 10 mg daily    DVT prophylaxis: Heparin drip --> change to xarelto prior to discharge  Code Status:  DNR/DNI Family Communication: spoke with granddaughter over the phone today     Consultants:   None   Procedures:   LE doppler 12/19 - positive for DVT in left tibial vein  Antimicrobials:   None     Signed:  Manson Passey, MD  [facility] 10/05/2016, 1:13 PM  Pager #: [phone]  Time spent in minutes: less than 30 minutes   Discharge Exam:  Vitals:   10/04/16 2043 10/05/16 0605  BP: (!) 122/59 (!) 114/57  Pulse: 92 81  Resp: 18   Temp: 98.1 F (36.7 C) 98.3 F (36.8 C)   Vitals:   10/04/16 2043 10/05/16 0605 10/05/16 0810 10/05/16 1205  BP: (!) 122/59 (!) 114/57    Pulse: 92 81    Resp: 18     Temp: 98.1 F (36.7 C) 98.3 F (36.8 C)    TempSrc: Oral Oral    SpO2:  93% 95% 95% 90%  Weight:      Height:        General: Pt is alert, follows commands appropriately, not in acute distress Cardiovascular: Regular rate and rhythm, S1/S2 + Respiratory: Clear to auscultation bilaterally, no wheezing, no crackles, no rhonchi Abdominal: Soft, non tender, non distended, bowel sounds +, no guarding Extremities: no cyanosis, pulses palpable bilaterally DP and PT Neuro: Grossly nonfocal  Discharge Instructions  Discharge Instructions    Call MD for:  persistant nausea and vomiting    Complete by:  As directed    Call MD for:  redness, tenderness, or signs of infection (pain, swelling, redness, odor or green/yellow discharge around incision site)    Complete by:  As directed    Call MD for:  severe uncontrolled pain    Complete by:  As directed    Diet - low sodium heart healthy    Complete by:  As directed    Discharge instructions    Complete by:  As directed    Please take xarelto 15 mg twice a day for 21 days (through 10/26/2016), then starting 10/27/2016 take xarelto 20 mg a day.   Increase activity slowly    Complete by:  As directed      Allergies as of 10/05/2016      Reactions   Tylenol [acetaminophen] Other (See Comments)   Reaction:  Shaking       Medication List    STOP taking these medications   ALPRAZolam 0.25 MG tablet Commonly known as:  XANAX   furosemide 40 MG tablet Commonly known as:  LASIX   oxyCODONE 5 MG immediate release tablet Commonly known as:  Oxy IR/ROXICODONE     TAKE these medications   alendronate 70 MG tablet Commonly known as:  FOSAMAX Take 70 mg by mouth every Thursday. Take with a full glass of water on an empty stomach.   amLODipine 10 MG tablet Commonly known as:  NORVASC Take 1 tablet (10 mg total) by mouth daily.   aspirin EC 81 MG tablet Take 81 mg by mouth daily.   atorvastatin 20 MG tablet Commonly known as:  LIPITOR Take 20 mg by mouth daily at 6 PM.   budesonide-formoterol 160-4.5  MCG/ACT inhaler Commonly known as:  SYMBICORT Inhale 2 puffs into the lungs 2 (two) times daily.   cilostazol 100 MG tablet Commonly known as:  PLETAL Take 100 mg by mouth 2 (two) times daily.   DULERA 200-5 MCG/ACT Aero Generic drug:  mometasone-formoterol Inhale 2 puffs into the lungs 2 (two) times daily.   ICY HOT 5 % Ptch Generic drug:  Menthol Apply 1 application topically daily. Pt applies to left upper arm every morning and removes at bedtime.   ipratropium-albuterol 0.5-2.5 (3) MG/3ML Soln Commonly known as:  DUONEB Take 3 mLs by nebulization every 4 (four) hours as needed (for wheezing/shortness of breath).   NUTRITIONAL SUPPLEMENT PLUS Liqd Take 120 mLs by mouth 2 (two) times daily between meals. Med  Pass   omeprazole 20 MG capsule Commonly known as:  PRILOSEC Take 1 capsule (20 mg total) by mouth daily.   polyethylene glycol packet Commonly known as:  MIRALAX / GLYCOLAX Take 17 g by mouth 2 (two) times daily.   predniSONE 20 MG tablet Commonly known as:  DELTASONE Take 2 tablets (40 mg total) by mouth daily with breakfast.   PROCEL Powd Take 2 scoop by mouth 2 (two) times daily.   Rivaroxaban 15 MG Tabs tablet Commonly known as:  XARELTO Take 1 tablet (15 mg total) by mouth 2 (two) times daily with a meal.   senna-docusate 8.6-50 MG tablet Commonly known as:  Senokot-S Take 2 tablets by mouth 2 (two) times daily.   simvastatin 40 MG tablet Commonly known as:  ZOCOR Take 40 mg by mouth daily at 6 PM.       Follow-up Information    MACKENZIE,[name], MD. Schedule an appointment as soon as possible for a visit in 1 week(s).   Specialty:  Internal Medicine Contact information: [address], [address] [place] [address] [phone]            The results of significant diagnostics from this hospitalization (including imaging, microbiology, ancillary and laboratory) are listed below for reference.    Significant Diagnostic  Studies: Dg Chest 2 View  Result Date: 09/29/2016 CLINICAL DATA: Chest pain EXAM: CHEST  2 VIEW COMPARISON:  September 01, 2016 FINDINGS: There is slight scarring in the right base. There is no edema or consolidation. Heart size and pulmonary vascularity are normal. No adenopathy. There is atherosclerotic calcification in the aorta. Aorta is tortuous but stable. There is arthropathy in each shoulder. IMPRESSION: No edema or consolidation. Slight scarring right base. Stable cardiac silhouette. There aorta is tortuous with aortic atherosclerosis. Electronically Signed   By: Bretta BangWilliam  Woodruff III M.D.   On: 09/29/2016 13:30   Ct Angio Chest Pe W And/or Wo Contrast  Result Date: 10/03/2016 CLINICAL DATA:  Increased difficulty breathing.  Short of breath. EXAM: CT ANGIOGRAPHY CHEST WITH CONTRAST TECHNIQUE: Multidetector CT imaging of the chest was performed using the standard protocol during bolus administration of intravenous contrast. Multiplanar CT image reconstructions and MIPs were obtained to evaluate the vascular anatomy. CONTRAST:  75 mL Isovue COMPARISON:  Radiograph 10/02/2016 FINDINGS: Cardiovascular: A thin tubular filling defect within the LEFT upper lobe pulmonary artery (image 93, series 7). No additional pulmonary emboli. Overall clot burden is minimal. No RIGHT ventricular strain present. No pericardial fluid. No acute findings aorta great vessels. Mediastinum/Nodes: No axillary or supraclavicular lymphadenopathy. No mediastinal hilar adenopathy. No pericardial fluid. Lungs/Pleura: Centrilobular emphysema in the upper lobes. No pulmonary infarction. No airspace disease. No pleural fluid or pneumothorax Within the LEFT lower lobe 5 mm pulmonary nodule (image 32, series 8). Upper Abdomen: Limited view of the liver, kidneys, pancreas are unremarkable. Normal adrenal glands. Musculoskeletal: No aggressive osseous lesion. Review of the MIP images confirms the above findings. IMPRESSION: 1. Small acute  pulmonary embolism within the LEFT upper lobe pulmonary artery. Overall clot burden is minimal. No RIGHT ventricular strain evident. 2. Coronary artery calcification and aortic atherosclerotic calcification. 3. **An incidental finding of potential clinical significance has been found. ** Small LEFT lower lobe pulmonary nodule. No follow-up needed if patient is low-risk. Non-contrast chest CT can be considered in 12 months if patient is high-risk. This recommendation follows the consensus statement: Guidelines for Management of Incidental Pulmonary Nodules Detected on CT Images: From the Fleischner Society 2017; Radiology 2017;  [id number]:[id number]. Critical Value/emergent results were called by telephone at the time of interpretation on 10/03/2016 at 5:47 pm to Dr. Linwood DibblesJON KNAPP , who verbally acknowledged these results. Electronically Signed   By: Genevive BiStewart  Edmunds M.D.   On: 10/03/2016 17:49   Ct Abdomen Pelvis W Contrast  Result Date: 09/30/2016 CLINICAL DATA:  History of iliopsoas hematoma, recent falls with left-sided pain, initial encounter EXAM: CT ABDOMEN AND PELVIS WITH CONTRAST TECHNIQUE: Multidetector CT imaging of the abdomen and pelvis was performed using the standard protocol following bolus administration of intravenous contrast. CONTRAST:  75 mL Isovue-300 COMPARISON:  09/01/2016. FINDINGS: Lower chest: Bilateral lower lobe atelectasis. No sizable effusion is seen. Small hiatal hernia is noted. Hepatobiliary: No focal liver abnormality is seen. No gallstones, gallbladder wall thickening, or biliary dilatation. Pancreas: Unremarkable. No pancreatic ductal dilatation or surrounding inflammatory changes. Spleen: Multiple calcified granulomas are noted. The spleen is otherwise within normal limits. Adrenals/Urinary Tract: Renal vascular calcifications are noted. No calculi are seen. No obstructive changes are noted. The adrenal glands show mild fullness on the left likely related to hyperplasia. Stomach/Bowel:  Diffuse diverticular change of the colon is noted. The appendix is within normal limits. No inflammatory changes are noted. Vascular/Lymphatic: Diffuse aortic calcifications are noted. There are changes consistent within infrarenal aortic aneurysm. It measures 6.8 x 6.4 cm in greatest AP and transverse dimensions respectively. Significant mural thrombus is identified no extravasation is identified. The aneurysm extends to the aortic bifurcation. Diffuse calcifications are noted throughout the iliac vessels without aneurysmal dilatation. Reproductive: Uterus and bilateral adnexa are unremarkable. Other: Left inguinal hernia is noted with multiple loops of small bowel within. No obstructive changes are seen. This is stable from the prior exam. Musculoskeletal: Degenerative changes of lumbar spine are seen. IMPRESSION: Mild bibasilar atelectatic changes without pleural effusion. Abdominal aortic aneurysm as described. Vascular surgery consultation recommended due to increased risk of rupture for AAA >5.5 cm. This recommendation follows ACR consensus guidelines: White Paper of the ACR Incidental Findings Committee II on Vascular Findings. J Am Coll Radiol 2013; 10:789-794. Diverticulosis without diverticulitis. Left inguinal hernia containing small bowel loops without incarceration. Electronically Signed   By: Alcide CleverMark  Lukens M.D.   On: 09/30/2016 15:56   Dg Chest Port 1 View  Result Date: 10/02/2016 CLINICAL DATA:  Shortness of breath. EXAM: PORTABLE CHEST 1 VIEW COMPARISON:  09/29/2016 FINDINGS: Lungs are hyperinflated. There is mild perihilar peribronchial thickening. No focal consolidations or pleural effusions are identified. Chronic changes are identified in the left shoulder. IMPRESSION: Hyperinflation and bronchitic changes. No focal acute pulmonary abnormality. Electronically Signed   By: Norva PavlovElizabeth  Brown M.D.   On: 10/02/2016 16:23    Microbiology: Recent Results (from the past 240 hour(s))  MRSA PCR  Screening     Status: None   Collection Time: 09/29/16  7:03 PM  Result Value Ref Range Status   MRSA by PCR NEGATIVE NEGATIVE Final    Comment:        The GeneXpert MRSA Assay (FDA approved for NASAL specimens only), is one component of a comprehensive MRSA colonization surveillance program. It is not intended to diagnose MRSA infection nor to guide or monitor treatment for MRSA infections.   MRSA PCR Screening     Status: None   Collection Time: 10/03/16  9:05 PM  Result Value Ref Range Status   MRSA by PCR NEGATIVE NEGATIVE Final    Comment:        The GeneXpert MRSA Assay (FDA approved for NASAL specimens only),  is one component of a comprehensive MRSA colonization surveillance program. It is not intended to diagnose MRSA infection nor to guide or monitor treatment for MRSA infections.      Labs: Basic Metabolic Panel:  Recent Labs Lab 09/29/16 1419 09/30/16 0411 10/03/16 1621 10/04/16 0333  NA 134* 132* 137 138  K 3.7 4.0 3.9 3.6  CL 102 103 101 104  CO2 23 26 28 29   GLUCOSE 97 137* 183* 108*  BUN 14 19 34* 28*  CREATININE 0.89 0.79 1.23* 1.00  CALCIUM 8.2* 8.0* 8.2* 8.0*   Liver Function Tests:  Recent Labs Lab 09/29/16 1419  AST 27  ALT 14  ALKPHOS 88  BILITOT 0.8  PROT 7.6  ALBUMIN 3.1*   No results for input(s): LIPASE, AMYLASE in the last 168 hours. No results for input(s): AMMONIA in the last 168 hours. CBC:  Recent Labs Lab 09/29/16 1419 09/30/16 0411 10/03/16 1621 10/04/16 0333 10/05/16 0545  WBC 5.6 3.5* 8.9  --  9.5  NEUTROABS 4.7  --   --   --   --   HGB 9.6* 8.7* 8.6* 8.1* 8.3*  HCT 28.7* 26.6* 26.6* 24.8* 25.7*  MCV 102.5* 104.3* 101.9*  --  105.3*  PLT 259 247 368  --  338   Cardiac Enzymes: No results for input(s): CKTOTAL, CKMB, CKMBINDEX, TROPONINI in the last 168 hours. BNP: BNP (last 3 results)  Recent Labs  09/03/16 1358 09/29/16 1419 10/03/16 1621  BNP 82.2 38.3 48.8    ProBNP (last 3 results) No  results for input(s): PROBNP in the last 8760 hours.  CBG:  Recent Labs Lab 10/04/16 0748 10/05/16 0826  GLUCAP 79 125*

## 2016-10-05 NOTE — Progress Notes (Signed)
[facility]. CSW also spoke with RN about patient. Family notified of patient's transfer.

## 2016-10-05 NOTE — Discharge Instructions (Signed)
.Rivaroxaban oral tablets °What is this medicine? °RIVAROXABAN (ri va ROX a ban) is an anticoagulant (blood thinner). It is used to treat blood clots in the lungs or in the veins. It is also used after knee or hip surgeries to prevent blood clots. It is also used to lower the chance of stroke in people with a medical condition called atrial fibrillation. °COMMON BRAND NAME(S): Xarelto, Xarelto Starter Pack °What should I tell my health care provider before I take this medicine? °They need to know if you have any of these conditions: °-bleeding disorders °-bleeding in the brain °-blood in your stools (black or tarry stools) or if you have blood in your vomit °-history of stomach bleeding °-kidney disease °-liver disease °-low blood counts, like low white cell, platelet, or red cell counts °-recent or planned spinal or epidural procedure °-take medicines that treat or prevent blood clots °-an unusual or allergic reaction to rivaroxaban, other medicines, foods, dyes, or preservatives °-pregnant or trying to get pregnant °-breast-feeding °How should I use this medicine? °Take this medicine by mouth with a glass of water. Follow the directions on the prescription label. Take your medicine at regular intervals. Do not take it more often than directed. Do not stop taking except on your doctor's advice. Stopping this medicine may increase your risk of a blood clot. Be sure to refill your prescription before you run out of medicine. °If you are taking this medicine after hip or knee replacement surgery, take it with or without food. If you are taking this medicine for atrial fibrillation, take it with your evening meal. If you are taking this medicine to treat blood clots, take it with food at the same time each day. If you are unable to swallow your tablet, you may crush the tablet and mix it in applesauce. Then, immediately eat the applesauce. You should eat more food right after you eat the applesauce containing the  crushed tablet. °Talk to your pediatrician regarding the use of this medicine in children. Special care may be needed. °What if I miss a dose? °If you take your medicine once a day and miss a dose, take the missed dose as soon as you remember. If you take your medicine twice a day and miss a dose, take the missed dose immediately. In this instance, 2 tablets may be taken at the same time. The next day you should take 1 tablet twice a day as directed. °What may interact with this medicine? °Do not take this medicine with any of the following medications: °-defibrotideThis medicine may also interact with the following medications: °-aspirin and aspirin-like medicines °-certain antibiotics like erythromycin, azithromycin, and clarithromycin °-certain medicines for fungal infections like ketoconazole and itraconazole °-certain medicines for irregular heart beat like amiodarone, quinidine, dronedarone °-certain medicines for seizures like carbamazepine, phenytoin °-certain medicines that treat or prevent blood clots like warfarin, enoxaparin, and dalteparin °-conivaptan °-diltiazem °-felodipine °-indinavir °-lopinavir; ritonavir °-NSAIDS, medicines for pain and inflammation, like ibuprofen or naproxen °-ranolazine °-rifampin °-ritonavir °-SNRIs, medicines for depression, like desvenlafaxine, duloxetine, levomilnacipran, venlafaxine °-SSRIs, medicines for depression, like citalopram, escitalopram, fluoxetine, fluvoxamine, paroxetine, sertraline °-St. John's wort °-verapamil °What should I watch for while using this medicine? °Visit your doctor or health care professional for regular checks on your progress. Your condition will be monitored carefully while you are receiving this medicine. °Notify your doctor or health care professional and seek emergency treatment if you develop breathing problems; changes in vision; chest pain; severe, sudden headache; pain, swelling, warmth in   the leg; trouble speaking; sudden numbness  or weakness of the face, arm, or leg. These can be signs that your condition has gotten worse. °If you are going to have surgery, tell your doctor or health care professional that you are taking this medicine. °Tell your health care professional that you use this medicine before you have a spinal or epidural procedure. Sometimes people who take this medicine have bleeding problems around the spine when they have a spinal or epidural procedure. This bleeding is very rare. If you have a spinal or epidural procedure while on this medicine, call your health care professional immediately if you have back pain, numbness or tingling (especially in your legs and feet), muscle weakness, paralysis, or loss of bladder or bowel control. °Avoid sports and activities that might cause injury while you are using this medicine. Severe falls or injuries can cause unseen bleeding. Be careful when using sharp tools or knives. Consider using an electric razor. Take special care brushing or flossing your teeth. Report any injuries, bruising, or red spots on the skin to your doctor or health care professional. °What side effects may I notice from receiving this medicine? °Side effects that you should report to your doctor or health care professional as soon as possible: °-allergic reactions like skin rash, itching or hives, swelling of the face, lips, or tongue °-back pain °-redness, blistering, peeling or loosening of the skin, including inside the mouth °-signs and symptoms of bleeding such as bloody or black, tarry stools; red or dark-brown urine; spitting up blood or brown material that looks like coffee grounds; red spots on the skin; unusual bruising or bleeding from the eye, gums, or nose °Side effects that usually do not require medical attention (report to your doctor or health care professional if they continue or are bothersome): °-dizziness °-muscle pain °Where should I keep my medicine? °Keep out of the reach of  children. °Store at room temperature between 15 and 30 degrees C (59 and 86 degrees F). Throw away any unused medicine after the expiration date. °© 2017 [employer]/Gold Standard (2015-11-05 11:19:11) ° °

## 2016-10-06 ENCOUNTER — Non-Acute Institutional Stay (SKILLED_NURSING_FACILITY): Payer: Medicare Other | Admitting: Adult Health

## 2016-10-06 ENCOUNTER — Encounter: Payer: Self-pay | Admitting: Adult Health

## 2016-10-06 DIAGNOSIS — J449 Chronic obstructive pulmonary disease, unspecified: Secondary | ICD-10-CM

## 2016-10-06 DIAGNOSIS — I2699 Other pulmonary embolism without acute cor pulmonale: Secondary | ICD-10-CM | POA: Diagnosis not present

## 2016-10-06 DIAGNOSIS — I5032 Chronic diastolic (congestive) heart failure: Secondary | ICD-10-CM

## 2016-10-06 DIAGNOSIS — I10 Essential (primary) hypertension: Secondary | ICD-10-CM | POA: Diagnosis not present

## 2016-10-06 DIAGNOSIS — K219 Gastro-esophageal reflux disease without esophagitis: Secondary | ICD-10-CM

## 2016-10-06 DIAGNOSIS — E785 Hyperlipidemia, unspecified: Secondary | ICD-10-CM | POA: Diagnosis not present

## 2016-10-06 DIAGNOSIS — D638 Anemia in other chronic diseases classified elsewhere: Secondary | ICD-10-CM

## 2016-10-06 DIAGNOSIS — R531 Weakness: Secondary | ICD-10-CM

## 2016-10-06 DIAGNOSIS — I82402 Acute embolism and thrombosis of unspecified deep veins of left lower extremity: Secondary | ICD-10-CM | POA: Diagnosis not present

## 2016-10-06 DIAGNOSIS — J9621 Acute and chronic respiratory failure with hypoxia: Secondary | ICD-10-CM | POA: Diagnosis not present

## 2016-10-06 DIAGNOSIS — M810 Age-related osteoporosis without current pathological fracture: Secondary | ICD-10-CM | POA: Diagnosis not present

## 2016-10-06 DIAGNOSIS — K5901 Slow transit constipation: Secondary | ICD-10-CM

## 2016-10-06 DIAGNOSIS — I739 Peripheral vascular disease, unspecified: Secondary | ICD-10-CM

## 2016-10-06 NOTE — Progress Notes (Signed)
DATE:  10/06/2016    MRN:  [id number]  BIRTHDAY: 07/31/1928  Facility:  Nursing Home Location:  [facility]  Nursing Home Room Number: 1201-P  LEVEL OF CARE:  SNF (31)  Contact Information    Name Relation Home Work Mobile   Ezernack,[name] Plandome) Grandaughter   [phone]   Berna Bue [facility] Contact) Daughter [phone]  [phone]   Parthenia Ames [phone]         Code Status History    Date Active Date Inactive Code Status Order ID Comments User Context   10/03/2016  7:04 PM 10/05/2016  7:50 PM DNR [id number]  Briscoe Deutscher, MD ED   09/29/2016  6:22 PM 10/03/2016  3:15 PM DNR [id number]  Narda Bonds, MD Inpatient   09/01/2016  8:12 PM 09/06/2016  5:52 PM DNR [id number]  Briscoe Deutscher, MD ED   08/12/2015  7:07 PM 08/15/2015  5:20 PM Full Code [id number]  Jeralyn Bennett, MD Inpatient    Questions for Most Recent Historical Code Status (Order [id number])    Question Answer Comment   In the event of cardiac or respiratory ARREST Do not call a "code blue"    In the event of cardiac or respiratory ARREST Do not perform Intubation, CPR, defibrillation or ACLS    In the event of cardiac or respiratory ARREST Use medication by any route, position, wound care, and other measures to relive pain and suffering. May use oxygen, suction and manual treatment of airway obstruction as needed for comfort.        Chief Complaint  Patient presents with  . Hospitalization Follow-up    HISTORY OF PRESENT ILLNESS:  This is an 82 year old female seen for a hospital followup.  She was readmitted to [facility] on 10/05/16 after an admission at [facility] 10/03/16-10/05/16 for an acute pulmonary embolism. CTA PE study revealed a small acute pulmonary embolism in the left upper lobe pulmonary artery with out evidence for right heart strain. She was started on heparin infusion then changed to Xarelto.  She was seen in the room today and  daughter from [place] came to visit.  Previous to being hospitalized, she was having a short-term rehabilitation at [facility] with admission on11/21/17 from [facility] hospitalization 09/01/16 through 09/05/16. She has PMH of COPD, GERD, hypertension and peripheral arterial disease. She had a fall at home and started having pain on her left leg. She was found to have no acute fracture or dislocation of her hips on x-ray. A CT of the pelvis and upper thigh of the left side was performed which suggested a hematoma in the distal iliopsoas muscle without tendon rupture. Chest x-ray demonstrated no evidence of focal infiltrate or edema. She was treated for acute COPD exacerbation. She was also treated for acute diastolic heart failure with IV Lasix and developed mild AKI due to being over diuresed. She was given IVF. She was transferred back to the hospital due to acute respiratory failure with hypoxia.    PAST MEDICAL HISTORY:  Past Medical History:  Diagnosis Date  . Carpal tunnel syndrome   . COPD (chronic obstructive pulmonary disease) (HCC)   . GERD (gastroesophageal reflux disease)   . Hyperlipidemia   . Hypertension   . Osteoarthritis      CURRENT MEDICATIONS: Reviewed  Patient's Medications  New Prescriptions   No medications on file  Previous Medications   ALENDRONATE (FOSAMAX) 70 MG TABLET    Take 70 mg by  mouth every Thursday. Take with a full glass of water on an empty stomach.    AMLODIPINE (NORVASC) 10 MG TABLET    Take 1 tablet (10 mg total) by mouth daily.   ASPIRIN EC 81 MG TABLET    Take 81 mg by mouth daily.   ATORVASTATIN (LIPITOR) 20 MG TABLET    Take 20 mg by mouth daily at 6 PM.   BUDESONIDE-FORMOTEROL (SYMBICORT) 160-4.5 MCG/ACT INHALER    Inhale 2 puffs into the lungs 2 (two) times daily.   CILOSTAZOL (PLETAL) 100 MG TABLET    Take 100 mg by mouth 2 (two) times daily.   IPRATROPIUM-ALBUTEROL (DUONEB) 0.5-2.5 (3) MG/3ML SOLN    Take 3 mLs by  nebulization every 4 (four) hours as needed (for wheezing/shortness of breath).   MENTHOL (ICY HOT) 5 % PTCH    Apply 1 application topically daily. Pt applies to left upper arm every morning and removes at bedtime.   MOMETASONE-FORMOTEROL (DULERA) 200-5 MCG/ACT AERO    Inhale 2 puffs into the lungs 2 (two) times daily.   NUTRITIONAL SUPPLEMENTS (NUTRITIONAL SUPPLEMENT PLUS) LIQD    Take 120 mLs by mouth 2 (two) times daily between meals. Med Pass   OMEPRAZOLE (PRILOSEC) 20 MG CAPSULE    Take 1 capsule (20 mg total) by mouth daily.   POLYETHYLENE GLYCOL (MIRALAX / GLYCOLAX) PACKET    Take 17 g by mouth 2 (two) times daily.   PREDNISONE (DELTASONE) 20 MG TABLET    Take 2 tablets (40 mg total) by mouth daily with breakfast.   PROTEIN (PROCEL) POWD    Take 2 scoop by mouth 2 (two) times daily.   RIVAROXABAN (XARELTO) 15 MG TABS TABLET    Take 1 tablet (15 mg total) by mouth 2 (two) times daily with a meal.   SENNA-DOCUSATE (SENOKOT-S) 8.6-50 MG TABLET    Take 2 tablets by mouth 2 (two) times daily.  Modified Medications   No medications on file  Discontinued Medications   SIMVASTATIN (ZOCOR) 40 MG TABLET    Take 40 mg by mouth daily at 6 PM.     Allergies  Allergen Reactions  . Tylenol [Acetaminophen] Other (See Comments)    Reaction:  Shaking      REVIEW OF SYSTEMS:  GENERAL: no change in appetite, no fatigue, no weight changes, no fever, chills or weakness EYES: Denies change in vision, dry eyes, eye pain, itching or discharge EARS: Hard of hearing NOSE: Denies nasal congestion or epistaxis MOUTH and THROAT: Denies oral discomfort, gingival pain or bleeding, pain from teeth or hoarseness   RESPIRATORY: no cough, SOB, DOE, wheezing, hemoptysis CARDIAC: no chest pain, edema or palpitations GI: no abdominal pain, diarrhea, constipation, heart burn, nausea or vomiting GU: Denies dysuria, frequency, hematuria, incontinence, or discharge PSYCHIATRIC: Denies feeling of depression or  anxiety. No report of hallucinations, insomnia, paranoia, or agitation    PHYSICAL EXAMINATION  GENERAL APPEARANCE: Well nourished. In no acute distress. Normal body habitus SKIN:  Skin is warm and dry.  HEAD: Normal in size and contour. No evidence of trauma EYES: Lids open and close normally. No blepharitis, entropion or ectropion. PERRL. Conjunctivae are clear and sclerae are white. Lenses are without opacity EARS: Pinnae are normal. Hard of hearing MOUTH and THROAT: Lips are without lesions. Oral mucosa is moist and without lesions. Tongue is normal in shape, size, and color and without lesions NECK: supple, trachea midline, no neck masses, no thyroid tenderness, no thyromegaly LYMPHATICS: no LAN in the  neck, no supraclavicular LAN RESPIRATORY: has O2 @ 2 L/min via [place], BS CTAB CARDIAC: RRR, no murmur,no extra heart sounds, no edema GI: abdomen soft, normal BS, no masses, no tenderness, no hepatomegaly, no splenomegaly EXTREMITIES:  Able to move 4 extremities PSYCHIATRIC: Alert and oriented X 3. Affect and behavior are appropriate  LABS/RADIOLOGY: Labs reviewed: Basic Metabolic Panel:  Recent Labs  08/12/24 0411 10/03/16 1621 10/04/16 0333  NA 132* 137 138  K 4.0 3.9 3.6  CL 103 101 104  CO2 26 28 29   GLUCOSE 137* 183* 108*  BUN 19 34* 28*  CREATININE 0.79 1.23* 1.00  CALCIUM 8.0* 8.2* 8.0*   Liver Function Tests:  Recent Labs  09/16/16 09/29/16 1419  AST 15 27  ALT 14 14  ALKPHOS 77 88  BILITOT  --  0.8  PROT  --  7.6  ALBUMIN  --  3.1*   CBC:  Recent Labs  09/16/16 09/27/16  09/29/16 1419 09/30/16 0411 10/03/16 1621 10/04/16 0333 10/05/16 0545  WBC 9.7 5.1  < > 5.6 3.5* 8.9  --  9.5  NEUTROABS 7 4  --  4.7  --   --   --   --   HGB 9.0* 8.8*  < > 9.6* 8.7* 8.6* 8.1* 8.3*  HCT 28* 28*  < > 28.7* 26.6* 26.6* 24.8* 25.7*  MCV  --   --   --  102.5* 104.3* 101.9*  --  105.3*  PLT 342 265  < > 259 247 368  --  338  < > = values in this interval not  displayed.  Cardiac Enzymes:  Recent Labs  09/01/16 1940  CKTOTAL 139    CBG:  Recent Labs  09/06/16 0735 10/04/16 0748 10/05/16 0826  GLUCAP 98 79 125*     Dg Chest 2 View  Result Date: 09/29/2016 CLINICAL DATA: Chest pain EXAM: CHEST  2 VIEW COMPARISON:  September 01, 2016 FINDINGS: There is slight scarring in the right base. There is no edema or consolidation. Heart size and pulmonary vascularity are normal. No adenopathy. There is atherosclerotic calcification in the aorta. Aorta is tortuous but stable. There is arthropathy in each shoulder. IMPRESSION: No edema or consolidation. Slight scarring right base. Stable cardiac silhouette. There aorta is tortuous with aortic atherosclerosis. Electronically Signed   By: Bretta Bang III M.D.   On: 09/29/2016 13:30   Ct Angio Chest Pe W And/or Wo Contrast  Result Date: 10/03/2016 CLINICAL DATA:  Increased difficulty breathing.  Short of breath. EXAM: CT ANGIOGRAPHY CHEST WITH CONTRAST TECHNIQUE: Multidetector CT imaging of the chest was performed using the standard protocol during bolus administration of intravenous contrast. Multiplanar CT image reconstructions and MIPs were obtained to evaluate the vascular anatomy. CONTRAST:  75 mL Isovue COMPARISON:  Radiograph 10/02/2016 FINDINGS: Cardiovascular: A thin tubular filling defect within the LEFT upper lobe pulmonary artery (image 93, series 7). No additional pulmonary emboli. Overall clot burden is minimal. No RIGHT ventricular strain present. No pericardial fluid. No acute findings aorta great vessels. Mediastinum/Nodes: No axillary or supraclavicular lymphadenopathy. No mediastinal hilar adenopathy. No pericardial fluid. Lungs/Pleura: Centrilobular emphysema in the upper lobes. No pulmonary infarction. No airspace disease. No pleural fluid or pneumothorax Within the LEFT lower lobe 5 mm pulmonary nodule (image 32, series 8). Upper Abdomen: Limited view of the liver, kidneys, pancreas  are unremarkable. Normal adrenal glands. Musculoskeletal: No aggressive osseous lesion. Review of the MIP images confirms the above findings. IMPRESSION: 1. Small acute pulmonary embolism within the  LEFT upper lobe pulmonary artery. Overall clot burden is minimal. No RIGHT ventricular strain evident. 2. Coronary artery calcification and aortic atherosclerotic calcification. 3. **An incidental finding of potential clinical significance has been found. ** Small LEFT lower lobe pulmonary nodule. No follow-up needed if patient is low-risk. Non-contrast chest CT can be considered in 12 months if patient is high-risk. This recommendation follows the consensus statement: Guidelines for Management of Incidental Pulmonary Nodules Detected on CT Images: From the Fleischner Society 2017; Radiology 2017; 284:228-243. Critical Value/emergent results were called by telephone at the time of interpretation on 10/03/2016 at 5:47 pm to Dr. Linwood DibblesJON KNAPP , who verbally acknowledged these results. Electronically Signed   By: Genevive BiStewart  Edmunds M.D.   On: 10/03/2016 17:49   Ct Abdomen Pelvis W Contrast  Result Date: 09/30/2016 CLINICAL DATA:  History of iliopsoas hematoma, recent falls with left-sided pain, initial encounter EXAM: CT ABDOMEN AND PELVIS WITH CONTRAST TECHNIQUE: Multidetector CT imaging of the abdomen and pelvis was performed using the standard protocol following bolus administration of intravenous contrast. CONTRAST:  75 mL Isovue-300 COMPARISON:  09/01/2016. FINDINGS: Lower chest: Bilateral lower lobe atelectasis. No sizable effusion is seen. Small hiatal hernia is noted. Hepatobiliary: No focal liver abnormality is seen. No gallstones, gallbladder wall thickening, or biliary dilatation. Pancreas: Unremarkable. No pancreatic ductal dilatation or surrounding inflammatory changes. Spleen: Multiple calcified granulomas are noted. The spleen is otherwise within normal limits. Adrenals/Urinary Tract: Renal vascular  calcifications are noted. No calculi are seen. No obstructive changes are noted. The adrenal glands show mild fullness on the left likely related to hyperplasia. Stomach/Bowel: Diffuse diverticular change of the colon is noted. The appendix is within normal limits. No inflammatory changes are noted. Vascular/Lymphatic: Diffuse aortic calcifications are noted. There are changes consistent within infrarenal aortic aneurysm. It measures 6.8 x 6.4 cm in greatest AP and transverse dimensions respectively. Significant mural thrombus is identified no extravasation is identified. The aneurysm extends to the aortic bifurcation. Diffuse calcifications are noted throughout the iliac vessels without aneurysmal dilatation. Reproductive: Uterus and bilateral adnexa are unremarkable. Other: Left inguinal hernia is noted with multiple loops of small bowel within. No obstructive changes are seen. This is stable from the prior exam. Musculoskeletal: Degenerative changes of lumbar spine are seen. IMPRESSION: Mild bibasilar atelectatic changes without pleural effusion. Abdominal aortic aneurysm as described. Vascular surgery consultation recommended due to increased risk of rupture for AAA >5.5 cm. This recommendation follows ACR consensus guidelines: White Paper of the ACR Incidental Findings Committee II on Vascular Findings. J Am Coll Radiol 2013; 10:789-794. Diverticulosis without diverticulitis. Left inguinal hernia containing small bowel loops without incarceration. Electronically Signed   By: Alcide CleverMark  Lukens M.D.   On: 09/30/2016 15:56   Dg Chest Port 1 View  Result Date: 10/02/2016 CLINICAL DATA:  Shortness of breath. EXAM: PORTABLE CHEST 1 VIEW COMPARISON:  09/29/2016 FINDINGS: Lungs are hyperinflated. There is mild perihilar peribronchial thickening. No focal consolidations or pleural effusions are identified. Chronic changes are identified in the left shoulder. IMPRESSION: Hyperinflation and bronchitic changes. No focal  acute pulmonary abnormality. Electronically Signed   By: Norva PavlovElizabeth  Brown M.D.   On: 10/02/2016 16:23    ASSESSMENT/PLAN:  Generalized weakness - for rehabilitation, PT and OT, for therapeutic strengthening exercises; fall precautions  Acute on chronic respiratory failure with her function - continue O2 @ 2L/min via [place]; continue Symbicort 160-4.5 mcg inhale 2 puffs into the lungs twice a day, prednisone 20 mg 2 tabs = 40 mg PO Q D and  DuoNeb 3 mL via nebulization every 4 hours when necessary  Acute pulmonary embolism  and LLE DVT - she was started on heparin been then changed to Xarelto 15 mg twice a day 21 days (10/26/2016) then Xarelto 20 mg daily  Osteoporosis - continue Fosamax 70 mg 1 tab by mouth every week  Hypertension - continue amlodipine besylate 10 mg 1 tab by mouth daily  GERD - continue Prilosec 20 mg 1 tab by mouth daily  Hyperlipidemia - continue Lipitor 20 mg 1 tab by mouth every 6 p.m.  PVD - continue Pletal 100 mg 1 tab by mouth twice a day and aspirin 81 mg 1 tab by mouth daily  Chronic diastolic CHF - EF 60%; stable  Anemia of chronic disease  - check CBC in 1 week Lab Results  Component Value Date   HGB 8.3 (L) 10/05/2016   AKI -  GFR 50 ; check BMP in 1 week Lab Results  Component Value Date   CREATININE 1.00 10/04/2016   Constipation - continue MiraLAX 17 g by mouth twice a day and senna S 2 tabs by mouth twice a day  COPD -  continue O2 @ 2L/min via [place]; continue Symbicort 160-4.5 mcg inhale 2 puffs into the lungs twice a day, prednisone 20 mg 2 tabs = 40 mg PO Q D and DuoNeb 3 mL via nebulization every 4 hours when necessary      Goals of care:  Short-term rehabilitation    Carrie Pena - NP [facility] [phone]

## 2016-10-07 ENCOUNTER — Other Ambulatory Visit: Payer: Self-pay | Admitting: *Deleted

## 2016-10-07 ENCOUNTER — Encounter: Payer: Self-pay | Admitting: Internal Medicine

## 2016-10-07 ENCOUNTER — Non-Acute Institutional Stay (SKILLED_NURSING_FACILITY): Payer: Medicare Other | Admitting: Internal Medicine

## 2016-10-07 DIAGNOSIS — J9621 Acute and chronic respiratory failure with hypoxia: Secondary | ICD-10-CM | POA: Diagnosis not present

## 2016-10-07 DIAGNOSIS — K219 Gastro-esophageal reflux disease without esophagitis: Secondary | ICD-10-CM

## 2016-10-07 DIAGNOSIS — I10 Essential (primary) hypertension: Secondary | ICD-10-CM

## 2016-10-07 DIAGNOSIS — K5900 Constipation, unspecified: Secondary | ICD-10-CM

## 2016-10-07 DIAGNOSIS — I5032 Chronic diastolic (congestive) heart failure: Secondary | ICD-10-CM | POA: Diagnosis not present

## 2016-10-07 DIAGNOSIS — I2699 Other pulmonary embolism without acute cor pulmonale: Secondary | ICD-10-CM

## 2016-10-07 DIAGNOSIS — I739 Peripheral vascular disease, unspecified: Secondary | ICD-10-CM

## 2016-10-07 DIAGNOSIS — R5381 Other malaise: Secondary | ICD-10-CM | POA: Diagnosis not present

## 2016-10-07 DIAGNOSIS — J441 Chronic obstructive pulmonary disease with (acute) exacerbation: Secondary | ICD-10-CM

## 2016-10-07 DIAGNOSIS — E440 Moderate protein-calorie malnutrition: Secondary | ICD-10-CM

## 2016-10-07 DIAGNOSIS — R29898 Other symptoms and signs involving the musculoskeletal system: Secondary | ICD-10-CM

## 2016-10-07 DIAGNOSIS — M810 Age-related osteoporosis without current pathological fracture: Secondary | ICD-10-CM

## 2016-10-07 NOTE — Progress Notes (Signed)
LOCATION: [facility]  PCP: Thayer Headings, MD   Code Status: DNR  Goals of care: Advanced Directive information Advanced Directives 10/06/2016  Does Patient Have a Medical Advance Directive? Yes  Type of Advance Directive -  Does patient want to make changes to medical advance directive? No - Patient declined  Copy of Healthcare Power of Attorney in Chart? No - copy requested  Would patient like information on creating a medical advance directive? -  Pre-existing out of facility DNR order (yellow form or pink MOST form) -  Some encounter information is confidential and restricted. Go to Review Flowsheets activity to see all data.       Extended Emergency Contact Information Primary Emergency Contact: Haywood Filler ([place])  D[place] Amber of [place] Phone: [phone] Relation: Grandaughter Secondary Emergency Contact: Berna Bue [facility]) Address: [address]          [place], [place] [address] Darden Amber of [place] Home Phone: [phone] Mobile Phone: [phone] Relation: Daughter   Allergies  Allergen Reactions  . Tylenol [Acetaminophen] Other (See Comments)    Reaction:  Shaking     Chief Complaint  Patient presents with  . Readmit To SNF    Readmission Visit      HPI:  Patient is a 82 y.o. female seen today for short term rehabilitation post hospital [facility] with acute on chronic respiratory failure with acute pulmonary embolism and COPD exacerbation. She was started on heparin and later switched to Xarelto. She required prednisone and nebulizer treatment. She had acute renal failure and required IV fluids. Of note she was in the hospital just prior to this hospital admission for COPD exacerbation and CHF exacerbation.She has medical history of COPD, chronic respiratory failure, chronic diastolic congestive heart failure, peripheral artery disease and hypertension among others. She is seen in her room  today.  Review of Systems:  Constitutional: Negative for fever, chills, diaphoresis.  HENT: Negative for headache, congestion, nasal discharge Eyes: Negative for blurred vision, double vision and discharge. Wears glasses. Respiratory: Positive for wheezing, shortness of breath with exertion, cough with clear phlegm   Cardiovascular: Negative for chest pain, palpitations, leg swelling.  Gastrointestinal: Negative for heartburn, nausea, vomiting, abdominal pain. Last bowel movement was yesterday.  Genitourinary: Negative for dysuria and flank pain.  Musculoskeletal: Negative for back pain, fall in the facility.  Skin: Negative for itching, rash.  Neurological: Negative for dizziness. Psychiatric/Behavioral: Negative for depression   Past Medical History:  Diagnosis Date  . Carpal tunnel syndrome   . COPD (chronic obstructive pulmonary disease) (HCC)   . GERD (gastroesophageal reflux disease)   . Hyperlipidemia   . Hypertension   . Osteoarthritis    Past Surgical History:  Procedure Laterality Date  . KNEE ARTHROSCOPY    . left carpal tunnel    . TONSILLECTOMY     Social History:   reports that she quit smoking about 18 months ago. Her smoking use included Cigarettes. She smoked 0.50 packs per day. She has never used smokeless tobacco. She reports that she does not drink alcohol or use drugs.  Family History  Problem Relation Age of Onset  . Cancer Mother   . Cancer Father   . Hypertension Daughter   . Heart disease Daughter     before age 19    Medications: Allergies as of 10/07/2016      Reactions   Tylenol [acetaminophen] Other (See Comments)   Reaction:  Shaking       Medication List  Accurate as of 10/07/16  3:03 PM. Always use your most recent med list.          alendronate 70 MG tablet Commonly known as:  FOSAMAX Take 70 mg by mouth every Thursday. Take with a full glass of water on an empty stomach.   ALPRAZolam 0.25 MG tablet Commonly known  as:  XANAX Take 0.25 mg by mouth every 8 (eight) hours as needed for anxiety.   amLODipine 10 MG tablet Commonly known as:  NORVASC Take 1 tablet (10 mg total) by mouth daily.   aspirin EC 81 MG tablet Take 81 mg by mouth daily.   atorvastatin 20 MG tablet Commonly known as:  LIPITOR Take 20 mg by mouth daily at 6 PM.   budesonide-formoterol 160-4.5 MCG/ACT inhaler Commonly known as:  SYMBICORT Inhale 2 puffs into the lungs 2 (two) times daily.   cilostazol 100 MG tablet Commonly known as:  PLETAL Take 100 mg by mouth 2 (two) times daily.   ICY HOT 5 % Ptch Generic drug:  Menthol Apply 1 application topically daily. Pt applies to left upper arm every morning and removes at bedtime.   ipratropium-albuterol 0.5-2.5 (3) MG/3ML Soln Commonly known as:  DUONEB Take 3 mLs by nebulization every 4 (four) hours as needed (for wheezing/shortness of breath).   NUTRITIONAL SUPPLEMENT PLUS Liqd Take 120 mLs by mouth 2 (two) times daily between meals. Med Pass   omeprazole 20 MG capsule Commonly known as:  PRILOSEC Take 1 capsule (20 mg total) by mouth daily.   oxycodone 5 MG capsule Commonly known as:  OXY-IR Take 5 mg by mouth every 6 (six) hours as needed for pain.   polyethylene glycol packet Commonly known as:  MIRALAX / GLYCOLAX Take 17 g by mouth 2 (two) times daily.   predniSONE 20 MG tablet Commonly known as:  DELTASONE Take 2 tablets (40 mg total) by mouth daily with breakfast.   PROCEL Powd Take 2 scoop by mouth 2 (two) times daily.   Rivaroxaban 15 MG Tabs tablet Commonly known as:  XARELTO Take 1 tablet (15 mg total) by mouth 2 (two) times daily with a meal.   senna-docusate 8.6-50 MG tablet Commonly known as:  Senokot-S Take 2 tablets by mouth 2 (two) times daily.       Immunizations:  There is no immunization history on file for this patient.   Physical Exam:  Vitals:   10/07/16 1458  BP: (!) 124/59  Pulse: 80  Resp: 20  Temp: 99 F (37.2  C)  TempSrc: Oral  SpO2: 93%  Weight: 146 lb (66.2 kg)  Height: 5\' 2"  (1.575 m)   Body mass index is 26.7 kg/m.  General- elderly female, well built, in no acute distress Head- normocephalic, atraumatic Nose- no maxillary or frontal sinus tenderness, no nasal discharge Throat- moist mucus membrane Eyes- PERRLA, EOMI, no pallor, no icterus, no discharge, normal conjunctiva, normal sclera Neck- no cervical lymphadenopathy Cardiovascular- normal s1,s2, no murmur Respiratory- decreased movement on both sides, wheezing present, no rhonchi, no crackles, no use of accessory muscles, on 4 L oxygen by nasal cannula Abdomen- bowel sounds present, soft, non tender Musculoskeletal- able to move all 4 extremities, generalized weakness, trace leg edema, limited range of motion to her left shoulder, weakness most prominent in her legs Neurological- alert and oriented to person, place and time Skin- warm and dry Psychiatry- normal mood and affect    Labs reviewed: Basic Metabolic Panel:  Recent Labs  81/19/14 0411 10/03/16  1621 10/04/16 0333  NA 132* 137 138  K 4.0 3.9 3.6  CL 103 101 104  CO2 26 28 29   GLUCOSE 137* 183* 108*  BUN 19 34* 28*  CREATININE 0.79 1.23* 1.00  CALCIUM 8.0* 8.2* 8.0*   Liver Function Tests:  Recent Labs  09/16/16 09/29/16 1419  AST 15 27  ALT 14 14  ALKPHOS 77 88  BILITOT  --  0.8  PROT  --  7.6  ALBUMIN  --  3.1*   No results for input(s): LIPASE, AMYLASE in the last 8760 hours. No results for input(s): AMMONIA in the last 8760 hours. CBC:  Recent Labs  09/16/16 09/27/16  09/29/16 1419 09/30/16 0411 10/03/16 1621 10/04/16 0333 10/05/16 0545  WBC 9.7 5.1  < > 5.6 3.5* 8.9  --  9.5  NEUTROABS 7 4  --  4.7  --   --   --   --   HGB 9.0* 8.8*  < > 9.6* 8.7* 8.6* 8.1* 8.3*  HCT 28* 28*  < > 28.7* 26.6* 26.6* 24.8* 25.7*  MCV  --   --   --  102.5* 104.3* 101.9*  --  105.3*  PLT 342 265  < > 259 247 368  --  338  < > = values in this interval  not displayed. Cardiac Enzymes:  Recent Labs  09/01/16 1940  CKTOTAL 139   BNP: Invalid input(s): POCBNP CBG:  Recent Labs  09/06/16 0735 10/04/16 0748 10/05/16 0826  GLUCAP 98 79 125*    Radiological Exams: Dg Chest 2 View  Result Date: 09/29/2016 CLINICAL DATA: Chest pain EXAM: CHEST  2 VIEW COMPARISON:  September 01, 2016 FINDINGS: There is slight scarring in the right base. There is no edema or consolidation. Heart size and pulmonary vascularity are normal. No adenopathy. There is atherosclerotic calcification in the aorta. Aorta is tortuous but stable. There is arthropathy in each shoulder. IMPRESSION: No edema or consolidation. Slight scarring right base. Stable cardiac silhouette. There aorta is tortuous with aortic atherosclerosis. Electronically Signed   By: Bretta Bang III M.D.   On: 09/29/2016 13:30   Ct Angio Chest Pe W And/or Wo Contrast  Result Date: 10/03/2016 CLINICAL DATA:  Increased difficulty breathing.  Short of breath. EXAM: CT ANGIOGRAPHY CHEST WITH CONTRAST TECHNIQUE: Multidetector CT imaging of the chest was performed using the standard protocol during bolus administration of intravenous contrast. Multiplanar CT image reconstructions and MIPs were obtained to evaluate the vascular anatomy. CONTRAST:  75 mL Isovue COMPARISON:  Radiograph 10/02/2016 FINDINGS: Cardiovascular: A thin tubular filling defect within the LEFT upper lobe pulmonary artery (image 93, series 7). No additional pulmonary emboli. Overall clot burden is minimal. No RIGHT ventricular strain present. No pericardial fluid. No acute findings aorta great vessels. Mediastinum/Nodes: No axillary or supraclavicular lymphadenopathy. No mediastinal hilar adenopathy. No pericardial fluid. Lungs/Pleura: Centrilobular emphysema in the upper lobes. No pulmonary infarction. No airspace disease. No pleural fluid or pneumothorax Within the LEFT lower lobe 5 mm pulmonary nodule (image 32, series 8). Upper  Abdomen: Limited view of the liver, kidneys, pancreas are unremarkable. Normal adrenal glands. Musculoskeletal: No aggressive osseous lesion. Review of the MIP images confirms the above findings. IMPRESSION: 1. Small acute pulmonary embolism within the LEFT upper lobe pulmonary artery. Overall clot burden is minimal. No RIGHT ventricular strain evident. 2. Coronary artery calcification and aortic atherosclerotic calcification. 3. **An incidental finding of potential clinical significance has been found. ** Small LEFT lower lobe pulmonary nodule. No follow-up needed  if patient is low-risk. Non-contrast chest CT can be considered in 12 months if patient is high-risk. This recommendation follows the consensus statement: Guidelines for Management of Incidental Pulmonary Nodules Detected on CT Images: From the Fleischner Society 2017; Radiology 2017; 284:228-243. Critical Value/emergent results were called by telephone at the time of interpretation on 10/03/2016 at 5:47 pm to Dr. Linwood DibblesJON KNAPP , who verbally acknowledged these results. Electronically Signed   By: Genevive BiStewart  Edmunds M.D.   On: 10/03/2016 17:49   Ct Abdomen Pelvis W Contrast  Result Date: 09/30/2016 CLINICAL DATA:  History of iliopsoas hematoma, recent falls with left-sided pain, initial encounter EXAM: CT ABDOMEN AND PELVIS WITH CONTRAST TECHNIQUE: Multidetector CT imaging of the abdomen and pelvis was performed using the standard protocol following bolus administration of intravenous contrast. CONTRAST:  75 mL Isovue-300 COMPARISON:  09/01/2016. FINDINGS: Lower chest: Bilateral lower lobe atelectasis. No sizable effusion is seen. Small hiatal hernia is noted. Hepatobiliary: No focal liver abnormality is seen. No gallstones, gallbladder wall thickening, or biliary dilatation. Pancreas: Unremarkable. No pancreatic ductal dilatation or surrounding inflammatory changes. Spleen: Multiple calcified granulomas are noted. The spleen is otherwise within normal  limits. Adrenals/Urinary Tract: Renal vascular calcifications are noted. No calculi are seen. No obstructive changes are noted. The adrenal glands show mild fullness on the left likely related to hyperplasia. Stomach/Bowel: Diffuse diverticular change of the colon is noted. The appendix is within normal limits. No inflammatory changes are noted. Vascular/Lymphatic: Diffuse aortic calcifications are noted. There are changes consistent within infrarenal aortic aneurysm. It measures 6.8 x 6.4 cm in greatest AP and transverse dimensions respectively. Significant mural thrombus is identified no extravasation is identified. The aneurysm extends to the aortic bifurcation. Diffuse calcifications are noted throughout the iliac vessels without aneurysmal dilatation. Reproductive: Uterus and bilateral adnexa are unremarkable. Other: Left inguinal hernia is noted with multiple loops of small bowel within. No obstructive changes are seen. This is stable from the prior exam. Musculoskeletal: Degenerative changes of lumbar spine are seen. IMPRESSION: Mild bibasilar atelectatic changes without pleural effusion. Abdominal aortic aneurysm as described. Vascular surgery consultation recommended due to increased risk of rupture for AAA >5.5 cm. This recommendation follows ACR consensus guidelines: White Paper of the ACR Incidental Findings Committee II on Vascular Findings. J Am Coll Radiol 2013; 10:789-794. Diverticulosis without diverticulitis. Left inguinal hernia containing small bowel loops without incarceration. Electronically Signed   By: Alcide CleverMark  Lukens M.D.   On: 09/30/2016 15:56   Dg Chest Port 1 View  Result Date: 10/02/2016 CLINICAL DATA:  Shortness of breath. EXAM: PORTABLE CHEST 1 VIEW COMPARISON:  09/29/2016 FINDINGS: Lungs are hyperinflated. There is mild perihilar peribronchial thickening. No focal consolidations or pleural effusions are identified. Chronic changes are identified in the left shoulder. IMPRESSION:  Hyperinflation and bronchitic changes. No focal acute pulmonary abnormality. Electronically Signed   By: Norva PavlovElizabeth  Brown M.D.   On: 10/02/2016 16:23    Assessment/Plan  Physical deconditioning From generalized weakness. Will need for her to work with physical therapy and occupational therapy team to help regain her strength and her balance.  Lower extremity weakness With unsteady gait. Will have her be evaluated by PT and OT team to help with her gait and balance. Fall precautions to be taken.  Acute pulmonary embolism Patient has some dyspnea present. Continue xarelto 15 mg twice a day until 10/26/2016 and then will have her on xarelto 20 mg daily. Continue oxygen by nasal cannula for now.  Acute on chronic respiratory failure With COPD exacerbation,  recent CHF exacerbation and acute pulmonary embolism. Currently on oxygen 4 L by nasal cannula. Per patient, was not using oxygen at home. Will wean off oxygen slowly as tolerated. Continue oxygen for now but patient having symptomatic dyspnea with minimal exertion. Will have her on DuoNeb 4 times a day for next 5 days and then on a need basis. Continue Xanax 0.25 mg every 8 hours as needed for anxiety.  COPD exacerbation Currently on Symbicort twice a day. Continue and complete prednisone 40 mg daily on 10/08/2016. Continue oxygen by nasal cannula for now. Will add DuoNeb to help with her wheezing. Plan per discharge summary is to have home on oxygen 2 L by nasal cannula continuously.  Constipation Currently on MiraLAX twice a day and senna S2 tabs twice a day. Hydration to be encouraged.  Hypertension Monitor blood pressure reading. Continue amlodipine 10 mg daily.  Chronic diastolic congestive heart failure Stable at present. Echocardiogram showed EF of 60%. Off all medications at present. Monitor her weight 3 days a week. Lasix was discontinued in the hospital with impaired renal function. Check BMP.  Gastroesophageal reflux  disease Symptoms are controlled. Continue her Prilosec.   Peripheral vascular disease Continue cilostazol 100 mg twice a day. Continue baby aspirin on daily basis.  Protein calorie malnutrition Continue her protein supplement supplement. Registered dietitian to follow.  Osteoporosis Fall precautions to be taken. Continue Fosamax once a week.    Goals of care: short term rehabilitation   Labs/tests ordered: cbc, bmp  Family/ staff Communication: reviewed care plan with patient and nursing supervisor    Oneal GroutMAHIMA Savino Whisenant, MD Internal Medicine [facility] [address] [place], [place] [address] Cell Phone (Monday-Friday 8 am - 5 pm): [phone] On Call: [phone] and follow prompts after 5 pm and on weekends Office Phone: [phone] Office Fax: [phone]

## 2016-10-07 NOTE — Patient Outreach (Signed)
[facility]) Care Management  10/07/2016  Marsh DollyJeanne R Sanks [birth year] [id number]   CSW was able to meet with patient today [facility] patient currently resides to receive short-term rehabilitative services.  Patient was resting at the time of CSW's arrival, so CSW made the visit brief.  [facility], [facility].  CSW will plan to meet with patient for the next routine visit [facility] on Friday, January 5th.  Patient voiced understanding and was agreeable to this plan. Danford BadJoanna Lakhia Gengler, BSW, MSW, LCSW  Licensed Restaurant manager, fast foodClinical Social Worker  [facility] Care Management [facility]  [address], [place], [place] [address] Physical Address-[address], [place], [place] [address] Toll Free Main # [phone] Fax # [phone] Cell # [phone]  Fax # [phone]  [email or web address]

## 2016-10-10 ENCOUNTER — Emergency Department (HOSPITAL_COMMUNITY)
Admission: EM | Admit: 2016-10-10 | Discharge: 2016-10-11 | Disposition: A | Discharge status: Home / Self Care | Payer: Medicare Other | Attending: Emergency Medicine | Admitting: Emergency Medicine

## 2016-10-10 ENCOUNTER — Encounter (HOSPITAL_COMMUNITY): Payer: Self-pay | Admitting: Emergency Medicine

## 2016-10-10 DIAGNOSIS — Z7982 Long term (current) use of aspirin: Secondary | ICD-10-CM | POA: Insufficient documentation

## 2016-10-10 DIAGNOSIS — I110 Hypertensive heart disease with heart failure: Secondary | ICD-10-CM | POA: Diagnosis not present

## 2016-10-10 DIAGNOSIS — R0602 Shortness of breath: Secondary | ICD-10-CM | POA: Diagnosis not present

## 2016-10-10 DIAGNOSIS — Z87891 Personal history of nicotine dependence: Secondary | ICD-10-CM | POA: Diagnosis not present

## 2016-10-10 DIAGNOSIS — Z86711 Personal history of pulmonary embolism: Secondary | ICD-10-CM | POA: Insufficient documentation

## 2016-10-10 DIAGNOSIS — J441 Chronic obstructive pulmonary disease with (acute) exacerbation: Secondary | ICD-10-CM

## 2016-10-10 DIAGNOSIS — Z79899 Other long term (current) drug therapy: Secondary | ICD-10-CM | POA: Insufficient documentation

## 2016-10-10 DIAGNOSIS — Z7901 Long term (current) use of anticoagulants: Secondary | ICD-10-CM | POA: Insufficient documentation

## 2016-10-10 DIAGNOSIS — I5032 Chronic diastolic (congestive) heart failure: Secondary | ICD-10-CM | POA: Insufficient documentation

## 2016-10-10 DIAGNOSIS — R05 Cough: Secondary | ICD-10-CM | POA: Diagnosis not present

## 2016-10-10 NOTE — ED Notes (Signed)
Patient transported to X-ray 

## 2016-10-10 NOTE — ED Provider Notes (Signed)
[facility]-EMERGENCY DEPT Provider Note   CSN: [id number] Arrival date & time: 10/10/16  2216  By signing my name below, I, Majel HomerPeyton Lee, attest that this documentation has been prepared under the direction and in the presence of Shon Batonourtney F Tahira Olivarez, MD . Electronically Signed: Majel HomerPeyton Lee, Scribe. 10/10/2016. 11:34 PM.  History   Chief Complaint Chief Complaint  Patient presents with  . Shortness of Breath   The history is provided by the patient and the EMS personnel. No language interpreter was used.   HPI Comments: Carrie Pena is a 82 y.o. female with PMHx of COPD, HTN and HLD, brought in by EMS to the Emergency Department [facility] complaining of gradually worsening, shortness of breath that began at ~1:00 PM this afternoon. Per EMS, pt received a nebulizer treatment en route to the ED and received [place] at 4L upon arrival. She states she does not normally use home O2 and notes no improvement with Oxygen. She reports she currently lives by herself. Pt denies fever, cough, chest pain, leg swelling, and any other complaints.   Recent history of PE. On Xarelto.  Past Medical History:  Diagnosis Date  . Carpal tunnel syndrome   . COPD (chronic obstructive pulmonary disease) (HCC)   . GERD (gastroesophageal reflux disease)   . Hyperlipidemia   . Hypertension   . Osteoarthritis     Patient Active Problem List   Diagnosis Date Noted  . AAA (abdominal aortic aneurysm) without rupture (HCC) 10/03/2016  . Anxiety state 10/03/2016  . Acute pulmonary embolism (HCC) 10/03/2016  . Hyperglycemia 10/03/2016  . Anemia   . COPD (chronic obstructive pulmonary disease) (HCC) 09/29/2016  . Chronic diastolic CHF (congestive heart failure) (HCC) 09/06/2016  . AKI (acute kidney injury) (HCC) 09/06/2016  . Iliopsoas muscle hematoma, left, initial encounter 09/01/2016  . Intractable pain 09/01/2016  . Macrocytic anemia 09/01/2016  . Essential hypertension, benign 08/22/2015  . COPD  with acute exacerbation (HCC) 08/12/2015  . Acute on chronic respiratory failure with hypoxia (HCC) 08/12/2015  . CAP (community acquired pneumonia) 08/12/2015  . Pain of right lower extremity 05/06/2014  . Swelling of limb 05/06/2014  . Atherosclerosis of native arteries of the extremities with intermittent claudication 07/18/2011    Past Surgical History:  Procedure Laterality Date  . KNEE ARTHROSCOPY    . left carpal tunnel    . TONSILLECTOMY      OB History    No data available       Home Medications    Prior to Admission medications   Medication Sig Start Date End Date Taking? Authorizing Provider  alendronate (FOSAMAX) 70 MG tablet Take 70 mg by mouth every Thursday. Take with a full glass of water on an empty stomach.    Yes Historical Provider, MD  ALPRAZolam (XANAX) 0.25 MG tablet Take 0.25 mg by mouth every 8 (eight) hours as needed for anxiety.   Yes Historical Provider, MD  amLODipine (NORVASC) 10 MG tablet Take 1 tablet (10 mg total) by mouth daily. 08/15/15  Yes Jeralyn BennettEzequiel Zamora, MD  aspirin EC 81 MG tablet Take 81 mg by mouth daily.   Yes Historical Provider, MD  atorvastatin (LIPITOR) 20 MG tablet Take 20 mg by mouth daily at 6 PM.   Yes Historical Provider, MD  budesonide-formoterol (SYMBICORT) 160-4.5 MCG/ACT inhaler Inhale 2 puffs into the lungs 2 (two) times daily.   Yes Historical Provider, MD  cilostazol (PLETAL) 100 MG tablet Take 100 mg by mouth 2 (two) times  daily.   Yes Historical Provider, MD  ipratropium-albuterol (DUONEB) 0.5-2.5 (3) MG/3ML SOLN Take 3 mLs by nebulization every 4 (four) hours as needed (for wheezing/shortness of breath).   Yes Historical Provider, MD  Menthol (ICY HOT) 5 % PTCH Apply 1 application topically daily. Pt applies to left upper arm every morning and removes at bedtime.   Yes Historical Provider, MD  omeprazole (PRILOSEC) 20 MG capsule Take 1 capsule (20 mg total) by mouth daily. 10/05/16  Yes Alison MurrayAlma M Devine, MD  OVER THE  COUNTER MEDICATION Take 120 mLs by mouth 2 (two) times daily.   Yes Historical Provider, MD  oxycodone (OXY-IR) 5 MG capsule Take 5 mg by mouth every 6 (six) hours as needed for pain.   Yes Historical Provider, MD  OXYGEN Inhale 2 L into the lungs daily as needed (for low O2 sats).   Yes Historical Provider, MD  polyethylene glycol (MIRALAX / GLYCOLAX) packet Take 17 g by mouth 2 (two) times daily.   Yes Historical Provider, MD  Protein (PROCEL) POWD Take 2 scoop by mouth 2 (two) times daily.   Yes Historical Provider, MD  Rivaroxaban (XARELTO) 15 MG TABS tablet Take 1 tablet (15 mg total) by mouth 2 (two) times daily with a meal. 10/05/16 10/26/16 Yes Alison MurrayAlma M Devine, MD  senna-docusate (SENOKOT-S) 8.6-50 MG tablet Take 2 tablets by mouth 2 (two) times daily.   Yes Historical Provider, MD  predniSONE (DELTASONE) 20 MG tablet Take 2 tablets (40 mg total) by mouth daily. 10/11/16   Shon Batonourtney F Evan Osburn, MD    Family History Family History  Problem Relation Age of Onset  . Cancer Mother   . Cancer Father   . Hypertension Daughter   . Heart disease Daughter     before age 80    Social History Social History  Substance Use Topics  . Smoking status: Former Smoker    Packs/day: 0.50    Types: Cigarettes    Quit date: 03/12/2015  . Smokeless tobacco: Never Used  . Alcohol use No     Allergies   Tylenol [acetaminophen]   Review of Systems Review of Systems  Constitutional: Negative for fever.  Respiratory: Positive for shortness of breath. Negative for cough.   Cardiovascular: Negative for chest pain and leg swelling.  Gastrointestinal: Negative for abdominal pain, nausea and vomiting.  All other systems reviewed and are negative.  Physical Exam Updated Vital Signs BP 138/74   Pulse 113   Resp (!) 27   SpO2 96%   Physical Exam  Constitutional: She is oriented to person, place, and time. No distress.  Elderly, chronically ill-appearing, no acute distress  HENT:  Head:  Normocephalic and atraumatic.  Cardiovascular: Normal rate, regular rhythm and normal heart sounds.   Pulmonary/Chest: Effort normal. No respiratory distress.  Nasal cannula in place, scant expiratory wheeze  Abdominal: Soft. Bowel sounds are normal. There is no tenderness.  Musculoskeletal: She exhibits edema.  1+ bilateral lower extremity edema  Neurological: She is alert and oriented to person, place, and time.  Skin: Skin is warm and dry.  Psychiatric: She has a normal mood and affect.  Nursing note and vitals reviewed.    ED Treatments / Results  Labs (all labs ordered are listed, but only abnormal results are displayed) Labs Reviewed  CBC WITH DIFFERENTIAL/PLATELET - Abnormal; Notable for the following:       Result Value   RBC 2.42 (*)    Hemoglobin 8.1 (*)    HCT 26.4 (*)  MCV 109.1 (*)    RDW 17.0 (*)    All other components within normal limits  BASIC METABOLIC PANEL - Abnormal; Notable for the following:    Glucose, Bld 108 (*)    BUN 28 (*)    Creatinine, Ser 1.05 (*)    Calcium 8.6 (*)    GFR calc non Af Amer 47 (*)    GFR calc Af Amer 54 (*)    All other components within normal limits  BRAIN NATRIURETIC PEPTIDE  I-STAT TROPOININ, ED    EKG  EKG Interpretation  Date/Time:  Monday October 10 2016 22:40:54 EST Ventricular Rate:  113 PR Interval:    QRS Duration: 83 QT Interval:  333 QTC Calculation: 457 R Axis:   51 Text Interpretation:  Sinus tachycardia Abnormal R-wave progression, early transition Abnormal T, consider ischemia, lateral leads More tachycardia than prior Confirmed by Lynzy Rawles  MD, Americus Perkey ([id number]) on 10/11/2016 12:19:10 AM       Radiology Dg Chest 2 View  Result Date: 10/11/2016 CLINICAL DATA:  Cough and shortness of breath tonight. Recent diagnosis of pulmonary embolus. EXAM: CHEST  2 VIEW COMPARISON:  Radiograph 03/02/2016, CT 10/03/2016 FINDINGS: Lower lung volumes from prior exam. Mild emphysema. Tortuous thoracic aorta with  atherosclerosis. The heart is normal in size. No pulmonary edema. Minimal left infrahilar atelectasis. No confluent airspace disease, pleural effusion or pneumothorax. Advanced degenerative change of the left shoulder. IMPRESSION: Left lung base atelectasis. Thoracic aortic atherosclerosis. Electronically Signed   By: Rubye Oaks M.D.   On: 10/11/2016 00:08    Procedures Procedures (including critical care time)  Medications Ordered in ED Medications  ipratropium-albuterol (DUONEB) 0.5-2.5 (3) MG/3ML nebulizer solution 3 mL (3 mLs Nebulization Given 10/11/16 0143)  methylPREDNISolone sodium succinate (SOLU-MEDROL) 125 mg/2 mL injection 125 mg (125 mg Intravenous Given 10/11/16 0143)    DIAGNOSTIC STUDIES:  Oxygen Saturation is 96% on RA, normal by my interpretation.    COORDINATION OF CARE:  11:24 PM Discussed treatment plan with pt at bedside and pt agreed to plan.  Initial Impression / Assessment and Plan / ED Course  I have reviewed the triage vital signs and the nursing notes.  Pertinent labs & imaging results that were available during my care of the patient were reviewed by me and considered in my medical decision making (see chart for details).  Clinical Course as of Oct 11 250  Tue Oct 11, 2016  0119 Workup largely reassuring. On recheck, patient is in no acute distress. She is now wheezing. Likely had improved following DuoNeb by EMS. She was given steroids and repeat DuoNeb. Fill etiology for shortness of breath likely secondary to COPD.  [CH]    Clinical Course User Index [CH] Shon Baton, MD    Patient presents with shortness of breath. History of COPD and received a neb en route. Also recent history of pulmonary embolism. On Xarelto. Last dose earlier this evening. She is nontoxic. Mildly tachycardic. Initially breath sounds clear with fair air movement. 2 L nasal cannula in place. EKG, chest x-ray, lab work largely reassuring. Given that she is  hemodynamically stable, do not feel she needs repeat imaging for PE as she is on appropriate treatment. On recheck she did again to wheeze again. She was given a DuoNeb and Solu-Medrol. Suspect COPD. She receives some supplemental oxygen at her living facility. On recheck after DuoNeb, patient states that she feels better and asked "can I go home." We'll treat as a COPD exacerbation. Discharged with  prednisone and DuoNeb's. Oxygen when necessary.  After history, exam, and medical workup I feel the patient has been appropriately medically screened and is safe for discharge home. Pertinent diagnoses were discussed with the patient. Patient was given return precautions.  I personally performed the services described in this documentation, which was scribed in my presence. The recorded information has been reviewed and is accurate.   Final Clinical Impressions(s) / ED Diagnoses   Final diagnoses:  COPD exacerbation (HCC)  SOB (shortness of breath)  History of pulmonary embolism    New Prescriptions New Prescriptions   PREDNISONE (DELTASONE) 20 MG TABLET    Take 2 tablets (40 mg total) by mouth daily.     Shon Baton, MD 10/11/16 [phone]

## 2016-10-10 NOTE — ED Triage Notes (Signed)
Per EMS, pt [facility] with c/o shortness of breath beginning at 1300 today. Pt received a neb treatment, SpO2 88% for EMS, pt on [place] at 4L upon arrival to this ED, SpO2-94%. BP-111/67, HR-112

## 2016-10-11 ENCOUNTER — Emergency Department (HOSPITAL_COMMUNITY): Payer: Medicare Other

## 2016-10-11 LAB — CBC WITH DIFFERENTIAL/PLATELET
Basophils Absolute: 0 10*3/uL (ref 0.0–0.1)
Basophils Relative: 0 %
EOS ABS: 0 10*3/uL (ref 0.0–0.7)
EOS PCT: 0 %
HCT: 26.4 % — ABNORMAL LOW (ref 36.0–46.0)
Hemoglobin: 8.1 g/dL — ABNORMAL LOW (ref 12.0–15.0)
LYMPHS ABS: 2.4 10*3/uL (ref 0.7–4.0)
Lymphocytes Relative: 24 %
MCH: 33.5 pg (ref 26.0–34.0)
MCHC: 30.7 g/dL (ref 30.0–36.0)
MCV: 109.1 fL — ABNORMAL HIGH (ref 78.0–100.0)
MONO ABS: 0.8 10*3/uL (ref 0.1–1.0)
Monocytes Relative: 8 %
Neutro Abs: 6.7 10*3/uL (ref 1.7–7.7)
Neutrophils Relative %: 68 %
PLATELETS: 367 10*3/uL (ref 150–400)
RBC: 2.42 MIL/uL — AB (ref 3.87–5.11)
RDW: 17 % — AB (ref 11.5–15.5)
WBC: 9.9 10*3/uL (ref 4.0–10.5)

## 2016-10-11 LAB — BASIC METABOLIC PANEL
Anion gap: 8 (ref 5–15)
BUN: 28 mg/dL — AB (ref 6–20)
CALCIUM: 8.6 mg/dL — AB (ref 8.9–10.3)
CO2: 23 mmol/L (ref 22–32)
CREATININE: 1.05 mg/dL — AB (ref 0.44–1.00)
Chloride: 108 mmol/L (ref 101–111)
GFR calc Af Amer: 54 mL/min — ABNORMAL LOW (ref >60)
GFR, EST NON AFRICAN AMERICAN: 47 mL/min — AB (ref >60)
GLUCOSE: 108 mg/dL — AB (ref 65–99)
POTASSIUM: 4 mmol/L (ref 3.5–5.1)
SODIUM: 139 mmol/L (ref 135–145)

## 2016-10-11 LAB — BRAIN NATRIURETIC PEPTIDE: B NATRIURETIC PEPTIDE 5: 37.4 pg/mL (ref 0.0–100.0)

## 2016-10-11 LAB — I-STAT TROPONIN, ED: Troponin i, poc: 0 ng/mL (ref 0.00–0.08)

## 2016-10-11 MED ORDER — METHYLPREDNISOLONE SODIUM SUCC 125 MG IJ SOLR
125.0000 mg | Freq: Once | INTRAMUSCULAR | Status: AC
  Administered 2016-10-11: 125 mg via INTRAVENOUS
  Filled 2016-10-11: qty 2

## 2016-10-11 MED ORDER — IPRATROPIUM-ALBUTEROL 0.5-2.5 (3) MG/3ML IN SOLN
3.0000 mL | Freq: Once | RESPIRATORY_TRACT | Status: AC
  Administered 2016-10-11: 3 mL via RESPIRATORY_TRACT
  Filled 2016-10-11: qty 3

## 2016-10-11 MED ORDER — PREDNISONE 20 MG PO TABS: 40.0000 mg | ORAL_TABLET | Freq: Every day | ORAL | 0 refills | Status: DC

## 2016-10-11 NOTE — ED Notes (Signed)
Pts daughter called and due to the patient having dementia, she is unable to talk to her daughter. Below is her contact information so nurse or MD could call and give and update.   Fredrik Covennie E [phone]  Gardiner SleeperMelissa E [phone]

## 2016-10-11 NOTE — ED Notes (Signed)
[facility] sure [facility] was a way to get supplemental oxygen per Dr request.  Marisue IvanLiz RN, which is the pt's nurse advised me that the pt was already getting 2 to 4 L. of O2 there at the facility.

## 2016-10-11 NOTE — ED Notes (Signed)
Pts daughter called and due to the patient having dementia, she is unable to talk to her daughter. Below is her contact information so nurse or MD could call and give and update.   Annie E [phone]  [name] E [phone] 

## 2016-10-18 ENCOUNTER — Encounter (HOSPITAL_COMMUNITY): Payer: Self-pay

## 2016-10-18 ENCOUNTER — Inpatient Hospital Stay (HOSPITAL_COMMUNITY): Payer: Medicare Other

## 2016-10-18 ENCOUNTER — Inpatient Hospital Stay (HOSPITAL_COMMUNITY)
Admission: EM | Admit: 2016-10-18 | Discharge: 2016-10-23 | DRG: 811 | Disposition: A | Discharge status: Skilled Nursing Facility | Payer: Medicare Other | Attending: Internal Medicine | Admitting: Internal Medicine

## 2016-10-18 DIAGNOSIS — D62 Acute posthemorrhagic anemia: Principal | ICD-10-CM | POA: Diagnosis present

## 2016-10-18 DIAGNOSIS — E861 Hypovolemia: Secondary | ICD-10-CM | POA: Diagnosis present

## 2016-10-18 DIAGNOSIS — D509 Iron deficiency anemia, unspecified: Secondary | ICD-10-CM | POA: Diagnosis not present

## 2016-10-18 DIAGNOSIS — I5032 Chronic diastolic (congestive) heart failure: Secondary | ICD-10-CM | POA: Diagnosis present

## 2016-10-18 DIAGNOSIS — D539 Nutritional anemia, unspecified: Secondary | ICD-10-CM | POA: Diagnosis not present

## 2016-10-18 DIAGNOSIS — R040 Epistaxis: Secondary | ICD-10-CM

## 2016-10-18 DIAGNOSIS — K219 Gastro-esophageal reflux disease without esophagitis: Secondary | ICD-10-CM | POA: Diagnosis present

## 2016-10-18 DIAGNOSIS — I739 Peripheral vascular disease, unspecified: Secondary | ICD-10-CM

## 2016-10-18 DIAGNOSIS — E785 Hyperlipidemia, unspecified: Secondary | ICD-10-CM | POA: Diagnosis present

## 2016-10-18 DIAGNOSIS — Z8249 Family history of ischemic heart disease and other diseases of the circulatory system: Secondary | ICD-10-CM | POA: Diagnosis not present

## 2016-10-18 DIAGNOSIS — J9621 Acute and chronic respiratory failure with hypoxia: Secondary | ICD-10-CM | POA: Diagnosis present

## 2016-10-18 DIAGNOSIS — Z66 Do not resuscitate: Secondary | ICD-10-CM | POA: Diagnosis present

## 2016-10-18 DIAGNOSIS — Z9981 Dependence on supplemental oxygen: Secondary | ICD-10-CM | POA: Diagnosis not present

## 2016-10-18 DIAGNOSIS — J441 Chronic obstructive pulmonary disease with (acute) exacerbation: Secondary | ICD-10-CM | POA: Diagnosis present

## 2016-10-18 DIAGNOSIS — Z79891 Long term (current) use of opiate analgesic: Secondary | ICD-10-CM

## 2016-10-18 DIAGNOSIS — Z79899 Other long term (current) drug therapy: Secondary | ICD-10-CM | POA: Diagnosis not present

## 2016-10-18 DIAGNOSIS — Z7982 Long term (current) use of aspirin: Secondary | ICD-10-CM | POA: Diagnosis not present

## 2016-10-18 DIAGNOSIS — R0602 Shortness of breath: Secondary | ICD-10-CM

## 2016-10-18 DIAGNOSIS — I110 Hypertensive heart disease with heart failure: Secondary | ICD-10-CM | POA: Diagnosis present

## 2016-10-18 DIAGNOSIS — D649 Anemia, unspecified: Secondary | ICD-10-CM

## 2016-10-18 DIAGNOSIS — D638 Anemia in other chronic diseases classified elsewhere: Secondary | ICD-10-CM | POA: Diagnosis present

## 2016-10-18 DIAGNOSIS — Z87891 Personal history of nicotine dependence: Secondary | ICD-10-CM | POA: Diagnosis not present

## 2016-10-18 DIAGNOSIS — R0902 Hypoxemia: Secondary | ICD-10-CM | POA: Diagnosis not present

## 2016-10-18 DIAGNOSIS — N289 Disorder of kidney and ureter, unspecified: Secondary | ICD-10-CM | POA: Diagnosis not present

## 2016-10-18 DIAGNOSIS — E8809 Other disorders of plasma-protein metabolism, not elsewhere classified: Secondary | ICD-10-CM | POA: Diagnosis present

## 2016-10-18 DIAGNOSIS — R710 Precipitous drop in hematocrit: Secondary | ICD-10-CM | POA: Diagnosis present

## 2016-10-18 DIAGNOSIS — N179 Acute kidney failure, unspecified: Secondary | ICD-10-CM | POA: Diagnosis present

## 2016-10-18 DIAGNOSIS — Z809 Family history of malignant neoplasm, unspecified: Secondary | ICD-10-CM

## 2016-10-18 DIAGNOSIS — M1990 Unspecified osteoarthritis, unspecified site: Secondary | ICD-10-CM | POA: Diagnosis present

## 2016-10-18 DIAGNOSIS — T502X5A Adverse effect of carbonic-anhydrase inhibitors, benzothiadiazides and other diuretics, initial encounter: Secondary | ICD-10-CM | POA: Diagnosis present

## 2016-10-18 DIAGNOSIS — I714 Abdominal aortic aneurysm, without rupture: Secondary | ICD-10-CM | POA: Diagnosis present

## 2016-10-18 DIAGNOSIS — Z7951 Long term (current) use of inhaled steroids: Secondary | ICD-10-CM

## 2016-10-18 DIAGNOSIS — I2699 Other pulmonary embolism without acute cor pulmonale: Secondary | ICD-10-CM

## 2016-10-18 DIAGNOSIS — R609 Edema, unspecified: Secondary | ICD-10-CM | POA: Diagnosis not present

## 2016-10-18 DIAGNOSIS — D500 Iron deficiency anemia secondary to blood loss (chronic): Secondary | ICD-10-CM | POA: Diagnosis not present

## 2016-10-18 DIAGNOSIS — J9811 Atelectasis: Secondary | ICD-10-CM | POA: Diagnosis present

## 2016-10-18 DIAGNOSIS — Z86711 Personal history of pulmonary embolism: Secondary | ICD-10-CM

## 2016-10-18 DIAGNOSIS — J440 Chronic obstructive pulmonary disease with acute lower respiratory infection: Secondary | ICD-10-CM | POA: Diagnosis present

## 2016-10-18 DIAGNOSIS — Z7901 Long term (current) use of anticoagulants: Secondary | ICD-10-CM

## 2016-10-18 DIAGNOSIS — R195 Other fecal abnormalities: Secondary | ICD-10-CM | POA: Diagnosis not present

## 2016-10-18 DIAGNOSIS — R296 Repeated falls: Secondary | ICD-10-CM | POA: Diagnosis not present

## 2016-10-18 DIAGNOSIS — M6281 Muscle weakness (generalized): Secondary | ICD-10-CM | POA: Diagnosis not present

## 2016-10-18 DIAGNOSIS — R918 Other nonspecific abnormal finding of lung field: Secondary | ICD-10-CM | POA: Diagnosis not present

## 2016-10-18 DIAGNOSIS — R41841 Cognitive communication deficit: Secondary | ICD-10-CM | POA: Diagnosis not present

## 2016-10-18 DIAGNOSIS — K921 Melena: Secondary | ICD-10-CM | POA: Diagnosis not present

## 2016-10-18 LAB — CBC
HCT: 19.3 % — ABNORMAL LOW (ref 36.0–46.0)
Hemoglobin: 6 g/dL — CL (ref 12.0–15.0)
MCH: 33 pg (ref 26.0–34.0)
MCHC: 31.1 g/dL (ref 30.0–36.0)
MCV: 106 fL — ABNORMAL HIGH (ref 78.0–100.0)
PLATELETS: 335 10*3/uL (ref 150–400)
RBC: 1.82 MIL/uL — AB (ref 3.87–5.11)
RDW: 17.4 % — ABNORMAL HIGH (ref 11.5–15.5)
WBC: 11.6 10*3/uL — ABNORMAL HIGH (ref 4.0–10.5)

## 2016-10-18 LAB — COMPREHENSIVE METABOLIC PANEL
ALK PHOS: 55 U/L (ref 38–126)
ALT: 13 U/L — AB (ref 14–54)
AST: 18 U/L (ref 15–41)
Albumin: 2.5 g/dL — ABNORMAL LOW (ref 3.5–5.0)
Anion gap: 7 (ref 5–15)
BILIRUBIN TOTAL: 0.3 mg/dL (ref 0.3–1.2)
BUN: 30 mg/dL — ABNORMAL HIGH (ref 6–20)
CALCIUM: 8.1 mg/dL — AB (ref 8.9–10.3)
CO2: 24 mmol/L (ref 22–32)
CREATININE: 1.25 mg/dL — AB (ref 0.44–1.00)
Chloride: 106 mmol/L (ref 101–111)
GFR, EST AFRICAN AMERICAN: 44 mL/min — AB (ref >60)
GFR, EST NON AFRICAN AMERICAN: 38 mL/min — AB (ref >60)
Glucose, Bld: 117 mg/dL — ABNORMAL HIGH (ref 65–99)
Potassium: 4.4 mmol/L (ref 3.5–5.1)
Sodium: 137 mmol/L (ref 135–145)
Total Protein: 6.1 g/dL — ABNORMAL LOW (ref 6.5–8.1)

## 2016-10-18 LAB — CBC AND DIFFERENTIAL
HCT: 22 % — AB (ref 36–46)
Hemoglobin: 6.5 g/dL — AB (ref 12.0–16.0)
Platelets: 383 10*3/uL (ref 150–399)
WBC: 15.8 10^3/mL

## 2016-10-18 LAB — RETICULOCYTES
RBC.: 1.89 MIL/uL — ABNORMAL LOW (ref 3.87–5.11)
RETIC COUNT ABSOLUTE: 160.7 10*3/uL (ref 19.0–186.0)
Retic Ct Pct: 8.5 % — ABNORMAL HIGH (ref 0.4–3.1)

## 2016-10-18 LAB — IRON AND TIBC
IRON: 22 ug/dL — AB (ref 28–170)
Saturation Ratios: 9 % — ABNORMAL LOW (ref 10.4–31.8)
TIBC: 251 ug/dL (ref 250–450)
UIBC: 229 ug/dL

## 2016-10-18 LAB — BASIC METABOLIC PANEL
BUN: 30 mg/dL — AB (ref 4–21)
CREATININE: 1 mg/dL (ref 0.5–1.1)
GLUCOSE: 90 mg/dL
POTASSIUM: 4 mmol/L (ref 3.4–5.3)
Sodium: 140 mmol/L (ref 137–147)

## 2016-10-18 LAB — FOLATE: FOLATE: 8.9 ng/mL (ref >5.9)

## 2016-10-18 LAB — PREPARE RBC (CROSSMATCH)

## 2016-10-18 LAB — PROTIME-INR
INR: 1.91
Prothrombin Time: 22.2 seconds — ABNORMAL HIGH (ref 11.4–15.2)

## 2016-10-18 LAB — POC OCCULT BLOOD, ED: Fecal Occult Bld: POSITIVE — AB

## 2016-10-18 LAB — ABO/RH: ABO/RH(D): O NEG

## 2016-10-18 LAB — FERRITIN: Ferritin: 77 ng/mL (ref 11–307)

## 2016-10-18 LAB — VITAMIN B12: VITAMIN B 12: 402 pg/mL (ref 180–914)

## 2016-10-18 MED ORDER — RIVAROXABAN 15 MG PO TABS
15.0000 mg | ORAL_TABLET | Freq: Two times a day (BID) | ORAL | Status: DC
  Administered 2016-10-19: 15 mg via ORAL
  Filled 2016-10-18: qty 1

## 2016-10-18 MED ORDER — ALPRAZOLAM 0.25 MG PO TABS
0.2500 mg | ORAL_TABLET | Freq: Three times a day (TID) | ORAL | Status: DC | PRN
  Administered 2016-10-19 – 2016-10-22 (×7): 0.25 mg via ORAL
  Filled 2016-10-18 (×7): qty 1

## 2016-10-18 MED ORDER — IPRATROPIUM-ALBUTEROL 0.5-2.5 (3) MG/3ML IN SOLN: 3.0000 mL | RESPIRATORY_TRACT | Status: DC | PRN

## 2016-10-18 MED ORDER — CILOSTAZOL 100 MG PO TABS
100.0000 mg | ORAL_TABLET | Freq: Two times a day (BID) | ORAL | Status: DC
  Administered 2016-10-18 – 2016-10-23 (×10): 100 mg via ORAL
  Filled 2016-10-18 (×10): qty 1

## 2016-10-18 MED ORDER — PANTOPRAZOLE SODIUM 40 MG PO TBEC
40.0000 mg | DELAYED_RELEASE_TABLET | Freq: Two times a day (BID) | ORAL | Status: DC
  Administered 2016-10-18 – 2016-10-23 (×10): 40 mg via ORAL
  Filled 2016-10-18 (×10): qty 1

## 2016-10-18 MED ORDER — SODIUM CHLORIDE 0.9 % IV SOLN
10.0000 mL/h | Freq: Once | INTRAVENOUS | Status: AC
  Administered 2016-10-18: 10 mL/h via INTRAVENOUS

## 2016-10-18 MED ORDER — ONDANSETRON HCL 4 MG PO TABS: 4.0000 mg | ORAL_TABLET | Freq: Four times a day (QID) | ORAL | Status: DC | PRN

## 2016-10-18 MED ORDER — PREDNISONE 20 MG PO TABS
40.0000 mg | ORAL_TABLET | Freq: Every day | ORAL | Status: DC
  Administered 2016-10-19: 40 mg via ORAL
  Filled 2016-10-18: qty 2

## 2016-10-18 MED ORDER — FUROSEMIDE 10 MG/ML IJ SOLN
20.0000 mg | Freq: Once | INTRAMUSCULAR | Status: AC
  Administered 2016-10-18: 20 mg via INTRAVENOUS
  Filled 2016-10-18: qty 2

## 2016-10-18 MED ORDER — ONDANSETRON HCL 4 MG/2ML IJ SOLN: 4.0000 mg | Freq: Four times a day (QID) | INTRAMUSCULAR | Status: DC | PRN

## 2016-10-18 MED ORDER — ATORVASTATIN CALCIUM 20 MG PO TABS
20.0000 mg | ORAL_TABLET | Freq: Every day | ORAL | Status: DC
  Administered 2016-10-18 – 2016-10-22 (×5): 20 mg via ORAL
  Filled 2016-10-18 (×5): qty 1

## 2016-10-18 NOTE — ED Provider Notes (Signed)
[facility] DEPT Provider Note   CSN: [id number] Arrival date & time: 10/18/16  1735     History   Chief Complaint Chief Complaint  Patient presents with  . Abnormal Lab    pt sent in for eval of low Hgb     HPI Carrie Pena is a 83 y.o. female.  The history is provided by the patient.  Weakness  Primary symptoms comment: generalized weakness and chronic shortness of breath. This is a new problem. The current episode started 12 to 24 hours ago. The problem has not changed since onset.There was no focality noted. There has been no fever. Pertinent negatives include no vomiting and no altered mental status. Associated symptoms comments: Epistaxis 2 days ago. There were no medications administered prior to arrival. Associated medical issues comments: anticoaguated on xarelto for PE.    Past Medical History:  Diagnosis Date  . Carpal tunnel syndrome   . COPD (chronic obstructive pulmonary disease) (HCC)   . GERD (gastroesophageal reflux disease)   . Hyperlipidemia   . Hypertension   . Osteoarthritis     Patient Active Problem List   Diagnosis Date Noted  . AAA (abdominal aortic aneurysm) without rupture (HCC) 10/03/2016  . Anxiety state 10/03/2016  . Acute pulmonary embolism (HCC) 10/03/2016  . Hyperglycemia 10/03/2016  . Anemia   . COPD (chronic obstructive pulmonary disease) (HCC) 09/29/2016  . Chronic diastolic CHF (congestive heart failure) (HCC) 09/06/2016  . AKI (acute kidney injury) (HCC) 09/06/2016  . Iliopsoas muscle hematoma, left, initial encounter 09/01/2016  . Intractable pain 09/01/2016  . Macrocytic anemia 09/01/2016  . Essential hypertension, benign 08/22/2015  . COPD with acute exacerbation (HCC) 08/12/2015  . Acute on chronic respiratory failure with hypoxia (HCC) 08/12/2015  . CAP (community acquired pneumonia) 08/12/2015  . Pain of right lower extremity 05/06/2014  . Swelling of limb 05/06/2014  . Atherosclerosis of native arteries of the  extremities with intermittent claudication 07/18/2011    Past Surgical History:  Procedure Laterality Date  . KNEE ARTHROSCOPY    . left carpal tunnel    . TONSILLECTOMY      OB History    No data available       Home Medications    Prior to Admission medications   Medication Sig Start Date End Date Taking? Authorizing Provider  alendronate (FOSAMAX) 70 MG tablet Take 70 mg by mouth every Thursday. Take with a full glass of water on an empty stomach.    Yes Historical Provider, MD  ALPRAZolam (XANAX) 0.25 MG tablet Take 0.25 mg by mouth every 8 (eight) hours as needed for anxiety.   Yes Historical Provider, MD  amLODipine (NORVASC) 10 MG tablet Take 1 tablet (10 mg total) by mouth daily. 08/15/15  Yes Jeralyn Bennett, MD  aspirin EC 81 MG tablet Take 81 mg by mouth daily.   Yes Historical Provider, MD  atorvastatin (LIPITOR) 20 MG tablet Take 20 mg by mouth daily at 6 PM.   Yes Historical Provider, MD  budesonide-formoterol (SYMBICORT) 160-4.5 MCG/ACT inhaler Inhale 2 puffs into the lungs 2 (two) times daily.   Yes Historical Provider, MD  cilostazol (PLETAL) 100 MG tablet Take 100 mg by mouth 2 (two) times daily.   Yes Historical Provider, MD  ipratropium-albuterol (DUONEB) 0.5-2.5 (3) MG/3ML SOLN Take 3 mLs by nebulization every 4 (four) hours as needed (for wheezing/shortness of breath).   Yes Historical Provider, MD  Menthol (ICY HOT) 5 % PTCH Apply 1 application topically daily. Pt applies  to left upper arm every morning and removes at bedtime.   Yes Historical Provider, MD  omeprazole (PRILOSEC) 20 MG capsule Take 1 capsule (20 mg total) by mouth daily. 10/05/16  Yes Alison MurrayAlma M Devine, MD  OVER THE COUNTER MEDICATION Take 120 mLs by mouth 2 (two) times daily. Med pass   Yes Historical Provider, MD  oxycodone (OXY-IR) 5 MG capsule Take 5 mg by mouth every 6 (six) hours as needed for pain.   Yes Historical Provider, MD  OXYGEN Inhale 2 L into the lungs daily as needed (for low O2  sats).   Yes Historical Provider, MD  polyethylene glycol (MIRALAX / GLYCOLAX) packet Take 17 g by mouth 2 (two) times daily.   Yes Historical Provider, MD  predniSONE (DELTASONE) 20 MG tablet Take 2 tablets (40 mg total) by mouth daily. 10/11/16  Yes Shon Batonourtney F Horton, MD  Protein (PROCEL) POWD Take 2 scoop by mouth 2 (two) times daily.   Yes Historical Provider, MD  Rivaroxaban (XARELTO) 15 MG TABS tablet Take 1 tablet (15 mg total) by mouth 2 (two) times daily with a meal. 10/05/16 10/26/16 Yes Alison MurrayAlma M Devine, MD  senna-docusate (SENOKOT-S) 8.6-50 MG tablet Take 2 tablets by mouth 2 (two) times daily.   Yes Historical Provider, MD    Family History Family History  Problem Relation Age of Onset  . Cancer Mother   . Cancer Father   . Hypertension Daughter   . Heart disease Daughter     before age 81    Social History Social History  Substance Use Topics  . Smoking status: Former Smoker    Packs/day: 0.50    Types: Cigarettes    Quit date: 03/12/2015  . Smokeless tobacco: Never Used  . Alcohol use No     Allergies   Tylenol [acetaminophen]   Review of Systems Review of Systems  Gastrointestinal: Negative for vomiting.  Neurological: Positive for weakness.  All other systems reviewed and are negative.    Physical Exam Updated Vital Signs BP 121/58   Pulse 89   Temp 98 F (36.7 C) (Oral)   Resp (!) 27   Ht 5\' 3"  (1.6 m)   Wt 150 lb (68 kg)   SpO2 (!) 89%   BMI 26.57 kg/m   Physical Exam  Constitutional: She is oriented to person, place, and time. She appears well-developed and well-nourished. She appears ill. No distress.  HENT:  Head: Normocephalic.  Nose: Nose normal.  Eyes: Conjunctivae are normal.  Neck: Neck supple. No tracheal deviation present.  Cardiovascular: Normal rate, regular rhythm and normal heart sounds.   Pulmonary/Chest: Effort normal and breath sounds normal. Tachypnea noted. No respiratory distress.  Abdominal: Soft. She exhibits no  distension. There is no tenderness. There is no guarding.  Neurological: She is alert and oriented to person, place, and time.  Skin: Skin is warm and dry. There is pallor.  Psychiatric: She has a normal mood and affect.     ED Treatments / Results  Labs (all labs ordered are listed, but only abnormal results are displayed) Labs Reviewed  COMPREHENSIVE METABOLIC PANEL - Abnormal; Notable for the following:       Result Value   Glucose, Bld 117 (*)    BUN 30 (*)    Creatinine, Ser 1.25 (*)    Calcium 8.1 (*)    Total Protein 6.1 (*)    Albumin 2.5 (*)    ALT 13 (*)    GFR calc non Af [place]  38 (*)    GFR calc Af Amer 44 (*)    All other components within normal limits  CBC - Abnormal; Notable for the following:    WBC 11.6 (*)    RBC 1.82 (*)    Hemoglobin 6.0 (*)    HCT 19.3 (*)    MCV 106.0 (*)    RDW 17.4 (*)    All other components within normal limits  PROTIME-INR - Abnormal; Notable for the following:    Prothrombin Time 22.2 (*)    All other components within normal limits  IRON AND TIBC - Abnormal; Notable for the following:    Iron 22 (*)    Saturation Ratios 9 (*)    All other components within normal limits  RETICULOCYTES - Abnormal; Notable for the following:    Retic Ct Pct 8.5 (*)    RBC. 1.89 (*)    All other components within normal limits  POC OCCULT BLOOD, ED - Abnormal; Notable for the following:    Fecal Occult Bld POSITIVE (*)    All other components within normal limits  VITAMIN B12  FOLATE  FERRITIN  CBC  BASIC METABOLIC PANEL  TROPONIN I  TROPONIN I  TYPE AND SCREEN  ABO/RH  PREPARE RBC (CROSSMATCH)    EKG  EKG Interpretation None       Radiology Dg Chest Port 1 View  Result Date: 10/18/2016 CLINICAL DATA:  Dyspnea EXAM: PORTABLE CHEST 1 VIEW COMPARISON:  10/10/2016 CXR FINDINGS: The heart size and mediastinal contours are within normal limits. Aortic atherosclerosis is noted. Patchy airspace disease at the right lung base  suspicious for pneumonia. Subsegmental can platelike atelectasis at the left lung base. Osteoarthritis of the glenohumeral joints. IMPRESSION: Patchy new airspace opacity at the right lung base suspicious for pneumonia. Left lower lobe atelectasis. Electronically Signed   By: Tollie Eth M.D.   On: 10/18/2016 23:58    Procedures Procedures (including critical care time)  Medications Ordered in ED Medications  predniSONE (DELTASONE) tablet 40 mg (not administered)  ALPRAZolam (XANAX) tablet 0.25 mg (not administered)  pantoprazole (PROTONIX) EC tablet 40 mg (40 mg Oral Given 10/18/16 2339)  ipratropium-albuterol (DUONEB) 0.5-2.5 (3) MG/3ML nebulizer solution 3 mL (not administered)  cilostazol (PLETAL) tablet 100 mg (100 mg Oral Given 10/18/16 2339)  atorvastatin (LIPITOR) tablet 20 mg (20 mg Oral Given 10/18/16 2339)  ondansetron (ZOFRAN) tablet 4 mg (not administered)    Or  ondansetron (ZOFRAN) injection 4 mg (not administered)  Rivaroxaban (XARELTO) tablet 15 mg (not administered)  0.9 %  sodium chloride infusion (10 mL/hr Intravenous New Bag/Given 10/18/16 2129)  furosemide (LASIX) injection 20 mg (20 mg Intravenous Given 10/18/16 2339)     Initial Impression / Assessment and Plan / ED Course  I have reviewed the triage vital signs and the nursing notes.  Pertinent labs & imaging results that were available during my care of the patient were reviewed by me and considered in my medical decision making (see chart for details).  Clinical Course     83 y.o. female presents with anemia noted from nursing facility. On xarelto for PE. States she had nosebleed 2 days ago and no other known bleeding events. No hx of GI bleeding. Hb was 6.5 earlier today and 6.0 here. Occult blood positive but unclear if this is d/t swallowing epistaxis bleeding or other source. PRBCs ordered for transfusion and will admit for monitoring. Hospitalist was consulted for admission and will see the patient in the  emergency  department.   Final Clinical Impressions(s) / ED Diagnoses   Final diagnoses:  Symptomatic anemia  Epistaxis  SOB (shortness of breath)    New Prescriptions New Prescriptions   No medications on file     Lyndal Pulley, MD 10/19/16 [phone]

## 2016-10-18 NOTE — ED Notes (Signed)
Critical value 6.0 hgb called by lab.

## 2016-10-18 NOTE — ED Notes (Signed)
Admit MD at bedside

## 2016-10-18 NOTE — ED Notes (Signed)
2 units of blood ready per Johnny BridgeMartha in Blood Bank.

## 2016-10-18 NOTE — ED Triage Notes (Signed)
Pt has no complaints though was sent in by facility for low Hgb pt unaware if she has dark stools pt is O2 dependent on 5L from facility

## 2016-10-18 NOTE — H&P (Signed)
History and Physical    Carrie DollyJeanne R Dahle JYN:[id number] DOB: [birth year] DOA: 10/18/2016  Referring MD/NP/PA: Dr. Clydene PughKnott PCP: [name],[name], MD  [facility]  Chief Complaint: Abnormal labs  HPI: Carrie Pena is a 83 y.o. female with medical history significant of HTN, HLD, COPD, chronic respiratory failure ,chronic diastolic CHF last echo 09/2016 showing EF of 60-65% and grade 1 dfx), and recently diagnosed PE on 09/2016 on anticoagulation of Xarelto; who presents after being found to have abnormal lab work. Patient reports that approximately 2-3 days ago she had a nosebleed. She reports that lasted for a couple hours and was very heavy with bright red blood. Symptoms self resolved after patient was noted to hold pressure for some time. Per review of records patient's hemoglobin prior to discharge from the hospital was 8.1. The facility checked her hemoglobin and it was noted to be 6.5 today. She is transferred from there here. Patient denies any blood per rectum to her knowledge. Associated symptoms include shortness of breath and pain with deep inspiration on the right side that was new, but self resolved. Chronically reports lower extremity swelling.    ED Course: Upon admission to the emergency department patient was seen to be afebrile, heart rate 78-91, respirations up to 27, blood pressure maintained, O2 saturations as low as 88% on room air. Lab work revealed WBC 11.6, hemoglobin 6, BUN 30, creatinine 1.25. Patient was found to have positive guaiac stools  Review of Systems: As per HPI otherwise 10 point review of systems negative.   Past Medical History:  Diagnosis Date  . Carpal tunnel syndrome   . COPD (chronic obstructive pulmonary disease) (HCC)   . GERD (gastroesophageal reflux disease)   . Hyperlipidemia   . Hypertension   . Osteoarthritis     Past Surgical History:  Procedure Laterality Date  . KNEE ARTHROSCOPY    . left carpal tunnel    .  TONSILLECTOMY       reports that she quit smoking about 19 months ago. Her smoking use included Cigarettes. She smoked 0.50 packs per day. She has never used smokeless tobacco. She reports that she does not drink alcohol or use drugs.  Allergies  Allergen Reactions  . Tylenol [Acetaminophen] Other (See Comments)    Reaction:  Shaking     Family History  Problem Relation Age of Onset  . Cancer Mother   . Cancer Father   . Hypertension Daughter   . Heart disease Daughter     before age 81    Prior to Admission medications   Medication Sig Start Date End Date Taking? Authorizing Provider  alendronate (FOSAMAX) 70 MG tablet Take 70 mg by mouth every Thursday. Take with a full glass of water on an empty stomach.    Yes Historical Provider, MD  ALPRAZolam (XANAX) 0.25 MG tablet Take 0.25 mg by mouth every 8 (eight) hours as needed for anxiety.   Yes Historical Provider, MD  amLODipine (NORVASC) 10 MG tablet Take 1 tablet (10 mg total) by mouth daily. 08/15/15  Yes Jeralyn BennettEzequiel Zamora, MD  aspirin EC 81 MG tablet Take 81 mg by mouth daily.   Yes Historical Provider, MD  atorvastatin (LIPITOR) 20 MG tablet Take 20 mg by mouth daily at 6 PM.   Yes Historical Provider, MD  budesonide-formoterol (SYMBICORT) 160-4.5 MCG/ACT inhaler Inhale 2 puffs into the lungs 2 (two) times daily.   Yes Historical Provider, MD  cilostazol (PLETAL) 100 MG tablet Take 100  mg by mouth 2 (two) times daily.   Yes Historical Provider, MD  ipratropium-albuterol (DUONEB) 0.5-2.5 (3) MG/3ML SOLN Take 3 mLs by nebulization every 4 (four) hours as needed (for wheezing/shortness of breath).   Yes Historical Provider, MD  Menthol (ICY HOT) 5 % PTCH Apply 1 application topically daily. Pt applies to left upper arm every morning and removes at bedtime.   Yes Historical Provider, MD  omeprazole (PRILOSEC) 20 MG capsule Take 1 capsule (20 mg total) by mouth daily. 10/05/16  Yes Alison Murray, MD  OVER THE COUNTER MEDICATION Take 120  mLs by mouth 2 (two) times daily. Med pass   Yes Historical Provider, MD  oxycodone (OXY-IR) 5 MG capsule Take 5 mg by mouth every 6 (six) hours as needed for pain.   Yes Historical Provider, MD  OXYGEN Inhale 2 L into the lungs daily as needed (for low O2 sats).   Yes Historical Provider, MD  polyethylene glycol (MIRALAX / GLYCOLAX) packet Take 17 g by mouth 2 (two) times daily.   Yes Historical Provider, MD  predniSONE (DELTASONE) 20 MG tablet Take 2 tablets (40 mg total) by mouth daily. 10/11/16  Yes Shon Baton, MD  Protein (PROCEL) POWD Take 2 scoop by mouth 2 (two) times daily.   Yes Historical Provider, MD  Rivaroxaban (XARELTO) 15 MG TABS tablet Take 1 tablet (15 mg total) by mouth 2 (two) times daily with a meal. 10/05/16 10/26/16 Yes Alison Murray, MD  senna-docusate (SENOKOT-S) 8.6-50 MG tablet Take 2 tablets by mouth 2 (two) times daily.   Yes Historical Provider, MD    Physical Exam:    Constitutional: Chronically ill-appearing elderly female Vitals:   10/18/16 2130 10/18/16 2145 10/18/16 2200 10/18/16 2231  BP: 108/58 105/55 (!) 115/54 121/56  Pulse: 81 87 81 81  Resp: 17 17 18 20   Temp: 98.3 F (36.8 C) 98.4 F (36.9 C)    TempSrc: Oral Oral    SpO2: 97% 95% 96% 93%  Weight:      Height:       Eyes: PERRL, lids and conjunctivae normal ENMT: Mucous membranes are moist. Posterior pharynx clear of any exudate or lesions.Normal dentition.  Neck: normal, supple, no masses, no thyromegaly Respiratory: Decreased aeration with positive crackles appreciated  bilateral lung fields. Patient being shortened sentences. Cardiovascular: Regular rate and rhythm, no murmurs / rubs / gallops.  2+ pitting lower extremity edema. 2+ pedal pulses. No carotid bruits.  Abdomen: no tenderness, no masses palpated. No hepatosplenomegaly. Bowel sounds positive.  Musculoskeletal: no clubbing / cyanosis. No joint deformity upper and lower extremities. Good ROM, no contractures. Normal muscle  tone.  Skin: no rashes, lesions, ulcers. No induration Neurologic: CN 2-12 grossly intact. Sensation intact, DTR normal. Strength 5/5 in all 4.  Psychiatric: Normal judgment and insight. Alert and oriented x 3. Normal mood.     Labs on Admission: I have personally reviewed following labs and imaging studies  CBC:  Recent Labs Lab 10/18/16 1959  WBC 11.6*  HGB 6.0*  HCT 19.3*  MCV 106.0*  PLT 335   Basic Metabolic Panel:  Recent Labs Lab 10/18/16 1959  NA 137  K 4.4  CL 106  CO2 24  GLUCOSE 117*  BUN 30*  CREATININE 1.25*  CALCIUM 8.1*   GFR: Estimated Creatinine Clearance: 29.9 mL/min (by C-G formula based on SCr of 1.25 mg/dL (H)). Liver Function Tests:  Recent Labs Lab 10/18/16 1959  AST 18  ALT 13*  ALKPHOS 55  BILITOT 0.3  PROT 6.1*  ALBUMIN 2.5*   No results for input(s): LIPASE, AMYLASE in the last 168 hours. No results for input(s): AMMONIA in the last 168 hours. Coagulation Profile:  Recent Labs Lab 10/18/16 2120  INR 1.91   Cardiac Enzymes: No results for input(s): CKTOTAL, CKMB, CKMBINDEX, TROPONINI in the last 168 hours. BNP (last 3 results) No results for input(s): PROBNP in the last 8760 hours. HbA1C: No results for input(s): HGBA1C in the last 72 hours. CBG: No results for input(s): GLUCAP in the last 168 hours. Lipid Profile: No results for input(s): CHOL, HDL, LDLCALC, TRIG, CHOLHDL, LDLDIRECT in the last 72 hours. Thyroid Function Tests: No results for input(s): TSH, T4TOTAL, FREET4, T3FREE, THYROIDAB in the last 72 hours. Anemia Panel:  Recent Labs  10/18/16 2120  VITAMINB12 402  FOLATE 8.9  FERRITIN 77  TIBC 251  IRON 22*  RETICCTPCT 8.5*   Urine analysis:    Component Value Date/Time   COLORURINE YELLOW 09/29/2016 1240   APPEARANCEUR HAZY (A) 09/29/2016 1240   LABSPEC 1.010 09/29/2016 1240   PHURINE 5.0 09/29/2016 1240   GLUCOSEU NEGATIVE 09/29/2016 1240   HGBUR SMALL (A) 09/29/2016 1240   BILIRUBINUR  NEGATIVE 09/29/2016 1240   KETONESUR 5 (A) 09/29/2016 1240   PROTEINUR NEGATIVE 09/29/2016 1240   UROBILINOGEN 0.2 11/08/2009 1426   NITRITE NEGATIVE 09/29/2016 1240   LEUKOCYTESUR NEGATIVE 09/29/2016 1240   Sepsis Labs: No results found for this or any previous visit (from the past 240 hour(s)).   Radiological Exams on Admission: No results found.    Assessment/Plan Symptomatic anemia: Acute. Patient hemoglobin drop from previous admission from a 8.1 on 12/18 down to 6.0 on presentation today. Question possibility of recent nosebleed as cause for symptoms versus GI bleed(although question possibility of positive guaiac stool from pevious nosebleed). - Admit to telemetry bed - f/u anemia pane - Continue transfusion of 2 units of PRBC - Clear liquid diet for now - Serial monitoring of H&H - If H&H continue to drop may need to consult GI for further evaluation and to stop anticoagulation at that point and consult vascular surgery.  Chronic respiratory failure/COPD - Continuous pulse oximetry with nasal cannula oxygen and keep O2 saturation greater than 92% - DuoNeb's prn SOB/Wheezing  - Checking chest x-ray  Pulmonary Embolus on anticoagulation: recently diagnosed in 09/2069 - continue Xarelto for  Now  Chest pain: Patient reports some right-sided chest pain that is now resolved. -  trending troponins - check ekg  Diastolic congestive heart failure: - Strict intake and output    Renal insufficiency: Suspect secondary to hypovolemia from patient's anemia. - Recheck BMP in a.m.  PAD - continue cilostazol  HLD - Continue atorvastatin  Genella Rife - Pharmacy change to Protonix  DVT prophylaxis: SCDs   Code Status: DNR Family Communication: No family present at bedside Disposition Plan:  Likely discharge back to nursing facility once medically stable Consults called: none Admission status: Inpatient  Clydie Braun MD [facility] Pager [phone]  If  7PM-7AM, please contact night-coverage [email or web address] Password [facility]  10/18/2016, 10:47 PM

## 2016-10-18 NOTE — ED Notes (Signed)
Spoke with lab about specimens that were sent earlier with Type and Screen, lab unable to find specimens.  Redraw sent off ER MD made aware

## 2016-10-18 NOTE — ED Notes (Signed)
Nurse will draw labs. 

## 2016-10-19 ENCOUNTER — Inpatient Hospital Stay (HOSPITAL_COMMUNITY): Payer: Medicare Other

## 2016-10-19 ENCOUNTER — Encounter (HOSPITAL_COMMUNITY): Payer: Self-pay

## 2016-10-19 DIAGNOSIS — R609 Edema, unspecified: Secondary | ICD-10-CM

## 2016-10-19 DIAGNOSIS — I739 Peripheral vascular disease, unspecified: Secondary | ICD-10-CM | POA: Diagnosis present

## 2016-10-19 DIAGNOSIS — N289 Disorder of kidney and ureter, unspecified: Secondary | ICD-10-CM

## 2016-10-19 LAB — HEMOGLOBIN AND HEMATOCRIT, BLOOD
HEMATOCRIT: 27.2 % — AB (ref 36.0–46.0)
HEMOGLOBIN: 8.7 g/dL — AB (ref 12.0–15.0)

## 2016-10-19 LAB — CBC
HEMATOCRIT: 23.7 % — AB (ref 36.0–46.0)
HEMOGLOBIN: 7.7 g/dL — AB (ref 12.0–15.0)
MCH: 30.6 pg (ref 26.0–34.0)
MCHC: 32.5 g/dL (ref 30.0–36.0)
MCV: 94 fL (ref 78.0–100.0)
Platelets: 275 10*3/uL (ref 150–400)
RBC: 2.52 MIL/uL — ABNORMAL LOW (ref 3.87–5.11)
RDW: 23.1 % — AB (ref 11.5–15.5)
WBC: 11.2 10*3/uL — ABNORMAL HIGH (ref 4.0–10.5)

## 2016-10-19 LAB — BASIC METABOLIC PANEL
Anion gap: 5 (ref 5–15)
BUN: 26 mg/dL — AB (ref 6–20)
CHLORIDE: 105 mmol/L (ref 101–111)
CO2: 27 mmol/L (ref 22–32)
Calcium: 7.9 mg/dL — ABNORMAL LOW (ref 8.9–10.3)
Creatinine, Ser: 1.05 mg/dL — ABNORMAL HIGH (ref 0.44–1.00)
GFR calc non Af Amer: 47 mL/min — ABNORMAL LOW (ref >60)
GFR, EST AFRICAN AMERICAN: 54 mL/min — AB (ref >60)
GLUCOSE: 87 mg/dL (ref 65–99)
Potassium: 4 mmol/L (ref 3.5–5.1)
Sodium: 137 mmol/L (ref 135–145)

## 2016-10-19 LAB — TROPONIN I: Troponin I: <0.03 ng/mL (ref <0.03)

## 2016-10-19 MED ORDER — IPRATROPIUM-ALBUTEROL 0.5-2.5 (3) MG/3ML IN SOLN
3.0000 mL | Freq: Three times a day (TID) | RESPIRATORY_TRACT | Status: DC
  Administered 2016-10-19: 3 mL via RESPIRATORY_TRACT
  Filled 2016-10-19: qty 3

## 2016-10-19 MED ORDER — ALBUTEROL SULFATE (2.5 MG/3ML) 0.083% IN NEBU
2.5000 mg | INHALATION_SOLUTION | RESPIRATORY_TRACT | Status: DC | PRN
  Administered 2016-10-20: 2.5 mg via RESPIRATORY_TRACT
  Filled 2016-10-19: qty 3

## 2016-10-19 MED ORDER — PREDNISONE 20 MG PO TABS: 30.0000 mg | ORAL_TABLET | Freq: Every day | ORAL | Status: DC

## 2016-10-19 MED ORDER — MORPHINE SULFATE (PF) 2 MG/ML IV SOLN: 2.0000 mg | Freq: Once | INTRAVENOUS | Status: DC

## 2016-10-19 MED ORDER — IPRATROPIUM-ALBUTEROL 0.5-2.5 (3) MG/3ML IN SOLN: 3.0000 mL | Freq: Three times a day (TID) | RESPIRATORY_TRACT | Status: DC

## 2016-10-19 MED ORDER — FUROSEMIDE 10 MG/ML IJ SOLN
40.0000 mg | Freq: Every day | INTRAMUSCULAR | Status: DC
  Administered 2016-10-19 – 2016-10-20 (×2): 40 mg via INTRAVENOUS
  Filled 2016-10-19 (×2): qty 4

## 2016-10-19 MED ORDER — POTASSIUM CHLORIDE CRYS ER 20 MEQ PO TBCR
20.0000 meq | EXTENDED_RELEASE_TABLET | Freq: Every day | ORAL | Status: DC
  Administered 2016-10-19 – 2016-10-23 (×5): 20 meq via ORAL
  Filled 2016-10-19 (×5): qty 1

## 2016-10-19 MED ORDER — SODIUM CHLORIDE 0.9 % IV SOLN: INTRAVENOUS | Status: DC

## 2016-10-19 NOTE — Consult Note (Signed)
   [facility]   10/19/2016  Marsh DollyJeanne R Loor November 13, 1927 [id number]    Ms. [name] [facility]. She has been followed by the [facility] CS[facility]. Please see chart review then notes for further patient outreach details. Will make inpatient team aware that Ms. [name] [facility].   Raiford NobleAtika Jaamal Farooqui, MSN-Ed, RN,BSN [facility] Hospital Liaison [phone]

## 2016-10-19 NOTE — Progress Notes (Signed)
PROGRESS NOTE        PATIENT DETAILS Name: Carrie Pena Age: 83 y.o. Sex: female Date of Birth: [birth year] Admit Date: 10/18/2016 Admitting Physician Clydie Braun, MD ZOX:[id number],WJXBJ, MD  Brief Narrative: Patient is a 83 y.o. female with history of COPD, recent diagnosis of a  pulmonary embolism (10/03/16) on Xarelto admitted for evaluation of acute on chronic anemia. See below for further details  Subjective: Lying comfortably in bed-once to go home. Denies ongoing hematochezia/melena or epistaxis. Had one episode of epistaxis last week that resolved spontaneously. Claims she is not aware of any melena/hematochezia over the past 1 week.  Assessment/Plan: Acute on chronic anemia: Suspect worsening anemia likely secondary to blood loss-suspect occult GI bleeding-she did have epistaxis MG one last week-not sure if this can account for this significant drop in hemoglobin. No further epistaxis, GI consulted for endoscopic evaluation as FOBT positive.Hold Xarelto-PE very small-Lower ext doppler negative. Transfuse 2 units of PRBC on admission, hemoglobin currently stable at 8.7 today.  AKI: Mild prerenal azotemia-improving  Recent diagnosis of pulmonary embolism: Very small pulmonary embolism on CT angiogram done on 12/18-lower extremity Dopplers negative for DVT-given advanced age, severity of anemia-possible acute blood loss-poor long-term candidate for anticoagulation. Given that PE is very small-with no symptoms/hemodynamic compromise-suspect we could discontinue anticoagulation.  COPD: No evidence of exacerbation-continue bronchodilators, taper down prednisone  Acute on chronic hypoxemic respiratory failure: Taper off oxygen-suspect due to COPD and small PE.  Lower extremity edema: Appears chronic-likely secondary to hypoalbuminemia, amlodipine use, although has grade 1 diastolic dysfunction on recent echocardiogram-but does not have any other signs of  CHF. Start IV Lasix and attempted diuresis much as possible.  Chronic diastolic heart failure: Although has no other signs of CHF exacerbation. Starting Lasix. Follow.  History of peripheral vascular disease: Resume aspirin after endoscopic evaluation is complete and continue cilostazol.  Dyslipidemia: Continue statin  GERD: Continue PPI  Hypertension: Blood pressure currently controlled-avoid amlodipine given significant lower extremity edema-will start alternative antihypertensive agent when able  DVT Prophylaxis: SCD's  Code Status: DNR  Family Communication: Grand Daughter over the phone  Disposition Plan: Remain inpatient-back to SNF on discharge-likely in 1-2 days  Antimicrobial agents: Anti-infectives    None      Procedures: None  CONSULTS:  GI  Time spent: 25- minutes-Greater than 50% of this time was spent in counseling, explanation of diagnosis, planning of further management, and coordination of care.  MEDICATIONS: Scheduled Meds: . atorvastatin  20 mg Oral q1800  . cilostazol  100 mg Oral BID  .  morphine injection  2 mg Intravenous Once  . pantoprazole  40 mg Oral BID  . predniSONE  40 mg Oral QAC breakfast   Continuous Infusions: PRN Meds:.ALPRAZolam, ipratropium-albuterol, ondansetron **OR** ondansetron (ZOFRAN) IV   PHYSICAL EXAM: Vital signs: Vitals:   10/19/16 0215 10/19/16 0503 10/19/16 1006 10/19/16 1339  BP: 124/63 (!) 112/55 124/61   Pulse: 77 79 85   Resp: 18 18 18    Temp: 97.8 F (36.6 C) 97.8 F (36.6 C) 97.8 F (36.6 C)   TempSrc: Oral Oral Oral   SpO2: 95% 95% 97% 97%  Weight:      Height:       Filed Weights   10/18/16 1736 10/18/16 2320  Weight: 68 kg (150 lb) 70.1 kg (154 lb 9.6 oz)   Body mass  index is 27.39 kg/m.   General appearance :Awake, alert, not in any distress.  Eyes:, pupils equally reactive to light and accomodation, HEENT: Atraumatic and Normocephalic Neck: supple, no JVD. No cervical  lymphadenopathy.y Resp:Good air entry bilaterally, no added sounds  CVS: S1 S2 regular GI: Bowel sounds present, Non tender and not distended with no gaurding, rigidity or rebound.No organomegaly Extremities: B/L Lower Ext shows ++ edema, both legs are warm to touch Neurology:  speech clear,Non focal, sensation is grossly intact. Psychiatric: Normal judgment and insight. Alert and oriented x 3. Normal mood. Musculoskeletal:No digital cyanosis Skin:No Rash, warm and dry Wounds:N/A  I have personally reviewed following labs and imaging studies  LABORATORY DATA: CBC:  Recent Labs Lab 10/18/16 1959 10/19/16 0455 10/19/16 1044  WBC 11.6* 11.2*  --   HGB 6.0* 7.7* 8.7*  HCT 19.3* 23.7* 27.2*  MCV 106.0* 94.0  --   PLT 335 275  --     Basic Metabolic Panel:  Recent Labs Lab 10/18/16 1959 10/19/16 0455  NA 137 137  K 4.4 4.0  CL 106 105  CO2 24 27  GLUCOSE 117* 87  BUN 30* 26*  CREATININE 1.25* 1.05*  CALCIUM 8.1* 7.9*    GFR: Estimated Creatinine Clearance: 36.1 mL/min (by C-G formula based on SCr of 1.05 mg/dL (H)).  Liver Function Tests:  Recent Labs Lab 10/18/16 1959  AST 18  ALT 13*  ALKPHOS 55  BILITOT 0.3  PROT 6.1*  ALBUMIN 2.5*   No results for input(s): LIPASE, AMYLASE in the last 168 hours. No results for input(s): AMMONIA in the last 168 hours.  Coagulation Profile:  Recent Labs Lab 10/18/16 2120  INR 1.91    Cardiac Enzymes:  Recent Labs Lab 10/19/16 0455 10/19/16 1044  TROPONINI <0.03 <0.03    BNP (last 3 results) No results for input(s): PROBNP in the last 8760 hours.  HbA1C: No results for input(s): HGBA1C in the last 72 hours.  CBG: No results for input(s): GLUCAP in the last 168 hours.  Lipid Profile: No results for input(s): CHOL, HDL, LDLCALC, TRIG, CHOLHDL, LDLDIRECT in the last 72 hours.  Thyroid Function Tests: No results for input(s): TSH, T4TOTAL, FREET4, T3FREE, THYROIDAB in the last 72 hours.  Anemia  Panel:  Recent Labs  10/18/16 2120  VITAMINB12 402  FOLATE 8.9  FERRITIN 77  TIBC 251  IRON 22*  RETICCTPCT 8.5*    Urine analysis:    Component Value Date/Time   COLORURINE YELLOW 09/29/2016 1240   APPEARANCEUR HAZY (A) 09/29/2016 1240   LABSPEC 1.010 09/29/2016 1240   PHURINE 5.0 09/29/2016 1240   GLUCOSEU NEGATIVE 09/29/2016 1240   HGBUR SMALL (A) 09/29/2016 1240   BILIRUBINUR NEGATIVE 09/29/2016 1240   KETONESUR 5 (A) 09/29/2016 1240   PROTEINUR NEGATIVE 09/29/2016 1240   UROBILINOGEN 0.2 11/08/2009 1426   NITRITE NEGATIVE 09/29/2016 1240   LEUKOCYTESUR NEGATIVE 09/29/2016 1240    Sepsis Labs: Lactic Acid, Venous    Component Value Date/Time   LATICACIDVEN 1.38 09/29/2016 1431    MICROBIOLOGY: No results found for this or any previous visit (from the past 240 hour(s)).  RADIOLOGY STUDIES/RESULTS: Dg Chest 2 View  Result Date: 10/11/2016 CLINICAL DATA:  Cough and shortness of breath tonight. Recent diagnosis of pulmonary embolus. EXAM: CHEST  2 VIEW COMPARISON:  Radiograph 03/02/2016, CT 10/03/2016 FINDINGS: Lower lung volumes from prior exam. Mild emphysema. Tortuous thoracic aorta with atherosclerosis. The heart is normal in size. No pulmonary edema. Minimal left infrahilar atelectasis. No confluent  airspace disease, pleural effusion or pneumothorax. Advanced degenerative change of the left shoulder. IMPRESSION: Left lung base atelectasis. Thoracic aortic atherosclerosis. Electronically Signed   By: Rubye OaksMelanie  Ehinger M.D.   On: 10/11/2016 00:08   Dg Chest 2 View  Result Date: 09/29/2016 CLINICAL DATA: Chest pain EXAM: CHEST  2 VIEW COMPARISON:  September 01, 2016 FINDINGS: There is slight scarring in the right base. There is no edema or consolidation. Heart size and pulmonary vascularity are normal. No adenopathy. There is atherosclerotic calcification in the aorta. Aorta is tortuous but stable. There is arthropathy in each shoulder. IMPRESSION: No edema or  consolidation. Slight scarring right base. Stable cardiac silhouette. There aorta is tortuous with aortic atherosclerosis. Electronically Signed   By: Bretta BangWilliam  Woodruff III M.D.   On: 09/29/2016 13:30   Ct Angio Chest Pe W And/or Wo Contrast  Result Date: 10/03/2016 CLINICAL DATA:  Increased difficulty breathing.  Short of breath. EXAM: CT ANGIOGRAPHY CHEST WITH CONTRAST TECHNIQUE: Multidetector CT imaging of the chest was performed using the standard protocol during bolus administration of intravenous contrast. Multiplanar CT image reconstructions and MIPs were obtained to evaluate the vascular anatomy. CONTRAST:  75 mL Isovue COMPARISON:  Radiograph 10/02/2016 FINDINGS: Cardiovascular: A thin tubular filling defect within the LEFT upper lobe pulmonary artery (image 93, series 7). No additional pulmonary emboli. Overall clot burden is minimal. No RIGHT ventricular strain present. No pericardial fluid. No acute findings aorta great vessels. Mediastinum/Nodes: No axillary or supraclavicular lymphadenopathy. No mediastinal hilar adenopathy. No pericardial fluid. Lungs/Pleura: Centrilobular emphysema in the upper lobes. No pulmonary infarction. No airspace disease. No pleural fluid or pneumothorax Within the LEFT lower lobe 5 mm pulmonary nodule (image 32, series 8). Upper Abdomen: Limited view of the liver, kidneys, pancreas are unremarkable. Normal adrenal glands. Musculoskeletal: No aggressive osseous lesion. Review of the MIP images confirms the above findings. IMPRESSION: 1. Small acute pulmonary embolism within the LEFT upper lobe pulmonary artery. Overall clot burden is minimal. No RIGHT ventricular strain evident. 2. Coronary artery calcification and aortic atherosclerotic calcification. 3. **An incidental finding of potential clinical significance has been found. ** Small LEFT lower lobe pulmonary nodule. No follow-up needed if patient is low-risk. Non-contrast chest CT can be considered in 12 months if  patient is high-risk. This recommendation follows the consensus statement: Guidelines for Management of Incidental Pulmonary Nodules Detected on CT Images: From the Fleischner Society 2017; Radiology 2017; 284:228-243. Critical Value/emergent results were called by telephone at the time of interpretation on 10/03/2016 at 5:47 pm to Dr. Linwood DibblesJON KNAPP , who verbally acknowledged these results. Electronically Signed   By: Genevive BiStewart  Edmunds M.D.   On: 10/03/2016 17:49   Ct Abdomen Pelvis W Contrast  Result Date: 09/30/2016 CLINICAL DATA:  History of iliopsoas hematoma, recent falls with left-sided pain, initial encounter EXAM: CT ABDOMEN AND PELVIS WITH CONTRAST TECHNIQUE: Multidetector CT imaging of the abdomen and pelvis was performed using the standard protocol following bolus administration of intravenous contrast. CONTRAST:  75 mL Isovue-300 COMPARISON:  09/01/2016. FINDINGS: Lower chest: Bilateral lower lobe atelectasis. No sizable effusion is seen. Small hiatal hernia is noted. Hepatobiliary: No focal liver abnormality is seen. No gallstones, gallbladder wall thickening, or biliary dilatation. Pancreas: Unremarkable. No pancreatic ductal dilatation or surrounding inflammatory changes. Spleen: Multiple calcified granulomas are noted. The spleen is otherwise within normal limits. Adrenals/Urinary Tract: Renal vascular calcifications are noted. No calculi are seen. No obstructive changes are noted. The adrenal glands show mild fullness on the left likely related to  hyperplasia. Stomach/Bowel: Diffuse diverticular change of the colon is noted. The appendix is within normal limits. No inflammatory changes are noted. Vascular/Lymphatic: Diffuse aortic calcifications are noted. There are changes consistent within infrarenal aortic aneurysm. It measures 6.8 x 6.4 cm in greatest AP and transverse dimensions respectively. Significant mural thrombus is identified no extravasation is identified. The aneurysm extends to the  aortic bifurcation. Diffuse calcifications are noted throughout the iliac vessels without aneurysmal dilatation. Reproductive: Uterus and bilateral adnexa are unremarkable. Other: Left inguinal hernia is noted with multiple loops of small bowel within. No obstructive changes are seen. This is stable from the prior exam. Musculoskeletal: Degenerative changes of lumbar spine are seen. IMPRESSION: Mild bibasilar atelectatic changes without pleural effusion. Abdominal aortic aneurysm as described. Vascular surgery consultation recommended due to increased risk of rupture for AAA >5.5 cm. This recommendation follows ACR consensus guidelines: White Paper of the ACR Incidental Findings Committee II on Vascular Findings. J Am Coll Radiol 2013; 10:789-794. Diverticulosis without diverticulitis. Left inguinal hernia containing small bowel loops without incarceration. Electronically Signed   By: Alcide Clever M.D.   On: 09/30/2016 15:56   Dg Chest Port 1 View  Result Date: 10/18/2016 CLINICAL DATA:  Dyspnea EXAM: PORTABLE CHEST 1 VIEW COMPARISON:  10/10/2016 CXR FINDINGS: The heart size and mediastinal contours are within normal limits. Aortic atherosclerosis is noted. Patchy airspace disease at the right lung base suspicious for pneumonia. Subsegmental can platelike atelectasis at the left lung base. Osteoarthritis of the glenohumeral joints. IMPRESSION: Patchy new airspace opacity at the right lung base suspicious for pneumonia. Left lower lobe atelectasis. Electronically Signed   By: Tollie Eth M.D.   On: 10/18/2016 23:58   Dg Chest Port 1 View  Result Date: 10/02/2016 CLINICAL DATA:  Shortness of breath. EXAM: PORTABLE CHEST 1 VIEW COMPARISON:  09/29/2016 FINDINGS: Lungs are hyperinflated. There is mild perihilar peribronchial thickening. No focal consolidations or pleural effusions are identified. Chronic changes are identified in the left shoulder. IMPRESSION: Hyperinflation and bronchitic changes. No focal acute  pulmonary abnormality. Electronically Signed   By: Norva Pavlov M.D.   On: 10/02/2016 16:23     LOS: 1 day   Jeoffrey Massed, MD  [facility] Pager:[phone]  If 7PM-7AM, please contact night-coverage [email or web address] Password [email or web address] 10/19/2016, 2:42 PM

## 2016-10-19 NOTE — Consult Note (Signed)
Reason for Consult: Anemia and heme positive stool Referring Physician: [facility]  Carrie Pena HPI: This is an 83 year old female with a PMH of HTN, hyperlipidemia, COPD, diastolic CHF, and a recent diagnosis of PE (09/2016) admitted for a drop in her HGB from the baseline of 8 down to the 6 range.  Prior to her admission she did suffer with epistaxis on Xarelto.  Per her report, she denies any hematochezia or melena.  As a result of the findings a GI consultation was requested.  She denies a prior EGD, but in the distant past she had a colonoscopy.  Past Medical History:  Diagnosis Date  . Carpal tunnel syndrome   . COPD (chronic obstructive pulmonary disease) (HCC)   . GERD (gastroesophageal reflux disease)   . Hyperlipidemia   . Hypertension   . Osteoarthritis     Past Surgical History:  Procedure Laterality Date  . KNEE ARTHROSCOPY    . left carpal tunnel    . TONSILLECTOMY      Family History  Problem Relation Age of Onset  . Cancer Mother   . Cancer Father   . Hypertension Daughter   . Heart disease Daughter     before age 59    Social History:  reports that she quit smoking about 19 months ago. Her smoking use included Cigarettes. She smoked 0.50 packs per day. She has never used smokeless tobacco. She reports that she does not drink alcohol or use drugs.  Allergies:  Allergies  Allergen Reactions  . Tylenol [Acetaminophen] Other (See Comments)    Reaction:  Shaking     Medications:  Scheduled: . atorvastatin  20 mg Oral q1800  . cilostazol  100 mg Oral BID  . furosemide  40 mg Intravenous Daily  . ipratropium-albuterol  3 mL Nebulization Q8H  .  morphine injection  2 mg Intravenous Once  . pantoprazole  40 mg Oral BID  . potassium chloride  20 mEq Oral Daily  . [START ON 10/20/2016] predniSONE  30 mg Oral QAC breakfast   Continuous:   Results for orders placed or performed during the hospital encounter of 10/18/16 (from the past 24 hour(s))   Type and screen [facility]     Status: None (Preliminary result)   Collection Time: 10/18/16  6:25 PM  Result Value Ref Range   ABO/RH(D) O NEG    Antibody Screen NEG    Sample Expiration 10/21/2016    Unit Number [id number]    Blood Component Type RED CELLS,LR    Unit division 00    Status of Unit ISSUED,FINAL    Transfusion Status OK TO TRANSFUSE    Crossmatch Result Compatible    Unit Number [id number]    Blood Component Type RBC LR PHER2    Unit division 00    Status of Unit ISSUED    Transfusion Status OK TO TRANSFUSE    Crossmatch Result Compatible   ABO/Rh     Status: None   Collection Time: 10/18/16  6:25 PM  Result Value Ref Range   ABO/RH(D) O NEG   Comprehensive metabolic panel     Status: Abnormal   Collection Time: 10/18/16  7:59 PM  Result Value Ref Range   Sodium 137 135 - 145 mmol/L   Potassium 4.4 3.5 - 5.1 mmol/L   Chloride 106 101 - 111 mmol/L   CO2 24 22 - 32 mmol/L   Glucose, Bld 117 (H) 65 - 99 mg/dL  BUN 30 (H) 6 - 20 mg/dL   Creatinine, Ser 4.691.25 (H) 0.44 - 1.00 mg/dL   Calcium 8.1 (L) 8.9 - 10.3 mg/dL   Total Protein 6.1 (L) 6.5 - 8.1 g/dL   Albumin 2.5 (L) 3.5 - 5.0 g/dL   AST 18 15 - 41 U/L   ALT 13 (L) 14 - 54 U/L   Alkaline Phosphatase 55 38 - 126 U/L   Total Bilirubin 0.3 0.3 - 1.2 mg/dL   GFR calc non Af Amer 38 (L) >60 mL/min   GFR calc Af Amer 44 (L) >60 mL/min   Anion gap 7 5 - 15  CBC     Status: Abnormal   Collection Time: 10/18/16  7:59 PM  Result Value Ref Range   WBC 11.6 (H) 4.0 - 10.5 K/uL   RBC 1.82 (L) 3.87 - 5.11 MIL/uL   Hemoglobin 6.0 (LL) 12.0 - 15.0 g/dL   HCT 62.919.3 (L) 52.836.0 - 41.346.0 %   MCV 106.0 (H) 78.0 - 100.0 fL   MCH 33.0 26.0 - 34.0 pg   MCHC 31.1 30.0 - 36.0 g/dL   RDW 24.417.4 (H) 01.011.5 - 27.215.5 %   Platelets 335 150 - 400 K/uL  Protime-INR     Status: Abnormal   Collection Time: 10/18/16  9:20 PM  Result Value Ref Range   Prothrombin Time 22.2 (H) 11.4 - 15.2 seconds   INR 1.91    Vitamin B12     Status: None   Collection Time: 10/18/16  9:20 PM  Result Value Ref Range   Vitamin B-12 402 180 - 914 pg/mL  Folate     Status: None   Collection Time: 10/18/16  9:20 PM  Result Value Ref Range   Folate 8.9 >5.9 ng/mL  Iron and TIBC     Status: Abnormal   Collection Time: 10/18/16  9:20 PM  Result Value Ref Range   Iron 22 (L) 28 - 170 ug/dL   TIBC 536251 644250 - 034450 ug/dL   Saturation Ratios 9 (L) 10.4 - 31.8 %   UIBC 229 ug/dL  Ferritin     Status: None   Collection Time: 10/18/16  9:20 PM  Result Value Ref Range   Ferritin 77 11 - 307 ng/mL  Reticulocytes     Status: Abnormal   Collection Time: 10/18/16  9:20 PM  Result Value Ref Range   Retic Ct Pct 8.5 (H) 0.4 - 3.1 %   RBC. 1.89 (L) 3.87 - 5.11 MIL/uL   Retic Count, Manual 160.7 19.0 - 186.0 K/uL  POC occult blood, ED     Status: Abnormal   Collection Time: 10/18/16  9:57 PM  Result Value Ref Range   Fecal Occult Bld POSITIVE (A) NEGATIVE  Prepare RBC     Status: None   Collection Time: 10/18/16 10:00 PM  Result Value Ref Range   Order Confirmation ORDER PROCESSED BY BLOOD BANK   CBC     Status: Abnormal   Collection Time: 10/19/16  4:55 AM  Result Value Ref Range   WBC 11.2 (H) 4.0 - 10.5 K/uL   RBC 2.52 (L) 3.87 - 5.11 MIL/uL   Hemoglobin 7.7 (L) 12.0 - 15.0 g/dL   HCT 74.223.7 (L) 59.536.0 - 63.846.0 %   MCV 94.0 78.0 - 100.0 fL   MCH 30.6 26.0 - 34.0 pg   MCHC 32.5 30.0 - 36.0 g/dL   RDW 75.623.1 (H) 43.311.5 - 29.515.5 %   Platelets 275 150 - 400 K/uL  Basic metabolic panel     Status: Abnormal   Collection Time: 10/19/16  4:55 AM  Result Value Ref Range   Sodium 137 135 - 145 mmol/L   Potassium 4.0 3.5 - 5.1 mmol/L   Chloride 105 101 - 111 mmol/L   CO2 27 22 - 32 mmol/L   Glucose, Bld 87 65 - 99 mg/dL   BUN 26 (H) 6 - 20 mg/dL   Creatinine, Ser 1.61 (H) 0.44 - 1.00 mg/dL   Calcium 7.9 (L) 8.9 - 10.3 mg/dL   GFR calc non Af Amer 47 (L) >60 mL/min   GFR calc Af Amer 54 (L) >60 mL/min   Anion gap 5 5 - 15   Troponin I     Status: None   Collection Time: 10/19/16  4:55 AM  Result Value Ref Range   Troponin I <0.03 <0.03 ng/mL  Troponin I     Status: None   Collection Time: 10/19/16 10:44 AM  Result Value Ref Range   Troponin I <0.03 <0.03 ng/mL  Hemoglobin and hematocrit, blood     Status: Abnormal   Collection Time: 10/19/16 10:44 AM  Result Value Ref Range   Hemoglobin 8.7 (L) 12.0 - 15.0 g/dL   HCT 09.6 (L) 04.5 - 40.9 %     Dg Chest Port 1 View  Result Date: 10/18/2016 CLINICAL DATA:  Dyspnea EXAM: PORTABLE CHEST 1 VIEW COMPARISON:  10/10/2016 CXR FINDINGS: The heart size and mediastinal contours are within normal limits. Aortic atherosclerosis is noted. Patchy airspace disease at the right lung base suspicious for pneumonia. Subsegmental can platelike atelectasis at the left lung base. Osteoarthritis of the glenohumeral joints. IMPRESSION: Patchy new airspace opacity at the right lung base suspicious for pneumonia. Left lower lobe atelectasis. Electronically Signed   By: Tollie Eth M.D.   On: 10/18/2016 23:58    ROS:  As stated above in the HPI otherwise negative.  Blood pressure 124/61, pulse 85, temperature 97.8 F (36.6 C), temperature source Oral, resp. rate 18, height 5\' 3"  (1.6 m), weight 70.1 kg (154 lb 9.6 oz), SpO2 96 %.    PE: Gen: NAD, Alert and Oriented HEENT:  [place]/AT, EOMI Neck: Supple, no LAD Lungs: CTA Bilaterally CV: RRR without M/G/R ABM: Soft, NTND, +BS Ext: No C/C/E  Assessment/Plan: 1) Anemia. 2) Heme positive stool. 3) PE on Xarelto. 4) Recent epistaxis. 5) Large abdominal aortic aneurysm.   I am suspicious that her anemia is secondary to her epistaxis, but a two gram drop is a significant amount.  An upper endoscopy is not unreasonable at this time.  There is the possibility that her melena/anemia can be as a result of an aortic enteric fistula, but typically it presents with a sentinel bleed.  No extravasation was noted with the CT scan.  Plan: 1)  EGD tomorrow.  I spoke with her daughter Efraim Kaufmann at the patient's request and she consents to the procedure.  [phone].  Jafeth Mustin D 10/19/2016, 3:51 PM

## 2016-10-19 NOTE — Progress Notes (Addendum)
*  Preliminary Results* Bilateral lower extremity venous duplex completed. Bilateral lower extremities are negative for deep vein thrombosis. There is no evidence of Baker's cyst bilaterally.  There is no evidence of change when compared to prior study from 10/04/16.  10/19/2016 2:30 PM Gertie FeyMichelle Reiner Loewen, BS, RVT, RDCS, RDMS

## 2016-10-20 ENCOUNTER — Inpatient Hospital Stay (HOSPITAL_COMMUNITY): Payer: Medicare Other

## 2016-10-20 ENCOUNTER — Other Ambulatory Visit: Payer: Self-pay | Admitting: *Deleted

## 2016-10-20 LAB — TYPE AND SCREEN
BLOOD PRODUCT EXPIRATION DATE: 201801082359
Blood Product Expiration Date: 201801122359
ISSUE DATE / TIME: 201801022111
ISSUE DATE / TIME: 201801030018
UNIT TYPE AND RH: 9500
Unit Type and Rh: 9500

## 2016-10-20 LAB — CBC
HCT: 27.4 % — ABNORMAL LOW (ref 36.0–46.0)
Hemoglobin: 8.8 g/dL — ABNORMAL LOW (ref 12.0–15.0)
MCH: 30.4 pg (ref 26.0–34.0)
MCHC: 32.1 g/dL (ref 30.0–36.0)
MCV: 94.8 fL (ref 78.0–100.0)
PLATELETS: 290 10*3/uL (ref 150–400)
RBC: 2.89 MIL/uL — AB (ref 3.87–5.11)
RDW: 22.6 % — AB (ref 11.5–15.5)
WBC: 11.7 10*3/uL — AB (ref 4.0–10.5)

## 2016-10-20 LAB — BASIC METABOLIC PANEL
ANION GAP: 7 (ref 5–15)
BUN: 21 mg/dL — AB (ref 6–20)
CALCIUM: 8.3 mg/dL — AB (ref 8.9–10.3)
CO2: 28 mmol/L (ref 22–32)
Chloride: 102 mmol/L (ref 101–111)
Creatinine, Ser: 1.07 mg/dL — ABNORMAL HIGH (ref 0.44–1.00)
GFR calc Af Amer: 53 mL/min — ABNORMAL LOW (ref >60)
GFR, EST NON AFRICAN AMERICAN: 46 mL/min — AB (ref >60)
GLUCOSE: 84 mg/dL (ref 65–99)
POTASSIUM: 3.9 mmol/L (ref 3.5–5.1)
SODIUM: 137 mmol/L (ref 135–145)

## 2016-10-20 MED ORDER — PIPERACILLIN-TAZOBACTAM 3.375 G IVPB
3.3750 g | Freq: Three times a day (TID) | INTRAVENOUS | Status: DC
  Administered 2016-10-20 – 2016-10-22 (×6): 3.375 g via INTRAVENOUS
  Filled 2016-10-20 (×8): qty 50

## 2016-10-20 MED ORDER — IPRATROPIUM-ALBUTEROL 0.5-2.5 (3) MG/3ML IN SOLN
3.0000 mL | Freq: Three times a day (TID) | RESPIRATORY_TRACT | Status: DC
  Administered 2016-10-20: 3 mL via RESPIRATORY_TRACT
  Filled 2016-10-20: qty 3

## 2016-10-20 MED ORDER — VANCOMYCIN HCL IN DEXTROSE 1-5 GM/200ML-% IV SOLN
1000.0000 mg | INTRAVENOUS | Status: DC
  Administered 2016-10-20: 1000 mg via INTRAVENOUS
  Filled 2016-10-20 (×2): qty 200

## 2016-10-20 MED ORDER — IPRATROPIUM-ALBUTEROL 0.5-2.5 (3) MG/3ML IN SOLN
3.0000 mL | Freq: Four times a day (QID) | RESPIRATORY_TRACT | Status: DC
  Filled 2016-10-20: qty 3

## 2016-10-20 MED ORDER — FUROSEMIDE 10 MG/ML IJ SOLN
40.0000 mg | Freq: Two times a day (BID) | INTRAMUSCULAR | Status: DC
  Administered 2016-10-20 – 2016-10-21 (×2): 40 mg via INTRAVENOUS
  Filled 2016-10-20 (×2): qty 4

## 2016-10-20 MED ORDER — METHYLPREDNISOLONE SODIUM SUCC 125 MG IJ SOLR
60.0000 mg | Freq: Two times a day (BID) | INTRAMUSCULAR | Status: DC
  Administered 2016-10-20 – 2016-10-22 (×5): 60 mg via INTRAVENOUS
  Filled 2016-10-20 (×5): qty 2

## 2016-10-20 MED ORDER — SALINE SPRAY 0.65 % NA SOLN
1.0000 | NASAL | Status: DC | PRN
  Filled 2016-10-20: qty 44

## 2016-10-20 NOTE — Progress Notes (Signed)
PROGRESS NOTE        PATIENT DETAILS Name: Carrie Pena Age: 83 y.o. Sex: female Date of Birth: 08-Mar-1928 Admit Date: 10/18/2016 Admitting Physician Clydie Braun, MD ZOX:WRUEAVWUJ,WJXBJ, MD  Brief Narrative: Patient is a 83 y.o female with history of COPD, recent diagnosis of a  pulmonary embolism (10/03/16) on Xarelto admitted for evaluation of acute on chronic anemia. Transfused 2 units of PRBC. GI consulted-plans are for EGD-however hospital course complicated by worsening hypoxemia likely due to HCAP/COPD exacerbation. See below for details.   Subjective: Worsening SOB-requiring increased Oxygen.+cough    Assessment/Plan: Principal Problem:  Likely acute blood loss anemia on top of anemia of chronic disease:Acute blood loss likely due to occult GI bleed-although had some epistaxis, Hb drop seems disproportionately low. Hb stable at 8.8-after 2 units of PRBC. Follow CBC  ?GI Bleed: likely causing above-was on Xarelto for PE-no overt bleeding-but FOBT +-GI planning EGD. Continue PPI   Acute on chronic respiratory failure with hypoxia: Developed worsening hypoxia last night-was recently discharge to SNF on 2 L of O2. CXR confirms RLL PNA. Suspect etiology of hypoxia to be from PNA,?CHF (leg edema) and underlying COPD with mild exacerbation (wheezing). Plans are to start empiric Abx, increase Lasix to BID, start steroids and bronchodilators. Recent PE was very small-and likely not playing a role in hypoxia-furthermore lower ext dopplers neg. Given severity of anemia-holding anticoagulation at present.  Explained to grand daughter over the phone-difficult situation regarding anticoagulation.   HCAP: obtain blood cultures-started Vanco/Zosyn. See above regarding hypoxia  COPD exacerbation: wheezing this am-with increasing O2 requirements. See above-regarding steroids etc  Recent diagnosis of pulmonary embolism: Very small pulmonary embolism on CT angiogram  done on 12/18-lower extremity Dopplers negative for DVT-given advanced age, severity of anemia-possible acute blood loss-poor long-term candidate for anticoagulation-holding anticoagulation till endoscopic evaluation is complete.  Lower extremity edema: Appears chronic-likely secondary to hypoalbuminemia, amlodipine use, although has grade 1 diastolic dysfunction on recent echocardiogram-but does not have any other signs of CHF-but with worsening hypoxia have increased Lasix to BID dosing. Follow weights-keep in neg balance.   ?Acute on Chronic diastolic heart failure: See above.  History of peripheral vascular disease: Resume aspirin after endoscopic evaluation is complete and continue cilostazol.  Dyslipidemia: Continue statin  GERD: Continue PPI  Hypertension: Blood pressure currently controlled-avoid amlodipine given significant lower extremity edema-will start alternative antihypertensive agent when able   DVT Prophylaxis: Compression boot therapy. No longer taking Xeralto due to possible occult bleed.   Code Status: DNR-Confirmed with grand daughter over the phone  Family Communication: Spoke with grand daughter over the phone-at patient's request  Disposition Plan: Remain inpatient-but will plan on Home health vs SNF on discharge. Follow up with family via phone to determine what Home Health plan is.   Antimicrobial agents: Anti-infectives    None - will start empiric IV abx for pneumonia if dx is confirmed with today's CXR result.       Procedures: Endoscopy rescheduled for 10/20/16.   CXR 2 view AP & lateral ordered.   CONSULTS: None   Time spent: 25 minutes-Greater than 50% of this time was spent in counseling, explanation of diagnosis, planning of further management, and coordination of care.  MEDICATIONS: Scheduled Meds: . atorvastatin  20 mg Oral q1800  . cilostazol  100 mg Oral BID  . furosemide  40 mg  Intravenous BID  . ipratropium-albuterol  3 mL  Nebulization Q6H  . methylPREDNISolone (SOLU-MEDROL) injection  60 mg Intravenous Q12H  .  morphine injection  2 mg Intravenous Once  . pantoprazole  40 mg Oral BID  . potassium chloride  20 mEq Oral Daily      Continuous Infusions: . sodium chloride     PRN Meds:.albuterol, ALPRAZolam, ondansetron **OR** ondansetron (ZOFRAN) IV, sodium chloride (OCEAN) nasal spray    PHYSICAL EXAM: Vital signs: Vitals:   10/20/16 0452 10/20/16 0617 10/20/16 0749 10/20/16 0900  BP:    125/80  Pulse:    85  Resp:    (!) 22  Temp:    98.5 F (36.9 C)  TempSrc:    Oral  SpO2: (!) 88% 94% 91% 90%  Weight:      Height:       Filed Weights   10/18/16 1736 10/18/16 2320 10/19/16 2124  Weight: 68 kg (150 lb) 70.1 kg (154 lb 9.6 oz) 68.9 kg (152 lb)   Body mass index is 26.93 kg/m.   General appearance :Awake, alert, not in any distress. Speech Clear. Not toxic Looking Resp:Good air entry bilaterally, left lung clear to auscultation. R. Lower lobe wheezing on auscultation.  CVS: S1 S2 regular, no murmurs.  GI: Bowel sounds present, Non tender and not distended with no gaurding, rigidity or rebound.No organomegaly Extremities: Both legs are warm to touch, bilateral lower extremity 3+ edema present, edema does not extend fully to knee.   Neurology:  speech clear,Non focal, sensation is grossly intact. Psychiatric: Normal judgment and insight. Alert and oriented x 3. Normal mood. Musculoskeletal:No digital cyanosis Skin:No Rash, warm and dry  I have personally reviewed following labs and imaging studies  LABORATORY DATA: CBC:  Recent Labs Lab 10/18/16 1959 10/19/16 0455 10/19/16 1044 10/20/16 0516  WBC 11.6* 11.2*  --  11.7*  HGB 6.0* 7.7* 8.7* 8.8*  HCT 19.3* 23.7* 27.2* 27.4*  MCV 106.0* 94.0  --  94.8  PLT 335 275  --  290  Slight leukocytosis noted, along with low Hgb and Hct. Encouraged Hgb is trending up. Endoscopy will help r/o occult blood loss. CXR will help r/o presence of  pneumonia.   Basic Metabolic Panel:  Recent Labs Lab 10/18/16 1959 10/19/16 0455 10/20/16 0516  NA 137 137 137  K 4.4 4.0 3.9  CL 106 105 102  CO2 24 27 28   GLUCOSE 117* 87 84  BUN 30* 26* 21*  CREATININE 1.25* 1.05* 1.07*  CALCIUM 8.1* 7.9* 8.3*    GFR: Estimated Creatinine Clearance: 35.2 mL/min (by C-G formula based on SCr of 1.07 mg/dL (H)).  Liver Function Tests:  Recent Labs Lab 10/18/16 1959  AST 18  ALT 13*  ALKPHOS 55  BILITOT 0.3  PROT 6.1*  ALBUMIN 2.5*   No results for input(s): LIPASE, AMYLASE in the last 168 hours. No results for input(s): AMMONIA in the last 168 hours.  Coagulation Profile:  Recent Labs Lab 10/18/16 2120  INR 1.91    Cardiac Enzymes:  Recent Labs Lab 10/19/16 0455 10/19/16 1044  TROPONINI <0.03 <0.03    BNP (last 3 results) No results for input(s): PROBNP in the last 8760 hours.  HbA1C: No results for input(s): HGBA1C in the last 72 hours.  CBG: No results for input(s): GLUCAP in the last 168 hours.  Lipid Profile: No results for input(s): CHOL, HDL, LDLCALC, TRIG, CHOLHDL, LDLDIRECT in the last 72 hours.  Thyroid Function Tests: No results for input(s):  TSH, T4TOTAL, FREET4, T3FREE, THYROIDAB in the last 72 hours.  Anemia Panel:  Recent Labs  10/18/16 2120  VITAMINB12 402  FOLATE 8.9  FERRITIN 77  TIBC 251  IRON 22*  RETICCTPCT 8.5*    Urine analysis:    Component Value Date/Time   COLORURINE YELLOW 09/29/2016 1240   APPEARANCEUR HAZY (A) 09/29/2016 1240   LABSPEC 1.010 09/29/2016 1240   PHURINE 5.0 09/29/2016 1240   GLUCOSEU NEGATIVE 09/29/2016 1240   HGBUR SMALL (A) 09/29/2016 1240   BILIRUBINUR NEGATIVE 09/29/2016 1240   KETONESUR 5 (A) 09/29/2016 1240   PROTEINUR NEGATIVE 09/29/2016 1240   UROBILINOGEN 0.2 11/08/2009 1426   NITRITE NEGATIVE 09/29/2016 1240   LEUKOCYTESUR NEGATIVE 09/29/2016 1240    Sepsis Labs: Lactic Acid, Venous    Component Value Date/Time   LATICACIDVEN 1.38  09/29/2016 1431    MICROBIOLOGY: No results found for this or any previous visit (from the past 240 hour(s)).  RADIOLOGY STUDIES/RESULTS: Dg Chest 2 View  Result Date: 10/11/2016 CLINICAL DATA:  Cough and shortness of breath tonight. Recent diagnosis of pulmonary embolus. EXAM: CHEST  2 VIEW COMPARISON:  Radiograph 03/02/2016, CT 10/03/2016 FINDINGS: Lower lung volumes from prior exam. Mild emphysema. Tortuous thoracic aorta with atherosclerosis. The heart is normal in size. No pulmonary edema. Minimal left infrahilar atelectasis. No confluent airspace disease, pleural effusion or pneumothorax. Advanced degenerative change of the left shoulder. IMPRESSION: Left lung base atelectasis. Thoracic aortic atherosclerosis. Electronically Signed   By: Rubye Oaks M.D.   On: 10/11/2016 00:08   Dg Chest 2 View  Result Date: 09/29/2016 CLINICAL DATA: Chest pain EXAM: CHEST  2 VIEW COMPARISON:  September 01, 2016 FINDINGS: There is slight scarring in the right base. There is no edema or consolidation. Heart size and pulmonary vascularity are normal. No adenopathy. There is atherosclerotic calcification in the aorta. Aorta is tortuous but stable. There is arthropathy in each shoulder. IMPRESSION: No edema or consolidation. Slight scarring right base. Stable cardiac silhouette. There aorta is tortuous with aortic atherosclerosis. Electronically Signed   By: Bretta Bang III M.D.   On: 09/29/2016 13:30   Ct Angio Chest Pe W And/or Wo Contrast  Result Date: 10/03/2016 CLINICAL DATA:  Increased difficulty breathing.  Short of breath. EXAM: CT ANGIOGRAPHY CHEST WITH CONTRAST TECHNIQUE: Multidetector CT imaging of the chest was performed using the standard protocol during bolus administration of intravenous contrast. Multiplanar CT image reconstructions and MIPs were obtained to evaluate the vascular anatomy. CONTRAST:  75 mL Isovue COMPARISON:  Radiograph 10/02/2016 FINDINGS: Cardiovascular: A thin  tubular filling defect within the LEFT upper lobe pulmonary artery (image 93, series 7). No additional pulmonary emboli. Overall clot burden is minimal. No RIGHT ventricular strain present. No pericardial fluid. No acute findings aorta great vessels. Mediastinum/Nodes: No axillary or supraclavicular lymphadenopathy. No mediastinal hilar adenopathy. No pericardial fluid. Lungs/Pleura: Centrilobular emphysema in the upper lobes. No pulmonary infarction. No airspace disease. No pleural fluid or pneumothorax Within the LEFT lower lobe 5 mm pulmonary nodule (image 32, series 8). Upper Abdomen: Limited view of the liver, kidneys, pancreas are unremarkable. Normal adrenal glands. Musculoskeletal: No aggressive osseous lesion. Review of the MIP images confirms the above findings. IMPRESSION: 1. Small acute pulmonary embolism within the LEFT upper lobe pulmonary artery. Overall clot burden is minimal. No RIGHT ventricular strain evident. 2. Coronary artery calcification and aortic atherosclerotic calcification. 3. **An incidental finding of potential clinical significance has been found. ** Small LEFT lower lobe pulmonary nodule. No follow-up needed if  patient is low-risk. Non-contrast chest CT can be considered in 12 months if patient is high-risk. This recommendation follows the consensus statement: Guidelines for Management of Incidental Pulmonary Nodules Detected on CT Images: From the Fleischner Society 2017; Radiology 2017; 284:228-243. Critical Value/emergent results were called by telephone at the time of interpretation on 10/03/2016 at 5:47 pm to Dr. Linwood DibblesJON KNAPP , who verbally acknowledged these results. Electronically Signed   By: Genevive BiStewart  Edmunds M.D.   On: 10/03/2016 17:49   Ct Abdomen Pelvis W Contrast  Result Date: 09/30/2016 CLINICAL DATA:  History of iliopsoas hematoma, recent falls with left-sided pain, initial encounter EXAM: CT ABDOMEN AND PELVIS WITH CONTRAST TECHNIQUE: Multidetector CT imaging of the  abdomen and pelvis was performed using the standard protocol following bolus administration of intravenous contrast. CONTRAST:  75 mL Isovue-300 COMPARISON:  09/01/2016. FINDINGS: Lower chest: Bilateral lower lobe atelectasis. No sizable effusion is seen. Small hiatal hernia is noted. Hepatobiliary: No focal liver abnormality is seen. No gallstones, gallbladder wall thickening, or biliary dilatation. Pancreas: Unremarkable. No pancreatic ductal dilatation or surrounding inflammatory changes. Spleen: Multiple calcified granulomas are noted. The spleen is otherwise within normal limits. Adrenals/Urinary Tract: Renal vascular calcifications are noted. No calculi are seen. No obstructive changes are noted. The adrenal glands show mild fullness on the left likely related to hyperplasia. Stomach/Bowel: Diffuse diverticular change of the colon is noted. The appendix is within normal limits. No inflammatory changes are noted. Vascular/Lymphatic: Diffuse aortic calcifications are noted. There are changes consistent within infrarenal aortic aneurysm. It measures 6.8 x 6.4 cm in greatest AP and transverse dimensions respectively. Significant mural thrombus is identified no extravasation is identified. The aneurysm extends to the aortic bifurcation. Diffuse calcifications are noted throughout the iliac vessels without aneurysmal dilatation. Reproductive: Uterus and bilateral adnexa are unremarkable. Other: Left inguinal hernia is noted with multiple loops of small bowel within. No obstructive changes are seen. This is stable from the prior exam. Musculoskeletal: Degenerative changes of lumbar spine are seen. IMPRESSION: Mild bibasilar atelectatic changes without pleural effusion. Abdominal aortic aneurysm as described. Vascular surgery consultation recommended due to increased risk of rupture for AAA >5.5 cm. This recommendation follows ACR consensus guidelines: White Paper of the ACR Incidental Findings Committee II on  Vascular Findings. J Am Coll Radiol 2013; 10:789-794. Diverticulosis without diverticulitis. Left inguinal hernia containing small bowel loops without incarceration. Electronically Signed   By: Alcide CleverMark  Lukens M.D.   On: 09/30/2016 15:56   Dg Chest Port 1 View  Result Date: 10/18/2016 CLINICAL DATA:  Dyspnea EXAM: PORTABLE CHEST 1 VIEW COMPARISON:  10/10/2016 CXR FINDINGS: The heart size and mediastinal contours are within normal limits. Aortic atherosclerosis is noted. Patchy airspace disease at the right lung base suspicious for pneumonia. Subsegmental can platelike atelectasis at the left lung base. Osteoarthritis of the glenohumeral joints. IMPRESSION: Patchy new airspace opacity at the right lung base suspicious for pneumonia. Left lower lobe atelectasis. Electronically Signed   By: Tollie Ethavid  Kwon M.D.   On: 10/18/2016 23:58   Dg Chest Port 1 View  Result Date: 10/02/2016 CLINICAL DATA:  Shortness of breath. EXAM: PORTABLE CHEST 1 VIEW COMPARISON:  09/29/2016 FINDINGS: Lungs are hyperinflated. There is mild perihilar peribronchial thickening. No focal consolidations or pleural effusions are identified. Chronic changes are identified in the left shoulder. IMPRESSION: Hyperinflation and bronchitic changes. No focal acute pulmonary abnormality. Electronically Signed   By: Norva PavlovElizabeth  Brown M.D.   On: 10/02/2016 16:23     LOS: 2 days   Okey Dupreose  [place], PAS  [employer]   If 7PM-7AM, please contact night-coverage [email or web address] Password [facility] 10/20/2016, 10:59 AM  Attending MD note  Patient was seen, examined,treatment plan was discussed with the PA-S.  I have personally reviewed the clinical findings, lab, imaging studies and management of this patient in detail. I agree with the documentation, as recorded by the PA-S.   Patient is much more SOB than usual. Requiring 5 L of O2  On Exam: Gen. exam: Awake, alert, not in any distress Chest: Good air entry bilaterally, no rhonchi or rales CVS:  S1-S2 regular, no murmurs Abdomen: Soft, nontender and nondistended Neurology: Non-focal Skin: No rash or lesions  Imp HCAP/COPD exac-likely causing worsening hypoxia ?Acute Diastolic CHF Anemia-likely due to blood loss from GI bleed  Plan IV Lasix BID, IV Vanco/zosyn, add solumedrol, Continue bronchodilators Spoke with family-grand daughter-explained limitations to care given age/GI bleeding-inability to fully anticoagulate DNR confirmed Follow clinical course  Rest as above  [facility]

## 2016-10-20 NOTE — Evaluation (Signed)
Physical Therapy Evaluation Patient Details Name: Carrie Pena MRN: [id number] DOB: 07-07-1928 Today's Date: 10/20/2016   History of Present Illness  Patient is a 83 y.o. female with history of COPD, recent diagnosis of a  pulmonary embolism (10/03/16) on Xarelto admitted for evaluation of acute on chronic anemia.   Clinical Impression  Pt admitted with/for chronic anemia.  Pt currently limited functionally due to the problems listed. ( See problems list.)   Pt will benefit from PT to maximize function and safety in order to get ready for next venue listed below.     Follow Up Recommendations SNF    Equipment Recommendations  None recommended by PT    Recommendations for Other Services       Precautions / Restrictions Precautions Precautions: Fall      Mobility  Bed Mobility               General bed mobility comments: oob in recliner  Transfers Overall transfer level: Needs assistance   Transfers: Sit to/from Stand Sit to Stand: Total assist         General transfer comment: face to face assist to stand from recliner.  Assist to come up and forward.  full support once standing.  Ambulation/Gait             General Gait Details: NT-pt unable  Stairs            Wheelchair Mobility    Modified Rankin (Stroke Patients Only)       Balance Overall balance assessment: Needs assistance Sitting-balance support: Bilateral upper extremity supported;Single extremity supported Sitting balance-Leahy Scale: Poor Sitting balance - Comments: needing UE's to maintain sitting in the chair against the slant of the seating surface.     Standing balance-Leahy Scale: Zero                               Pertinent Vitals/Pain Pain Assessment: No/denies pain    Home Living Family/patient expects to be discharged to:: Skilled nursing facility                      Prior Function Level of Independence: Needs assistance          Comments: w/c level.  PCA 2x/wk     Hand Dominance   Dominant Hand: Right    Extremity/Trunk Assessment        Lower Extremity Assessment Lower Extremity Assessment: RLE deficits/detail;LLE deficits/detail RLE Deficits / Details: significant weaknesses, hip flexion 2+, quads 3+, hams 3-, df/pf 2 RLE Coordination: decreased fine motor LLE Coordination: decreased fine motor       Communication   Communication: HOH  Cognition Arousal/Alertness: Awake/alert Behavior During Therapy: WFL for tasks assessed/performed Overall Cognitive Status: Within Functional Limits for tasks assessed                      General Comments      Exercises     Assessment/Plan    PT Assessment Patient needs continued PT services  PT Problem List Decreased strength;Decreased balance;Decreased mobility;Decreased coordination;Decreased safety awareness;Decreased knowledge of precautions          PT Treatment Interventions DME instruction;Functional mobility training;Therapeutic activities;Balance training;Therapeutic exercise;Patient/family education    PT Goals (Current goals can be found in the Care Plan section)  Acute Rehab PT Goals Patient Stated Goal: breathe better PT Goal Formulation: With patient Time For Goal Achievement: 10/27/16  Potential to Achieve Goals: Fair    Frequency Min 2X/week   Barriers to discharge        Co-evaluation               End of Session   Activity Tolerance: Patient limited by fatigue Patient left: in chair;with call bell/phone within reach;with chair alarm set Nurse Communication: Mobility status         Time: [id number] PT Time Calculation (min) (ACUTE ONLY): 19 min   Charges:   PT Evaluation $PT Eval Moderate Complexity: 1 Procedure     PT G CodesEliseo Gum:        Floris Neuhaus V Maedell Hedger 10/20/2016, 11:43 AM 10/20/2016  Ebro BingKen Oluwatoyin Banales, PT [phone]  (pager)

## 2016-10-20 NOTE — Patient Outreach (Signed)
[facility] Care Management  10/20/2016  Marsh DollyJeanne R Natt 04/30/1928 [id number]   CSW received a voicemail message from patient's daughter, [name] indicating that CSW needed to cancel her scheduled visit with patient for tomorrow, Friday, January 5th, [facility] where patient currently resides to receive short-term rehabilitative services, due to patient being hospitalized.  CSW has rescheduled the visit for Friday, January 12th. Danford BadJoanna Greyden Besecker, BSW, MSW, LCSW  Licensed Restaurant manager, fast foodClinical Social Worker  [facility] Care Management [place] [facility]  M[address] [place], [place], [place] [address] Physical Address-[address], [place], [place] [address] Toll Free Main # [phone] Fax # [phone] Cell # [phone]  Fax # [phone]  [email or web address]

## 2016-10-20 NOTE — Consult Note (Signed)
   [facility]   10/20/2016  Marsh DollyJeanne R Birkland 08/01/1928 [id number]    Went to bedside again. Spoke with Ms. Hagadorn at bedside to discuss [facility]. [facility] LCSW. Discussed her discharge plan. She states " I am going home". Writer asked her that it was thought she was returning to [place]. Ms. [name] then states " I am returning to [place]". [facility]". She states they have good therapy and I am ready to go back.   Made inpatient RNCM and inpatient LCSW aware of above. Will update [facility] RN[facility] as well.    Raiford NobleAtika Hall, MSN-Ed, RN,BSN [facility] Care [facility] Hospital Liaison [phone]

## 2016-10-20 NOTE — Consult Note (Signed)
   [facility]   10/20/2016  Marsh DollyJeanne R Mcfarren [birth year] [id number]   [facility]. Went to bedside to speak with Ms. [name]. However, nursing was at bedside. Will come back later. Inpatient RNCM aware [facility] following.  Raiford NobleAtika Hall, MSN-Ed, RN,BSN [facility] Care Management Hospital Liaison [phone]

## 2016-10-20 NOTE — Progress Notes (Signed)
Pharmacy Antibiotic Note  Marsh DollyJeanne R Finken is a 83 y.o. female admitted on 10/18/2016 with pneumonia.  Pharmacy has been consulted for Vancomycin / Zosyn dosing.  Plan: Vancomycin 1 gram iv Q 24 hours Zosyn 3.375 grams iv Q 8 hours Follow up progress, Scr, cultures  Height: 5\' 3"  (160 cm) Weight: 152 lb (68.9 kg) IBW/kg (Calculated) : 52.4  Temp (24hrs), Avg:98 F (36.7 C), Min:97.7 F (36.5 C), Max:98.5 F (36.9 C)   Recent Labs Lab 10/18/16 1959 10/19/16 0455 10/20/16 0516  WBC 11.6* 11.2* 11.7*  CREATININE 1.25* 1.05* 1.07*    Estimated Creatinine Clearance: 35.2 mL/min (by C-G formula based on SCr of 1.07 mg/dL (H)).    Allergies  Allergen Reactions  . Tylenol [Acetaminophen] Other (See Comments)    Reaction:  Shaking      Thank you for allowing pharmacy to be a part of this patient's care. Okey RegalLisa Myrical Andujo, PharmD [phone] 10/20/2016 11:04 AM

## 2016-10-20 NOTE — Progress Notes (Signed)
Subjective: No complaints.  Objective: Vital signs in last 24 hours: Temp:  [97.7 F (36.5 C)-98.5 F (36.9 C)] 98 F (36.7 C) (01/04 1721) Pulse Rate:  [82-94] 88 (01/04 1721) Resp:  [18-22] 18 (01/04 1721) BP: (117-125)/(75-87) 122/75 (01/04 1721) SpO2:  [80 %-96 %] 96 % (01/04 1721) FiO2 (%):  [50 %] 50 % (01/04 0749) Weight:  [68.9 kg (152 lb)] 68.9 kg (152 lb) (01/03 2124) Last BM Date: 10/18/16  Intake/Output from previous day: 01/03 0701 - 01/04 0700 In: 840 [P.O.:840] Out: 0  Intake/Output this shift: Total I/O In: 730 [P.O.:480; IV Piggyback:250] Out: 200 [Urine:200]  General appearance: sleeping, but arousable. GI: soft, non-tender; bowel sounds normal; no masses,  no organomegaly  Lab Results:  Recent Labs  10/18/16 1959 10/19/16 0455 10/19/16 1044 10/20/16 0516  WBC 11.6* 11.2*  --  11.7*  HGB 6.0* 7.7* 8.7* 8.8*  HCT 19.3* 23.7* 27.2* 27.4*  PLT 335 275  --  290   BMET  Recent Labs  10/18/16 1959 10/19/16 0455 10/20/16 0516  NA 137 137 137  K 4.4 4.0 3.9  CL 106 105 102  CO2 24 27 28   GLUCOSE 117* 87 84  BUN 30* 26* 21*  CREATININE 1.25* 1.05* 1.07*  CALCIUM 8.1* 7.9* 8.3*   LFT  Recent Labs  10/18/16 1959  PROT 6.1*  ALBUMIN 2.5*  AST 18  ALT 13*  ALKPHOS 55  BILITOT 0.3   PT/INR  Recent Labs  10/18/16 2120  LABPROT 22.2*  INR 1.91   Hepatitis Panel No results for input(s): HEPBSAG, HCVAB, HEPAIGM, HEPBIGM in the last 72 hours. C-Diff No results for input(s): CDIFFTOX in the last 72 hours. Fecal Lactopherrin No results for input(s): FECLLACTOFRN in the last 72 hours.  Studies/Results: Dg Chest Port 1 View  Result Date: 10/20/2016 CLINICAL DATA:  Shortness of breath. EXAM: PORTABLE CHEST 1 VIEW COMPARISON:  10/18/2016.  10/10/2016 . FINDINGS: Mediastinum hilar structures normal. Heart size normal. Low lung volumes with basilar atelectasis . Right base infiltrate suggesting pneumonia. No acute bony abnormality  identified. IMPRESSION: Right base infiltrate noted consistent pneumonia. Low lung volumes with mild bibasilar atelectasis. Electronically Signed   By: Maisie Fushomas  Register   On: 10/20/2016 11:00   Dg Chest Port 1 View  Result Date: 10/18/2016 CLINICAL DATA:  Dyspnea EXAM: PORTABLE CHEST 1 VIEW COMPARISON:  10/10/2016 CXR FINDINGS: The heart size and mediastinal contours are within normal limits. Aortic atherosclerosis is noted. Patchy airspace disease at the right lung base suspicious for pneumonia. Subsegmental can platelike atelectasis at the left lung base. Osteoarthritis of the glenohumeral joints. IMPRESSION: Patchy new airspace opacity at the right lung base suspicious for pneumonia. Left lower lobe atelectasis. Electronically Signed   By: Tollie Ethavid  Kwon M.D.   On: 10/18/2016 23:58    Medications:  Scheduled: . atorvastatin  20 mg Oral q1800  . cilostazol  100 mg Oral BID  . furosemide  40 mg Intravenous BID  . ipratropium-albuterol  3 mL Nebulization Q6H  . methylPREDNISolone (SOLU-MEDROL) injection  60 mg Intravenous Q12H  .  morphine injection  2 mg Intravenous Once  . pantoprazole  40 mg Oral BID  . piperacillin-tazobactam (ZOSYN)  IV  3.375 g Intravenous Q8H  . potassium chloride  20 mEq Oral Daily  . vancomycin  1,000 mg Intravenous Q24H   Continuous: . sodium chloride      Assessment/Plan: 1) Anemia - Stable. 2) Respiratory distress. 3) Recent diagnosis of PE.   The patient is  currently saturating at 92% on 4-4.5 Liters [place].  She cannot undergo an EGD safely.  Her HGB is stable, but with the worsening SOB and the history of the PE off of anticoagulation, a new PE can be a possibility.  Currently is it felt to be more of an cardiac issue.  If her breathing does not improve, I think it is prudent, even in the face of the GI bleed, to restart some type of anticoagulation.  The risk benefit ratio favors restarting anticoagulation.  Plan: 1) I will reassess tomorrow.  LOS: 2 days    Camdyn Laden D 10/20/2016, 5:47 PM

## 2016-10-20 NOTE — Progress Notes (Signed)
Only give information to persons with password per daughter Pattricia Bossnnie.  Password is [id number].

## 2016-10-20 NOTE — Progress Notes (Signed)
PT Cancellation Note  Patient Details Name: Carrie Pena MRN: [id number] DOB: 1927/11/07   Cancelled Treatment:    Reason Eval/Treat Not Completed: Medical issues which prohibited therapy.  Pt went into respiratory distress overnight.  Now on ventimask sats at 90-92%.  Will hold for now and see as able today or 1/5 as able. 10/20/2016  Charlack BingKen Joeangel Jeanpaul, PT [phone]  (pager)  Eliseo GumKenneth V Sherill Wegener 10/20/2016, 10:04 AM

## 2016-10-20 NOTE — Plan of Care (Signed)
Pt c/o SOB, O2 sats 80% on RA.  PRN neb treatment given and pt placed on venturi mask @ 50%, sats improved to 90-92%.

## 2016-10-21 ENCOUNTER — Encounter (HOSPITAL_COMMUNITY)
Admission: EM | Disposition: A | Discharge status: Skilled Nursing Facility | Payer: Self-pay | Source: Home / Self Care | Attending: Internal Medicine

## 2016-10-21 ENCOUNTER — Ambulatory Visit: Payer: Self-pay | Admitting: *Deleted

## 2016-10-21 LAB — CBC
HCT: 28.2 % — ABNORMAL LOW (ref 36.0–46.0)
Hemoglobin: 8.9 g/dL — ABNORMAL LOW (ref 12.0–15.0)
MCH: 30.8 pg (ref 26.0–34.0)
MCHC: 31.6 g/dL (ref 30.0–36.0)
MCV: 97.6 fL (ref 78.0–100.0)
PLATELETS: 278 10*3/uL (ref 150–400)
RBC: 2.89 MIL/uL — ABNORMAL LOW (ref 3.87–5.11)
RDW: 20.6 % — ABNORMAL HIGH (ref 11.5–15.5)
WBC: 8.6 10*3/uL (ref 4.0–10.5)

## 2016-10-21 LAB — BASIC METABOLIC PANEL
ANION GAP: 9 (ref 5–15)
BUN: 32 mg/dL — ABNORMAL HIGH (ref 6–20)
CALCIUM: 7.8 mg/dL — AB (ref 8.9–10.3)
CO2: 25 mmol/L (ref 22–32)
CREATININE: 1.34 mg/dL — AB (ref 0.44–1.00)
Chloride: 101 mmol/L (ref 101–111)
GFR, EST AFRICAN AMERICAN: 40 mL/min — AB (ref >60)
GFR, EST NON AFRICAN AMERICAN: 35 mL/min — AB (ref >60)
GLUCOSE: 149 mg/dL — AB (ref 65–99)
Potassium: 4.2 mmol/L (ref 3.5–5.1)
Sodium: 135 mmol/L (ref 135–145)

## 2016-10-21 LAB — BRAIN NATRIURETIC PEPTIDE: B Natriuretic Peptide: 54.1 pg/mL (ref 0.0–100.0)

## 2016-10-21 LAB — APTT: APTT: 31 s (ref 24–36)

## 2016-10-21 LAB — HEPARIN LEVEL (UNFRACTIONATED): Heparin Unfractionated: 0.61 IU/mL (ref 0.30–0.70)

## 2016-10-21 SURGERY — EGD (ESOPHAGOGASTRODUODENOSCOPY)
Anesthesia: Moderate Sedation

## 2016-10-21 MED ORDER — FUROSEMIDE 10 MG/ML IJ SOLN
40.0000 mg | Freq: Every day | INTRAMUSCULAR | Status: DC
Start: 2016-10-22 — End: 2016-10-22
  Administered 2016-10-22: 40 mg via INTRAVENOUS
  Filled 2016-10-21: qty 4

## 2016-10-21 MED ORDER — HEPARIN (PORCINE) IN NACL 100-0.45 UNIT/ML-% IJ SOLN
1150.0000 [IU]/h | INTRAMUSCULAR | Status: DC
  Administered 2016-10-21: 1000 [IU]/h via INTRAVENOUS
  Administered 2016-10-22: 1150 [IU]/h via INTRAVENOUS
  Filled 2016-10-21 (×2): qty 250

## 2016-10-21 MED ORDER — IPRATROPIUM-ALBUTEROL 0.5-2.5 (3) MG/3ML IN SOLN
3.0000 mL | Freq: Three times a day (TID) | RESPIRATORY_TRACT | Status: DC
  Administered 2016-10-21 – 2016-10-23 (×6): 3 mL via RESPIRATORY_TRACT
  Filled 2016-10-21 (×7): qty 3

## 2016-10-21 MED ORDER — VANCOMYCIN HCL IN DEXTROSE 750-5 MG/150ML-% IV SOLN
750.0000 mg | INTRAVENOUS | Status: DC
  Administered 2016-10-21: 750 mg via INTRAVENOUS
  Filled 2016-10-21 (×2): qty 150

## 2016-10-21 MED ORDER — WHITE PETROLATUM GEL
Status: AC
  Administered 2016-10-21: 16:00:00
  Filled 2016-10-21: qty 1

## 2016-10-21 NOTE — Care Management Important Message (Signed)
Important Message  Patient Details  Name: Carrie Pena R Shinn MRN: [id number] Date of Birth: [birth year]   Medicare Important Message Given:  Yes    Dorena BodoIris Kit Brubacher 10/21/2016, 1:47 PM

## 2016-10-21 NOTE — Progress Notes (Signed)
Pharmacy Antibiotic Note  Carrie Pena is a 83 y.o. female admitted on 10/18/2016 with pneumonia.  Pharmacy has been consulted for Vancomycin / Zosyn dosing.  Scr up to 1.34 this AM Afebrile  Plan: Decrease Vancomycin to 750 mg iv Q 24 hours Zosyn 3.375 grams iv Q 8 hours Follow up progress, Scr, cultures  Height: 5\' 3"  (160 cm) Weight: 152 lb (68.9 kg) IBW/kg (Calculated) : 52.4  Temp (24hrs), Avg:98.2 F (36.8 C), Min:97.7 F (36.5 C), Max:98.7 F (37.1 C)   Recent Labs Lab 10/18/16 1959 10/19/16 0455 10/20/16 0516 10/21/16 0549  WBC 11.6* 11.2* 11.7* 8.6  CREATININE 1.25* 1.05* 1.07* 1.34*    Estimated Creatinine Clearance: 28.1 mL/min (by C-G formula based on SCr of 1.34 mg/dL (H)).    Allergies  Allergen Reactions  . Tylenol [Acetaminophen] Other (See Comments)    Reaction:  Shaking      Thank you  Okey RegalLisa Bethannie Iglehart, PharmD [phone] 10/21/2016 10:53 AM

## 2016-10-21 NOTE — Progress Notes (Addendum)
PROGRESS NOTE        PATIENT DETAILS Name: Carrie Pena Age: 83 y.o. Sex: female Date of Birth: 31-May-1928 Admit Date: 10/18/2016 Admitting Physician Clydie Braun, MD ZOX:[id number],WJXBJ, MD  Brief Narrative: Patient is a 83 y.o female with history of COPD-recently placed on home O2 (2 L/m)- recent diagnosis of a pulmonary embolism (10/03/16) on Xarelto admitted for evaluation of acute on chronic anemia. Transfused 2 units of PRBC. GI consulted-plans are for EGD-however hospital course complicated by worsening hypoxemia likely due to HCAP/COPD exacerbation. See below for details.   Subjective: Feels better-oxygen requirements slowly coming down-down to 3 L this morning-O2 saturation around 89-92%. + Cough  Assessment/Plan: Principal Problem:  Likely acute blood loss anemia on top of anemia of chronic disease:Acute blood loss likely due to occult GI bleed-although had some epistaxis, Hb drop seems disproportionate to the amount of epistaxis she had.  Hb stable at 8.9-after 2 units of PRBC. Follow CBC  ?GI Bleed: likely causing above-was on Xarelto for PE-no overt bleeding-but FOBT +-GI planning EGD. Continue PPI   Acute on chronic respiratory failure with hypoxia: Developed worsening hypoxia on 1/4 (recently discharge to SNF on 2 L of O2). CXR confirms RLL PNA. Suspect etiology of hypoxia to be from PNA,?CHF (leg edema) and underlying COPD with mild exacerbation (wheezing).Recent PE was very small-and likely not playing a role in hypoxia-furthermore lower ext dopplers neg. She was subsequently started on empiric antibiotics, diuresis and given steroids and bronchodilators. Given severity of anemia, anticoagulation was not resumed. She is clinically improved today, she is much more comfortable, oxygen requirements have gone down to around 3-4 L (approximately 6 L on 1/4). Family made aware of the limitations of care given advanced age.  Addendum 5:45 pm: Per  RN-EGD cancelled-since hypoxia continues-although improved-since no active ongoing bleeding-we will start Heparin without bolus and will ask pharmacy to maintain in lower therapeutic range. If no bleeding evident and CBC is stable while on heparin-we can then contemplate switching back to oral anticoagulation on discharge. If bleeds-then we can consider palliative measures.  HCAP: Approved-decreased oxygen requirements this morning. Blood cultures on 1/4 pending. Continue empiric vancomycin and Zosyn.   COPD exacerbation: Significantly less wheezing this morning-start tapering steroids, continue bronchodilators.   Recent diagnosis of pulmonary embolism: Very small pulmonary embolism on CT angiogram done on 12/18-lower extremity Dopplers on 10/04/16, and on 10/19/16 negative for DVT-given advanced age, severity of anemia-possible acute blood loss-poor long-term candidate for anticoagulation-holding anticoagulation till endoscopic evaluation is complete.  Lower extremity edema: Appears chronic-likely secondary to hypoalbuminemia, amlodipine use, although has grade 1 diastolic dysfunction on recent echocardiogram-but does not have any other signs of CHF-but with worsening hypoxia  Lasix was changed BID dosing. Significant improvement in edema with Lasix, given slight AKI-change Lasix to daily dosing. Follow.   ?Acute on Chronic diastolic heart failure: See above.  Mild acute kidney injury: Probably due to diuresis-change Lasix to daily dosing-follow.  History of peripheral vascular disease: Continue cilostazol-if endoscopy reveals major pathology-and not able to restart anticoagulation-will need to be restarted on aspirin.  Dyslipidemia: Continue statin   GERD: Continue PPI  Hypertension: Blood pressure currently controlled-avoid amlodipine on discharge given significant lower extremity edema-will start alternative antihypertensive agent when able  Tobacco Abuse:long standing  history-counseled  Ethics/palliative care: DO NOT RESUSCITATE in place. Spoke to a granddaughter at [employer]  on 1/5, explained numerous medical comorbidities, advanced age and frailty-presumed acute blood loss anemia due to probable GI bleeding-contraindications to anticoagulation. Furthermore with her frailty/advanced age-risk of falls she is not a good long-term anticoagulation candidate. Explained that we will await GI evaluation/endoscopic evaluation-but at some point may need a palliative care consult. Family aware of poor overall long-term prognosis.  DVT Prophylaxis: Compression boot therapy. No longer taking Xeralto due to possible occult bleed.   Code Status: DNR-Confirmed with grand daughter over the phone  Family Communication: Spoke with grand daughter (HPOA) over the phone today as well  Disposition Plan: Remain inpatient-back to SNF sometime next week  Antimicrobial agents: Vancomycin 1/4>> Zosyn 1/4>>  Procedures: None  CONSULTS: GI  Time spent: 25 minutes-Greater than 50% of this time was spent in counseling, explanation of diagnosis, planning of further management, and coordination of care.  MEDICATIONS: Scheduled Meds: . atorvastatin  20 mg Oral q1800  . cilostazol  100 mg Oral BID  . furosemide  40 mg Intravenous BID  . ipratropium-albuterol  3 mL Nebulization Q6H  . methylPREDNISolone (SOLU-MEDROL) injection  60 mg Intravenous Q12H  .  morphine injection  2 mg Intravenous Once  . pantoprazole  40 mg Oral BID  . potassium chloride  20 mEq Oral Daily      Continuous Infusions: . sodium chloride     PRN Meds:.albuterol, ALPRAZolam, ondansetron **OR** ondansetron (ZOFRAN) IV, sodium chloride (OCEAN) nasal spray    PHYSICAL EXAM: Vital signs: Vitals:   10/20/16 2111 10/21/16 0501 10/21/16 0913 10/21/16 0954  BP: (!) 103/53 115/87 (!) 100/57   Pulse: 85 77 87 88  Resp: 18 19 18 16   Temp: 98.2 F (36.8 C) 98.7 F (37.1 C) 97.7 F (36.5 C)   TempSrc:  Oral Oral Oral   SpO2: 94% 98% 93% 92%  Weight:      Height:       Filed Weights   10/18/16 1736 10/18/16 2320 10/19/16 2124  Weight: 68 kg (150 lb) 70.1 kg (154 lb 9.6 oz) 68.9 kg (152 lb)   Body mass index is 26.93 kg/m.   General appearance :Awake, alert, not in any distress. Speech Clear. Not toxic Looking Resp:Good air entry bilaterally, Few scattered wheezing CVS: S1 S2 regular, no murmurs.  GI: Bowel sounds present, Non tender and not distended with no gaurding, rigidity or rebound.No organomegaly Extremities: Both legs are warm to touch, bilateral lower extremity with trace edema present Neurology:  speech clear,Non focal, sensation is grossly intact. Psychiatric: Normal judgment and insight. Alert and oriented x 3. Normal mood. Musculoskeletal:No digital cyanosis Skin:No Rash, warm and dry  I have personally reviewed following labs and imaging studies  LABORATORY DATA: CBC:  Recent Labs Lab 10/18/16 1959 10/19/16 0455 10/19/16 1044 10/20/16 0516 10/21/16 0549  WBC 11.6* 11.2*  --  11.7* 8.6  HGB 6.0* 7.7* 8.7* 8.8* 8.9*  HCT 19.3* 23.7* 27.2* 27.4* 28.2*  MCV 106.0* 94.0  --  94.8 97.6  PLT 335 275  --  290 278  Slight leukocytosis noted, along with low Hgb and Hct. Encouraged Hgb is trending up. Endoscopy will help r/o occult blood loss. CXR will help r/o presence of pneumonia.   Basic Metabolic Panel:  Recent Labs Lab 10/18/16 1959 10/19/16 0455 10/20/16 0516 10/21/16 0549  NA 137 137 137 135  K 4.4 4.0 3.9 4.2  CL 106 105 102 101  CO2 24 27 28 25   GLUCOSE 117* 87 84 149*  BUN 30* 26* 21* 32*  CREATININE 1.25* 1.05* 1.07* 1.34*  CALCIUM 8.1* 7.9* 8.3* 7.8*    GFR: Estimated Creatinine Clearance: 28.1 mL/min (by C-G formula based on SCr of 1.34 mg/dL (H)).  Liver Function Tests:  Recent Labs Lab 10/18/16 1959  AST 18  ALT 13*  ALKPHOS 55  BILITOT 0.3  PROT 6.1*  ALBUMIN 2.5*   No results for input(s): LIPASE, AMYLASE in the last 168  hours. No results for input(s): AMMONIA in the last 168 hours.  Coagulation Profile:  Recent Labs Lab 10/18/16 2120  INR 1.91    Cardiac Enzymes:  Recent Labs Lab 10/19/16 0455 10/19/16 1044  TROPONINI <0.03 <0.03    BNP (last 3 results) No results for input(s): PROBNP in the last 8760 hours.  HbA1C: No results for input(s): HGBA1C in the last 72 hours.  CBG: No results for input(s): GLUCAP in the last 168 hours.  Lipid Profile: No results for input(s): CHOL, HDL, LDLCALC, TRIG, CHOLHDL, LDLDIRECT in the last 72 hours.  Thyroid Function Tests: No results for input(s): TSH, T4TOTAL, FREET4, T3FREE, THYROIDAB in the last 72 hours.  Anemia Panel:  Recent Labs  10/18/16 2120  VITAMINB12 402  FOLATE 8.9  FERRITIN 77  TIBC 251  IRON 22*  RETICCTPCT 8.5*    Urine analysis:    Component Value Date/Time   COLORURINE YELLOW 09/29/2016 1240   APPEARANCEUR HAZY (A) 09/29/2016 1240   LABSPEC 1.010 09/29/2016 1240   PHURINE 5.0 09/29/2016 1240   GLUCOSEU NEGATIVE 09/29/2016 1240   HGBUR SMALL (A) 09/29/2016 1240   BILIRUBINUR NEGATIVE 09/29/2016 1240   KETONESUR 5 (A) 09/29/2016 1240   PROTEINUR NEGATIVE 09/29/2016 1240   UROBILINOGEN 0.2 11/08/2009 1426   NITRITE NEGATIVE 09/29/2016 1240   LEUKOCYTESUR NEGATIVE 09/29/2016 1240    Sepsis Labs: Lactic Acid, Venous    Component Value Date/Time   LATICACIDVEN 1.38 09/29/2016 1431    MICROBIOLOGY: No results found for this or any previous visit (from the past 240 hour(s)).  RADIOLOGY STUDIES/RESULTS: Dg Chest 2 View  Result Date: 10/11/2016 CLINICAL DATA:  Cough and shortness of breath tonight. Recent diagnosis of pulmonary embolus. EXAM: CHEST  2 VIEW COMPARISON:  Radiograph 03/02/2016, CT 10/03/2016 FINDINGS: Lower lung volumes from prior exam. Mild emphysema. Tortuous thoracic aorta with atherosclerosis. The heart is normal in size. No pulmonary edema. Minimal left infrahilar atelectasis. No confluent  airspace disease, pleural effusion or pneumothorax. Advanced degenerative change of the left shoulder. IMPRESSION: Left lung base atelectasis. Thoracic aortic atherosclerosis. Electronically Signed   By: Rubye Oaks M.D.   On: 10/11/2016 00:08   Dg Chest 2 View  Result Date: 09/29/2016 CLINICAL DATA: Chest pain EXAM: CHEST  2 VIEW COMPARISON:  September 01, 2016 FINDINGS: There is slight scarring in the right base. There is no edema or consolidation. Heart size and pulmonary vascularity are normal. No adenopathy. There is atherosclerotic calcification in the aorta. Aorta is tortuous but stable. There is arthropathy in each shoulder. IMPRESSION: No edema or consolidation. Slight scarring right base. Stable cardiac silhouette. There aorta is tortuous with aortic atherosclerosis. Electronically Signed   By: Bretta Bang III M.D.   On: 09/29/2016 13:30   Ct Angio Chest Pe W And/or Wo Contrast  Result Date: 10/03/2016 CLINICAL DATA:  Increased difficulty breathing.  Short of breath. EXAM: CT ANGIOGRAPHY CHEST WITH CONTRAST TECHNIQUE: Multidetector CT imaging of the chest was performed using the standard protocol during bolus administration of intravenous contrast. Multiplanar CT image reconstructions and MIPs were obtained to evaluate the  vascular anatomy. CONTRAST:  75 mL Isovue COMPARISON:  Radiograph 10/02/2016 FINDINGS: Cardiovascular: A thin tubular filling defect within the LEFT upper lobe pulmonary artery (image 93, series 7). No additional pulmonary emboli. Overall clot burden is minimal. No RIGHT ventricular strain present. No pericardial fluid. No acute findings aorta great vessels. Mediastinum/Nodes: No axillary or supraclavicular lymphadenopathy. No mediastinal hilar adenopathy. No pericardial fluid. Lungs/Pleura: Centrilobular emphysema in the upper lobes. No pulmonary infarction. No airspace disease. No pleural fluid or pneumothorax Within the LEFT lower lobe 5 mm pulmonary nodule (image  32, series 8). Upper Abdomen: Limited view of the liver, kidneys, pancreas are unremarkable. Normal adrenal glands. Musculoskeletal: No aggressive osseous lesion. Review of the MIP images confirms the above findings. IMPRESSION: 1. Small acute pulmonary embolism within the LEFT upper lobe pulmonary artery. Overall clot burden is minimal. No RIGHT ventricular strain evident. 2. Coronary artery calcification and aortic atherosclerotic calcification. 3. **An incidental finding of potential clinical significance has been found. ** Small LEFT lower lobe pulmonary nodule. No follow-up needed if patient is low-risk. Non-contrast chest CT can be considered in 12 months if patient is high-risk. This recommendation follows the consensus statement: Guidelines for Management of Incidental Pulmonary Nodules Detected on CT Images: From the Fleischner Society 2017; Radiology 2017; 284:228-243. Critical Value/emergent results were called by telephone at the time of interpretation on 10/03/2016 at 5:47 pm to Dr. Linwood Dibbles , who verbally acknowledged these results. Electronically Signed   By: Genevive Bi M.D.   On: 10/03/2016 17:49   Ct Abdomen Pelvis W Contrast  Result Date: 09/30/2016 CLINICAL DATA:  History of iliopsoas hematoma, recent falls with left-sided pain, initial encounter EXAM: CT ABDOMEN AND PELVIS WITH CONTRAST TECHNIQUE: Multidetector CT imaging of the abdomen and pelvis was performed using the standard protocol following bolus administration of intravenous contrast. CONTRAST:  75 mL Isovue-300 COMPARISON:  09/01/2016. FINDINGS: Lower chest: Bilateral lower lobe atelectasis. No sizable effusion is seen. Small hiatal hernia is noted. Hepatobiliary: No focal liver abnormality is seen. No gallstones, gallbladder wall thickening, or biliary dilatation. Pancreas: Unremarkable. No pancreatic ductal dilatation or surrounding inflammatory changes. Spleen: Multiple calcified granulomas are noted. The spleen is  otherwise within normal limits. Adrenals/Urinary Tract: Renal vascular calcifications are noted. No calculi are seen. No obstructive changes are noted. The adrenal glands show mild fullness on the left likely related to hyperplasia. Stomach/Bowel: Diffuse diverticular change of the colon is noted. The appendix is within normal limits. No inflammatory changes are noted. Vascular/Lymphatic: Diffuse aortic calcifications are noted. There are changes consistent within infrarenal aortic aneurysm. It measures 6.8 x 6.4 cm in greatest AP and transverse dimensions respectively. Significant mural thrombus is identified no extravasation is identified. The aneurysm extends to the aortic bifurcation. Diffuse calcifications are noted throughout the iliac vessels without aneurysmal dilatation. Reproductive: Uterus and bilateral adnexa are unremarkable. Other: Left inguinal hernia is noted with multiple loops of small bowel within. No obstructive changes are seen. This is stable from the prior exam. Musculoskeletal: Degenerative changes of lumbar spine are seen. IMPRESSION: Mild bibasilar atelectatic changes without pleural effusion. Abdominal aortic aneurysm as described. Vascular surgery consultation recommended due to increased risk of rupture for AAA >5.5 cm. This recommendation follows ACR consensus guidelines: White Paper of the ACR Incidental Findings Committee II on Vascular Findings. J Am Coll Radiol 2013; 10:789-794. Diverticulosis without diverticulitis. Left inguinal hernia containing small bowel loops without incarceration. Electronically Signed   By: Alcide Clever M.D.   On: 09/30/2016 15:56   Dg  Chest Port 1 View  Result Date: 10/20/2016 CLINICAL DATA:  Shortness of breath. EXAM: PORTABLE CHEST 1 VIEW COMPARISON:  10/18/2016.  10/10/2016 . FINDINGS: Mediastinum hilar structures normal. Heart size normal. Low lung volumes with basilar atelectasis . Right base infiltrate suggesting pneumonia. No acute bony  abnormality identified. IMPRESSION: Right base infiltrate noted consistent pneumonia. Low lung volumes with mild bibasilar atelectasis. Electronically Signed   By: Maisie Fushomas  Register   On: 10/20/2016 11:00   Dg Chest Port 1 View  Result Date: 10/18/2016 CLINICAL DATA:  Dyspnea EXAM: PORTABLE CHEST 1 VIEW COMPARISON:  10/10/2016 CXR FINDINGS: The heart size and mediastinal contours are within normal limits. Aortic atherosclerosis is noted. Patchy airspace disease at the right lung base suspicious for pneumonia. Subsegmental can platelike atelectasis at the left lung base. Osteoarthritis of the glenohumeral joints. IMPRESSION: Patchy new airspace opacity at the right lung base suspicious for pneumonia. Left lower lobe atelectasis. Electronically Signed   By: Tollie Ethavid  Kwon M.D.   On: 10/18/2016 23:58   Dg Chest Port 1 View  Result Date: 10/02/2016 CLINICAL DATA:  Shortness of breath. EXAM: PORTABLE CHEST 1 VIEW COMPARISON:  09/29/2016 FINDINGS: Lungs are hyperinflated. There is mild perihilar peribronchial thickening. No focal consolidations or pleural effusions are identified. Chronic changes are identified in the left shoulder. IMPRESSION: Hyperinflation and bronchitic changes. No focal acute pulmonary abnormality. Electronically Signed   By: Norva PavlovElizabeth  Brown M.D.   On: 10/02/2016 16:23     LOS: 3 days   Atom Solivan,     If 7PM-7AM, please contact night-coverage [email or web address] Password [email or web address] 10/21/2016, 11:27 AM

## 2016-10-21 NOTE — Progress Notes (Addendum)
ANTICOAGULATION CONSULT NOTE - Initial Consult  Pharmacy Consult for heparin Indication: pulmonary embolus  Allergies  Allergen Reactions  . Tylenol [Acetaminophen] Other (See Comments)    Reaction:  Shaking     Patient Measurements: Height: 5\' 3"  (160 cm) Weight: 152 lb (68.9 kg) IBW/kg (Calculated) : 52.4 Heparin Dosing Weight: 68 Kg  Vital Signs: Temp: 97.8 F (36.6 C) (01/05 1835) Temp Source: Oral (01/05 1835) BP: 101/54 (01/05 1835) Pulse Rate: 94 (01/05 1835)  Labs:  Recent Labs  10/18/16 2120 10/19/16 0455 10/19/16 1044 10/20/16 0516 10/21/16 0549  HGB  --  7.7* 8.7* 8.8* 8.9*  HCT  --  23.7* 27.2* 27.4* 28.2*  PLT  --  275  --  290 278  LABPROT 22.2*  --   --   --   --   INR 1.91  --   --   --   --   CREATININE  --  1.05*  --  1.07* 1.34*  TROPONINI  --  <0.03 <0.03  --   --    Estimated Creatinine Clearance: 28.1 mL/min (by C-G formula based on SCr of 1.34 mg/dL (H)).  Medical History: Past Medical History:  Diagnosis Date  . Carpal tunnel syndrome   . COPD (chronic obstructive pulmonary disease) (HCC)   . GERD (gastroesophageal reflux disease)   . Hyperlipidemia   . Hypertension   . Osteoarthritis    Medications:  Prescriptions Prior to Admission  Medication Sig Dispense Refill Last Dose  . alendronate (FOSAMAX) 70 MG tablet Take 70 mg by mouth every Thursday. Take with a full glass of water on an empty stomach.    10/13/2016  . ALPRAZolam (XANAX) 0.25 MG tablet Take 0.25 mg by mouth every 8 (eight) hours as needed for anxiety.   10/18/2016 at Unknown time  . amLODipine (NORVASC) 10 MG tablet Take 1 tablet (10 mg total) by mouth daily. 30 tablet 0 10/18/2016 at Unknown time  . aspirin EC 81 MG tablet Take 81 mg by mouth daily.   10/18/2016 at Unknown time  . atorvastatin (LIPITOR) 20 MG tablet Take 20 mg by mouth daily at 6 PM.   10/17/2016 at Unknown time  . budesonide-formoterol (SYMBICORT) 160-4.5 MCG/ACT inhaler Inhale 2 puffs into the lungs 2 (two)  times daily.   10/18/2016 at Unknown time  . cilostazol (PLETAL) 100 MG tablet Take 100 mg by mouth 2 (two) times daily.   10/18/2016 at 0900  . ipratropium-albuterol (DUONEB) 0.5-2.5 (3) MG/3ML SOLN Take 3 mLs by nebulization every 4 (four) hours as needed (for wheezing/shortness of breath).   10/18/2016 at Unknown time  . Menthol (ICY HOT) 5 % PTCH Apply 1 application topically daily. Pt applies to left upper arm every morning and removes at bedtime.   10/18/2016 at Unknown time  . omeprazole (PRILOSEC) 20 MG capsule Take 1 capsule (20 mg total) by mouth daily. 30 capsule 0 10/18/2016 at Unknown time  . OVER THE COUNTER MEDICATION Take 120 mLs by mouth 2 (two) times daily. Med pass   10/18/2016 at Unknown time  . oxycodone (OXY-IR) 5 MG capsule Take 5 mg by mouth every 6 (six) hours as needed for pain.   10/18/2016 at Unknown time  . OXYGEN Inhale 2 L into the lungs daily as needed (for low O2 sats).   10/18/2016 at Unknown time  . polyethylene glycol (MIRALAX / GLYCOLAX) packet Take 17 g by mouth 2 (two) times daily.   10/18/2016 at Unknown time  . predniSONE (DELTASONE)  20 MG tablet Take 2 tablets (40 mg total) by mouth daily. 10 tablet 0 10/18/2016 at 0900  . Protein (PROCEL) POWD Take 2 scoop by mouth 2 (two) times daily.   10/18/2016 at Unknown time  . Rivaroxaban (XARELTO) 15 MG TABS tablet Take 1 tablet (15 mg total) by mouth 2 (two) times daily with a meal. 42 tablet 0 10/18/2016 at 0900  . senna-docusate (SENOKOT-S) 8.6-50 MG tablet Take 2 tablets by mouth 2 (two) times daily.   10/18/2016 at Unknown time   Assessment: Patient is a 57 y.ofemalewith recent diagnosis of apulmonary embolism (10/03/16) on Xarelto admitted for evaluation of acute on chronic anemia. Transfused 2 units of PRBC. GI consulted,  EGD deferred 2/2  Poor pulmonary status. Last dose of Xarelto 10/19/16 @ 0741. Baseline labs have been ordered. Will avoid heparin bolus and monitor CBC closely.  Baseline heparin level 0.61, aPTT 31  Goal of  Therapy:  Heparin level 0.3-0.5 units/ml aPTT 66-102 seconds Monitor platelets by anticoagulation protocol: Yes   Plan:  Start heparin infusion at 1000 units/hr Check anti-Xa level in 8 hours and daily while on heparin Continue to monitor H&H and platelets  Lautaro Koral L Kerisha Goughnour 10/21/2016,6:53 PM

## 2016-10-21 NOTE — Progress Notes (Signed)
Subjective: No complaints.  She wants to go home.  Objective: Vital signs in last 24 hours: Temp:  [97.7 F (36.5 C)-98.7 F (37.1 C)] 97.8 F (36.6 C) (01/05 1835) Pulse Rate:  [77-95] 94 (01/05 1835) Resp:  [16-19] 18 (01/05 1835) BP: (100-115)/(53-87) 101/54 (01/05 1835) SpO2:  [88 %-98 %] 91 % (01/05 1835) Last BM Date: 10/18/16  Intake/Output from previous day: 01/04 0701 - 01/05 0700 In: 850 [P.O.:600; IV Piggyback:250] Out: 200 [Urine:200] Intake/Output this shift: Total I/O In: 290 [P.O.:290] Out: -   General appearance: alert and no distress GI: soft, non-tender; bowel sounds normal; no masses,  no organomegaly  Lab Results:  Recent Labs  10/19/16 0455 10/19/16 1044 10/20/16 0516 10/21/16 0549  WBC 11.2*  --  11.7* 8.6  HGB 7.7* 8.7* 8.8* 8.9*  HCT 23.7* 27.2* 27.4* 28.2*  PLT 275  --  290 278   BMET  Recent Labs  10/19/16 0455 10/20/16 0516 10/21/16 0549  NA 137 137 135  K 4.0 3.9 4.2  CL 105 102 101  CO2 27 28 25   GLUCOSE 87 84 149*  BUN 26* 21* 32*  CREATININE 1.05* 1.07* 1.34*  CALCIUM 7.9* 8.3* 7.8*   LFT  Recent Labs  10/18/16 1959  PROT 6.1*  ALBUMIN 2.5*  AST 18  ALT 13*  ALKPHOS 55  BILITOT 0.3   PT/INR  Recent Labs  10/18/16 2120  LABPROT 22.2*  INR 1.91   Hepatitis Panel No results for input(s): HEPBSAG, HCVAB, HEPAIGM, HEPBIGM in the last 72 hours. C-Diff No results for input(s): CDIFFTOX in the last 72 hours. Fecal Lactopherrin No results for input(s): FECLLACTOFRN in the last 72 hours.  Studies/Results: Dg Chest Port 1 View  Result Date: 10/20/2016 CLINICAL DATA:  Shortness of breath. EXAM: PORTABLE CHEST 1 VIEW COMPARISON:  10/18/2016.  10/10/2016 . FINDINGS: Mediastinum hilar structures normal. Heart size normal. Low lung volumes with basilar atelectasis . Right base infiltrate suggesting pneumonia. No acute bony abnormality identified. IMPRESSION: Right base infiltrate noted consistent pneumonia. Low lung  volumes with mild bibasilar atelectasis. Electronically Signed   By: Maisie Fushomas  Register   On: 10/20/2016 11:00    Medications:  Scheduled: . atorvastatin  20 mg Oral q1800  . cilostazol  100 mg Oral BID  . [START ON 10/22/2016] furosemide  40 mg Intravenous Daily  . ipratropium-albuterol  3 mL Nebulization TID  . methylPREDNISolone (SOLU-MEDROL) injection  60 mg Intravenous Q12H  .  morphine injection  2 mg Intravenous Once  . pantoprazole  40 mg Oral BID  . piperacillin-tazobactam (ZOSYN)  IV  3.375 g Intravenous Q8H  . potassium chloride  20 mEq Oral Daily  . vancomycin  750 mg Intravenous Q24H   Continuous: . sodium chloride      Assessment/Plan: 1) Anemia. 2) Respiratory failure. 3) PE.   Her current O2 is at 4.5 liter to 5 liters and she is only saturating at 92%.  There is no discernable improvement to her respiratory status.  Her HGB is stable and I agree that she needs to be started on heparin.  The risk/benefit ratio is in her favor to restart on anticoagulation, even though there is a risk for GI bleed.  Being off of anticoagulation carries a higher mortality risk than restarting the medication.  I will not pursue an EGD with her poor pulmonary status.  If she can improve to >96% with 1-2 liters [place] and EGD can be performed.  Plan: 1) Continue with supportive care. 2)  Agree with heparin. 3) Signing off.  Call if you have any question or there is an improvement in the clinical status.  LOS: 3 days   Carrie Pena D 10/21/2016, 6:41 PM

## 2016-10-22 ENCOUNTER — Encounter (HOSPITAL_COMMUNITY): Payer: Self-pay | Admitting: Internal Medicine

## 2016-10-22 DIAGNOSIS — R710 Precipitous drop in hematocrit: Secondary | ICD-10-CM

## 2016-10-22 LAB — BASIC METABOLIC PANEL
Anion gap: 9 (ref 5–15)
BUN: 37 mg/dL — ABNORMAL HIGH (ref 6–20)
CALCIUM: 7.5 mg/dL — AB (ref 8.9–10.3)
CO2: 26 mmol/L (ref 22–32)
CREATININE: 1.43 mg/dL — AB (ref 0.44–1.00)
Chloride: 102 mmol/L (ref 101–111)
GFR calc Af Amer: 37 mL/min — ABNORMAL LOW (ref >60)
GFR, EST NON AFRICAN AMERICAN: 32 mL/min — AB (ref >60)
GLUCOSE: 148 mg/dL — AB (ref 65–99)
Potassium: 3.7 mmol/L (ref 3.5–5.1)
Sodium: 137 mmol/L (ref 135–145)

## 2016-10-22 LAB — CBC
HEMATOCRIT: 26.8 % — AB (ref 36.0–46.0)
HEMOGLOBIN: 8.5 g/dL — AB (ref 12.0–15.0)
MCH: 30.2 pg (ref 26.0–34.0)
MCHC: 31.7 g/dL (ref 30.0–36.0)
MCV: 95.4 fL (ref 78.0–100.0)
Platelets: 291 10*3/uL (ref 150–400)
RBC: 2.81 MIL/uL — ABNORMAL LOW (ref 3.87–5.11)
RDW: 19.4 % — ABNORMAL HIGH (ref 11.5–15.5)
WBC: 7.6 10*3/uL (ref 4.0–10.5)

## 2016-10-22 LAB — HEPARIN LEVEL (UNFRACTIONATED)
Heparin Unfractionated: 0.76 IU/mL — ABNORMAL HIGH (ref 0.30–0.70)
Heparin Unfractionated: 0.53 IU/mL (ref 0.30–0.70)

## 2016-10-22 LAB — APTT
aPTT: 71 seconds — ABNORMAL HIGH (ref 24–36)
aPTT: 49 seconds — ABNORMAL HIGH (ref 24–36)

## 2016-10-22 MED ORDER — SODIUM CHLORIDE 0.9 % IV SOLN
INTRAVENOUS | Status: AC
  Administered 2016-10-22: 10:00:00 via INTRAVENOUS

## 2016-10-22 MED ORDER — AMOXICILLIN-POT CLAVULANATE 500-125 MG PO TABS
1.0000 | ORAL_TABLET | Freq: Two times a day (BID) | ORAL | Status: DC
  Administered 2016-10-22 – 2016-10-23 (×3): 500 mg via ORAL
  Filled 2016-10-22 (×3): qty 1

## 2016-10-22 MED ORDER — RIVAROXABAN 15 MG PO TABS
15.0000 mg | ORAL_TABLET | Freq: Two times a day (BID) | ORAL | Status: DC
  Administered 2016-10-23: 15 mg via ORAL
  Filled 2016-10-22: qty 1

## 2016-10-22 MED ORDER — PREDNISONE 50 MG PO TABS
50.0000 mg | ORAL_TABLET | Freq: Every day | ORAL | Status: DC
  Administered 2016-10-23: 50 mg via ORAL
  Filled 2016-10-22: qty 1

## 2016-10-22 NOTE — Progress Notes (Signed)
ANTICOAGULATION CONSULT NOTE - Follow-Up Consult  Pharmacy Consult for heparin Indication: pulmonary embolus  Allergies  Allergen Reactions  . Tylenol [Acetaminophen] Other (See Comments)    Reaction:  Shaking     Patient Measurements: Height: 5\' 3"  (160 cm) Weight: 147 lb (66.7 kg) IBW/kg (Calculated) : 52.4 Heparin Dosing Weight: 68 Kg  Vital Signs: Temp: 98.4 F (36.9 C) (01/06 0459) Temp Source: Oral (01/06 0459) BP: 93/50 (01/06 0459) Pulse Rate: 89 (01/06 0459)  Labs:  Recent Labs  10/19/16 1044 10/20/16 0516 10/21/16 0549 10/21/16 1852 10/22/16 0622 10/22/16 0627  HGB 8.7* 8.8* 8.9*  --   --  8.5*  HCT 27.2* 27.4* 28.2*  --   --  26.8*  PLT  --  290 278  --   --  291  APTT  --   --   --  31 71*  --   HEPARINUNFRC  --   --   --  0.61  --  0.76*  CREATININE  --  1.07* 1.34*  --   --  1.43*  TROPONINI <0.03  --   --   --   --   --    Estimated Creatinine Clearance: 25.9 mL/min (by C-G formula based on SCr of 1.43 mg/dL (H)).  Medical History: Past Medical History:  Diagnosis Date  . Carpal tunnel syndrome   . COPD (chronic obstructive pulmonary disease) (HCC)   . GERD (gastroesophageal reflux disease)   . Hyperlipidemia   . Hypertension   . Osteoarthritis    Medications:  Prescriptions Prior to Admission  Medication Sig Dispense Refill Last Dose  . alendronate (FOSAMAX) 70 MG tablet Take 70 mg by mouth every Thursday. Take with a full glass of water on an empty stomach.    10/13/2016  . ALPRAZolam (XANAX) 0.25 MG tablet Take 0.25 mg by mouth every 8 (eight) hours as needed for anxiety.   10/18/2016 at Unknown time  . amLODipine (NORVASC) 10 MG tablet Take 1 tablet (10 mg total) by mouth daily. 30 tablet 0 10/18/2016 at Unknown time  . aspirin EC 81 MG tablet Take 81 mg by mouth daily.   10/18/2016 at Unknown time  . atorvastatin (LIPITOR) 20 MG tablet Take 20 mg by mouth daily at 6 PM.   10/17/2016 at Unknown time  . budesonide-formoterol (SYMBICORT) 160-4.5  MCG/ACT inhaler Inhale 2 puffs into the lungs 2 (two) times daily.   10/18/2016 at Unknown time  . cilostazol (PLETAL) 100 MG tablet Take 100 mg by mouth 2 (two) times daily.   10/18/2016 at 0900  . ipratropium-albuterol (DUONEB) 0.5-2.5 (3) MG/3ML SOLN Take 3 mLs by nebulization every 4 (four) hours as needed (for wheezing/shortness of breath).   10/18/2016 at Unknown time  . Menthol (ICY HOT) 5 % PTCH Apply 1 application topically daily. Pt applies to left upper arm every morning and removes at bedtime.   10/18/2016 at Unknown time  . omeprazole (PRILOSEC) 20 MG capsule Take 1 capsule (20 mg total) by mouth daily. 30 capsule 0 10/18/2016 at Unknown time  . OVER THE COUNTER MEDICATION Take 120 mLs by mouth 2 (two) times daily. Med pass   10/18/2016 at Unknown time  . oxycodone (OXY-IR) 5 MG capsule Take 5 mg by mouth every 6 (six) hours as needed for pain.   10/18/2016 at Unknown time  . OXYGEN Inhale 2 L into the lungs daily as needed (for low O2 sats).   10/18/2016 at Unknown time  . polyethylene glycol (MIRALAX /  GLYCOLAX) packet Take 17 g by mouth 2 (two) times daily.   10/18/2016 at Unknown time  . predniSONE (DELTASONE) 20 MG tablet Take 2 tablets (40 mg total) by mouth daily. 10 tablet 0 10/18/2016 at 0900  . Protein (PROCEL) POWD Take 2 scoop by mouth 2 (two) times daily.   10/18/2016 at Unknown time  . Rivaroxaban (XARELTO) 15 MG TABS tablet Take 1 tablet (15 mg total) by mouth 2 (two) times daily with a meal. 42 tablet 0 10/18/2016 at 0900  . senna-docusate (SENOKOT-S) 8.6-50 MG tablet Take 2 tablets by mouth 2 (two) times daily.   10/18/2016 at Unknown time   Assessment: Patient is a 66 y.ofemalewith recent diagnosis of apulmonary embolism (10/03/16) on Xarelto PTA admitted for evaluation of acute on chronic anemia. Transfused 2 units of PRBC. GI consulted,  EGD deferred 2/2 poor pulmonary status. Last dose of Xarelto 10/19/16 @ 0741. Per MD 1/5, begin heparin gtt with low goal and no bolus. Baseline heparin level  elevated at 0.61, remains elevated this morning at 0.76 while aPTT 31 at baseline and now therapeutic at 76. Will continue to dose off of aPTT until correlating. CBC stable, per RN no S/Sx bleeding.  Goal of Therapy:  Heparin level 0.3-0.5 units/ml aPTT 66-85 seconds Monitor platelets by anticoagulation protocol: Yes   Plan:  -Continue heparin infusion at 1000 units/hr -Check confirmatory 8-hr heparin level/aPTT -Monitor daily aPTT until heparin level correlating -Monitor S/Sx bleeding closely  Fredonia Highland, PharmD PGY-1 Pharmacy Resident Pager: [phone] 10/22/2016

## 2016-10-22 NOTE — Plan of Care (Signed)
Problem: Pain Managment: Goal: General experience of comfort will improve Outcome: Progressing DISCUSSED LACK OF MOVEMENT RELATED TO PAIN. OOB TO CHAIR

## 2016-10-22 NOTE — Progress Notes (Signed)
ANTICOAGULATION CONSULT NOTE - Follow-Up Consult  Pharmacy Consult for heparin > Xarelto Indication: pulmonary embolus  Allergies  Allergen Reactions  . Tylenol [Acetaminophen] Other (See Comments)    Reaction:  Shaking     Patient Measurements: Height: 5\' 3"  (160 cm) Weight: 147 lb (66.7 kg) IBW/kg (Calculated) : 52.4 Heparin Dosing Weight: 68 Kg  Vital Signs: Temp: 97.9 F (36.6 C) (01/06 1727) Temp Source: Oral (01/06 1727) BP: 124/65 (01/06 1727) Pulse Rate: 88 (01/06 1727)  Labs:  Recent Labs  10/20/16 0516 10/21/16 0549 10/21/16 1852 10/22/16 0622 10/22/16 0627 10/22/16 1655  HGB 8.8* 8.9*  --   --  8.5*  --   HCT 27.4* 28.2*  --   --  26.8*  --   PLT 290 278  --   --  291  --   APTT  --   --  31 71*  --  49*  HEPARINUNFRC  --   --  0.61  --  0.76* 0.53  CREATININE 1.07* 1.34*  --   --  1.43*  --    Estimated Creatinine Clearance: 25.9 mL/min (by C-G formula based on SCr of 1.43 mg/dL (H)).  Medical History: Past Medical History:  Diagnosis Date  . Carpal tunnel syndrome   . COPD (chronic obstructive pulmonary disease) (HCC)   . GERD (gastroesophageal reflux disease)   . Hyperlipidemia   . Hypertension   . Osteoarthritis    Medications:  Prescriptions Prior to Admission  Medication Sig Dispense Refill Last Dose  . alendronate (FOSAMAX) 70 MG tablet Take 70 mg by mouth every Thursday. Take with a full glass of water on an empty stomach.    10/13/2016  . ALPRAZolam (XANAX) 0.25 MG tablet Take 0.25 mg by mouth every 8 (eight) hours as needed for anxiety.   10/18/2016 at Unknown time  . amLODipine (NORVASC) 10 MG tablet Take 1 tablet (10 mg total) by mouth daily. 30 tablet 0 10/18/2016 at Unknown time  . aspirin EC 81 MG tablet Take 81 mg by mouth daily.   10/18/2016 at Unknown time  . atorvastatin (LIPITOR) 20 MG tablet Take 20 mg by mouth daily at 6 PM.   10/17/2016 at Unknown time  . budesonide-formoterol (SYMBICORT) 160-4.5 MCG/ACT inhaler Inhale 2 puffs  into the lungs 2 (two) times daily.   10/18/2016 at Unknown time  . cilostazol (PLETAL) 100 MG tablet Take 100 mg by mouth 2 (two) times daily.   10/18/2016 at 0900  . ipratropium-albuterol (DUONEB) 0.5-2.5 (3) MG/3ML SOLN Take 3 mLs by nebulization every 4 (four) hours as needed (for wheezing/shortness of breath).   10/18/2016 at Unknown time  . Menthol (ICY HOT) 5 % PTCH Apply 1 application topically daily. Pt applies to left upper arm every morning and removes at bedtime.   10/18/2016 at Unknown time  . omeprazole (PRILOSEC) 20 MG capsule Take 1 capsule (20 mg total) by mouth daily. 30 capsule 0 10/18/2016 at Unknown time  . OVER THE COUNTER MEDICATION Take 120 mLs by mouth 2 (two) times daily. Med pass   10/18/2016 at Unknown time  . oxycodone (OXY-IR) 5 MG capsule Take 5 mg by mouth every 6 (six) hours as needed for pain.   10/18/2016 at Unknown time  . OXYGEN Inhale 2 L into the lungs daily as needed (for low O2 sats).   10/18/2016 at Unknown time  . polyethylene glycol (MIRALAX / GLYCOLAX) packet Take 17 g by mouth 2 (two) times daily.   10/18/2016 at Unknown  time  . predniSONE (DELTASONE) 20 MG tablet Take 2 tablets (40 mg total) by mouth daily. 10 tablet 0 10/18/2016 at 0900  . Protein (PROCEL) POWD Take 2 scoop by mouth 2 (two) times daily.   10/18/2016 at Unknown time  . Rivaroxaban (XARELTO) 15 MG TABS tablet Take 1 tablet (15 mg total) by mouth 2 (two) times daily with a meal. 42 tablet 0 10/18/2016 at 0900  . senna-docusate (SENOKOT-S) 8.6-50 MG tablet Take 2 tablets by mouth 2 (two) times daily.   10/18/2016 at Unknown time   Assessment: Patient is a 58 y.ofemalewith recent diagnosis of apulmonary embolism (10/03/16) on Xarelto PTA admitted for evaluation of acute on chronic anemia. Transfused 2 units of PRBC. GI consulted,  EGD deferred 2/2 poor pulmonary status. Last dose of Xarelto 10/19/16 @ 0741. Per MD 1/5, begin heparin gtt with low goal and no bolus. Will continue to dose heparin off of aPTTs until  correlating with heparin levels. CBC stable, no bleeding or IV line issues per RN.  APTT low (49), heparin level still not correlating (0.53).   Pharmacy now consulted to resume Xarelto on 1/7 AM. Patient taking Xarelto 15mg  BID (appears started 12/20). Unfortunately, SCr has continued to trend up, now 1.43. Xarelto is contraindicated with CrCl<30 for PE indication.  Goal of Therapy:  Heparin level 0.3-0.5 units/ml aPTT 66-85 seconds Monitor platelets by anticoagulation protocol: Yes   Plan:  -Increase heparin infusion at 1150 units/hr -8-hr aPTT -Monitor daily aPTT until heparin level correlating, daily CBC -Monitor S/Sx bleeding closely  -D/c heparin in the AM at time of AM dose of Xarelto 15mg  BID w/ meals to complete 21 days of load; then 20mg  Qsupper -F/u SCr trend in the AM - Xarelto is contraindicated with CrCl<30 for PE indication   Babs Bertin, PharmD, BCPS Clinical Pharmacist 10/22/2016 5:55 PM

## 2016-10-22 NOTE — Progress Notes (Signed)
   Patient Name: Carrie Pena Date of Encounter: 10/22/2016, 3:52 PM    Subjective  Hx of decreased hemoglobin, heme + stool and epistaxis on Xarelto for PE   Objective  BP (!) 119/57 (BP Location: Left Arm)   Pulse 95   Temp 97.8 F (36.6 C) (Oral)   Resp 18   Ht 5\' 3"  (1.6 m)   Wt 147 lb (66.7 kg)   SpO2 98%   BMI 26.04 kg/m  NAD   Assessment and Plan  Epistaxis PTA Decreased Hgb Chronic anemia Small PE - on Xarelto  No documentation of melena in fact she has been constipated  Original plan was for an EGD given a 2 g drop in Hgb but she got COPD flare and hypoxic so postponed. Since then Hgb up and stable after transfusion. Patient says she had nosebleed for several hrs. Spoke by phone to SLM Corporationranddaughter hcpoa says she had been having nosebleeds since starting on O2  My view is that she had epistaxis and though we cannot quantify it seems most likely cause of the drop in Hgb  I have spoken to patient and explained that I do not recommend EGD Granddaughter wanted to "rule things out" - my thought are that given lack of overt GI bleeding I think yield likely low and if she had something like cancer her co-morbidities preclude Tx so retrial of Xarelto appropriate. PE is so small that probably not cause of resp distress vs COPD anyway   Discussed w/ Dr. Thedore MinsSingh -  Signing off Iva Booparl E. Zyheir Daft, MD, [facility] [phone] (pager) [phone] after 5 PM, weekends and holidays  10/22/2016 3:52 PM

## 2016-10-22 NOTE — Progress Notes (Signed)
PROGRESS NOTE        PATIENT DETAILS Name: Carrie Pena Age: 83 y.o. Sex: female Date of Birth: 26-Jun-1928 Admit Date: 10/18/2016 Admitting Physician Clydie Braun, MD ZOX:[id number],WJXBJ, MD  Brief Narrative: Patient is a 83 y.o female with history of COPD-recently placed on home O2 (2 L/m)- recent diagnosis of a pulmonary embolism (10/03/16) on Xarelto admitted for evaluation of acute on chronic anemia. Transfused 2 units of PRBC. GI consulted-plans are for EGD-however hospital course complicated by worsening hypoxemia likely due to HCAP/COPD exacerbation. See below for details.   Subjective:  Patient sitting in chair, denies any headache, no chest pain or abdominal pain, shortness of breath much improved, no focal weakness. No black stools or blood in the stool.  Assessment/Plan:    Likely subacute blood loss anemia on top of anemia of chronic disease: Also had significant epistaxis prior to admission, on PPI, H&H stable not requiring any transfusion, GI following likely EGD on 10/23/2016.  Acute on chronic respiratory failure with hypoxia:  Recent PE, worsened by combination of atelectasis and possibly atelectasis, clinically pneumonia less likely, she has no productive cough fever or classic infiltrate. Continue oxygen taper down as much as possible, supportive care with nebulizer treatments, tapered her antibiotics to oral Augmentin, monitor.  Possible mild COPD exacerbation: No wheezing at this time, tapered to oral steroids, continue supportive care with oxygen and nebulizers and monitor.  Recent diagnosis of pulmonary embolism:  lower activity venous duplex negative, PE was small, for now heparin drip, is stable and GI issues resolved switch back to xaralto if not IVC filter.  Chronic Lower extremity edema: Appears chronic-likely secondary to hypoalbuminemia, amlodipine use, although has grade 1 diastolic dysfunction on recent echocardiogram-but does  not have any other signs of Will monitor. Currently Lasix on hold due to ARF. We will apply TED stockings.  Chronic diastolic heart failure EF 60%: Likely compensated from a CHF standpoint.  ARF. Hold Lasix and hydrate.  History of peripheral vascular disease: Continue cilostazol-if endoscopy reveals major pathology-and not able to restart anticoagulation-will need to be restarted on aspirin.  Dyslipidemia: Continue statin   GERD: Continue PPI  Hypertension: Table off medications. Will try towards Norvasc upon discharge due to edema.  Tobacco Abuse:long standing history-counseled  Ethics/palliative care: DO NOT RESUSCITATE in place. Spoke to a granddaughter at length on 1/5, explained numerous medical comorbidities, advanced age and frailty-presumed acute blood loss anemia due to probable GI bleeding-contraindications to anticoagulation. Furthermore with her frailty/advanced age-risk of falls she is not a good long-term anticoagulation candidate. Explained that we will await GI evaluation/endoscopic evaluation-but at some point may need a palliative care consult. Family aware of poor overall long-term prognosis.  DVT Prophylaxis: Compression boot therapy. No longer taking Xeralto due to possible occult bleed.   Code Status: DNR-Confirmed with grand daughter over the phone by previous M.D.  Family Communication: Spoke with grand daughter (HPOA) over the phone on 10/22/2016  Disposition Plan: Remain inpatient-back to SNF sometime next week  Antimicrobial agents:  Anti-infectives    Start     Dose/Rate Route Frequency Ordered Stop   10/21/16 1200  vancomycin (VANCOCIN) IVPB 750 mg/150 ml premix     750 mg 150 mL/hr over 60 Minutes Intravenous Every 24 hours 10/21/16 1054     10/20/16 1200  piperacillin-tazobactam (ZOSYN) IVPB 3.375 g  3.375 g 12.5 mL/hr over 240 Minutes Intravenous Every 8 hours 10/20/16 1104     10/20/16 1200  vancomycin (VANCOCIN) IVPB 1000 mg/200 mL  premix  Status:  Discontinued     1,000 mg 200 mL/hr over 60 Minutes Intravenous Every 24 hours 10/20/16 1104 10/21/16 1054      Procedures: None  CONSULTS: GI  Time spent: 25 minutes-Greater than 50% of this time was spent in counseling, explanation of diagnosis, planning of further management, and coordination of care.  MEDICATIONS: Scheduled Meds: . atorvastatin  20 mg Oral q1800  . cilostazol  100 mg Oral BID  . furosemide  40 mg Intravenous BID  . ipratropium-albuterol  3 mL Nebulization Q6H  . methylPREDNISolone (SOLU-MEDROL) injection  60 mg Intravenous Q12H  .  morphine injection  2 mg Intravenous Once  . pantoprazole  40 mg Oral BID  . potassium chloride  20 mEq Oral Daily      Continuous Infusions: . sodium chloride    . sodium chloride 75 mL/hr at 10/22/16 1022  . heparin 1,000 Units/hr (10/21/16 2047)   PRN Meds:.albuterol, ALPRAZolam, ondansetron **OR** ondansetron (ZOFRAN) IV, sodium chloride (OCEAN) nasal spray    PHYSICAL EXAM: Vital signs: Vitals:   10/21/16 2113 10/21/16 2131 10/22/16 0459 10/22/16 0959  BP:  (!) 109/47 (!) 93/50 (!) 119/57  Pulse:  91 89 95  Resp:  18 17 18   Temp:  97.7 F (36.5 C) 98.4 F (36.9 C) 97.8 F (36.6 C)  TempSrc:  Oral Oral Oral  SpO2: 94% 94% 95% 93%  Weight:  66.7 kg (147 lb)    Height:       Filed Weights   10/18/16 2320 10/19/16 2124 10/21/16 2131  Weight: 70.1 kg (154 lb 9.6 oz) 68.9 kg (152 lb) 66.7 kg (147 lb)   Body mass index is 26.04 kg/m.   General appearance :Awake, alert, not in any distress. Speech Clear. Not toxic Looking Resp:Good air entry bilaterally, Few scattered wheezing CVS: S1 S2 regular, no murmurs.  GI: Bowel sounds present, Non tender and not distended with no gaurding, rigidity or rebound.No organomegaly Extremities: Both legs are warm to touch, bilateral lower extremity with trace edema present Neurology:  speech clear,Non focal, sensation is grossly intact. Psychiatric: Normal  judgment and insight. Alert and oriented x 3. Normal mood. Musculoskeletal:No digital cyanosis Skin:No Rash, warm and dry  I have personally reviewed following labs and imaging studies  LABORATORY DATA: CBC:  Recent Labs Lab 10/18/16 1959 10/19/16 0455 10/19/16 1044 10/20/16 0516 10/21/16 0549 10/22/16 0627  WBC 11.6* 11.2*  --  11.7* 8.6 7.6  HGB 6.0* 7.7* 8.7* 8.8* 8.9* 8.5*  HCT 19.3* 23.7* 27.2* 27.4* 28.2* 26.8*  MCV 106.0* 94.0  --  94.8 97.6 95.4  PLT 335 275  --  290 278 291  Slight leukocytosis noted, along with low Hgb and Hct. Encouraged Hgb is trending up. Endoscopy will help r/o occult blood loss. CXR will help r/o presence of pneumonia.   Basic Metabolic Panel:  Recent Labs Lab 10/18/16 1959 10/19/16 0455 10/20/16 0516 10/21/16 0549 10/22/16 0627  NA 137 137 137 135 137  K 4.4 4.0 3.9 4.2 3.7  CL 106 105 102 101 102  CO2 24 27 28 25 26   GLUCOSE 117* 87 84 149* 148*  BUN 30* 26* 21* 32* 37*  CREATININE 1.25* 1.05* 1.07* 1.34* 1.43*  CALCIUM 8.1* 7.9* 8.3* 7.8* 7.5*    GFR: Estimated Creatinine Clearance: 25.9 mL/min (by C-G formula based  on SCr of 1.43 mg/dL (H)).  Liver Function Tests:  Recent Labs Lab 10/18/16 1959  AST 18  ALT 13*  ALKPHOS 55  BILITOT 0.3  PROT 6.1*  ALBUMIN 2.5*   No results for input(s): LIPASE, AMYLASE in the last 168 hours. No results for input(s): AMMONIA in the last 168 hours.  Coagulation Profile:  Recent Labs Lab 10/18/16 2120  INR 1.91    Cardiac Enzymes:  Recent Labs Lab 10/19/16 0455 10/19/16 1044  TROPONINI <0.03 <0.03    BNP (last 3 results) No results for input(s): PROBNP in the last 8760 hours.  HbA1C: No results for input(s): HGBA1C in the last 72 hours.  CBG: No results for input(s): GLUCAP in the last 168 hours.  Lipid Profile: No results for input(s): CHOL, HDL, LDLCALC, TRIG, CHOLHDL, LDLDIRECT in the last 72 hours.  Thyroid Function Tests: No results for input(s): TSH,  T4TOTAL, FREET4, T3FREE, THYROIDAB in the last 72 hours.  Anemia Panel: No results for input(s): VITAMINB12, FOLATE, FERRITIN, TIBC, IRON, RETICCTPCT in the last 72 hours.  Urine analysis:    Component Value Date/Time   COLORURINE YELLOW 09/29/2016 1240   APPEARANCEUR HAZY (A) 09/29/2016 1240   LABSPEC 1.010 09/29/2016 1240   PHURINE 5.0 09/29/2016 1240   GLUCOSEU NEGATIVE 09/29/2016 1240   HGBUR SMALL (A) 09/29/2016 1240   BILIRUBINUR NEGATIVE 09/29/2016 1240   KETONESUR 5 (A) 09/29/2016 1240   PROTEINUR NEGATIVE 09/29/2016 1240   UROBILINOGEN 0.2 11/08/2009 1426   NITRITE NEGATIVE 09/29/2016 1240   LEUKOCYTESUR NEGATIVE 09/29/2016 1240    Sepsis Labs: Lactic Acid, Venous    Component Value Date/Time   LATICACIDVEN 1.38 09/29/2016 1431    MICROBIOLOGY: Recent Results (from the past 240 hour(s))  Culture, blood (routine x 2)     Status: None (Preliminary result)   Collection Time: 10/20/16 11:00 AM  Result Value Ref Range Status   Specimen Description BLOOD RIGHT HAND  Final   Special Requests BOTTLES DRAWN AEROBIC ONLY  Final   Culture NO GROWTH 2 DAYS  Final   Report Status PENDING  Incomplete  Culture, blood (routine x 2)     Status: None (Preliminary result)   Collection Time: 10/20/16 11:04 AM  Result Value Ref Range Status   Specimen Description BLOOD RIGHT ANTECUBITAL  Final   Special Requests BOTTLES DRAWN AEROBIC ONLY  Final   Culture NO GROWTH 2 DAYS  Final   Report Status PENDING  Incomplete    RADIOLOGY STUDIES/RESULTS: Dg Chest 2 View  Result Date: 10/11/2016 CLINICAL DATA:  Cough and shortness of breath tonight. Recent diagnosis of pulmonary embolus. EXAM: CHEST  2 VIEW COMPARISON:  Radiograph 03/02/2016, CT 10/03/2016 FINDINGS: Lower lung volumes from prior exam. Mild emphysema. Tortuous thoracic aorta with atherosclerosis. The heart is normal in size. No pulmonary edema. Minimal left infrahilar atelectasis. No confluent airspace disease,  pleural effusion or pneumothorax. Advanced degenerative change of the left shoulder. IMPRESSION: Left lung base atelectasis. Thoracic aortic atherosclerosis. Electronically Signed   By: Rubye Oaks M.D.   On: 10/11/2016 00:08   Dg Chest 2 View  Result Date: 09/29/2016 CLINICAL DATA: Chest pain EXAM: CHEST  2 VIEW COMPARISON:  September 01, 2016 FINDINGS: There is slight scarring in the right base. There is no edema or consolidation. Heart size and pulmonary vascularity are normal. No adenopathy. There is atherosclerotic calcification in the aorta. Aorta is tortuous but stable. There is arthropathy in each shoulder. IMPRESSION: No edema or consolidation. Slight scarring right  base. Stable cardiac silhouette. There aorta is tortuous with aortic atherosclerosis. Electronically Signed   By: Bretta BangWilliam  Woodruff III M.D.   On: 09/29/2016 13:30   Ct Angio Chest Pe W And/or Wo Contrast  Result Date: 10/03/2016 CLINICAL DATA:  Increased difficulty breathing.  Short of breath. EXAM: CT ANGIOGRAPHY CHEST WITH CONTRAST TECHNIQUE: Multidetector CT imaging of the chest was performed using the standard protocol during bolus administration of intravenous contrast. Multiplanar CT image reconstructions and MIPs were obtained to evaluate the vascular anatomy. CONTRAST:  75 mL Isovue COMPARISON:  Radiograph 10/02/2016 FINDINGS: Cardiovascular: A thin tubular filling defect within the LEFT upper lobe pulmonary artery (image 93, series 7). No additional pulmonary emboli. Overall clot burden is minimal. No RIGHT ventricular strain present. No pericardial fluid. No acute findings aorta great vessels. Mediastinum/Nodes: No axillary or supraclavicular lymphadenopathy. No mediastinal hilar adenopathy. No pericardial fluid. Lungs/Pleura: Centrilobular emphysema in the upper lobes. No pulmonary infarction. No airspace disease. No pleural fluid or pneumothorax Within the LEFT lower lobe 5 mm pulmonary nodule (image 32, series 8).  Upper Abdomen: Limited view of the liver, kidneys, pancreas are unremarkable. Normal adrenal glands. Musculoskeletal: No aggressive osseous lesion. Review of the MIP images confirms the above findings. IMPRESSION: 1. Small acute pulmonary embolism within the LEFT upper lobe pulmonary artery. Overall clot burden is minimal. No RIGHT ventricular strain evident. 2. Coronary artery calcification and aortic atherosclerotic calcification. 3. **An incidental finding of potential clinical significance has been found. ** Small LEFT lower lobe pulmonary nodule. No follow-up needed if patient is low-risk. Non-contrast chest CT can be considered in 12 months if patient is high-risk. This recommendation follows the consensus statement: Guidelines for Management of Incidental Pulmonary Nodules Detected on CT Images: From the Fleischner Society 2017; Radiology 2017; 284:228-243. Critical Value/emergent results were called by telephone at the time of interpretation on 10/03/2016 at 5:47 pm to Dr. Linwood DibblesJON KNAPP , who verbally acknowledged these results. Electronically Signed   By: Genevive BiStewart  Edmunds M.D.   On: 10/03/2016 17:49   Ct Abdomen Pelvis W Contrast  Result Date: 09/30/2016 CLINICAL DATA:  History of iliopsoas hematoma, recent falls with left-sided pain, initial encounter EXAM: CT ABDOMEN AND PELVIS WITH CONTRAST TECHNIQUE: Multidetector CT imaging of the abdomen and pelvis was performed using the standard protocol following bolus administration of intravenous contrast. CONTRAST:  75 mL Isovue-300 COMPARISON:  09/01/2016. FINDINGS: Lower chest: Bilateral lower lobe atelectasis. No sizable effusion is seen. Small hiatal hernia is noted. Hepatobiliary: No focal liver abnormality is seen. No gallstones, gallbladder wall thickening, or biliary dilatation. Pancreas: Unremarkable. No pancreatic ductal dilatation or surrounding inflammatory changes. Spleen: Multiple calcified granulomas are noted. The spleen is otherwise within  normal limits. Adrenals/Urinary Tract: Renal vascular calcifications are noted. No calculi are seen. No obstructive changes are noted. The adrenal glands show mild fullness on the left likely related to hyperplasia. Stomach/Bowel: Diffuse diverticular change of the colon is noted. The appendix is within normal limits. No inflammatory changes are noted. Vascular/Lymphatic: Diffuse aortic calcifications are noted. There are changes consistent within infrarenal aortic aneurysm. It measures 6.8 x 6.4 cm in greatest AP and transverse dimensions respectively. Significant mural thrombus is identified no extravasation is identified. The aneurysm extends to the aortic bifurcation. Diffuse calcifications are noted throughout the iliac vessels without aneurysmal dilatation. Reproductive: Uterus and bilateral adnexa are unremarkable. Other: Left inguinal hernia is noted with multiple loops of small bowel within. No obstructive changes are seen. This is stable from the prior exam. Musculoskeletal: Degenerative  changes of lumbar spine are seen. IMPRESSION: Mild bibasilar atelectatic changes without pleural effusion. Abdominal aortic aneurysm as described. Vascular surgery consultation recommended due to increased risk of rupture for AAA >5.5 cm. This recommendation follows ACR consensus guidelines: White Paper of the ACR Incidental Findings Committee II on Vascular Findings. J Am Coll Radiol 2013; 10:789-794. Diverticulosis without diverticulitis. Left inguinal hernia containing small bowel loops without incarceration. Electronically Signed   By: Alcide Clever M.D.   On: 09/30/2016 15:56   Dg Chest Port 1 View  Result Date: 10/20/2016 CLINICAL DATA:  Shortness of breath. EXAM: PORTABLE CHEST 1 VIEW COMPARISON:  10/18/2016.  10/10/2016 . FINDINGS: Mediastinum hilar structures normal. Heart size normal. Low lung volumes with basilar atelectasis . Right base infiltrate suggesting pneumonia. No acute bony abnormality identified.  IMPRESSION: Right base infiltrate noted consistent pneumonia. Low lung volumes with mild bibasilar atelectasis. Electronically Signed   By: Maisie Fus  Register   On: 10/20/2016 11:00   Dg Chest Port 1 View  Result Date: 10/18/2016 CLINICAL DATA:  Dyspnea EXAM: PORTABLE CHEST 1 VIEW COMPARISON:  10/10/2016 CXR FINDINGS: The heart size and mediastinal contours are within normal limits. Aortic atherosclerosis is noted. Patchy airspace disease at the right lung base suspicious for pneumonia. Subsegmental can platelike atelectasis at the left lung base. Osteoarthritis of the glenohumeral joints. IMPRESSION: Patchy new airspace opacity at the right lung base suspicious for pneumonia. Left lower lobe atelectasis. Electronically Signed   By: Tollie Eth M.D.   On: 10/18/2016 23:58   Dg Chest Port 1 View  Result Date: 10/02/2016 CLINICAL DATA:  Shortness of breath. EXAM: PORTABLE CHEST 1 VIEW COMPARISON:  09/29/2016 FINDINGS: Lungs are hyperinflated. There is mild perihilar peribronchial thickening. No focal consolidations or pleural effusions are identified. Chronic changes are identified in the left shoulder. IMPRESSION: Hyperinflation and bronchitic changes. No focal acute pulmonary abnormality. Electronically Signed   By: Norva Pavlov M.D.   On: 10/02/2016 16:23     LOS: 4 days   Kylil Swopes K,     If 7PM-7AM, please contact night-coverage [email or web address] Password [email or web address] 10/22/2016, 11:56 AM

## 2016-10-23 ENCOUNTER — Inpatient Hospital Stay (HOSPITAL_COMMUNITY): Payer: Medicare Other

## 2016-10-23 DIAGNOSIS — I110 Hypertensive heart disease with heart failure: Secondary | ICD-10-CM | POA: Diagnosis present

## 2016-10-23 DIAGNOSIS — Z66 Do not resuscitate: Secondary | ICD-10-CM | POA: Diagnosis present

## 2016-10-23 DIAGNOSIS — Z7982 Long term (current) use of aspirin: Secondary | ICD-10-CM | POA: Diagnosis not present

## 2016-10-23 DIAGNOSIS — R410 Disorientation, unspecified: Secondary | ICD-10-CM | POA: Diagnosis not present

## 2016-10-23 DIAGNOSIS — J189 Pneumonia, unspecified organism: Secondary | ICD-10-CM | POA: Diagnosis not present

## 2016-10-23 DIAGNOSIS — J9801 Acute bronchospasm: Secondary | ICD-10-CM | POA: Diagnosis not present

## 2016-10-23 DIAGNOSIS — R41841 Cognitive communication deficit: Secondary | ICD-10-CM | POA: Diagnosis not present

## 2016-10-23 DIAGNOSIS — Z86711 Personal history of pulmonary embolism: Secondary | ICD-10-CM | POA: Diagnosis not present

## 2016-10-23 DIAGNOSIS — Z515 Encounter for palliative care: Secondary | ICD-10-CM | POA: Diagnosis not present

## 2016-10-23 DIAGNOSIS — R0602 Shortness of breath: Secondary | ICD-10-CM | POA: Diagnosis not present

## 2016-10-23 DIAGNOSIS — I5032 Chronic diastolic (congestive) heart failure: Secondary | ICD-10-CM | POA: Diagnosis not present

## 2016-10-23 DIAGNOSIS — K5900 Constipation, unspecified: Secondary | ICD-10-CM | POA: Diagnosis not present

## 2016-10-23 DIAGNOSIS — N179 Acute kidney failure, unspecified: Secondary | ICD-10-CM | POA: Diagnosis not present

## 2016-10-23 DIAGNOSIS — R031 Nonspecific low blood-pressure reading: Secondary | ICD-10-CM | POA: Diagnosis not present

## 2016-10-23 DIAGNOSIS — N170 Acute kidney failure with tubular necrosis: Secondary | ICD-10-CM | POA: Diagnosis present

## 2016-10-23 DIAGNOSIS — J9620 Acute and chronic respiratory failure, unspecified whether with hypoxia or hypercapnia: Secondary | ICD-10-CM | POA: Diagnosis not present

## 2016-10-23 DIAGNOSIS — D638 Anemia in other chronic diseases classified elsewhere: Secondary | ICD-10-CM | POA: Diagnosis not present

## 2016-10-23 DIAGNOSIS — A419 Sepsis, unspecified organism: Secondary | ICD-10-CM | POA: Diagnosis not present

## 2016-10-23 DIAGNOSIS — J101 Influenza due to other identified influenza virus with other respiratory manifestations: Secondary | ICD-10-CM | POA: Diagnosis not present

## 2016-10-23 DIAGNOSIS — G9340 Encephalopathy, unspecified: Secondary | ICD-10-CM | POA: Diagnosis not present

## 2016-10-23 DIAGNOSIS — D539 Nutritional anemia, unspecified: Secondary | ICD-10-CM | POA: Diagnosis not present

## 2016-10-23 DIAGNOSIS — J111 Influenza due to unidentified influenza virus with other respiratory manifestations: Secondary | ICD-10-CM | POA: Diagnosis not present

## 2016-10-23 DIAGNOSIS — I739 Peripheral vascular disease, unspecified: Secondary | ICD-10-CM | POA: Diagnosis not present

## 2016-10-23 DIAGNOSIS — E785 Hyperlipidemia, unspecified: Secondary | ICD-10-CM | POA: Diagnosis present

## 2016-10-23 DIAGNOSIS — R062 Wheezing: Secondary | ICD-10-CM | POA: Diagnosis not present

## 2016-10-23 DIAGNOSIS — Z87891 Personal history of nicotine dependence: Secondary | ICD-10-CM | POA: Diagnosis not present

## 2016-10-23 DIAGNOSIS — R05 Cough: Secondary | ICD-10-CM | POA: Diagnosis not present

## 2016-10-23 DIAGNOSIS — R710 Precipitous drop in hematocrit: Secondary | ICD-10-CM | POA: Diagnosis not present

## 2016-10-23 DIAGNOSIS — Z7189 Other specified counseling: Secondary | ICD-10-CM | POA: Diagnosis not present

## 2016-10-23 DIAGNOSIS — R509 Fever, unspecified: Secondary | ICD-10-CM | POA: Diagnosis not present

## 2016-10-23 DIAGNOSIS — D62 Acute posthemorrhagic anemia: Secondary | ICD-10-CM | POA: Diagnosis not present

## 2016-10-23 DIAGNOSIS — F419 Anxiety disorder, unspecified: Secondary | ICD-10-CM | POA: Diagnosis not present

## 2016-10-23 DIAGNOSIS — K5909 Other constipation: Secondary | ICD-10-CM | POA: Diagnosis not present

## 2016-10-23 DIAGNOSIS — J9621 Acute and chronic respiratory failure with hypoxia: Secondary | ICD-10-CM | POA: Diagnosis not present

## 2016-10-23 DIAGNOSIS — I2699 Other pulmonary embolism without acute cor pulmonale: Secondary | ICD-10-CM | POA: Diagnosis not present

## 2016-10-23 DIAGNOSIS — K922 Gastrointestinal hemorrhage, unspecified: Secondary | ICD-10-CM | POA: Diagnosis not present

## 2016-10-23 DIAGNOSIS — Z79899 Other long term (current) drug therapy: Secondary | ICD-10-CM | POA: Diagnosis not present

## 2016-10-23 DIAGNOSIS — M810 Age-related osteoporosis without current pathological fracture: Secondary | ICD-10-CM | POA: Diagnosis not present

## 2016-10-23 DIAGNOSIS — J449 Chronic obstructive pulmonary disease, unspecified: Secondary | ICD-10-CM | POA: Diagnosis not present

## 2016-10-23 DIAGNOSIS — D649 Anemia, unspecified: Secondary | ICD-10-CM | POA: Diagnosis not present

## 2016-10-23 DIAGNOSIS — Z7901 Long term (current) use of anticoagulants: Secondary | ICD-10-CM | POA: Diagnosis not present

## 2016-10-23 DIAGNOSIS — R5381 Other malaise: Secondary | ICD-10-CM | POA: Diagnosis not present

## 2016-10-23 DIAGNOSIS — I714 Abdominal aortic aneurysm, without rupture: Secondary | ICD-10-CM | POA: Diagnosis present

## 2016-10-23 DIAGNOSIS — R6520 Severe sepsis without septic shock: Secondary | ICD-10-CM | POA: Diagnosis not present

## 2016-10-23 DIAGNOSIS — M6281 Muscle weakness (generalized): Secondary | ICD-10-CM | POA: Diagnosis not present

## 2016-10-23 DIAGNOSIS — K219 Gastro-esophageal reflux disease without esophagitis: Secondary | ICD-10-CM | POA: Diagnosis not present

## 2016-10-23 DIAGNOSIS — R531 Weakness: Secondary | ICD-10-CM | POA: Diagnosis not present

## 2016-10-23 DIAGNOSIS — R296 Repeated falls: Secondary | ICD-10-CM | POA: Diagnosis not present

## 2016-10-23 DIAGNOSIS — E876 Hypokalemia: Secondary | ICD-10-CM | POA: Diagnosis not present

## 2016-10-23 DIAGNOSIS — J441 Chronic obstructive pulmonary disease with (acute) exacerbation: Secondary | ICD-10-CM | POA: Diagnosis not present

## 2016-10-23 DIAGNOSIS — D500 Iron deficiency anemia secondary to blood loss (chronic): Secondary | ICD-10-CM | POA: Diagnosis not present

## 2016-10-23 DIAGNOSIS — R29898 Other symptoms and signs involving the musculoskeletal system: Secondary | ICD-10-CM | POA: Diagnosis not present

## 2016-10-23 LAB — CBC
HCT: 26.3 % — ABNORMAL LOW (ref 36.0–46.0)
Hemoglobin: 8.3 g/dL — ABNORMAL LOW (ref 12.0–15.0)
MCH: 30.5 pg (ref 26.0–34.0)
MCHC: 31.6 g/dL (ref 30.0–36.0)
MCV: 96.7 fL (ref 78.0–100.0)
PLATELETS: 279 10*3/uL (ref 150–400)
RBC: 2.72 MIL/uL — AB (ref 3.87–5.11)
RDW: 19.1 % — ABNORMAL HIGH (ref 11.5–15.5)
WBC: 7.9 10*3/uL (ref 4.0–10.5)

## 2016-10-23 LAB — BASIC METABOLIC PANEL
ANION GAP: 7 (ref 5–15)
BUN: 37 mg/dL — AB (ref 6–20)
CALCIUM: 7.3 mg/dL — AB (ref 8.9–10.3)
CO2: 26 mmol/L (ref 22–32)
Chloride: 105 mmol/L (ref 101–111)
Creatinine, Ser: 1.25 mg/dL — ABNORMAL HIGH (ref 0.44–1.00)
GFR calc Af Amer: 44 mL/min — ABNORMAL LOW (ref >60)
GFR, EST NON AFRICAN AMERICAN: 38 mL/min — AB (ref >60)
GLUCOSE: 122 mg/dL — AB (ref 65–99)
Potassium: 3.9 mmol/L (ref 3.5–5.1)
SODIUM: 138 mmol/L (ref 135–145)

## 2016-10-23 LAB — APTT: APTT: 92 s — AB (ref 24–36)

## 2016-10-23 LAB — HEPARIN LEVEL (UNFRACTIONATED): Heparin Unfractionated: 0.74 IU/mL — ABNORMAL HIGH (ref 0.30–0.70)

## 2016-10-23 MED ORDER — RIVAROXABAN 20 MG PO TABS: 20.0000 mg | ORAL_TABLET | Freq: Every day | ORAL | Status: DC

## 2016-10-23 NOTE — Progress Notes (Signed)
ANTICOAGULATION CONSULT NOTE - Follow Up Consult  Pharmacy Consult for Heparin  Indication: pulmonary embolus  Allergies  Allergen Reactions  . Tylenol [Acetaminophen] Other (See Comments)    Reaction:  Shaking     Patient Measurements: Height: 5\' 3"  (160 cm) Weight: 148 lb 2.4 oz (67.2 kg) IBW/kg (Calculated) : 52.4 Vital Signs: Temp: 97.7 F (36.5 C) (01/06 2055) Temp Source: Oral (01/06 2055) BP: 109/53 (01/06 2055) Pulse Rate: 92 (01/06 2055)  Labs:  Recent Labs  10/21/16 0549  10/22/16 0622 10/22/16 0627 10/22/16 1655 10/23/16 0404  HGB 8.9*  --   --  8.5*  --  8.3*  HCT 28.2*  --   --  26.8*  --  26.3*  PLT 278  --   --  291  --  279  APTT  --   < > 71*  --  49* 92*  HEPARINUNFRC  --   < >  --  0.76* 0.53 0.74*  CREATININE 1.34*  --   --  1.43*  --  1.25*  < > = values in this interval not displayed.  Estimated Creatinine Clearance: 29.7 mL/min (by C-G formula based on SCr of 1.25 mg/dL (H)).    Assessment: Heparin with plans to start Xarelto later this AM, aPTT is currently therapeutic  Goal of Therapy:  Heparin level 0.3-0.7 units/ml aPTT 66-102 seconds Monitor platelets by anticoagulation protocol: Yes   Plan:  -Cont heparin 1150 units/hr until Xarelto is given with breakfast -No need for f/u heparin level/aPTT with plans for Xarelto start  Abran DukeLedford, Leeza Heiner 10/23/2016,4:55 AM

## 2016-10-23 NOTE — Discharge Summary (Signed)
Carrie Pena WUJ:[id number] DOB: Aug 28, 1928 DOA: 10/18/2016  PCP: Thayer Headings, MD  Admit date: 10/18/2016  Discharge date: 10/23/2016  Admitted From: SNF   Disposition:  SNF   Recommendations for Outpatient Follow-up:   Follow up with PCP in 1-2 weeks  PCP Please obtain BMP/CBC, 2 view CXR in 1week,  (see Discharge instructions)   PCP Please follow up on the following pending results: None   Home Health: PT-RN   Equipment/Devices: O2  Consultations: GI Discharge Condition: Fair   CODE STATUS: DNR   Diet Recommendation:  Heart Healthy    Chief Complaint  Patient presents with  . Abnormal Lab    pt sent in for eval of low Hgb      Brief history of present illness from the day of admission and additional interim summary    Patient is a 6 y.ofemalewith history of COPD-recently placed on home O2 (2 L/m)- recent diagnosis of apulmonary embolism (10/03/16) on Xarelto admitted for evaluation of acute on chronic anemia. Transfused 2 units of PRBC. GI consulted-plans are for EGD-however hospital course complicated by worsening hypoxemia likely due to HCAP/COPD exacerbation. See below for details.    Hospital issues addressed     Likely subacute blood loss anemia on top of anemia of chronic disease: Also had significant epistaxis prior to admission - H&H remained stable, seen by GI, upper GI likely acute drop in H&H was due to the nosebleed, patient tolerated initially IV heparin and then oral xaralto without any further issues with bleeding, H&H has remained stable will be discharged back to SNF/ALF on home dose xaralto for recently diagnosed PE along with oral PPI. Request SNF M.D. to monitor H&H closely, if nosebleed becomes an issue or bleeding becomes an issue xaralto use can be reconsidered.   Acute on  chronic respiratory failure with hypoxia:  Recent PE, worsened by combination of atelectasis and possibly mild COPD exacerbation, clinically pneumonia less likely, she has no productive cough fever or classic infiltrate. Continue oxygen now down to 2 L nasal cannula which will be continued, no further antibiotics, continue home dose steroids, continue supportive care with nebulizer treatments as needed at the SNF/ALF. She is now symptom-free. Continue anticoagulation for PE.    Possible mild COPD exacerbation: No wheezing at this time, tapered to oral steroids, continue supportive care with oxygen and nebulizers and monitor.  Recent diagnosis of pulmonary embolism: lower activity venous duplex negative, PE was small, for now heparin drip, is stable and GI issues resolved switch back to xaralto if not IVC filter.  Chronic Lower extremity edema: Appears chronic-likely secondary to hypoalbuminemia, amlodipine use, although has grade 1 diastolic dysfunction on recent echocardiogram-but does not have any other signs of Will monitor. Currently Lasix on hold due to ARF. We will apply TED stockings.  Chronic diastolic heart failure EF 60%: BNP stable compensated from a CHF standpoint.  ARF. Improved with hydration, repeat BMP in a week.  History of peripheral vascular disease:Continue cilostazol-if endoscopy reveals major pathology-and not able to restart anticoagulation-will  need to be restarted on aspirin.  Dyslipidemia:Continue statin   GERD:Continue PPI  Hypertension: Stable off blood pressure medications.  Tobacco Abuse: long standing history-counseled   Discharge diagnosis     Principal Problem:   Decreased hemoglobin Active Problems:   Acute on chronic respiratory failure with hypoxia (HCC)   Pulmonary embolus (HCC)   Renal insufficiency   PAD (peripheral artery disease) (HCC)    Discharge instructions    Discharge Instructions    Diet - low sodium heart healthy     Complete by:  As directed    Discharge instructions    Complete by:  As directed    Follow with Primary MD Thayer Headings, MD in 7 days   Get CBC, CMP, 2 view Chest X ray checked  by Primary MD or SNF MD in 5-7 days ( we routinely change or add medications that can affect your baseline labs and fluid status, therefore we recommend that you get the mentioned basic workup next visit with your PCP, your PCP may decide not to get them or add new tests based on their clinical decision)   Activity: As tolerated with Full fall precautions use walker/cane & assistance as needed   Disposition Home     Diet:   Heart Healthy    For Heart failure patients - Check your Weight same time everyday, if you gain over 2 pounds, or you develop in leg swelling, experience more shortness of breath or chest pain, call your Primary MD immediately. Follow Cardiac Low Salt Diet and 1.5 lit/day fluid restriction.   On your next visit with your primary care physician please Get Medicines reviewed and adjusted.   Please request your Prim.MD to go over all Hospital Tests and Procedure/Radiological results at the follow up, please get all Hospital records sent to your Prim MD by signing hospital release before you go home.   If you experience worsening of your admission symptoms, develop shortness of breath, life threatening emergency, suicidal or homicidal thoughts you must seek medical attention immediately by calling 911 or calling your MD immediately  if symptoms less severe.  You Must read complete instructions/literature along with all the possible adverse reactions/side effects for all the Medicines you take and that have been prescribed to you. Take any new Medicines after you have completely understood and accpet all the possible adverse reactions/side effects.   Do not drive, operate heavy machinery, perform activities at heights, swimming or participation in water activities or provide baby sitting  services if your were admitted for syncope or siezures until you have seen by Primary MD or a Neurologist and advised to do so again.  Do not drive when taking Pain medications.    Do not take more than prescribed Pain, Sleep and Anxiety Medications  Special Instructions: If you have smoked or chewed Tobacco  in the last 2 yrs please stop smoking, stop any regular Alcohol  and or any Recreational drug use.  Wear Seat belts while driving.   Please note  You were cared for by a hospitalist during your hospital stay. If you have any questions about your discharge medications or the care you received while you were in the hospital after you are discharged, you can call the unit and asked to speak with the hospitalist on call if the hospitalist that took care of you is not available. Once you are discharged, your primary care physician will handle any further medical issues. Please note that NO REFILLS for any discharge medications will  be authorized once you are discharged, as it is imperative that you return to your primary care physician (or establish a relationship with a primary care physician if you do not have one) for your aftercare needs so that they can reassess your need for medications and monitor your lab values.   Increase activity slowly    Complete by:  As directed       Discharge Medications   Allergies as of 10/23/2016      Reactions   Tylenol [acetaminophen] Other (See Comments)   Reaction:  Shaking       Medication List    STOP taking these medications   amLODipine 10 MG tablet Commonly known as:  NORVASC     TAKE these medications   alendronate 70 MG tablet Commonly known as:  FOSAMAX Take 70 mg by mouth every Thursday. Take with a full glass of water on an empty stomach.   ALPRAZolam 0.25 MG tablet Commonly known as:  XANAX Take 0.25 mg by mouth every 8 (eight) hours as needed for anxiety.   aspirin EC 81 MG tablet Take 81 mg by mouth daily.     atorvastatin 20 MG tablet Commonly known as:  LIPITOR Take 20 mg by mouth daily at 6 PM.   budesonide-formoterol 160-4.5 MCG/ACT inhaler Commonly known as:  SYMBICORT Inhale 2 puffs into the lungs 2 (two) times daily.   cilostazol 100 MG tablet Commonly known as:  PLETAL Take 100 mg by mouth 2 (two) times daily.   ICY HOT 5 % Ptch Generic drug:  Menthol Apply 1 application topically daily. Pt applies to left upper arm every morning and removes at bedtime.   ipratropium-albuterol 0.5-2.5 (3) MG/3ML Soln Commonly known as:  DUONEB Take 3 mLs by nebulization every 4 (four) hours as needed (for wheezing/shortness of breath).   omeprazole 20 MG capsule Commonly known as:  PRILOSEC Take 1 capsule (20 mg total) by mouth daily.   OVER THE COUNTER MEDICATION Take 120 mLs by mouth 2 (two) times daily. Med pass   oxycodone 5 MG capsule Commonly known as:  OXY-IR Take 5 mg by mouth every 6 (six) hours as needed for pain.   OXYGEN Inhale 2 L into the lungs daily as needed (for low O2 sats).   polyethylene glycol packet Commonly known as:  MIRALAX / GLYCOLAX Take 17 g by mouth 2 (two) times daily.   predniSONE 20 MG tablet Commonly known as:  DELTASONE Take 2 tablets (40 mg total) by mouth daily.   PROCEL Powd Take 2 scoop by mouth 2 (two) times daily.   Rivaroxaban 15 MG Tabs tablet Commonly known as:  XARELTO Take 1 tablet (15 mg total) by mouth 2 (two) times daily with a meal.   senna-docusate 8.6-50 MG tablet Commonly known as:  Senokot-S Take 2 tablets by mouth 2 (two) times daily.            Durable Medical Equipment        Start     Ordered   10/23/16 1155  For home use only DME oxygen  Once    Question Answer Comment  Mode or (Route) Nasal cannula   Liters per Minute 2   Frequency Continuous (stationary and portable oxygen unit needed)   Oxygen conserving device Yes   Oxygen delivery system Gas      10/23/16 1154      Follow-up Information     Thayer Headings, MD. Schedule an appointment as soon as possible for a  visit in 1 week(s).   Specialty:  Internal Medicine Contact information: [address], [address] [place] [address] [phone]           Major procedures and Radiology Reports - PLEASE review detailed and final reports thoroughly  -         Dg Chest 2 View  Result Date: 10/11/2016 CLINICAL DATA:  Cough and shortness of breath tonight. Recent diagnosis of pulmonary embolus. EXAM: CHEST  2 VIEW COMPARISON:  Radiograph 03/02/2016, CT 10/03/2016 FINDINGS: Lower lung volumes from prior exam. Mild emphysema. Tortuous thoracic aorta with atherosclerosis. The heart is normal in size. No pulmonary edema. Minimal left infrahilar atelectasis. No confluent airspace disease, pleural effusion or pneumothorax. Advanced degenerative change of the left shoulder. IMPRESSION: Left lung base atelectasis. Thoracic aortic atherosclerosis. Electronically Signed   By: Rubye OaksMelanie  Ehinger M.D.   On: 10/11/2016 00:08   Dg Chest 2 View  Result Date: 09/29/2016 CLINICAL DATA: Chest pain EXAM: CHEST  2 VIEW COMPARISON:  September 01, 2016 FINDINGS: There is slight scarring in the right base. There is no edema or consolidation. Heart size and pulmonary vascularity are normal. No adenopathy. There is atherosclerotic calcification in the aorta. Aorta is tortuous but stable. There is arthropathy in each shoulder. IMPRESSION: No edema or consolidation. Slight scarring right base. Stable cardiac silhouette. There aorta is tortuous with aortic atherosclerosis. Electronically Signed   By: Bretta BangWilliam  Woodruff III M.D.   On: 09/29/2016 13:30   Ct Angio Chest Pe W And/or Wo Contrast  Result Date: 10/03/2016 CLINICAL DATA:  Increased difficulty breathing.  Short of breath. EXAM: CT ANGIOGRAPHY CHEST WITH CONTRAST TECHNIQUE: Multidetector CT imaging of the chest was performed using the standard protocol during bolus administration of intravenous  contrast. Multiplanar CT image reconstructions and MIPs were obtained to evaluate the vascular anatomy. CONTRAST:  75 mL Isovue COMPARISON:  Radiograph 10/02/2016 FINDINGS: Cardiovascular: A thin tubular filling defect within the LEFT upper lobe pulmonary artery (image 93, series 7). No additional pulmonary emboli. Overall clot burden is minimal. No RIGHT ventricular strain present. No pericardial fluid. No acute findings aorta great vessels. Mediastinum/Nodes: No axillary or supraclavicular lymphadenopathy. No mediastinal hilar adenopathy. No pericardial fluid. Lungs/Pleura: Centrilobular emphysema in the upper lobes. No pulmonary infarction. No airspace disease. No pleural fluid or pneumothorax Within the LEFT lower lobe 5 mm pulmonary nodule (image 32, series 8). Upper Abdomen: Limited view of the liver, kidneys, pancreas are unremarkable. Normal adrenal glands. Musculoskeletal: No aggressive osseous lesion. Review of the MIP images confirms the above findings. IMPRESSION: 1. Small acute pulmonary embolism within the LEFT upper lobe pulmonary artery. Overall clot burden is minimal. No RIGHT ventricular strain evident. 2. Coronary artery calcification and aortic atherosclerotic calcification. 3. **An incidental finding of potential clinical significance has been found. ** Small LEFT lower lobe pulmonary nodule. No follow-up needed if patient is low-risk. Non-contrast chest CT can be considered in 12 months if patient is high-risk. This recommendation follows the consensus statement: Guidelines for Management of Incidental Pulmonary Nodules Detected on CT Images: From the Fleischner Society 2017; Radiology 2017; 284:228-243. Critical Value/emergent results were called by telephone at the time of interpretation on 10/03/2016 at 5:47 pm to Dr. Linwood DibblesJON KNAPP , who verbally acknowledged these results. Electronically Signed   By: Genevive BiStewart  Edmunds M.D.   On: 10/03/2016 17:49   Ct Abdomen Pelvis W Contrast  Result Date:  09/30/2016 CLINICAL DATA:  History of iliopsoas hematoma, recent falls with left-sided pain, initial encounter EXAM: CT ABDOMEN  AND PELVIS WITH CONTRAST TECHNIQUE: Multidetector CT imaging of the abdomen and pelvis was performed using the standard protocol following bolus administration of intravenous contrast. CONTRAST:  75 mL Isovue-300 COMPARISON:  09/01/2016. FINDINGS: Lower chest: Bilateral lower lobe atelectasis. No sizable effusion is seen. Small hiatal hernia is noted. Hepatobiliary: No focal liver abnormality is seen. No gallstones, gallbladder wall thickening, or biliary dilatation. Pancreas: Unremarkable. No pancreatic ductal dilatation or surrounding inflammatory changes. Spleen: Multiple calcified granulomas are noted. The spleen is otherwise within normal limits. Adrenals/Urinary Tract: Renal vascular calcifications are noted. No calculi are seen. No obstructive changes are noted. The adrenal glands show mild fullness on the left likely related to hyperplasia. Stomach/Bowel: Diffuse diverticular change of the colon is noted. The appendix is within normal limits. No inflammatory changes are noted. Vascular/Lymphatic: Diffuse aortic calcifications are noted. There are changes consistent within infrarenal aortic aneurysm. It measures 6.8 x 6.4 cm in greatest AP and transverse dimensions respectively. Significant mural thrombus is identified no extravasation is identified. The aneurysm extends to the aortic bifurcation. Diffuse calcifications are noted throughout the iliac vessels without aneurysmal dilatation. Reproductive: Uterus and bilateral adnexa are unremarkable. Other: Left inguinal hernia is noted with multiple loops of small bowel within. No obstructive changes are seen. This is stable from the prior exam. Musculoskeletal: Degenerative changes of lumbar spine are seen. IMPRESSION: Mild bibasilar atelectatic changes without pleural effusion. Abdominal aortic aneurysm as described. Vascular  surgery consultation recommended due to increased risk of rupture for AAA >5.5 cm. This recommendation follows ACR consensus guidelines: White Paper of the ACR Incidental Findings Committee II on Vascular Findings. J Am Coll Radiol 2013; 10:789-794. Diverticulosis without diverticulitis. Left inguinal hernia containing small bowel loops without incarceration. Electronically Signed   By: Alcide Clever M.D.   On: 09/30/2016 15:56   Dg Chest Port 1 View  Result Date: 10/23/2016 CLINICAL DATA:  Shortness of Breath EXAM: PORTABLE CHEST 1 VIEW COMPARISON:  10/20/2016 FINDINGS: Cardiac shadow is stable. Aortic calcifications are again seen. Lungs are well aerated bilaterally. Interval clearing in the right base is noted. Some left basilar atelectasis is now seen however. Chronic degenerative change of the left shoulder joint is noted. No other focal abnormality is seen. IMPRESSION: Interval clearing of right basilar infiltrate. Left basilar atelectasis is now seen however. Electronically Signed   By: Alcide Clever M.D.   On: 10/23/2016 08:18   Dg Chest Port 1 View  Result Date: 10/20/2016 CLINICAL DATA:  Shortness of breath. EXAM: PORTABLE CHEST 1 VIEW COMPARISON:  10/18/2016.  10/10/2016 . FINDINGS: Mediastinum hilar structures normal. Heart size normal. Low lung volumes with basilar atelectasis . Right base infiltrate suggesting pneumonia. No acute bony abnormality identified. IMPRESSION: Right base infiltrate noted consistent pneumonia. Low lung volumes with mild bibasilar atelectasis. Electronically Signed   By: Maisie Fus  Register   On: 10/20/2016 11:00   Dg Chest Port 1 View  Result Date: 10/18/2016 CLINICAL DATA:  Dyspnea EXAM: PORTABLE CHEST 1 VIEW COMPARISON:  10/10/2016 CXR FINDINGS: The heart size and mediastinal contours are within normal limits. Aortic atherosclerosis is noted. Patchy airspace disease at the right lung base suspicious for pneumonia. Subsegmental can platelike atelectasis at the left lung  base. Osteoarthritis of the glenohumeral joints. IMPRESSION: Patchy new airspace opacity at the right lung base suspicious for pneumonia. Left lower lobe atelectasis. Electronically Signed   By: Tollie Eth M.D.   On: 10/18/2016 23:58   Dg Chest Port 1 View  Result Date: 10/02/2016 CLINICAL DATA:  Shortness  of breath. EXAM: PORTABLE CHEST 1 VIEW COMPARISON:  09/29/2016 FINDINGS: Lungs are hyperinflated. There is mild perihilar peribronchial thickening. No focal consolidations or pleural effusions are identified. Chronic changes are identified in the left shoulder. IMPRESSION: Hyperinflation and bronchitic changes. No focal acute pulmonary abnormality. Electronically Signed   By: Norva Pavlov M.D.   On: 10/02/2016 16:23    Micro Results     Recent Results (from the past 240 hour(s))  Culture, blood (routine x 2)     Status: None (Preliminary result)   Collection Time: 10/20/16 11:00 AM  Result Value Ref Range Status   Specimen Description BLOOD RIGHT HAND  Final   Special Requests BOTTLES DRAWN AEROBIC ONLY  Final   Culture NO GROWTH 2 DAYS  Final   Report Status PENDING  Incomplete  Culture, blood (routine x 2)     Status: None (Preliminary result)   Collection Time: 10/20/16 11:04 AM  Result Value Ref Range Status   Specimen Description BLOOD RIGHT ANTECUBITAL  Final   Special Requests BOTTLES DRAWN AEROBIC ONLY  Final   Culture NO GROWTH 2 DAYS  Final   Report Status PENDING  Incomplete    Today   Subjective    Sanjuana Mruk today has no headache,no chest abdominal pain,no new weakness tingling or numbness, feels much better wants to go home today.     Objective   Blood pressure (!) 124/45, pulse 84, temperature 97.8 F (36.6 C), temperature source Oral, resp. rate 18, height 5\' 3"  (1.6 m), weight 67.2 kg (148 lb 2.4 oz), SpO2 92 %.   Intake/Output Summary (Last 24 hours) at 10/23/16 1158 Last data filed at 10/23/16 0900  Gross per 24 hour  Intake             801.5 ml  Output              400 ml  Net            401.5 ml    Exam Awake Alert, Oriented x 3, No new F.N deficits, Normal affect .AT,PERRAL Supple Neck,No JVD, No cervical lymphadenopathy appriciated.  Symmetrical Chest wall movement, Good air movement bilaterally, CTAB RRR,No Gallops,Rubs or new Murmurs, No Parasternal Heave +ve B.Sounds, Abd Soft, Non tender, No organomegaly appriciated, No rebound -guarding or rigidity. No Cyanosis, Clubbing or edema, No new Rash or bruise   Data Review   CBC w Diff:  Lab Results  Component Value Date   WBC 7.9 10/23/2016   HGB 8.3 (L) 10/23/2016   HCT 26.3 (L) 10/23/2016   PLT 279 10/23/2016   LYMPHOPCT 24 10/10/2016   MONOPCT 8 10/10/2016   EOSPCT 0 10/10/2016   BASOPCT 0 10/10/2016    CMP:  Lab Results  Component Value Date   NA 138 10/23/2016   NA 136 (A) 09/29/2016   K 3.9 10/23/2016   CL 105 10/23/2016   CO2 26 10/23/2016   BUN 37 (H) 10/23/2016   BUN 14 09/29/2016   CREATININE 1.25 (H) 10/23/2016   GLU 85 09/29/2016   PROT 6.1 (L) 10/18/2016   ALBUMIN 2.5 (L) 10/18/2016   BILITOT 0.3 10/18/2016   ALKPHOS 55 10/18/2016   AST 18 10/18/2016   ALT 13 (L) 10/18/2016  .   Total Time in preparing paper work, data evaluation and todays exam - 35 minutes  Leroy Sea M.D on 10/23/2016 at 11:58 AM  [facility]   Office  [phone]

## 2016-10-23 NOTE — Clinical Social Work Note (Signed)
Clinical Social Worker notified pt is medically cleared to DC. Clinical Social Worker facilitated patient discharge including contacting patient family and facility to confirm patient discharge plans. Clinical information faxed to facility, ptis  from this facility. [facility] RN to call report prior to discharge [phone].  Clinical Social Worker will sign off for now as social work intervention is no longer needed. Please consult us again if new need arises.  Zahniya Zellars B. Gean QuintBrown,MSW, LCSWA Clinical Social Work Dept Weekend Social Worker [phone] 12:24 PM

## 2016-10-23 NOTE — Discharge Instructions (Addendum)
Follow with Primary MD Thayer Headings, MD in 7 days   Get CBC, CMP, 2 view Chest X ray checked  by Primary MD or SNF MD in 5-7 days ( we routinely change or add medications that can affect your baseline labs and fluid status, therefore we recommend that you get the mentioned basic workup next visit with your PCP, your PCP may decide not to get them or add new tests based on their clinical decision)   Activity: As tolerated with Full fall precautions use walker/cane & assistance as needed   Disposition Home     Diet:   Heart Healthy    For Heart failure patients - Check your Weight same time everyday, if you gain over 2 pounds, or you develop in leg swelling, experience more shortness of breath or chest pain, call your Primary MD immediately. Follow Cardiac Low Salt Diet and 1.5 lit/day fluid restriction.   On your next visit with your primary care physician please Get Medicines reviewed and adjusted.   Please request your Prim.MD to go over all Hospital Tests and Procedure/Radiological results at the follow up, please get all Hospital records sent to your Prim MD by signing hospital release before you go home.   If you experience worsening of your admission symptoms, develop shortness of breath, life threatening emergency, suicidal or homicidal thoughts you must seek medical attention immediately by calling 911 or calling your MD immediately  if symptoms less severe.  You Must read complete instructions/literature along with all the possible adverse reactions/side effects for all the Medicines you take and that have been prescribed to you. Take any new Medicines after you have completely understood and accpet all the possible adverse reactions/side effects.   Do not drive, operate heavy machinery, perform activities at heights, swimming or participation in water activities or provide baby sitting services if your were admitted for syncope or siezures until you have seen by Primary MD or a  Neurologist and advised to do so again.  Do not drive when taking Pain medications.    Do not take more than prescribed Pain, Sleep and Anxiety Medications  Special Instructions: If you have smoked or chewed Tobacco  in the last 2 yrs please stop smoking, stop any regular Alcohol  and or any Recreational drug use.  Wear Seat belts while driving.   Please note  You were cared for by a hospitalist during your hospital stay. If you have any questions about your discharge medications or the care you received while you were in the hospital after you are discharged, you can call the unit and asked to speak with the hospitalist on call if the hospitalist that took care of you is not available. Once you are discharged, your primary care physician will handle any further medical issues. Please note that NO REFILLS for any discharge medications will be authorized once you are discharged, as it is imperative that you return to your primary care physician (or establish a relationship with a primary care physician if you do not have one) for your aftercare needs so that they can reassess your need for medications and monitor your lab values.    Information on my medicine - XARELTO (rivaroxaban)  This medication education was reviewed with me or my healthcare representative as part of my discharge preparation.  The pharmacist that spoke with me during my hospital stay was:  Mosetta Anis, [facility]  WHY WAS Carrie Pena PRESCRIBED FOR YOU? Xarelto was prescribed to treat blood clots that may  have been found in the veins of your legs (deep vein thrombosis) or in your lungs (pulmonary embolism) and to reduce the risk of them occurring again.  What do you need to know about Xarelto? The starting dose is one 15 mg tablet taken TWICE daily with food for the FIRST 21 DAYS then on (enter date)  1/11  the dose is changed to one 20 mg tablet taken ONCE A DAY with your evening meal.  DO NOT stop taking Xarelto  without talking to the health care provider who prescribed the medication.  Refill your prescription for 20 mg tablets before you run out.  After discharge, you should have regular check-up appointments with your healthcare provider that is prescribing your Xarelto.  In the future your dose may need to be changed if your kidney function changes by a significant amount.  What do you do if you miss a dose? If you are taking Xarelto TWICE DAILY and you miss a dose, take it as soon as you remember. You may take two 15 mg tablets (total 30 mg) at the same time then resume your regularly scheduled 15 mg twice daily the next day.  If you are taking Xarelto ONCE DAILY and you miss a dose, take it as soon as you remember on the same day then continue your regularly scheduled once daily regimen the next day. Do not take two doses of Xarelto at the same time.   Important Safety Information Xarelto is a blood thinner medicine that can cause bleeding. You should call your healthcare provider right away if you experience any of the following: ? Bleeding from an injury or your nose that does not stop. ? Unusual colored urine (red or dark brown) or unusual colored stools (red or black). ? Unusual bruising for unknown reasons. ? A serious fall or if you hit your head (even if there is no bleeding).  Some medicines may interact with Xarelto and might increase your risk of bleeding while on Xarelto. To help avoid this, consult your healthcare provider or pharmacist prior to using any new prescription or non-prescription medications, including herbals, vitamins, non-steroidal anti-inflammatory drugs (NSAIDs) and supplements.  This website has more information on Xarelto: [email or web address].

## 2016-10-23 NOTE — Clinical Social Work Placement (Signed)
   CLINICAL SOCIAL WORK PLACEMENT  NOTE  Date:  10/23/2016  Patient Details  Name: Carrie Pena Abshier MRN: [id number] Date of Birth: 07-29-1928  Clinical Social Work is seeking post-discharge placement for this patient at the Skilled  Nursing Facility level of care (*CSW will initial, date and re-position this form in  chart as items are completed):  Yes   Patient/family provided with [place] Clinical Social Work Department's list of facilities offering this level of care within the geographic area requested by the patient (or if unable, by the patient's family).  Yes   Patient/family informed of their freedom to choose among providers that offer the needed level of care, that participate in Medicare, Medicaid or managed care program needed by the patient, have an available bed and are willing to accept the patient.  Yes   Patient/family informed of [facility]'s ownership interest [facility] and [facility], as well as of the fact that they are under no obligation to receive care at these facilities.  PASRR submitted to EDS on 09/30/16     PASRR number received on       Existing PASRR number confirmed on       FL2 transmitted to all facilities in geographic area requested by pt/family on 09/30/16     FL2 transmitted to all facilities within larger geographic area on       Patient informed that his/her managed care company has contracts with or will negotiate with certain facilities, including the following:        Yes   Patient/family informed of bed offers received.  Patient chooses bed at       Physician recommends and patient chooses bed at      Patient to be transferred to  [facility] on 10/23/16.  Patient to be transferred to facility by  Sharin Mons([facility])     Patient family notified on 10/23/16 of transfer.  Name of family member notified:        PHYSICIAN       Additional Comment:    _______________________________________________ Norlene DuelBROWN, Markevius Trombetta B,  LCSWA 10/23/2016, 3:49 PM

## 2016-10-24 ENCOUNTER — Encounter: Payer: Self-pay | Admitting: Adult Health

## 2016-10-24 ENCOUNTER — Non-Acute Institutional Stay (SKILLED_NURSING_FACILITY): Payer: Medicare Other | Admitting: Adult Health

## 2016-10-24 DIAGNOSIS — I2699 Other pulmonary embolism without acute cor pulmonale: Secondary | ICD-10-CM

## 2016-10-24 DIAGNOSIS — N179 Acute kidney failure, unspecified: Secondary | ICD-10-CM

## 2016-10-24 DIAGNOSIS — K5900 Constipation, unspecified: Secondary | ICD-10-CM | POA: Diagnosis not present

## 2016-10-24 DIAGNOSIS — R29898 Other symptoms and signs involving the musculoskeletal system: Secondary | ICD-10-CM

## 2016-10-24 DIAGNOSIS — D638 Anemia in other chronic diseases classified elsewhere: Secondary | ICD-10-CM

## 2016-10-24 DIAGNOSIS — I5032 Chronic diastolic (congestive) heart failure: Secondary | ICD-10-CM

## 2016-10-24 DIAGNOSIS — R5381 Other malaise: Secondary | ICD-10-CM | POA: Diagnosis not present

## 2016-10-24 DIAGNOSIS — J9621 Acute and chronic respiratory failure with hypoxia: Secondary | ICD-10-CM

## 2016-10-24 DIAGNOSIS — F419 Anxiety disorder, unspecified: Secondary | ICD-10-CM | POA: Diagnosis not present

## 2016-10-24 DIAGNOSIS — J441 Chronic obstructive pulmonary disease with (acute) exacerbation: Secondary | ICD-10-CM | POA: Diagnosis not present

## 2016-10-24 DIAGNOSIS — K219 Gastro-esophageal reflux disease without esophagitis: Secondary | ICD-10-CM | POA: Diagnosis not present

## 2016-10-24 DIAGNOSIS — M810 Age-related osteoporosis without current pathological fracture: Secondary | ICD-10-CM

## 2016-10-24 DIAGNOSIS — I10 Essential (primary) hypertension: Secondary | ICD-10-CM

## 2016-10-24 DIAGNOSIS — I739 Peripheral vascular disease, unspecified: Secondary | ICD-10-CM

## 2016-10-24 NOTE — Progress Notes (Signed)
DATE:  10/24/2016   MRN:  [id number]  BIRTHDAY: 1928/10/03  Facility:  Nursing Home Location:  [facility]  Nursing Home Room Number: 1104-A  LEVEL OF CARE:  SNF (31)  Contact Information    Name Relation Home Work Mobile   Ezernack,[name] Fairhope) Grandaughter   [phone]   Berna Bue [facility] Contact) Daughter [phone]  [phone]   Parthenia Ames [phone]         Code Status History    Date Active Date Inactive Code Status Order ID Comments User Context   10/18/2016 11:17 PM 10/23/2016  4:56 PM DNR [id number]  Clydie Braun, MD Inpatient   10/18/2016  8:36 PM 10/18/2016 11:17 PM DNR [id number]  Lyndal Pulley, MD ED   10/03/2016  7:04 PM 10/05/2016  7:50 PM DNR [id number]  Briscoe Deutscher, MD ED   09/29/2016  6:22 PM 10/03/2016  3:15 PM DNR [id number]  Narda Bonds, MD Inpatient   09/01/2016  8:12 PM 09/06/2016  5:52 PM DNR [id number]  Briscoe Deutscher, MD ED   08/12/2015  7:07 PM 08/15/2015  5:20 PM Full Code [id number]  Jeralyn Bennett, MD Inpatient    Questions for Most Recent Historical Code Status (Order [id number])    Question Answer Comment   In the event of cardiac or respiratory ARREST Do not call a "code blue"    In the event of cardiac or respiratory ARREST Do not perform Intubation, CPR, defibrillation or ACLS    In the event of cardiac or respiratory ARREST Use medication by any route, position, wound care, and other measures to relive pain and suffering. May use oxygen, suction and manual treatment of airway obstruction as needed for comfort.         Advance Directive Documentation   Flowsheet Row Most Recent Value  Type of Advance Directive  Out of facility DNR (pink MOST or yellow form)  Pre-existing out of facility DNR order (yellow form or pink MOST form)  No data  "MOST" Form in Place?  No data       Chief Complaint  Patient presents with  . Hospitalization Follow-up    HISTORY OF PRESENT ILLNESS:  This is an 88-YO  female readmitted to [facility] on 10/23/2016 for short-term rehabilitation after hospitalization at [facility] 10/18/16-10/23/16 for severe anemia requiring transfusion of 2 units of PRBC. She has PMH of COPD and a recent diagnosis of pulmonary embolism and was put on Xarelto.  EGD was not done due to worsening hypoxemia likely due to HCAP/COPD exacerbation.  She was seen today in her room. She did not voice any concerns.  PAST MEDICAL HISTORY:  Past Medical History:  Diagnosis Date  . Carpal tunnel syndrome   . COPD (chronic obstructive pulmonary disease) (HCC)   . GERD (gastroesophageal reflux disease)   . Hyperlipidemia   . Hypertension   . Osteoarthritis      CURRENT MEDICATIONS: Reviewed  Patient's Medications  New Prescriptions   No medications on file  Previous Medications   ALENDRONATE (FOSAMAX) 70 MG TABLET    Take 70 mg by mouth every Thursday. Take with a full glass of water on an empty stomach.    ALPRAZOLAM (XANAX) 0.25 MG TABLET    Take 0.25 mg by mouth every 8 (eight) hours as needed for anxiety.   ASPIRIN EC 81 MG TABLET    Take 81 mg by mouth daily.   ATORVASTATIN (LIPITOR) 20 MG TABLET    Take 20  mg by mouth daily at 6 PM.   BUDESONIDE-FORMOTEROL (SYMBICORT) 160-4.5 MCG/ACT INHALER    Inhale 2 puffs into the lungs 2 (two) times daily.   CILOSTAZOL (PLETAL) 100 MG TABLET    Take 100 mg by mouth 2 (two) times daily.    IPRATROPIUM-ALBUTEROL (DUONEB) 0.5-2.5 (3) MG/3ML SOLN    Take 3 mLs by nebulization every 4 (four) hours as needed (for wheezing/shortness of breath).   MENTHOL (ICY HOT) 5 % PTCH    Apply 1 application topically daily. Pt applies to left upper arm every morning and removes at bedtime.   OMEPRAZOLE (PRILOSEC) 20 MG CAPSULE    Take 1 capsule (20 mg total) by mouth daily.   OVER THE COUNTER MEDICATION    Take 120 mLs by mouth 2 (two) times daily. Med pass   OXYCODONE (OXY-IR) 5 MG CAPSULE    Take 5 mg by mouth every 6 (six) hours as needed for  pain.   OXYGEN    Inhale 2 L into the lungs daily as needed (for low O2 sats).   POLYETHYLENE GLYCOL (MIRALAX / GLYCOLAX) PACKET    Take 17 g by mouth 2 (two) times daily.   PREDNISONE (DELTASONE) 20 MG TABLET    Take 2 tablets (40 mg total) by mouth daily.   PROTEIN (PROCEL) POWD    Take 2 scoop by mouth 2 (two) times daily.   RIVAROXABAN (XARELTO) 15 MG TABS TABLET    Take 1 tablet (15 mg total) by mouth 2 (two) times daily with a meal.   SENNA-DOCUSATE (SENOKOT-S) 8.6-50 MG TABLET    Take 2 tablets by mouth 2 (two) times daily.  Modified Medications   No medications on file  Discontinued Medications   No medications on file     Allergies  Allergen Reactions  . Tylenol [Acetaminophen] Other (See Comments)    Reaction:  Shaking      REVIEW OF SYSTEMS:  GENERAL: no change in appetite, no fatigue, no weight changes, no fever, chills or weakness EYES: Denies change in vision, dry eyes, eye pain, itching or discharge EARS: Denies change in hearing, ringing in ears, or earache NOSE: Denies nasal congestion or epistaxis MOUTH and THROAT: Denies oral discomfort, gingival pain or bleeding, pain from teeth or hoarseness   RESPIRATORY: no cough, SOB, DOE, wheezing, hemoptysis CARDIAC: no chest pain, edema or palpitations GI: no abdominal pain, diarrhea, constipation, heart burn, nausea or vomiting GU: Denies dysuria, frequency, hematuria, incontinence, or discharge EXTREMITIES:  Able to move X 4 extremities PSYCHIATRIC: Denies feeling of depression or anxiety. No report of hallucinations, insomnia, paranoia, or agitation     PHYSICAL EXAMINATION  GENERAL APPEARANCE: Well nourished. In no acute distress. Normal body habitus SKIN:  Skin is warm and dry.  HEAD: Normal in size and contour. No evidence of trauma EYES: Lids open and close normally. No blepharitis, entropion or ectropion. PERRL. Conjunctivae are clear and sclerae are white. Lenses are without opacity EARS: Pinnae are  normal. Hard of hearing MOUTH and THROAT: Lips are without lesions. Oral mucosa is moist and without lesions. Tongue is normal in shape, size, and color and without lesions NECK: supple, trachea midline, no neck masses, no thyroid tenderness, no thyromegaly LYMPHATICS: no LAN in the neck, no supraclavicular LAN RESPIRATORY: breathing is even & unlabored, BS CTAB CARDIAC: RRR, no murmur,no extra heart sounds, no edema GI: abdomen soft, normal BS, no masses, no tenderness, no hepatomegaly, no splenomegaly EXTREMITIES:  Able to move X 4 extremities; BLE  generalized weakness PSYCHIATRIC: Alert and oriented X 3. Affect and behavior are appropriate  LABS/RADIOLOGY: Labs reviewed: Basic Metabolic Panel:  Recent Labs  45/40/98 0549 10/22/16 0627 10/23/16 0404  NA 135 137 138  K 4.2 3.7 3.9  CL 101 102 105  CO2 25 26 26   GLUCOSE 149* 148* 122*  BUN 32* 37* 37*  CREATININE 1.34* 1.43* 1.25*  CALCIUM 7.8* 7.5* 7.3*   Liver Function Tests:  Recent Labs  09/16/16 09/29/16 1419 10/18/16 1959  AST 15 27 18   ALT 14 14 13*  ALKPHOS 77 88 55  BILITOT  --  0.8 0.3  PROT  --  7.6 6.1*  ALBUMIN  --  3.1* 2.5*   CBC:  Recent Labs  09/27/16  09/29/16 1419  10/10/16 2353  10/21/16 0549 10/22/16 0627 10/23/16 0404  WBC 5.1  < > 5.6  < > 9.9  < > 8.6 7.6 7.9  NEUTROABS 4  --  4.7  --  6.7  --   --   --   --   HGB 8.8*  < > 9.6*  < > 8.1*  < > 8.9* 8.5* 8.3*  HCT 28*  < > 28.7*  < > 26.4*  < > 28.2* 26.8* 26.3*  MCV  --   --  102.5*  < > 109.1*  < > 97.6 95.4 96.7  PLT 265  < > 259  < > 367  < > 278 291 279  < > = values in this interval not displayed.  Cardiac Enzymes:  Recent Labs  09/01/16 1940 10/19/16 0455 10/19/16 1044  CKTOTAL 139  --   --   TROPONINI  --  <0.03 <0.03   CBG:  Recent Labs  09/06/16 0735 10/04/16 0748 10/05/16 0826  GLUCAP 98 79 125*      Dg Chest 2 View  Result Date: 10/11/2016 CLINICAL DATA:  Cough and shortness of breath tonight. Recent  diagnosis of pulmonary embolus. EXAM: CHEST  2 VIEW COMPARISON:  Radiograph 03/02/2016, CT 10/03/2016 FINDINGS: Lower lung volumes from prior exam. Mild emphysema. Tortuous thoracic aorta with atherosclerosis. The heart is normal in size. No pulmonary edema. Minimal left infrahilar atelectasis. No confluent airspace disease, pleural effusion or pneumothorax. Advanced degenerative change of the left shoulder. IMPRESSION: Left lung base atelectasis. Thoracic aortic atherosclerosis. Electronically Signed   By: Rubye Oaks M.D.   On: 10/11/2016 00:08   Dg Chest 2 View  Result Date: 09/29/2016 CLINICAL DATA: Chest pain EXAM: CHEST  2 VIEW COMPARISON:  September 01, 2016 FINDINGS: There is slight scarring in the right base. There is no edema or consolidation. Heart size and pulmonary vascularity are normal. No adenopathy. There is atherosclerotic calcification in the aorta. Aorta is tortuous but stable. There is arthropathy in each shoulder. IMPRESSION: No edema or consolidation. Slight scarring right base. Stable cardiac silhouette. There aorta is tortuous with aortic atherosclerosis. Electronically Signed   By: Bretta Bang III M.D.   On: 09/29/2016 13:30   Ct Angio Chest Pe W And/or Wo Contrast  Result Date: 10/03/2016 CLINICAL DATA:  Increased difficulty breathing.  Short of breath. EXAM: CT ANGIOGRAPHY CHEST WITH CONTRAST TECHNIQUE: Multidetector CT imaging of the chest was performed using the standard protocol during bolus administration of intravenous contrast. Multiplanar CT image reconstructions and MIPs were obtained to evaluate the vascular anatomy. CONTRAST:  75 mL Isovue COMPARISON:  Radiograph 10/02/2016 FINDINGS: Cardiovascular: A thin tubular filling defect within the LEFT upper lobe pulmonary artery (image 93, series 7).  No additional pulmonary emboli. Overall clot burden is minimal. No RIGHT ventricular strain present. No pericardial fluid. No acute findings aorta great vessels.  Mediastinum/Nodes: No axillary or supraclavicular lymphadenopathy. No mediastinal hilar adenopathy. No pericardial fluid. Lungs/Pleura: Centrilobular emphysema in the upper lobes. No pulmonary infarction. No airspace disease. No pleural fluid or pneumothorax Within the LEFT lower lobe 5 mm pulmonary nodule (image 32, series 8). Upper Abdomen: Limited view of the liver, kidneys, pancreas are unremarkable. Normal adrenal glands. Musculoskeletal: No aggressive osseous lesion. Review of the MIP images confirms the above findings. IMPRESSION: 1. Small acute pulmonary embolism within the LEFT upper lobe pulmonary artery. Overall clot burden is minimal. No RIGHT ventricular strain evident. 2. Coronary artery calcification and aortic atherosclerotic calcification. 3. **An incidental finding of potential clinical significance has been found. ** Small LEFT lower lobe pulmonary nodule. No follow-up needed if patient is low-risk. Non-contrast chest CT can be considered in 12 months if patient is high-risk. This recommendation follows the consensus statement: Guidelines for Management of Incidental Pulmonary Nodules Detected on CT Images: From the Fleischner Society 2017; Radiology 2017; 284:228-243. Critical Value/emergent results were called by telephone at the time of interpretation on 10/03/2016 at 5:47 pm to Dr. Linwood Dibbles , who verbally acknowledged these results. Electronically Signed   By: Genevive Bi M.D.   On: 10/03/2016 17:49   Ct Abdomen Pelvis W Contrast  Result Date: 09/30/2016 CLINICAL DATA:  History of iliopsoas hematoma, recent falls with left-sided pain, initial encounter EXAM: CT ABDOMEN AND PELVIS WITH CONTRAST TECHNIQUE: Multidetector CT imaging of the abdomen and pelvis was performed using the standard protocol following bolus administration of intravenous contrast. CONTRAST:  75 mL Isovue-300 COMPARISON:  09/01/2016. FINDINGS: Lower chest: Bilateral lower lobe atelectasis. No sizable effusion is  seen. Small hiatal hernia is noted. Hepatobiliary: No focal liver abnormality is seen. No gallstones, gallbladder wall thickening, or biliary dilatation. Pancreas: Unremarkable. No pancreatic ductal dilatation or surrounding inflammatory changes. Spleen: Multiple calcified granulomas are noted. The spleen is otherwise within normal limits. Adrenals/Urinary Tract: Renal vascular calcifications are noted. No calculi are seen. No obstructive changes are noted. The adrenal glands show mild fullness on the left likely related to hyperplasia. Stomach/Bowel: Diffuse diverticular change of the colon is noted. The appendix is within normal limits. No inflammatory changes are noted. Vascular/Lymphatic: Diffuse aortic calcifications are noted. There are changes consistent within infrarenal aortic aneurysm. It measures 6.8 x 6.4 cm in greatest AP and transverse dimensions respectively. Significant mural thrombus is identified no extravasation is identified. The aneurysm extends to the aortic bifurcation. Diffuse calcifications are noted throughout the iliac vessels without aneurysmal dilatation. Reproductive: Uterus and bilateral adnexa are unremarkable. Other: Left inguinal hernia is noted with multiple loops of small bowel within. No obstructive changes are seen. This is stable from the prior exam. Musculoskeletal: Degenerative changes of lumbar spine are seen. IMPRESSION: Mild bibasilar atelectatic changes without pleural effusion. Abdominal aortic aneurysm as described. Vascular surgery consultation recommended due to increased risk of rupture for AAA >5.5 cm. This recommendation follows ACR consensus guidelines: White Paper of the ACR Incidental Findings Committee II on Vascular Findings. J Am Coll Radiol 2013; 10:789-794. Diverticulosis without diverticulitis. Left inguinal hernia containing small bowel loops without incarceration. Electronically Signed   By: Alcide Clever M.D.   On: 09/30/2016 15:56   Dg Chest Port 1  View  Result Date: 10/23/2016 CLINICAL DATA:  Shortness of Breath EXAM: PORTABLE CHEST 1 VIEW COMPARISON:  10/20/2016 FINDINGS: Cardiac shadow is stable. Aortic  calcifications are again seen. Lungs are well aerated bilaterally. Interval clearing in the right base is noted. Some left basilar atelectasis is now seen however. Chronic degenerative change of the left shoulder joint is noted. No other focal abnormality is seen. IMPRESSION: Interval clearing of right basilar infiltrate. Left basilar atelectasis is now seen however. Electronically Signed   By: Alcide CleverMark  Lukens M.D.   On: 10/23/2016 08:18   Dg Chest Port 1 View  Result Date: 10/20/2016 CLINICAL DATA:  Shortness of breath. EXAM: PORTABLE CHEST 1 VIEW COMPARISON:  10/18/2016.  10/10/2016 . FINDINGS: Mediastinum hilar structures normal. Heart size normal. Low lung volumes with basilar atelectasis . Right base infiltrate suggesting pneumonia. No acute bony abnormality identified. IMPRESSION: Right base infiltrate noted consistent pneumonia. Low lung volumes with mild bibasilar atelectasis. Electronically Signed   By: Maisie Fushomas  Register   On: 10/20/2016 11:00   Dg Chest Port 1 View  Result Date: 10/18/2016 CLINICAL DATA:  Dyspnea EXAM: PORTABLE CHEST 1 VIEW COMPARISON:  10/10/2016 CXR FINDINGS: The heart size and mediastinal contours are within normal limits. Aortic atherosclerosis is noted. Patchy airspace disease at the right lung base suspicious for pneumonia. Subsegmental can platelike atelectasis at the left lung base. Osteoarthritis of the glenohumeral joints. IMPRESSION: Patchy new airspace opacity at the right lung base suspicious for pneumonia. Left lower lobe atelectasis. Electronically Signed   By: Tollie Ethavid  Kwon M.D.   On: 10/18/2016 23:58   Dg Chest Port 1 View  Result Date: 10/02/2016 CLINICAL DATA:  Shortness of breath. EXAM: PORTABLE CHEST 1 VIEW COMPARISON:  09/29/2016 FINDINGS: Lungs are hyperinflated. There is mild perihilar peribronchial  thickening. No focal consolidations or pleural effusions are identified. Chronic changes are identified in the left shoulder. IMPRESSION: Hyperinflation and bronchitic changes. No focal acute pulmonary abnormality. Electronically Signed   By: Norva PavlovElizabeth  Brown M.D.   On: 10/02/2016 16:23    ASSESSMENT/PLAN:  Physical deconditioning - for rehabilitation, PT and OT, for therapeutic strengthening exercises; fall precautions  Weakness of bilateral lower extremity - for rehabilitation, PT and OT, for therapeutic distended exercises  Anemia of chronic disease - was thought to be due to nosebleed;  GI was consulted; she was able to tolerate IV heparin then Xarelto without any further bleeding; check CBC  Acute on chronic respiratory failure with hypoxia - had a recent PE worsened by combination of atelectasis and possibly mild COPD exacerbation; continue O2 at 2L/minute via [place]  Mild COPD exacerbation - no wheezing no SOB at this time; discontinue steroids; continue O2 at 2 L/minute via [place], Symbicort 2 puffs into lungs twice a day, DuoNeb every 4 hours when necessary  Chronic diastolic CHF - no edema noted; Lasix currently on hold due to AKI  AKI -  GFR 38 ; check BMP Lab Results  Component Value Date   CREATININE 1.25 (H) 10/23/2016   History of PVD - continue cilostazol 100 mg 1 tab by mouth twice a day  Hyperlipidemia - continue Lipitor 20 mg 1 tab by mouth every 6 p.m.  GERD - continue Prilosec 20 mg 1 tab by mouth daily  Hypertension - stable; off BP medications; BP twice a day 1 week  Anxiety - mood this is stable; continue Xanax 0.25 mg 1 tab by mouth every 8 hours when necessary 14 days then reevaluate if needed  Acute pulmonary embolism - continue Xarelto 15 mg 1 tab by mouth twice a day  Constipation - continue MiraLAX 17 g by mouth twice a day and senna S  2 tabs by mouth twice a day  Osteoporosis - continue Fosamax 70 mg 1 tab by mouth every week      Goals of care:   Short-term rehabilitation    Desire Fulp C. Medina-Vargas  -  NP [employer] [phone]

## 2016-10-25 ENCOUNTER — Encounter: Payer: Self-pay | Admitting: Internal Medicine

## 2016-10-25 ENCOUNTER — Non-Acute Institutional Stay (SKILLED_NURSING_FACILITY): Payer: Medicare Other | Admitting: Internal Medicine

## 2016-10-25 DIAGNOSIS — I2699 Other pulmonary embolism without acute cor pulmonale: Secondary | ICD-10-CM | POA: Diagnosis not present

## 2016-10-25 DIAGNOSIS — J441 Chronic obstructive pulmonary disease with (acute) exacerbation: Secondary | ICD-10-CM | POA: Diagnosis not present

## 2016-10-25 DIAGNOSIS — K219 Gastro-esophageal reflux disease without esophagitis: Secondary | ICD-10-CM

## 2016-10-25 DIAGNOSIS — R531 Weakness: Secondary | ICD-10-CM | POA: Diagnosis not present

## 2016-10-25 DIAGNOSIS — J189 Pneumonia, unspecified organism: Secondary | ICD-10-CM | POA: Diagnosis not present

## 2016-10-25 DIAGNOSIS — D62 Acute posthemorrhagic anemia: Secondary | ICD-10-CM

## 2016-10-25 DIAGNOSIS — K5909 Other constipation: Secondary | ICD-10-CM

## 2016-10-25 DIAGNOSIS — I5032 Chronic diastolic (congestive) heart failure: Secondary | ICD-10-CM

## 2016-10-25 DIAGNOSIS — I739 Peripheral vascular disease, unspecified: Secondary | ICD-10-CM

## 2016-10-25 LAB — CULTURE, BLOOD (ROUTINE X 2)
CULTURE: NO GROWTH
Culture: NO GROWTH

## 2016-10-25 NOTE — Progress Notes (Signed)
LOCATION: [facility]  PCP: Thayer Headings, MD   Code Status: DNR  Goals of care: Advanced Directive information Advanced Directives 10/24/2016  Does Patient Have a Medical Advance Directive? Yes  Type of Advance Directive Out of facility DNR (pink MOST or yellow form)  Does patient want to make changes to medical advance directive? No - Patient declined  Copy of Healthcare Power of Attorney in Chart? No - copy requested  Would patient like information on creating a medical advance directive? -  Pre-existing out of facility DNR order (yellow form or pink MOST form) -  Some encounter information is confidential and restricted. Go to Review Flowsheets activity to see all data.       Extended Emergency Contact Information Primary Emergency Contact: Haywood Filler ([place])  Darden Amber of [place] Phone: [phone] Relation: Grandaughter Secondary Emergency Contact: Berna Bue [facility]) Address: [address]          [place], [place] [address] Darden Amber of [place] Home Phone: [phone] Mobile Phone: [phone] Relation: Daughter   Allergies  Allergen Reactions  . Tylenol [Acetaminophen] Other (See Comments)    Reaction:  Shaking     Chief Complaint  Patient presents with  . Readmit To SNF    Readmission Visit      HPI:  Patient is a 83 y.o. female seen today for short term rehabilitation post hospital re-admission from 10/18/2016-separates January 2013 5 acute on chronic respiratory failure with hypoxia and acute blood loss anemia. She was treated for healthcare acquired pneumonia and COPD exacerbation. She required 2 units of packed red blood cell transfusion. GI was consulted and plan was to get EGD. Because of her hypoxic episode, this had to be postponed. He to her bleeding was thought to be from epistaxis and being on Xarelto. Thus, no EGD was performed.She has medical history of COPD on oxygen, new pulmonary embolism, CHF, chronic  respiratory failure among others. She is seen in her room today.  Review of Systems:  Constitutional: Negative for fever, chills, diaphoresis.  HENT: Negative for headache, congestion, nasal discharge, sore throat, difficulty swallowing.   Eyes: Negative for blurred vision, double vision and discharge.  Wears glasses. Respiratory: Negative for cough, shortness of breath and wheezing.   Cardiovascular: Negative for chest pain, palpitations, leg swelling.  Gastrointestinal: Negative for heartburn, nausea, vomiting, abdominal pain. Last bowel movement was this morning. Genitourinary: Negative for dysuria and flank pain.  Musculoskeletal: Negative for back pain, fall in the facility.  Skin: Negative for itching, rash.  Neurological: Negative for dizziness. Psychiatric/Behavioral: Negative for depression   Past Medical History:  Diagnosis Date  . Carpal tunnel syndrome   . COPD (chronic obstructive pulmonary disease) (HCC)   . GERD (gastroesophageal reflux disease)   . Hyperlipidemia   . Hypertension   . Osteoarthritis    Past Surgical History:  Procedure Laterality Date  . KNEE ARTHROSCOPY    . left carpal tunnel    . TONSILLECTOMY     Social History:   reports that she quit smoking about 19 months ago. Her smoking use included Cigarettes. She smoked 0.50 packs per day. She has never used smokeless tobacco. She reports that she does not drink alcohol or use drugs.  Family History  Problem Relation Age of Onset  . Cancer Mother   . Cancer Father   . Hypertension Daughter   . Heart disease Daughter     before age 80    Medications: Allergies as of 10/25/2016  Reactions   Tylenol [acetaminophen] Other (See Comments)   Reaction:  Shaking       Medication List       Accurate as of 10/25/16  2:32 PM. Always use your most recent med list.          alendronate 70 MG tablet Commonly known as:  FOSAMAX Take 70 mg by mouth every Thursday. Take with a full glass of water  on an empty stomach.   ALPRAZolam 0.25 MG tablet Commonly known as:  XANAX Take 0.25 mg by mouth every 8 (eight) hours as needed for anxiety.   atorvastatin 20 MG tablet Commonly known as:  LIPITOR Take 20 mg by mouth daily at 6 PM.   budesonide-formoterol 160-4.5 MCG/ACT inhaler Commonly known as:  SYMBICORT Inhale 2 puffs into the lungs 2 (two) times daily.   cilostazol 100 MG tablet Commonly known as:  PLETAL Take 100 mg by mouth 2 (two) times daily.   ICY HOT 5 % Ptch Generic drug:  Menthol Apply 1 application topically daily. Pt applies to left upper arm every morning and removes at bedtime.   ipratropium-albuterol 0.5-2.5 (3) MG/3ML Soln Commonly known as:  DUONEB Take 3 mLs by nebulization every 4 (four) hours as needed (for wheezing/shortness of breath).   omeprazole 20 MG capsule Commonly known as:  PRILOSEC Take 1 capsule (20 mg total) by mouth daily.   OVER THE COUNTER MEDICATION Take 120 mLs by mouth 2 (two) times daily. Med pass   oxycodone 5 MG capsule Commonly known as:  OXY-IR Take 5 mg by mouth every 6 (six) hours as needed for pain.   OXYGEN Inhale 2 L into the lungs daily as needed (for low O2 sats).   polyethylene glycol packet Commonly known as:  MIRALAX / GLYCOLAX Take 17 g by mouth 2 (two) times daily.   PROCEL Powd Take 2 scoop by mouth 2 (two) times daily.   Rivaroxaban 15 MG Tabs tablet Commonly known as:  XARELTO Take 1 tablet (15 mg total) by mouth 2 (two) times daily with a meal.   senna-docusate 8.6-50 MG tablet Commonly known as:  Senokot-S Take 2 tablets by mouth 2 (two) times daily.       Immunizations:  There is no immunization history on file for this patient.   Physical Exam: Vitals:   10/25/16 1426  BP: 132/74  Pulse: 70  Resp: 20  Temp: 98.8 F (37.1 C)  TempSrc: Oral  SpO2: 96%  Weight: 146 lb (66.2 kg)  Height: 5\' 2"  (1.575 m)   Body mass index is 26.7 kg/m.  General- elderly female, well built, in  no acute distress Head- normocephalic, atraumatic Nose- no maxillary or frontal sinus tenderness, no nasal discharge Throat- moist mucus membrane Eyes- PERRLA, EOMI, no pallor, no icterus, no discharge, normal conjunctiva, normal sclera Neck- no cervical lymphadenopathy Cardiovascular- normal s1,s2, no murmur Respiratory- CTAB, no wheezing, no rhonchi, no crackles, no use of accessory muscles, on 4 L oxygen by nasal cannula Abdomen- bowel sounds present, soft, non tender Musculoskeletal- able to move all 4 extremities, generalized weakness, trace leg edema, limited range of motion to her left shoulder, weakness most prominent in her legs Neurological- alert and oriented to person, place and time Skin- warm and dry Psychiatry- normal mood and affect    Labs reviewed: Basic Metabolic Panel:  Recent Labs  78/29/56 0549 10/22/16 0627 10/23/16 0404  NA 135 137 138  K 4.2 3.7 3.9  CL 101 102 105  CO2  25 26 26   GLUCOSE 149* 148* 122*  BUN 32* 37* 37*  CREATININE 1.34* 1.43* 1.25*  CALCIUM 7.8* 7.5* 7.3*   Liver Function Tests:  Recent Labs  09/16/16 09/29/16 1419 10/18/16 1959  AST 15 27 18   ALT 14 14 13*  ALKPHOS 77 88 55  BILITOT  --  0.8 0.3  PROT  --  7.6 6.1*  ALBUMIN  --  3.1* 2.5*   No results for input(s): LIPASE, AMYLASE in the last 8760 hours. No results for input(s): AMMONIA in the last 8760 hours. CBC:  Recent Labs  09/27/16  09/29/16 1419  10/10/16 2353  10/21/16 0549 10/22/16 0627 10/23/16 0404  WBC 5.1  < > 5.6  < > 9.9  < > 8.6 7.6 7.9  NEUTROABS 4  --  4.7  --  6.7  --   --   --   --   HGB 8.8*  < > 9.6*  < > 8.1*  < > 8.9* 8.5* 8.3*  HCT 28*  < > 28.7*  < > 26.4*  < > 28.2* 26.8* 26.3*  MCV  --   --  102.5*  < > 109.1*  < > 97.6 95.4 96.7  PLT 265  < > 259  < > 367  < > 278 291 279  < > = values in this interval not displayed. Cardiac Enzymes:  Recent Labs  09/01/16 1940 10/19/16 0455 10/19/16 1044  CKTOTAL 139  --   --   TROPONINI   --  <0.03 <0.03   BNP: Invalid input(s): POCBNP CBG:  Recent Labs  09/06/16 0735 10/04/16 0748 10/05/16 0826  GLUCAP 98 79 125*    Radiological Exams: Dg Chest 2 View  Result Date: 10/11/2016 CLINICAL DATA:  Cough and shortness of breath tonight. Recent diagnosis of pulmonary embolus. EXAM: CHEST  2 VIEW COMPARISON:  Radiograph 03/02/2016, CT 10/03/2016 FINDINGS: Lower lung volumes from prior exam. Mild emphysema. Tortuous thoracic aorta with atherosclerosis. The heart is normal in size. No pulmonary edema. Minimal left infrahilar atelectasis. No confluent airspace disease, pleural effusion or pneumothorax. Advanced degenerative change of the left shoulder. IMPRESSION: Left lung base atelectasis. Thoracic aortic atherosclerosis. Electronically Signed   By: Rubye Oaks M.D.   On: 10/11/2016 00:08   Dg Chest 2 View  Result Date: 09/29/2016 CLINICAL DATA: Chest pain EXAM: CHEST  2 VIEW COMPARISON:  September 01, 2016 FINDINGS: There is slight scarring in the right base. There is no edema or consolidation. Heart size and pulmonary vascularity are normal. No adenopathy. There is atherosclerotic calcification in the aorta. Aorta is tortuous but stable. There is arthropathy in each shoulder. IMPRESSION: No edema or consolidation. Slight scarring right base. Stable cardiac silhouette. There aorta is tortuous with aortic atherosclerosis. Electronically Signed   By: Bretta Bang III M.D.   On: 09/29/2016 13:30   Ct Angio Chest Pe W And/or Wo Contrast  Result Date: 10/03/2016 CLINICAL DATA:  Increased difficulty breathing.  Short of breath. EXAM: CT ANGIOGRAPHY CHEST WITH CONTRAST TECHNIQUE: Multidetector CT imaging of the chest was performed using the standard protocol during bolus administration of intravenous contrast. Multiplanar CT image reconstructions and MIPs were obtained to evaluate the vascular anatomy. CONTRAST:  75 mL Isovue COMPARISON:  Radiograph 10/02/2016 FINDINGS:  Cardiovascular: A thin tubular filling defect within the LEFT upper lobe pulmonary artery (image 93, series 7). No additional pulmonary emboli. Overall clot burden is minimal. No RIGHT ventricular strain present. No pericardial fluid. No acute findings aorta great  vessels. Mediastinum/Nodes: No axillary or supraclavicular lymphadenopathy. No mediastinal hilar adenopathy. No pericardial fluid. Lungs/Pleura: Centrilobular emphysema in the upper lobes. No pulmonary infarction. No airspace disease. No pleural fluid or pneumothorax Within the LEFT lower lobe 5 mm pulmonary nodule (image 32, series 8). Upper Abdomen: Limited view of the liver, kidneys, pancreas are unremarkable. Normal adrenal glands. Musculoskeletal: No aggressive osseous lesion. Review of the MIP images confirms the above findings. IMPRESSION: 1. Small acute pulmonary embolism within the LEFT upper lobe pulmonary artery. Overall clot burden is minimal. No RIGHT ventricular strain evident. 2. Coronary artery calcification and aortic atherosclerotic calcification. 3. **An incidental finding of potential clinical significance has been found. ** Small LEFT lower lobe pulmonary nodule. No follow-up needed if patient is low-risk. Non-contrast chest CT can be considered in 12 months if patient is high-risk. This recommendation follows the consensus statement: Guidelines for Management of Incidental Pulmonary Nodules Detected on CT Images: From the Fleischner Society 2017; Radiology 2017; 284:228-243. Critical Value/emergent results were called by telephone at the time of interpretation on 10/03/2016 at 5:47 pm to Dr. Linwood Dibbles , who verbally acknowledged these results. Electronically Signed   By: Genevive Bi M.D.   On: 10/03/2016 17:49   Ct Abdomen Pelvis W Contrast  Result Date: 09/30/2016 CLINICAL DATA:  History of iliopsoas hematoma, recent falls with left-sided pain, initial encounter EXAM: CT ABDOMEN AND PELVIS WITH CONTRAST TECHNIQUE:  Multidetector CT imaging of the abdomen and pelvis was performed using the standard protocol following bolus administration of intravenous contrast. CONTRAST:  75 mL Isovue-300 COMPARISON:  09/01/2016. FINDINGS: Lower chest: Bilateral lower lobe atelectasis. No sizable effusion is seen. Small hiatal hernia is noted. Hepatobiliary: No focal liver abnormality is seen. No gallstones, gallbladder wall thickening, or biliary dilatation. Pancreas: Unremarkable. No pancreatic ductal dilatation or surrounding inflammatory changes. Spleen: Multiple calcified granulomas are noted. The spleen is otherwise within normal limits. Adrenals/Urinary Tract: Renal vascular calcifications are noted. No calculi are seen. No obstructive changes are noted. The adrenal glands show mild fullness on the left likely related to hyperplasia. Stomach/Bowel: Diffuse diverticular change of the colon is noted. The appendix is within normal limits. No inflammatory changes are noted. Vascular/Lymphatic: Diffuse aortic calcifications are noted. There are changes consistent within infrarenal aortic aneurysm. It measures 6.8 x 6.4 cm in greatest AP and transverse dimensions respectively. Significant mural thrombus is identified no extravasation is identified. The aneurysm extends to the aortic bifurcation. Diffuse calcifications are noted throughout the iliac vessels without aneurysmal dilatation. Reproductive: Uterus and bilateral adnexa are unremarkable. Other: Left inguinal hernia is noted with multiple loops of small bowel within. No obstructive changes are seen. This is stable from the prior exam. Musculoskeletal: Degenerative changes of lumbar spine are seen. IMPRESSION: Mild bibasilar atelectatic changes without pleural effusion. Abdominal aortic aneurysm as described. Vascular surgery consultation recommended due to increased risk of rupture for AAA >5.5 cm. This recommendation follows ACR consensus guidelines: White Paper of the ACR Incidental  Findings Committee II on Vascular Findings. J Am Coll Radiol 2013; 10:789-794. Diverticulosis without diverticulitis. Left inguinal hernia containing small bowel loops without incarceration. Electronically Signed   By: Alcide Clever M.D.   On: 09/30/2016 15:56   Dg Chest Port 1 View  Result Date: 10/23/2016 CLINICAL DATA:  Shortness of Breath EXAM: PORTABLE CHEST 1 VIEW COMPARISON:  10/20/2016 FINDINGS: Cardiac shadow is stable. Aortic calcifications are again seen. Lungs are well aerated bilaterally. Interval clearing in the right base is noted. Some left basilar atelectasis is now  seen however. Chronic degenerative change of the left shoulder joint is noted. No other focal abnormality is seen. IMPRESSION: Interval clearing of right basilar infiltrate. Left basilar atelectasis is now seen however. Electronically Signed   By: Alcide CleverMark  Lukens M.D.   On: 10/23/2016 08:18   Dg Chest Port 1 View  Result Date: 10/20/2016 CLINICAL DATA:  Shortness of breath. EXAM: PORTABLE CHEST 1 VIEW COMPARISON:  10/18/2016.  10/10/2016 . FINDINGS: Mediastinum hilar structures normal. Heart size normal. Low lung volumes with basilar atelectasis . Right base infiltrate suggesting pneumonia. No acute bony abnormality identified. IMPRESSION: Right base infiltrate noted consistent pneumonia. Low lung volumes with mild bibasilar atelectasis. Electronically Signed   By: Maisie Fushomas  Register   On: 10/20/2016 11:00   Dg Chest Port 1 View  Result Date: 10/18/2016 CLINICAL DATA:  Dyspnea EXAM: PORTABLE CHEST 1 VIEW COMPARISON:  10/10/2016 CXR FINDINGS: The heart size and mediastinal contours are within normal limits. Aortic atherosclerosis is noted. Patchy airspace disease at the right lung base suspicious for pneumonia. Subsegmental can platelike atelectasis at the left lung base. Osteoarthritis of the glenohumeral joints. IMPRESSION: Patchy new airspace opacity at the right lung base suspicious for pneumonia. Left lower lobe atelectasis.  Electronically Signed   By: Tollie Ethavid  Kwon M.D.   On: 10/18/2016 23:58   Dg Chest Port 1 View  Result Date: 10/02/2016 CLINICAL DATA:  Shortness of breath. EXAM: PORTABLE CHEST 1 VIEW COMPARISON:  09/29/2016 FINDINGS: Lungs are hyperinflated. There is mild perihilar peribronchial thickening. No focal consolidations or pleural effusions are identified. Chronic changes are identified in the left shoulder. IMPRESSION: Hyperinflation and bronchitic changes. No focal acute pulmonary abnormality. Electronically Signed   By: Norva PavlovElizabeth  Brown M.D.   On: 10/02/2016 16:23    Assessment/Plan  Generalized weakness Will need for her to work with physical therapy and occupational therapy team to help regain her strength and her balance.  Acute blood loss anemia From bleeding from epistaxis and being on Xarelto. Status post 2 unit packed red blood cell transfusion. Monitor CBC.  COPD exacerbation Status post IV steroids and nebulizer treatment. Continue oxygen by nasal cannula, Symbicort, when necessary DuoNeb. Has completed her antibiotic course. Monitor clinically  Healthcare acquired pneumonia Status post antibiotics and breathing treatment. Breathing is currently stable. Continue oxygen by nasal cannula and monitor.  Acute pulmonary embolism Continue oxygen by nasal cannula. Continue xarelto 15 mg twice a day until 10/26/2016 and then will have her on xarelto 20 mg daily.   PAD Continue cilostazol and aspirin ec 81 mg daily  Constipation Currently on MiraLAX twice a day and senna S 2 tabs twice a day. Hydration to be encouraged.  Chronic diastolic congestive heart failure Stable at present. Echocardiogram showed EF of 60%. Off all medications at present  Gastroesophageal reflux disease Symptoms are controlled. Continue her omeprazole.     Goals of care: short term rehabilitation   Labs/tests ordered: cbc, bmp  Family/ staff Communication: reviewed care plan with patient and nursing  supervisor    Oneal GroutMAHIMA Carmelo Reidel, MD Internal Medicine [facility] [address] [place], [place] [address] Cell Phone (Monday-Friday 8 am - 5 pm): [phone] On Call: [phone] and follow prompts after 5 pm and on weekends Office Phone: [phone] Office Fax: [phone]

## 2016-10-28 ENCOUNTER — Ambulatory Visit: Payer: Self-pay | Admitting: *Deleted

## 2016-10-31 ENCOUNTER — Other Ambulatory Visit: Payer: Self-pay | Admitting: *Deleted

## 2016-11-01 ENCOUNTER — Encounter: Payer: Self-pay | Admitting: Vascular Surgery

## 2016-11-01 ENCOUNTER — Encounter: Payer: Self-pay | Admitting: *Deleted

## 2016-11-01 NOTE — Patient Outreach (Signed)
[facility] Care Management  11/01/2016  Marsh DollyJeanne R Frakes [birth year] [id number]   Request received from Danford BadJoanna Saporito to mail Medicaid application to patient's granddaughter Malachi BondsMelissa Ezermack at [address], [place], [place] [phone].   Wynona CanesNicole Azai Gaffin, B.A.  [facility] Care Management Assistant

## 2016-11-01 NOTE — Patient Outreach (Signed)
[facility] Care Management  11/01/2016  Carrie Pena Carrie Pena Carrie Pena   CSW was able to [facility] with [facility] patient currently resides to receive short-term rehabilitative services, today to perform a routine visit.  Patient was not feeling well today, reporting that there is a stomach bug going around the facility, encouraging CSW to wear a mask and wash hands thoroughly.  CSW voiced appreciation, as none of the staff members at [facility] indicated that there was an illness going around.  Patient went on to say that her daughter, Carrie Pena is at home suffering from the stomach bug, after having visited with patient over the weekend.  Patient is still talking of returning home to live at [facility]; however, Mrs. Carrie Pena reports otherwise.  CSW had a long conversation with Mrs. Carrie Pena prior to visiting with patient today and she reports that there is "no way possible that Carrie Pena will be able to return home to live by herself".  Mrs. Carrie Pena indicated that patient has become increasingly confused, is extremely weak and deconditioned, not even able to stand independently, let alone perform activities of daily living without maximum assistance.  Mrs. Carrie Pena went on to say that patient was recently diagnosed with a stomach aneurysm, which is causing the pain and swelling in patient's abdominal area.  Mrs. Carrie Pena believes the aneurysm is a result of patient's most recent fall in the home.   CSW is aware that patient has been in and out of the hospital for various medical concerns, just while residing at [facility].  And, after thorough review of patient's EMR (Electronic Medical Record) in [employer], CSW noted that patient currently suffers from the following diagnoses: Acute Pulmonary Embolism, Healthcare Acquired Pneumonia, Chronic Obstructive Pulmonary Disease Exacerbation, Acute Blood Loss  Anemia, Chronic Diastolic Congestive Heart Failure, just to name a few.  CSW explained to Mrs. Carrie Pena that CSW will speak with Carrie Pena, Admissions Director at [facility], to encourage long-term care placement for patient [facility], as they currently have long-term care female Medicaid beds available.  CSW has also agreed to complete a Long-Term Care Medicaid application for patient and submit to the [place] [facility] of Social [place] for processing. Mrs. Carrie Pena reports that she is awaiting a call from [facility] with regards to whether or not they will be sending patient [facility] today to receive a blood transfusion.  Mrs. Carrie Pena agreed to keep CSW posted, as CSW agreed to do the same.  CSW will make arrangements to meet with patient for the next routine visit on Tuesday, January 30th.   Carrie Pena, BSW, MSW, LCSW  Licensed Restaurant manager, fast foodClinical Social Worker  [facility] Care [facility]  Mailing [address]Address-[address], [place] [address] Physical Address-[address], [place], [place] [address] Toll Free Main # [phone] Fax # [phone] Cell # [phone]  Fax # [phone]  [email or web address]

## 2016-11-02 ENCOUNTER — Emergency Department (HOSPITAL_COMMUNITY): Payer: Medicare Other

## 2016-11-02 ENCOUNTER — Inpatient Hospital Stay (HOSPITAL_COMMUNITY)
Admission: EM | Admit: 2016-11-02 | Discharge: 2016-11-13 | DRG: 871 | Disposition: A | Discharge status: Hospice | Payer: Medicare Other | Attending: Internal Medicine | Admitting: Internal Medicine

## 2016-11-02 ENCOUNTER — Encounter (HOSPITAL_COMMUNITY): Payer: Self-pay | Admitting: Family Medicine

## 2016-11-02 DIAGNOSIS — Z515 Encounter for palliative care: Secondary | ICD-10-CM | POA: Diagnosis not present

## 2016-11-02 DIAGNOSIS — J441 Chronic obstructive pulmonary disease with (acute) exacerbation: Secondary | ICD-10-CM | POA: Diagnosis present

## 2016-11-02 DIAGNOSIS — J111 Influenza due to unidentified influenza virus with other respiratory manifestations: Secondary | ICD-10-CM | POA: Diagnosis present

## 2016-11-02 DIAGNOSIS — K922 Gastrointestinal hemorrhage, unspecified: Secondary | ICD-10-CM | POA: Diagnosis not present

## 2016-11-02 DIAGNOSIS — Z7189 Other specified counseling: Secondary | ICD-10-CM

## 2016-11-02 DIAGNOSIS — E876 Hypokalemia: Secondary | ICD-10-CM | POA: Diagnosis not present

## 2016-11-02 DIAGNOSIS — I110 Hypertensive heart disease with heart failure: Secondary | ICD-10-CM | POA: Diagnosis present

## 2016-11-02 DIAGNOSIS — Z7901 Long term (current) use of anticoagulants: Secondary | ICD-10-CM | POA: Diagnosis not present

## 2016-11-02 DIAGNOSIS — Z86711 Personal history of pulmonary embolism: Secondary | ICD-10-CM

## 2016-11-02 DIAGNOSIS — I5030 Unspecified diastolic (congestive) heart failure: Secondary | ICD-10-CM | POA: Diagnosis not present

## 2016-11-02 DIAGNOSIS — N179 Acute kidney failure, unspecified: Secondary | ICD-10-CM | POA: Diagnosis present

## 2016-11-02 DIAGNOSIS — R0603 Acute respiratory distress: Secondary | ICD-10-CM

## 2016-11-02 DIAGNOSIS — D62 Acute posthemorrhagic anemia: Secondary | ICD-10-CM | POA: Diagnosis not present

## 2016-11-02 DIAGNOSIS — D638 Anemia in other chronic diseases classified elsewhere: Secondary | ICD-10-CM | POA: Diagnosis present

## 2016-11-02 DIAGNOSIS — J9801 Acute bronchospasm: Secondary | ICD-10-CM

## 2016-11-02 DIAGNOSIS — E785 Hyperlipidemia, unspecified: Secondary | ICD-10-CM | POA: Diagnosis present

## 2016-11-02 DIAGNOSIS — Z87891 Personal history of nicotine dependence: Secondary | ICD-10-CM

## 2016-11-02 DIAGNOSIS — I5032 Chronic diastolic (congestive) heart failure: Secondary | ICD-10-CM | POA: Diagnosis not present

## 2016-11-02 DIAGNOSIS — I10 Essential (primary) hypertension: Secondary | ICD-10-CM | POA: Diagnosis present

## 2016-11-02 DIAGNOSIS — N19 Unspecified kidney failure: Secondary | ICD-10-CM

## 2016-11-02 DIAGNOSIS — J9620 Acute and chronic respiratory failure, unspecified whether with hypoxia or hypercapnia: Secondary | ICD-10-CM | POA: Diagnosis not present

## 2016-11-02 DIAGNOSIS — Z79899 Other long term (current) drug therapy: Secondary | ICD-10-CM | POA: Diagnosis not present

## 2016-11-02 DIAGNOSIS — J101 Influenza due to other identified influenza virus with other respiratory manifestations: Secondary | ICD-10-CM | POA: Diagnosis not present

## 2016-11-02 DIAGNOSIS — R042 Hemoptysis: Secondary | ICD-10-CM

## 2016-11-02 DIAGNOSIS — J439 Emphysema, unspecified: Secondary | ICD-10-CM | POA: Diagnosis not present

## 2016-11-02 DIAGNOSIS — J9621 Acute and chronic respiratory failure with hypoxia: Secondary | ICD-10-CM | POA: Diagnosis present

## 2016-11-02 DIAGNOSIS — G9340 Encephalopathy, unspecified: Secondary | ICD-10-CM | POA: Diagnosis not present

## 2016-11-02 DIAGNOSIS — Z4659 Encounter for fitting and adjustment of other gastrointestinal appliance and device: Secondary | ICD-10-CM

## 2016-11-02 DIAGNOSIS — I2609 Other pulmonary embolism with acute cor pulmonale: Secondary | ICD-10-CM | POA: Diagnosis not present

## 2016-11-02 DIAGNOSIS — I714 Abdominal aortic aneurysm, without rupture, unspecified: Secondary | ICD-10-CM | POA: Diagnosis present

## 2016-11-02 DIAGNOSIS — J449 Chronic obstructive pulmonary disease, unspecified: Secondary | ICD-10-CM

## 2016-11-02 DIAGNOSIS — R0600 Dyspnea, unspecified: Secondary | ICD-10-CM

## 2016-11-02 DIAGNOSIS — Z7982 Long term (current) use of aspirin: Secondary | ICD-10-CM

## 2016-11-02 DIAGNOSIS — I739 Peripheral vascular disease, unspecified: Secondary | ICD-10-CM | POA: Diagnosis present

## 2016-11-02 DIAGNOSIS — E46 Unspecified protein-calorie malnutrition: Secondary | ICD-10-CM | POA: Diagnosis not present

## 2016-11-02 DIAGNOSIS — R410 Disorientation, unspecified: Secondary | ICD-10-CM | POA: Diagnosis not present

## 2016-11-02 DIAGNOSIS — A419 Sepsis, unspecified organism: Principal | ICD-10-CM | POA: Diagnosis present

## 2016-11-02 DIAGNOSIS — J90 Pleural effusion, not elsewhere classified: Secondary | ICD-10-CM | POA: Diagnosis not present

## 2016-11-02 DIAGNOSIS — R710 Precipitous drop in hematocrit: Secondary | ICD-10-CM | POA: Diagnosis not present

## 2016-11-02 DIAGNOSIS — J9611 Chronic respiratory failure with hypoxia: Secondary | ICD-10-CM | POA: Diagnosis present

## 2016-11-02 DIAGNOSIS — R6520 Severe sepsis without septic shock: Secondary | ICD-10-CM | POA: Diagnosis not present

## 2016-11-02 DIAGNOSIS — R031 Nonspecific low blood-pressure reading: Secondary | ICD-10-CM | POA: Diagnosis not present

## 2016-11-02 DIAGNOSIS — R0602 Shortness of breath: Secondary | ICD-10-CM | POA: Diagnosis not present

## 2016-11-02 DIAGNOSIS — D649 Anemia, unspecified: Secondary | ICD-10-CM | POA: Diagnosis not present

## 2016-11-02 DIAGNOSIS — R05 Cough: Secondary | ICD-10-CM | POA: Diagnosis not present

## 2016-11-02 DIAGNOSIS — K219 Gastro-esophageal reflux disease without esophagitis: Secondary | ICD-10-CM | POA: Diagnosis present

## 2016-11-02 DIAGNOSIS — N170 Acute kidney failure with tubular necrosis: Secondary | ICD-10-CM | POA: Diagnosis present

## 2016-11-02 DIAGNOSIS — R509 Fever, unspecified: Secondary | ICD-10-CM | POA: Diagnosis not present

## 2016-11-02 DIAGNOSIS — I959 Hypotension, unspecified: Secondary | ICD-10-CM | POA: Diagnosis not present

## 2016-11-02 DIAGNOSIS — Z66 Do not resuscitate: Secondary | ICD-10-CM | POA: Diagnosis present

## 2016-11-02 DIAGNOSIS — F419 Anxiety disorder, unspecified: Secondary | ICD-10-CM | POA: Diagnosis present

## 2016-11-02 DIAGNOSIS — J80 Acute respiratory distress syndrome: Secondary | ICD-10-CM | POA: Diagnosis not present

## 2016-11-02 DIAGNOSIS — Z4682 Encounter for fitting and adjustment of non-vascular catheter: Secondary | ICD-10-CM | POA: Diagnosis not present

## 2016-11-02 LAB — BLOOD GAS, ARTERIAL
ALLENS TEST (PASS/FAIL): POSITIVE — AB
Acid-base deficit: 6.9 mmol/L — ABNORMAL HIGH (ref 0.0–2.0)
Bicarbonate: 19 mmol/L — ABNORMAL LOW (ref 20.0–28.0)
DRAWN BY: 422461
O2 Content: 8 L/min
O2 Saturation: 98.1 %
PH ART: 7.267 — AB (ref 7.350–7.450)
Patient temperature: 98.7
pCO2 arterial: 43.2 mmHg (ref 32.0–48.0)
pO2, Arterial: 118 mmHg — ABNORMAL HIGH (ref 83.0–108.0)

## 2016-11-02 LAB — URINALYSIS, ROUTINE W REFLEX MICROSCOPIC
BILIRUBIN URINE: NEGATIVE
Glucose, UA: NEGATIVE mg/dL
Ketones, ur: NEGATIVE mg/dL
LEUKOCYTES UA: NEGATIVE
Nitrite: NEGATIVE
PH: 5 (ref 5.0–8.0)
Protein, ur: 30 mg/dL — AB
SPECIFIC GRAVITY, URINE: 1.019 (ref 1.005–1.030)

## 2016-11-02 LAB — COMPREHENSIVE METABOLIC PANEL
ALT: 16 U/L (ref 14–54)
ANION GAP: 7 (ref 5–15)
AST: 34 U/L (ref 15–41)
Albumin: 2.3 g/dL — ABNORMAL LOW (ref 3.5–5.0)
Alkaline Phosphatase: 51 U/L (ref 38–126)
BILIRUBIN TOTAL: 0.7 mg/dL (ref 0.3–1.2)
BUN: 37 mg/dL — AB (ref 6–20)
CHLORIDE: 103 mmol/L (ref 101–111)
CO2: 21 mmol/L — ABNORMAL LOW (ref 22–32)
Calcium: 6.6 mg/dL — ABNORMAL LOW (ref 8.9–10.3)
Creatinine, Ser: 1.93 mg/dL — ABNORMAL HIGH (ref 0.44–1.00)
GFR, EST AFRICAN AMERICAN: 26 mL/min — AB (ref >60)
GFR, EST NON AFRICAN AMERICAN: 22 mL/min — AB (ref >60)
Glucose, Bld: 103 mg/dL — ABNORMAL HIGH (ref 65–99)
POTASSIUM: 3.1 mmol/L — AB (ref 3.5–5.1)
Sodium: 131 mmol/L — ABNORMAL LOW (ref 135–145)
TOTAL PROTEIN: 5.4 g/dL — AB (ref 6.5–8.1)

## 2016-11-02 LAB — CBC WITH DIFFERENTIAL/PLATELET
BASOS ABS: 0 10*3/uL (ref 0.0–0.1)
Basophils Relative: 0 %
Eosinophils Absolute: 0 10*3/uL (ref 0.0–0.7)
Eosinophils Relative: 0 %
HEMATOCRIT: 23.9 % — AB (ref 36.0–46.0)
Hemoglobin: 8 g/dL — ABNORMAL LOW (ref 12.0–15.0)
LYMPHS ABS: 0.6 10*3/uL — AB (ref 0.7–4.0)
Lymphocytes Relative: 4 %
MCH: 31 pg (ref 26.0–34.0)
MCHC: 33.5 g/dL (ref 30.0–36.0)
MCV: 92.6 fL (ref 78.0–100.0)
MONO ABS: 0.3 10*3/uL (ref 0.1–1.0)
MONOS PCT: 2 %
NEUTROS PCT: 94 %
Neutro Abs: 13.8 10*3/uL — ABNORMAL HIGH (ref 1.7–7.7)
PLATELETS: 235 10*3/uL (ref 150–400)
RBC: 2.58 MIL/uL — AB (ref 3.87–5.11)
RDW: 18.3 % — AB (ref 11.5–15.5)
WBC: 14.7 10*3/uL — AB (ref 4.0–10.5)

## 2016-11-02 LAB — INFLUENZA PANEL BY PCR (TYPE A & B)
Influenza A By PCR: POSITIVE — AB
Influenza B By PCR: NEGATIVE

## 2016-11-02 LAB — I-STAT CG4 LACTIC ACID, ED: LACTIC ACID, VENOUS: 1.52 mmol/L (ref 0.5–1.9)

## 2016-11-02 MED ORDER — SENNOSIDES-DOCUSATE SODIUM 8.6-50 MG PO TABS
2.0000 | ORAL_TABLET | Freq: Two times a day (BID) | ORAL | Status: DC
  Administered 2016-11-03 – 2016-11-07 (×4): 2 via ORAL
  Filled 2016-11-02 (×4): qty 2

## 2016-11-02 MED ORDER — SODIUM CHLORIDE 0.9 % IV BOLUS (SEPSIS)
1000.0000 mL | Freq: Once | INTRAVENOUS | Status: AC
  Administered 2016-11-02: 1000 mL via INTRAVENOUS

## 2016-11-02 MED ORDER — IPRATROPIUM-ALBUTEROL 0.5-2.5 (3) MG/3ML IN SOLN
3.0000 mL | Freq: Once | RESPIRATORY_TRACT | Status: AC
  Administered 2016-11-02: 3 mL via RESPIRATORY_TRACT
  Filled 2016-11-02: qty 3

## 2016-11-02 MED ORDER — VANCOMYCIN HCL IN DEXTROSE 1-5 GM/200ML-% IV SOLN
1000.0000 mg | Freq: Once | INTRAVENOUS | Status: AC
  Administered 2016-11-02: 1000 mg via INTRAVENOUS
  Filled 2016-11-02: qty 200

## 2016-11-02 MED ORDER — PIPERACILLIN-TAZOBACTAM 3.375 G IVPB 30 MIN
3.3750 g | Freq: Once | INTRAVENOUS | Status: AC
  Administered 2016-11-02: 3.375 g via INTRAVENOUS
  Filled 2016-11-02: qty 50

## 2016-11-02 MED ORDER — ONDANSETRON HCL 4 MG PO TABS: 4.0000 mg | ORAL_TABLET | Freq: Four times a day (QID) | ORAL | Status: DC | PRN

## 2016-11-02 MED ORDER — SODIUM CHLORIDE 0.9% FLUSH
3.0000 mL | Freq: Two times a day (BID) | INTRAVENOUS | Status: DC
  Administered 2016-11-03 – 2016-11-08 (×10): 3 mL via INTRAVENOUS

## 2016-11-02 MED ORDER — OSELTAMIVIR PHOSPHATE 30 MG PO CAPS
30.0000 mg | ORAL_CAPSULE | Freq: Every day | ORAL | Status: DC
  Administered 2016-11-03 – 2016-11-04 (×3): 30 mg via ORAL
  Filled 2016-11-02 (×4): qty 1

## 2016-11-02 MED ORDER — OXYCODONE HCL 5 MG PO TABS
5.0000 mg | ORAL_TABLET | Freq: Four times a day (QID) | ORAL | Status: DC | PRN
  Administered 2016-11-04: 5 mg via ORAL
  Filled 2016-11-02: qty 1

## 2016-11-02 MED ORDER — METHYLPREDNISOLONE SODIUM SUCC 125 MG IJ SOLR
60.0000 mg | Freq: Four times a day (QID) | INTRAMUSCULAR | Status: DC
  Administered 2016-11-03 – 2016-11-04 (×6): 60 mg via INTRAVENOUS
  Filled 2016-11-02 (×6): qty 2

## 2016-11-02 MED ORDER — ASPIRIN EC 81 MG PO TBEC
81.0000 mg | DELAYED_RELEASE_TABLET | Freq: Every day | ORAL | Status: DC
  Administered 2016-11-03 – 2016-11-04 (×2): 81 mg via ORAL
  Filled 2016-11-02 (×2): qty 1

## 2016-11-02 MED ORDER — PIPERACILLIN-TAZOBACTAM 3.375 G IVPB
3.3750 g | Freq: Three times a day (TID) | INTRAVENOUS | Status: DC
  Administered 2016-11-03 – 2016-11-04 (×3): 3.375 g via INTRAVENOUS
  Filled 2016-11-02 (×3): qty 50

## 2016-11-02 MED ORDER — ALBUTEROL (5 MG/ML) CONTINUOUS INHALATION SOLN
10.0000 mg/h | INHALATION_SOLUTION | Freq: Once | RESPIRATORY_TRACT | Status: AC
  Administered 2016-11-02: 10 mg/h via RESPIRATORY_TRACT
  Filled 2016-11-02: qty 20

## 2016-11-02 MED ORDER — METHYLPREDNISOLONE SODIUM SUCC 125 MG IJ SOLR
125.0000 mg | Freq: Once | INTRAMUSCULAR | Status: AC
  Administered 2016-11-02: 125 mg via INTRAVENOUS
  Filled 2016-11-02: qty 2

## 2016-11-02 MED ORDER — HEPARIN SODIUM (PORCINE) 5000 UNIT/ML IJ SOLN
5000.0000 [IU] | Freq: Three times a day (TID) | INTRAMUSCULAR | Status: DC
  Administered 2016-11-03 (×2): 5000 [IU] via SUBCUTANEOUS
  Filled 2016-11-02 (×2): qty 1

## 2016-11-02 MED ORDER — IPRATROPIUM-ALBUTEROL 0.5-2.5 (3) MG/3ML IN SOLN
3.0000 mL | RESPIRATORY_TRACT | Status: DC | PRN
  Administered 2016-11-07: 3 mL via RESPIRATORY_TRACT
  Filled 2016-11-02: qty 3

## 2016-11-02 MED ORDER — PRO-STAT SUGAR FREE PO LIQD
30.0000 mL | Freq: Two times a day (BID) | ORAL | Status: DC
  Administered 2016-11-03 – 2016-11-08 (×4): 30 mL via ORAL
  Filled 2016-11-02 (×5): qty 30

## 2016-11-02 MED ORDER — HYDROMORPHONE HCL 1 MG/ML IJ SOLN
0.5000 mg | INTRAMUSCULAR | Status: DC | PRN
  Administered 2016-11-05 – 2016-11-06 (×7): 0.5 mg via INTRAVENOUS
  Filled 2016-11-02 (×7): qty 0.5

## 2016-11-02 MED ORDER — SODIUM CHLORIDE 0.9 % IV BOLUS (SEPSIS)
250.0000 mL | Freq: Once | INTRAVENOUS | Status: AC
  Administered 2016-11-02: 250 mL via INTRAVENOUS

## 2016-11-02 MED ORDER — MENTHOL (TOPICAL ANALGESIC) 5 % EX PTCH: 1.0000 "application " | MEDICATED_PATCH | Freq: Every day | CUTANEOUS | Status: DC

## 2016-11-02 MED ORDER — MOMETASONE FURO-FORMOTEROL FUM 200-5 MCG/ACT IN AERO
2.0000 | INHALATION_SPRAY | Freq: Two times a day (BID) | RESPIRATORY_TRACT | Status: DC
  Administered 2016-11-03 – 2016-11-04 (×3): 2 via RESPIRATORY_TRACT
  Filled 2016-11-02: qty 8.8

## 2016-11-02 MED ORDER — IPRATROPIUM-ALBUTEROL 0.5-2.5 (3) MG/3ML IN SOLN
3.0000 mL | Freq: Three times a day (TID) | RESPIRATORY_TRACT | Status: DC
  Administered 2016-11-03 – 2016-11-04 (×6): 3 mL via RESPIRATORY_TRACT
  Filled 2016-11-02 (×6): qty 3

## 2016-11-02 MED ORDER — ATORVASTATIN CALCIUM 10 MG PO TABS
20.0000 mg | ORAL_TABLET | Freq: Every day | ORAL | Status: DC
  Administered 2016-11-03 – 2016-11-07 (×3): 20 mg via ORAL
  Filled 2016-11-02 (×3): qty 1
  Filled 2016-11-02: qty 2

## 2016-11-02 MED ORDER — PANTOPRAZOLE SODIUM 40 MG PO TBEC: 40.0000 mg | DELAYED_RELEASE_TABLET | Freq: Every day | ORAL | Status: DC

## 2016-11-02 MED ORDER — CILOSTAZOL 100 MG PO TABS
100.0000 mg | ORAL_TABLET | Freq: Two times a day (BID) | ORAL | Status: DC
  Administered 2016-11-03 – 2016-11-04 (×4): 100 mg via ORAL
  Filled 2016-11-02 (×5): qty 1

## 2016-11-02 MED ORDER — MAGNESIUM SULFATE 2 GM/50ML IV SOLN
2.0000 g | Freq: Once | INTRAVENOUS | Status: AC
  Administered 2016-11-03: 2 g via INTRAVENOUS
  Filled 2016-11-02: qty 50

## 2016-11-02 MED ORDER — ALPRAZOLAM 0.25 MG PO TABS
0.2500 mg | ORAL_TABLET | Freq: Three times a day (TID) | ORAL | Status: DC | PRN
  Administered 2016-11-03 – 2016-11-08 (×3): 0.25 mg via ORAL
  Filled 2016-11-02 (×4): qty 1

## 2016-11-02 MED ORDER — ONDANSETRON HCL 4 MG/2ML IJ SOLN: 4.0000 mg | Freq: Four times a day (QID) | INTRAMUSCULAR | Status: DC | PRN

## 2016-11-02 MED ORDER — POLYETHYLENE GLYCOL 3350 17 G PO PACK
17.0000 g | PACK | Freq: Two times a day (BID) | ORAL | Status: DC
  Administered 2016-11-03 – 2016-11-07 (×3): 17 g via ORAL
  Filled 2016-11-02 (×4): qty 1

## 2016-11-02 MED ORDER — VANCOMYCIN HCL IN DEXTROSE 1-5 GM/200ML-% IV SOLN: 1000.0000 mg | INTRAVENOUS | Status: DC

## 2016-11-02 MED ORDER — SODIUM CHLORIDE 0.9 % IV SOLN
30.0000 meq | Freq: Once | INTRAVENOUS | Status: AC
  Administered 2016-11-03: 30 meq via INTRAVENOUS
  Filled 2016-11-02: qty 15

## 2016-11-02 NOTE — H&P (Signed)
History and Physical    Carrie Pena XBJ:[id number] DOB: 1927/12/13 DOA: 11/02/2016  PCP: Thayer Headings, MD   Patient coming from: Nursing Home  Chief Complaint: Fever, respiratory distress, hypotension   HPI: Carrie Pena is a 83 y.o. female with medical history significant for COPD with chronic hypoxic respiratory failure, hypertension, peripheral arterial disease, and history of pulmonary embolism on Xarelto, now presenting from her nursing facility for evaluation of fevers, respiratory distress, and hypotension. Patient had reportedly been in her usual state of health at the nursing facility until she was noted to have increased work of breathing and cough 1-2 days ago. She continued her home inhalers and nebs, but her symptoms continued to worsen. Today she was noted to be febrile and hypotensive. She was also noted to have increased work of breathing while at rest. Patient denies any significant chest pain or palpitations and denies leg swelling or orthopnea. She denies abdominal pain, dysuria, flank pain, vomiting, or diarrhea. EMS was called out to the nursing home today due to her low blood pressure and respiratory distress. She is found to be hypotensive upon their arrival and was given 700 mL of normal saline en route. She was treated with acetaminophen at the nursing home for fever just prior to leaving for the hospital.   ED Course: Upon arrival to the ED, patient is found to be afebrile, saturating low 90s on 4 L/m supplemental oxygen, tachycardic in the 130s, and with blood pressure 100/40. EKG features sinus rhythm with nonspecific T-wave abnormality in the lateral leads and chest x-ray is notable for slight increase in interstitial opacities in the bilateral bases consistent with acute interstitial inflammation superimposed on chronic changes. Chemistry panels notable for a sodium 131, potassium 3.1, BUN 37, and serum creatinine of 1.93, up from an apparent baseline of 0.7. CBC  is notable for a leukocytosis to 14,700 and a stable normocytic anemia with hemoglobin of 8.0. Lactic acid is reassuring at 1.52. Blood cultures were obtained in the ED, patient was treated with 30 cc/kg normal saline bolus, and she was started on empiric vancomycin and Zosyn. She was also treated with duo nebs in the ED. Blood pressure remains soft and she remained tachycardic, and an additional liter NS was given. Flu PCR returned positive for influenza A. Tachycardia and blood pressure improved, but the patient remained mildly tachycardic and was soft BP. She continues to be in respiratory distress and will be admitted to the stepdown unit for ongoing evaluation and management of severe sepsis, suspected secondary to influenza A with possible secondary bacterial infection.  Review of Systems:  All other systems reviewed and apart from HPI, are negative.  Past Medical History:  Diagnosis Date  . Carpal tunnel syndrome   . COPD (chronic obstructive pulmonary disease) (HCC)   . GERD (gastroesophageal reflux disease)   . Hyperlipidemia   . Hypertension   . Osteoarthritis     Past Surgical History:  Procedure Laterality Date  . KNEE ARTHROSCOPY    . left carpal tunnel    . TONSILLECTOMY       reports that she quit smoking about 19 months ago. Her smoking use included Cigarettes. She smoked 0.50 packs per day. She has never used smokeless tobacco. She reports that she does not drink alcohol or use drugs.  Allergies  Allergen Reactions  . Tylenol [Acetaminophen] Other (See Comments)    Reaction:  Shaking     Family History  Problem Relation Age of Onset  . Cancer  Mother   . Cancer Father   . Hypertension Daughter   . Heart disease Daughter     before age 81     Prior to Admission medications   Medication Sig Start Date End Date Taking? Authorizing Provider  ALPRAZolam (XANAX) 0.25 MG tablet Take 0.25 mg by mouth every 8 (eight) hours as needed for anxiety.   Yes Historical  Provider, MD  aspirin EC 81 MG tablet Take 81 mg by mouth daily.   Yes Historical Provider, MD  atorvastatin (LIPITOR) 20 MG tablet Take 20 mg by mouth daily at 6 PM.   Yes Historical Provider, MD  budesonide-formoterol (SYMBICORT) 160-4.5 MCG/ACT inhaler Inhale 2 puffs into the lungs 2 (two) times daily.   Yes Historical Provider, MD  cilostazol (PLETAL) 100 MG tablet Take 100 mg by mouth 2 (two) times daily.    Yes Historical Provider, MD  ipratropium-albuterol (DUONEB) 0.5-2.5 (3) MG/3ML SOLN Take 3 mLs by nebulization every 4 (four) hours as needed (for wheezing/shortness of breath).   Yes Historical Provider, MD  loperamide (IMODIUM A-D) 2 MG tablet Take 2 mg by mouth 4 (four) times daily as needed for diarrhea or loose stools.   Yes Historical Provider, MD  Menthol (ICY HOT) 5 % PTCH Apply 1 application topically daily. Pt applies to left upper arm every morning and removes at bedtime.   Yes Historical Provider, MD  omeprazole (PRILOSEC) 20 MG capsule Take 1 capsule (20 mg total) by mouth daily. 10/05/16  Yes Alison MurrayAlma M Devine, MD  oxycodone (OXY-IR) 5 MG capsule Take 5 mg by mouth every 6 (six) hours as needed for pain.   Yes Historical Provider, MD  polyethylene glycol (MIRALAX / GLYCOLAX) packet Take 17 g by mouth 2 (two) times daily.   Yes Historical Provider, MD  Protein (PROCEL) POWD Take 2 scoop by mouth 2 (two) times daily.   Yes Historical Provider, MD  senna-docusate (SENOKOT-S) 8.6-50 MG tablet Take 2 tablets by mouth 2 (two) times daily.   Yes Historical Provider, MD  alendronate (FOSAMAX) 70 MG tablet Take 70 mg by mouth every Thursday. Take with a full glass of water on an empty stomach.     Historical Provider, MD  OXYGEN Inhale 2 L into the lungs daily as needed (for low O2 sats).    Historical Provider, MD  Rivaroxaban (XARELTO) 15 MG TABS tablet Take 1 tablet (15 mg total) by mouth 2 (two) times daily with a meal. 10/05/16 10/26/16  Alison MurrayAlma M Devine, MD    Physical Exam: Vitals:    11/02/16 1756 11/02/16 1959 11/02/16 2048 11/02/16 2117  BP: (!) 102/42  123/55   Pulse: (!) 139  102   Resp: 23  23   Temp: 98.7 F (37.1 C)     TempSrc: Oral     SpO2: 93%  97% 92%  Weight:  66.2 kg (146 lb)        Constitutional: Respiratory distress with tachypnea, increased WOB, pallor. No diaphoresis. Mentating well. Eyes: PERTLA, lids and conjunctivae normal ENMT: Mucous membranes are dry. Posterior pharynx clear of any exudate or lesions.   Neck: normal, supple, no masses, no thyromegaly Respiratory: Accessory muscle recruitment. Crackles and rhonchi at both bases and mid-lung zones.   Cardiovascular: Rate ~110 and regular, no appreciable murmur. No significant JVD. Abdomen: No distension, no tenderness, no masses palpated. Bowel sounds normal.  Musculoskeletal: no clubbing / cyanosis. No joint deformity upper and lower extremities. Normal muscle tone.  Skin: no significant rashes, lesions,  ulcers. Warm, dry, well-perfused. Neurologic: CN 2-12 grossly intact. Sensation intact, DTR normal. Strength 5/5 in all 4 limbs.  Psychiatric: Normal judgment and insight. Alert and oriented x 3. Normal mood and affect.     Labs on Admission: I have personally reviewed following labs and imaging studies  CBC:  Recent Labs Lab 11/02/16 1817  WBC 14.7*  NEUTROABS 13.8*  HGB 8.0*  HCT 23.9*  MCV 92.6  PLT 235   Basic Metabolic Panel:  Recent Labs Lab 11/02/16 1817  NA 131*  K 3.1*  CL 103  CO2 21*  GLUCOSE 103*  BUN 37*  CREATININE 1.93*  CALCIUM 6.6*   GFR: Estimated Creatinine Clearance: 18.7 mL/min (by C-G formula based on SCr of 1.93 mg/dL (H)). Liver Function Tests:  Recent Labs Lab 11/02/16 1817  AST 34  ALT 16  ALKPHOS 51  BILITOT 0.7  PROT 5.4*  ALBUMIN 2.3*   No results for input(s): LIPASE, AMYLASE in the last 168 hours. No results for input(s): AMMONIA in the last 168 hours. Coagulation Profile: No results for input(s): INR, PROTIME in the  last 168 hours. Cardiac Enzymes: No results for input(s): CKTOTAL, CKMB, CKMBINDEX, TROPONINI in the last 168 hours. BNP (last 3 results) No results for input(s): PROBNP in the last 8760 hours. HbA1C: No results for input(s): HGBA1C in the last 72 hours. CBG: No results for input(s): GLUCAP in the last 168 hours. Lipid Profile: No results for input(s): CHOL, HDL, LDLCALC, TRIG, CHOLHDL, LDLDIRECT in the last 72 hours. Thyroid Function Tests: No results for input(s): TSH, T4TOTAL, FREET4, T3FREE, THYROIDAB in the last 72 hours. Anemia Panel: No results for input(s): VITAMINB12, FOLATE, FERRITIN, TIBC, IRON, RETICCTPCT in the last 72 hours. Urine analysis:    Component Value Date/Time   COLORURINE YELLOW 09/29/2016 1240   APPEARANCEUR HAZY (A) 09/29/2016 1240   LABSPEC 1.010 09/29/2016 1240   PHURINE 5.0 09/29/2016 1240   GLUCOSEU NEGATIVE 09/29/2016 1240   HGBUR SMALL (A) 09/29/2016 1240   BILIRUBINUR NEGATIVE 09/29/2016 1240   KETONESUR 5 (A) 09/29/2016 1240   PROTEINUR NEGATIVE 09/29/2016 1240   UROBILINOGEN 0.2 11/08/2009 1426   NITRITE NEGATIVE 09/29/2016 1240   LEUKOCYTESUR NEGATIVE 09/29/2016 1240   Sepsis Labs: @LABRCNTIP (procalcitonin:4,lacticidven:4) )No results found for this or any previous visit (from the past 240 hour(s)).   Radiological Exams on Admission: Dg Chest Portable 1 View  Result Date: 11/02/2016 CLINICAL DATA:  Shortness of breath and cough EXAM: PORTABLE CHEST 1 VIEW COMPARISON:  10/23/2016 FINDINGS: Mild diffuse interstitial opacities suggestive of chronic change. This is slightly increased at the bilateral lung bases and could reflect superimposed interstitial inflammation. Hazy atelectasis left base with possible tiny effusion. Stable cardiomediastinal silhouette with atherosclerosis. No pneumothorax. IMPRESSION: 1. Slight increased interstitial opacities within the bilateral lung bases could reflect acute interstitial inflammation on chronic change.  Mild left CP angle atelectasis with possible tiny effusion. 2. Atherosclerosis of the aorta Electronically Signed   By: Jasmine Pang M.D.   On: 11/02/2016 22:03    EKG: Independently reviewed. Sinus rhythm, non-specific T-wave abnormality lateral leads  Assessment/Plan  1. Sepsis, influenza A  - Pt meets sepsis criteria on admission, sxs primarily respiratory  - Blood and urine cultures obtained, sputum culture pending - She was treated with 30 cc/kg NS in ED  - Empiric vancomycin and Zosyn initiated in ED - PCR positive for influenza A and Tamiflu is started  - Given her critical illness and concern for possible bacterial co-infection, will continue empiric  abx for now    2. COPD with acute exacerbation, acute on chronic hypoxic respiratory failure  - Presents in respiratory distress with increased O2-requirement  - Sputum culture pending; empiric abx and Tamiflu as above for suspected infectious precipitant  - Started on systemic steroids with IV Solu-Medrol; continue nebs, supplemental O2    3. Acute kidney injury  - SCr is 1.93 on admission, up from apparent baseline of 0.8  - Likely a prerenal azotemia in setting of acute infection with hypotension; ATN possible in this setting  - She was given 3.25 liters of NS in ED and is continued on gentle IV hydration  - Avoid nephrotoxins, renally-dose medications, repeat chem panel in am   4. Hx of pulmonary embolism - No evidence for acute VTE  - Managed with Xarelto, held on admission given AKI with low GFR, resume as appropriate   5. Hypokalemia  - Serum potassium 3.1 on admission  - She was given 30 mEq IV potassium and 2 g IV magnesium  - Monitoring on telemetry, repeat chem panel in am   6. PAD  - Stable; good cap refill in toes  - Continue cilostazol   DVT prophylaxis: sq heparin  Code Status: DNR Family Communication: Discussed with patient Disposition Plan: Admit to stepdown unit Consults called: None Admission  status: Inpatient    Briscoe Deutscher, MD [facility] Pager [phone]  If 7PM-7AM, please contact night-coverage [email or web address] Password [facility]  11/02/2016, 10:51 PM

## 2016-11-02 NOTE — ED Triage Notes (Signed)
Per EMS,  [facility]  Pt is alert and oriented at baseline.  Facility called EMS because patient has been hypotensive and running a fever of 99.5.  Pt was given 650mg  of tylenol at facility.  Temp with EMS was 98.7.  Pt received 700mL of NS to.  Pt also presents with a non-productive cough.  Pt is a DNR and has paperwork at bedside.

## 2016-11-02 NOTE — ED Notes (Signed)
Unable to obtain urine from pt. 

## 2016-11-02 NOTE — ED Provider Notes (Signed)
[facility]-EMERGENCY DEPT Provider Note   CSN: [id number] Arrival date & time: 11/02/16  1740     History   Chief Complaint Chief Complaint  Patient presents with  . Hypotension    HPI AARICA WAX is a 83 y.o. female.  HPI Patient presents to the emergency room for evaluation of hypotension.  Patient was recently admitted to the hospital earlier this month.  Patient was in the hospital for acute on chronic respiratory failure with hypoxia. Patient was also diagnosed with associated with recent epistaxis. She has been on anticoagulation because of a pulmonary embolism diagnosis.  The patient resides at [facility]. EMS was called because she was hypotensive at the nursing facility. She also had a temperature of 99.5. Patient has had a persistent nonproductive cough. Patient denies any trouble with any chest pain. She does feel short of breath. She denies any vomiting or diarrhea  Pt does have paperwork documenting DNR status  Past Medical History:  Diagnosis Date  . Carpal tunnel syndrome   . COPD (chronic obstructive pulmonary disease) (HCC)   . GERD (gastroesophageal reflux disease)   . Hyperlipidemia   . Hypertension   . Osteoarthritis     Patient Active Problem List   Diagnosis Date Noted  . Chronic respiratory failure with hypoxia (HCC) 11/02/2016  . Sepsis (HCC) 11/02/2016  . Influenza A 11/02/2016  . Hypokalemia 11/02/2016  . Hypotension 11/02/2016  . Influenza 11/02/2016  . Severe sepsis (HCC) 11/02/2016  . Renal insufficiency 10/19/2016  . PAD (peripheral artery disease) (HCC) 10/19/2016  . Decreased hemoglobin 10/18/2016  . AAA (abdominal aortic aneurysm) without rupture (HCC) 10/03/2016  . Anxiety state 10/03/2016  . History of pulmonary embolism 10/03/2016  . Hyperglycemia 10/03/2016  . Anemia   . COPD (chronic obstructive pulmonary disease) (HCC) 09/29/2016  . Chronic diastolic CHF (congestive heart failure) (HCC) 09/06/2016  . AKI  (acute kidney injury) (HCC) 09/06/2016  . Iliopsoas muscle hematoma, left, initial encounter 09/01/2016  . Intractable pain 09/01/2016  . Macrocytic anemia 09/01/2016  . Essential hypertension, benign 08/22/2015  . COPD with acute exacerbation (HCC) 08/12/2015  . Acute on chronic respiratory failure with hypoxia (HCC) 08/12/2015  . CAP (community acquired pneumonia) 08/12/2015  . Pain of right lower extremity 05/06/2014  . Swelling of limb 05/06/2014  . Atherosclerosis of native arteries of the extremities with intermittent claudication 07/18/2011    Past Surgical History:  Procedure Laterality Date  . KNEE ARTHROSCOPY    . left carpal tunnel    . TONSILLECTOMY      OB History    No data available       Home Medications    Prior to Admission medications   Medication Sig Start Date End Date Taking? Authorizing Provider  ALPRAZolam (XANAX) 0.25 MG tablet Take 0.25 mg by mouth every 8 (eight) hours as needed for anxiety.   Yes Historical Provider, MD  aspirin EC 81 MG tablet Take 81 mg by mouth daily.   Yes Historical Provider, MD  atorvastatin (LIPITOR) 20 MG tablet Take 20 mg by mouth daily at 6 PM.   Yes Historical Provider, MD  budesonide-formoterol (SYMBICORT) 160-4.5 MCG/ACT inhaler Inhale 2 puffs into the lungs 2 (two) times daily.   Yes Historical Provider, MD  cilostazol (PLETAL) 100 MG tablet Take 100 mg by mouth 2 (two) times daily.    Yes Historical Provider, MD  ipratropium-albuterol (DUONEB) 0.5-2.5 (3) MG/3ML SOLN Take 3 mLs by nebulization every 4 (four) hours as needed (for  wheezing/shortness of breath).   Yes Historical Provider, MD  loperamide (IMODIUM A-D) 2 MG tablet Take 2 mg by mouth 4 (four) times daily as needed for diarrhea or loose stools.   Yes Historical Provider, MD  Menthol (ICY HOT) 5 % PTCH Apply 1 application topically daily. Pt applies to left upper arm every morning and removes at bedtime.   Yes Historical Provider, MD  omeprazole (PRILOSEC) 20  MG capsule Take 1 capsule (20 mg total) by mouth daily. 10/05/16  Yes Alison Murray, MD  oxycodone (OXY-IR) 5 MG capsule Take 5 mg by mouth every 6 (six) hours as needed for pain.   Yes Historical Provider, MD  polyethylene glycol (MIRALAX / GLYCOLAX) packet Take 17 g by mouth 2 (two) times daily.   Yes Historical Provider, MD  Protein (PROCEL) POWD Take 2 scoop by mouth 2 (two) times daily.   Yes Historical Provider, MD  senna-docusate (SENOKOT-S) 8.6-50 MG tablet Take 2 tablets by mouth 2 (two) times daily.   Yes Historical Provider, MD  alendronate (FOSAMAX) 70 MG tablet Take 70 mg by mouth every Thursday. Take with a full glass of water on an empty stomach.     Historical Provider, MD  OXYGEN Inhale 2 L into the lungs daily as needed (for low O2 sats).    Historical Provider, MD  Rivaroxaban (XARELTO) 15 MG TABS tablet Take 1 tablet (15 mg total) by mouth 2 (two) times daily with a meal. 10/05/16 10/26/16  Alison Murray, MD    Family History Family History  Problem Relation Age of Onset  . Cancer Mother   . Cancer Father   . Hypertension Daughter   . Heart disease Daughter     before age 22    Social History Social History  Substance Use Topics  . Smoking status: Former Smoker    Packs/day: 0.50    Types: Cigarettes    Quit date: 03/12/2015  . Smokeless tobacco: Never Used  . Alcohol use No     Allergies   Tylenol [acetaminophen]   Review of Systems Review of Systems  All other systems reviewed and are negative.    Physical Exam Updated Vital Signs BP 140/61 (BP Location: Right Arm)   Pulse (!) 150   Temp 99 F (37.2 C) (Oral)   Resp 26   Wt 66.2 kg   SpO2 97%   BMI 26.70 kg/m   Physical Exam  Constitutional: No distress.  Elderly, frail  HENT:  Head: Normocephalic and atraumatic.  Right Ear: External ear normal.  Left Ear: External ear normal.  Mouth/Throat: No oropharyngeal exudate (mucous membranes are dry).  Eyes: Conjunctivae are normal. Right eye  exhibits no discharge. Left eye exhibits no discharge. No scleral icterus.  Neck: Neck supple. No tracheal deviation present.  Cardiovascular: Normal rate, regular rhythm and intact distal pulses.   Pulmonary/Chest: No stridor. No respiratory distress. She has wheezes. She has no rales.  Frequent cough, rhonchorous breath sounds  Abdominal: Soft. Bowel sounds are normal. She exhibits no distension. There is no tenderness. There is no rebound and no guarding.  Musculoskeletal: She exhibits no edema or tenderness.  Neurological: She is alert. She has normal strength. No cranial nerve deficit (no facial droop, extraocular movements intact, no slurred speech) or sensory deficit. She exhibits normal muscle tone. She displays no seizure activity. Coordination normal.  Skin: Skin is warm and dry. No rash noted.  Psychiatric: She has a normal mood and affect.  Nursing note and  vitals reviewed.    ED Treatments / Results  Labs (all labs ordered are listed, but only abnormal results are displayed) Labs Reviewed  COMPREHENSIVE METABOLIC PANEL - Abnormal; Notable for the following:       Result Value   Sodium 131 (*)    Potassium 3.1 (*)    CO2 21 (*)    Glucose, Bld 103 (*)    BUN 37 (*)    Creatinine, Ser 1.93 (*)    Calcium 6.6 (*)    Total Protein 5.4 (*)    Albumin 2.3 (*)    GFR calc non Af Amer 22 (*)    GFR calc Af Amer 26 (*)    All other components within normal limits  CBC WITH DIFFERENTIAL/PLATELET - Abnormal; Notable for the following:    WBC 14.7 (*)    RBC 2.58 (*)    Hemoglobin 8.0 (*)    HCT 23.9 (*)    RDW 18.3 (*)    Neutro Abs 13.8 (*)    Lymphs Abs 0.6 (*)    All other components within normal limits  INFLUENZA PANEL BY PCR (TYPE A & B) - Abnormal; Notable for the following:    Influenza A By PCR POSITIVE (*)    All other components within normal limits  BLOOD GAS, ARTERIAL - Abnormal; Notable for the following:    pH, Arterial 7.267 (*)    pO2, Arterial 118 (*)     Bicarbonate 19.0 (*)    Acid-base deficit 6.9 (*)    Allens test (pass/fail) POSITIVE (*)    All other components within normal limits  CULTURE, BLOOD (ROUTINE X 2)  CULTURE, BLOOD (ROUTINE X 2)  URINE CULTURE  CULTURE, EXPECTORATED SPUTUM-ASSESSMENT  URINALYSIS, ROUTINE W REFLEX MICROSCOPIC  PROCALCITONIN  PROTIME-INR  APTT  COMPREHENSIVE METABOLIC PANEL  MAGNESIUM  CBC  LACTIC ACID, PLASMA  I-STAT CG4 LACTIC ACID, ED    EKG  EKG Interpretation  Date/Time:  Wednesday November 02 2016 18:21:03 EST Ventricular Rate:  94 PR Interval:    QRS Duration: 84 QT Interval:  394 QTC Calculation: 493 R Axis:   41 Text Interpretation:  Sinus rhythm Borderline T abnormalities, lateral leads Borderline prolonged QT interval No significant change since last tracing Confirmed by Alvilda Mckenna  MD-J, Juliane Guest ([id number]) on 11/02/2016 6:26:46 PM       Radiology Dg Chest Portable 1 View  Result Date: 11/02/2016 CLINICAL DATA:  Shortness of breath and cough EXAM: PORTABLE CHEST 1 VIEW COMPARISON:  10/23/2016 FINDINGS: Mild diffuse interstitial opacities suggestive of chronic change. This is slightly increased at the bilateral lung bases and could reflect superimposed interstitial inflammation. Hazy atelectasis left base with possible tiny effusion. Stable cardiomediastinal silhouette with atherosclerosis. No pneumothorax. IMPRESSION: 1. Slight increased interstitial opacities within the bilateral lung bases could reflect acute interstitial inflammation on chronic change. Mild left CP angle atelectasis with possible tiny effusion. 2. Atherosclerosis of the aorta Electronically Signed   By: Jasmine Pang M.D.   On: 11/02/2016 22:03    Procedures .Critical Care Performed by: Linwood Dibbles Authorized by: Linwood Dibbles   Critical care provider statement:    Critical care time (minutes):  45   Critical care was time spent personally by me on the following activities:  Discussions with consultants, evaluation of  patient's response to treatment, examination of patient, ordering and performing treatments and interventions, ordering and review of laboratory studies, ordering and review of radiographic studies, pulse oximetry, re-evaluation of patient's condition, obtaining history from patient  or surrogate and review of old charts    (including critical care time)  Medications Ordered in ED Medications  piperacillin-tazobactam (ZOSYN) IVPB 3.375 g (not administered)  vancomycin (VANCOCIN) IVPB 1000 mg/200 mL premix (not administered)  aspirin EC tablet 81 mg (not administered)  ALPRAZolam (XANAX) tablet 0.25 mg (not administered)  oxycodone (OXY-IR) immediate release capsule 5 mg (not administered)  atorvastatin (LIPITOR) tablet 20 mg (not administered)  pantoprazole (PROTONIX) EC tablet 40 mg (not administered)  cilostazol (PLETAL) tablet 100 mg (not administered)  ipratropium-albuterol (DUONEB) 0.5-2.5 (3) MG/3ML nebulizer solution 3 mL (not administered)  polyethylene glycol (MIRALAX / GLYCOLAX) packet 17 g (not administered)  senna-docusate (Senokot-S) tablet 2 tablet (not administered)  mometasone-formoterol (DULERA) 200-5 MCG/ACT inhaler 2 puff (not administered)  Menthol 5 % PTCH 1 application (not administered)  PROCEL POWD 2 scoop (not administered)  heparin injection 5,000 Units (not administered)  sodium chloride flush (NS) 0.9 % injection 3 mL (not administered)  ondansetron (ZOFRAN) tablet 4 mg (not administered)    Or  ondansetron (ZOFRAN) injection 4 mg (not administered)  oseltamivir (TAMIFLU) capsule 30 mg (not administered)  magnesium sulfate IVPB 2 g 50 mL (not administered)  potassium chloride 30 mEq in sodium chloride 0.9 % 265 mL (KCL MULTIRUN) IVPB (not administered)  methylPREDNISolone sodium succinate (SOLU-MEDROL) 125 mg/2 mL injection 60 mg (not administered)  HYDROmorphone (DILAUDID) injection 0.5 mg (not administered)  sodium chloride 0.9 % bolus 1,000 mL (1,000 mLs  Intravenous New Bag/Given 11/02/16 1833)    And  sodium chloride 0.9 % bolus 1,000 mL (0 mLs Intravenous Stopped 11/02/16 2148)    And  sodium chloride 0.9 % bolus 250 mL (0 mLs Intravenous Stopped 11/02/16 1840)  piperacillin-tazobactam (ZOSYN) IVPB 3.375 g (0 g Intravenous Stopped 11/02/16 1904)  vancomycin (VANCOCIN) IVPB 1000 mg/200 mL premix (0 mg Intravenous Stopped 11/02/16 2148)  ipratropium-albuterol (DUONEB) 0.5-2.5 (3) MG/3ML nebulizer solution 3 mL (3 mLs Nebulization Given 11/02/16 1904)  albuterol (PROVENTIL,VENTOLIN) solution continuous neb (10 mg/hr Nebulization Given 11/02/16 2117)  methylPREDNISolone sodium succinate (SOLU-MEDROL) 125 mg/2 mL injection 125 mg (125 mg Intravenous Given 11/02/16 2148)  sodium chloride 0.9 % bolus 1,000 mL (1,000 mLs Intravenous New Bag/Given 11/02/16 2244)     Initial Impression / Assessment and Plan / ED Course  I have reviewed the triage vital signs and the nursing notes.  Pertinent labs & imaging results that were available during my care of the patient were reviewed by me and considered in my medical decision making (see chart for details).  Clinical Course as of Nov 02 2256  Wed Nov 02, 2016  2048 Pt pulled off her o2 sat monitor.  She is still having labored breathing and wheezing.  Will order another neb to see if it offers her some relief.  Add on ABG.    [JK]  2226 Pt given fluid bolus.  Still not with much urine output.  Will give additional bolus  [JK]  2226 CXR without acute findings.   Acidosis noted on abg related to sepsis.  No hypercarbia.  [JK]    Clinical Course User Index [JK] Linwood Dibbles, MD   Patient's laboratory tests are notable for influenza.  She is dehydrated and has acute renal insufficiency.  Initially hypotensive but BP improved with fluids.  Pt has history of severe COPD with recurrent attacks.  Influenza is triggering recurrent bronchospasm today.  Some improvement with nebs and steroids although still remains labored.   O2 sat is holding in the mid  90s on supplemental o2.  Empiric abx started.  CXR and UA pending.  Will consult with medical service for admission to stepdown unit.  Final Clinical Impressions(s) / ED Diagnoses   Final diagnoses:  Influenza  Bronchospasm  Chronic obstructive pulmonary disease, unspecified COPD type [facility](HCC)    New Prescriptions New Prescriptions   No medications on file     Linwood DibblesJon Pascha Fogal, MD 11/02/16 2259

## 2016-11-02 NOTE — ED Notes (Signed)
Provider informed I & 0 was non productive.

## 2016-11-02 NOTE — Progress Notes (Signed)
Pharmacy Antibiotic Note  Carrie Pena is a 83 y.o. female recently [facility], [facility] with c/o fever and hypotension.  To start broad abx with vancomycin and zosyn for suspected sepsis.  - scr 1.93 (crcl~22), LA 1.52   Plan: - zosyn 3.375 gm IV x1 over 30 minutes, then 3.375 gm IV q8h (over 4 hours) - vancomycin 1000 mg IV q48h - monitor renal function __________________________     Temp (24hrs), Avg:98.7 F (37.1 C), Min:98.7 F (37.1 C), Max:98.7 F (37.1 C)  No results for input(s): WBC, CREATININE, LATICACIDVEN, VANCOTROUGH, VANCOPEAK, VANCORANDOM, GENTTROUGH, GENTPEAK, GENTRANDOM, TOBRATROUGH, TOBRAPEAK, TOBRARND, AMIKACINPEAK, AMIKACINTROU, AMIKACIN in the last 168 hours.  Estimated Creatinine Clearance: 28.8 mL/min (by C-G formula based on SCr of 1.25 mg/dL (H)).    Allergies  Allergen Reactions  . Tylenol [Acetaminophen] Other (See Comments)    Reaction:  Shaking      Thank you for allowing pharmacy to be a part of this patient's care.  Lucia Gaskinsham, Alleen Kehm P 11/02/2016 6:20 PM

## 2016-11-03 ENCOUNTER — Encounter (HOSPITAL_COMMUNITY): Payer: Medicare Other

## 2016-11-03 ENCOUNTER — Other Ambulatory Visit: Payer: Self-pay | Admitting: *Deleted

## 2016-11-03 ENCOUNTER — Ambulatory Visit: Payer: Medicare Other | Admitting: Vascular Surgery

## 2016-11-03 DIAGNOSIS — D649 Anemia, unspecified: Secondary | ICD-10-CM

## 2016-11-03 LAB — LACTIC ACID, PLASMA: Lactic Acid, Venous: 2.2 mmol/L (ref 0.5–1.9)

## 2016-11-03 LAB — COMPREHENSIVE METABOLIC PANEL
ALBUMIN: 2 g/dL — AB (ref 3.5–5.0)
ALT: 15 U/L (ref 14–54)
AST: 26 U/L (ref 15–41)
Alkaline Phosphatase: 46 U/L (ref 38–126)
Anion gap: 8 (ref 5–15)
BILIRUBIN TOTAL: 0.4 mg/dL (ref 0.3–1.2)
BUN: 33 mg/dL — AB (ref 6–20)
CO2: 17 mmol/L — ABNORMAL LOW (ref 22–32)
CREATININE: 1.82 mg/dL — AB (ref 0.44–1.00)
Calcium: 6.2 mg/dL — CL (ref 8.9–10.3)
Chloride: 110 mmol/L (ref 101–111)
GFR calc non Af Amer: 24 mL/min — ABNORMAL LOW (ref >60)
GFR, EST AFRICAN AMERICAN: 28 mL/min — AB (ref >60)
Glucose, Bld: 195 mg/dL — ABNORMAL HIGH (ref 65–99)
Potassium: 3.5 mmol/L (ref 3.5–5.1)
SODIUM: 135 mmol/L (ref 135–145)
TOTAL PROTEIN: 5 g/dL — AB (ref 6.5–8.1)

## 2016-11-03 LAB — CBC
HCT: 22.4 % — ABNORMAL LOW (ref 36.0–46.0)
Hemoglobin: 7.2 g/dL — ABNORMAL LOW (ref 12.0–15.0)
MCH: 30.5 pg (ref 26.0–34.0)
MCHC: 32.1 g/dL (ref 30.0–36.0)
MCV: 94.9 fL (ref 78.0–100.0)
PLATELETS: 191 10*3/uL (ref 150–400)
RBC: 2.36 MIL/uL — ABNORMAL LOW (ref 3.87–5.11)
RDW: 18.6 % — AB (ref 11.5–15.5)
WBC: 13.3 10*3/uL — ABNORMAL HIGH (ref 4.0–10.5)

## 2016-11-03 LAB — CBG MONITORING, ED: GLUCOSE-CAPILLARY: 156 mg/dL — AB (ref 65–99)

## 2016-11-03 LAB — MAGNESIUM: MAGNESIUM: 2.2 mg/dL (ref 1.7–2.4)

## 2016-11-03 LAB — HEPARIN LEVEL (UNFRACTIONATED): Heparin Unfractionated: 1.08 IU/mL — ABNORMAL HIGH (ref 0.30–0.70)

## 2016-11-03 LAB — PROTIME-INR
INR: 1.97
PROTHROMBIN TIME: 22.7 s — AB (ref 11.4–15.2)

## 2016-11-03 LAB — MRSA PCR SCREENING: MRSA by PCR: NEGATIVE

## 2016-11-03 LAB — PROCALCITONIN: PROCALCITONIN: 4.91 ng/mL

## 2016-11-03 LAB — APTT
APTT: 48 s — AB (ref 24–36)
aPTT: 161 seconds (ref 24–36)

## 2016-11-03 LAB — ABO/RH: ABO/RH(D): O NEG

## 2016-11-03 LAB — PREPARE RBC (CROSSMATCH)

## 2016-11-03 MED ORDER — SODIUM CHLORIDE 0.9 % IV SOLN
Freq: Once | INTRAVENOUS | Status: AC
  Administered 2016-11-03: 11:00:00 via INTRAVENOUS

## 2016-11-03 MED ORDER — PANTOPRAZOLE SODIUM 40 MG IV SOLR
40.0000 mg | Freq: Two times a day (BID) | INTRAVENOUS | Status: DC
  Administered 2016-11-03 (×2): 40 mg via INTRAVENOUS
  Filled 2016-11-03 (×2): qty 40

## 2016-11-03 MED ORDER — SODIUM CHLORIDE 0.9 % IV SOLN
1.0000 g | Freq: Once | INTRAVENOUS | Status: AC
  Administered 2016-11-03: 1 g via INTRAVENOUS
  Filled 2016-11-03: qty 10

## 2016-11-03 MED ORDER — HEPARIN (PORCINE) IN NACL 100-0.45 UNIT/ML-% IJ SOLN
700.0000 [IU]/h | INTRAMUSCULAR | Status: DC
  Administered 2016-11-03: 900 [IU]/h via INTRAVENOUS
  Administered 2016-11-04 – 2016-11-06 (×2): 700 [IU]/h via INTRAVENOUS
  Filled 2016-11-03 (×3): qty 250

## 2016-11-03 NOTE — ED Notes (Signed)
Staff noted that only the Pt's upper dentures were placed in denture cup at bedside.  This Clinical research associatewriter [facility] and [facility] staff confirmed that the Pt's bottom dentures were not brought in and remain in her room.

## 2016-11-03 NOTE — Progress Notes (Signed)
RT took pt off BIPAP and placed on 8 LPM HFNC sats 100%. No distress noted at this time.

## 2016-11-03 NOTE — ED Notes (Signed)
Dr Arlyss Queen. Smith notified of abnormal Calcium level phone order obtained and pharmacy called with order.

## 2016-11-03 NOTE — Progress Notes (Signed)
Pt placed on BiPAP for desaturations and increased WOB.

## 2016-11-03 NOTE — Progress Notes (Signed)
Patient stable on 5L nasal cannula at this time. BiPAP not indicated at this time. RT will continue to monitor.

## 2016-11-03 NOTE — Progress Notes (Signed)
CSW spoke to the Carrie Pena daughter [name] who stated that she and her daughter has power-of-attorney, but that the Carrie Pena does not have a legal guardian.  Carrie Pena daughter reports the Carrie Pena plan [facility].  CSW asked the Carrie Pena daughter if she had any further concerns and the Carrie Pena daughter replied in the negative and thanked the CSW.  Dorothe PeaJonathan F. RiffeyFrancesco Sor, LCSWA, LCAS Clinical Social Worker Ph: [phone]  11/03/2016

## 2016-11-03 NOTE — Progress Notes (Signed)
ANTICOAGULATION CONSULT NOTE - Initial Consult  Pharmacy Consult for Heparin Indication: Recent pulmonary embolus, now off Xarelto (AKI)  Allergies  Allergen Reactions  . Tylenol [Acetaminophen] Other (See Comments)    Reaction:  Shaking     Patient Measurements: Weight: 146 lb (66.2 kg)   Vital Signs: Temp: 97.6 F (36.4 C) (01/18 0653) Temp Source: Axillary (01/18 0653) BP: 127/93 (01/18 0800) Pulse Rate: 84 (01/18 0800)  Labs:  Recent Labs  11/02/16 1817 11/03/16 0036 11/03/16 0503  HGB 8.0*  --  7.2*  HCT 23.9*  --  22.4*  PLT 235  --  191  APTT  --  48*  --   LABPROT  --  22.7*  --   INR  --  1.97  --   CREATININE 1.93*  --  1.82*    Estimated Creatinine Clearance: 19.8 mL/min (by C-G formula based on SCr of 1.82 mg/dL (H)).   Medical History: Past Medical History:  Diagnosis Date  . Carpal tunnel syndrome   . COPD (chronic obstructive pulmonary disease) (HCC)   . GERD (gastroesophageal reflux disease)   . Hyperlipidemia   . Hypertension   . Osteoarthritis     Medications:  Scheduled:  . aspirin EC  81 mg Oral Daily  . atorvastatin  20 mg Oral q1800  . cilostazol  100 mg Oral BID  . feeding supplement (PRO-STAT SUGAR FREE 64)  30 mL Oral BID  . ipratropium-albuterol  3 mL Nebulization TID  . methylPREDNISolone (SOLU-MEDROL) injection  60 mg Intravenous Q6H  . mometasone-formoterol  2 puff Inhalation BID  . oseltamivir  30 mg Oral QHS  . pantoprazole (PROTONIX) IV  40 mg Intravenous Q12H  . polyethylene glycol  17 g Oral BID  . senna-docusate  2 tablet Oral BID  . sodium chloride flush  3 mL Intravenous Q12H   Infusions:  . sodium chloride    . piperacillin-tazobactam (ZOSYN)  IV 3.375 g (11/03/16 0242)  . [START ON 11/04/2016] vancomycin     PRN: ALPRAZolam, HYDROmorphone (DILAUDID) injection, ipratropium-albuterol, ondansetron **OR** ondansetron (ZOFRAN) IV, oxyCODONE  Assessment: 83 y/o F on Xarelto for pulmonary embolism diagnosed  December 2017, admitted the night of 11/02/16 with sepsis, influenza A, acute on chronic respiratory failure (COPD), and AKI.  Date / time of last Xarelto dose are not recorded on med history, but on admission elevation in INR and aPTT were noted, which is consistent with recent Xarelto use.  Because estimated CrCl is less than 30 mL/min, Xarelto was held on admission.  Orders subsequently received to begin heparin infusion with pharmacy dosing assistance.  Recent SQ heparin use noted (5000 units SQ q8h, LD 1/18 at 0612)  Goal of Therapy:  APTT 66-102 sec Heparin level 0.3-0.7 units/ml Monitor platelets by anticoagulation protocol: Yes   Plan:   Begin heparin infusion (without loading dose) at 900 units/hr per [place] nomogram.  Check heparin level and aPTT in 8 hours, then at least daily  When Xarelto effect fully dissipates allowing the heparin level and aPTT results to correlate, may discontinue aPTT monitoring and follow only heparin levels.  CBC daily.  Follow clinical course.  Elie Goodyandy Abbegale Stehle, PharmD, BCPS Pager: [phone] 11/03/2016  8:54 AM

## 2016-11-03 NOTE — Progress Notes (Signed)
CRITICAL VALUE ALERT  Critical value received:  APTT 161  Date of notification:  11/03/16  Time of notification:  1830   Critical value read back:Yes.    Nurse who received alert:  D.Alaiya Martindelcampo,RN  MD notified (1st page):  Pharmacy called  Time of first page:  1830  MD notified (2nd page):  Time of second page:  Responding MD:    Time MD responded:  1832-pharmacy

## 2016-11-03 NOTE — Progress Notes (Signed)
PROGRESS NOTE                                                                                                                                                                                                             Patient Demographics:    Carrie Pena, is a 83 y.o. female, DOB - 02-10-1928, FAO:[id number]  Admit date - 11/02/2016   Admitting Physician Briscoe Deutscher, MD  Outpatient Primary MD for the patient is Thayer Headings, MD  LOS - 1   Chief Complaint  Patient presents with  . Hypotension       Brief Narrative   83 y.o. female with medical history significant for COPD with chronic hypoxic respiratory failure, hypertension, peripheral arterial disease, and history of pulmonary embolism on Xarelto, now presenting from her nursing facility for evaluation of fevers, respiratory distress, and hypotension, Workup significant for influenza.   Subjective:    Jaretzi Droz today has, No headache, No chest pain, No abdominal pain - Reports generalized weakness and fatigue, shortness of breath and dry cough .   Assessment  & Plan :    Principal Problem:   Sepsis (HCC) Active Problems:   Essential hypertension, benign   Chronic diastolic CHF (congestive heart failure) (HCC)   AKI (acute kidney injury) (HCC)   COPD (chronic obstructive pulmonary disease) (HCC)   History of pulmonary embolism   Anemia   Decreased hemoglobin   PAD (peripheral artery disease) (HCC)   Chronic respiratory failure with hypoxia (HCC)   Influenza A   Hypokalemia   Hypotension   Influenza   Severe sepsis (HCC)   Sepsis, influenza A  - Pt meets sepsis criteria on admission, sxs primarily respiratory  - Blood and urine cultures  with no growth to date - Empiric vancomycin and Zosyn initiated in ED - PCR positive for influenza A and Tamiflu is started  - Given her critical illness and concern for possible bacterial co-infection, will continue empiric abx for  now    COPD with acute exacerbation, acute on chronic hypoxic respiratory failure  - Presents in respiratory distress with increased O2-requirement  - Sputum culture pending; empiric abx and Tamiflu as above for suspected infectious precipitant  - Started on systemic steroids with IV Solu-Medrol; continue nebs, supplemental O2    Acute kidney injury  - SCr is 1.93 on admission, up  from apparent baseline of 0.8  - Likely a prerenal azotemia in setting of acute infection with hypotension; ATN possible in this setting  - Continue with IV fluids - Avoid nephrotoxins, renally-dose medications, repeat chem panel in am   Hx of pulmonary embolism - No evidence for acute VTE  - Managed with Xarelto, is on hold given adrenal failure, started on heparin GTT  Hypokalemia  - Platelet, monitor closely  PAD  - Stable; good cap refill in toes  - Continue cilostazol  Anemia - Anemia of chronic disease , hemoglobin is 7.2 today, will transfuse 2 units PRBC  Code Status : DO NOT RESUSCITATE/DO NOT INTUBATE  Family Communication  : Discussed with daughter via phone  Disposition Plan  : Back to SNF once stable  Consults  : None  Procedures  : None, plan to  transfused 2 units PRBC today  DVT Prophylaxis  : Heparin gtt  Lab Results  Component Value Date   PLT 191 11/03/2016    Antibiotics  :    Anti-infectives    Start     Dose/Rate Route Frequency Ordered Stop   11/04/16 1800  vancomycin (VANCOCIN) IVPB 1000 mg/200 mL premix     1,000 mg 200 mL/hr over 60 Minutes Intravenous Every 48 hours 11/02/16 1907     11/03/16 0100  piperacillin-tazobactam (ZOSYN) IVPB 3.375 g     3.375 g 12.5 mL/hr over 240 Minutes Intravenous Every 8 hours 11/02/16 1907     11/02/16 2315  oseltamivir (TAMIFLU) capsule 30 mg     30 mg Oral Daily at bedtime 11/02/16 2251 11/07/16 2159   11/02/16 1830  piperacillin-tazobactam (ZOSYN) IVPB 3.375 g     3.375 g 100 mL/hr over 30 Minutes Intravenous  Once  11/02/16 1817 11/02/16 1904   11/02/16 1830  vancomycin (VANCOCIN) IVPB 1000 mg/200 mL premix     1,000 mg 200 mL/hr over 60 Minutes Intravenous  Once 11/02/16 1817 11/02/16 2148        Objective:   Vitals:   11/03/16 1000 11/03/16 1241 11/03/16 1300 11/03/16 1330  BP: (!) 101/49 (!) 94/49 (!) 102/51 (!) 101/47  Pulse: 84 81 79 78  Resp: 20 21 18 19   Temp:      TempSrc:      SpO2: 100% 99% 97% 98%  Weight:        Wt Readings from Last 3 Encounters:  11/02/16 66.2 kg (146 lb)  10/25/16 66.2 kg (146 lb)  10/24/16 66.2 kg (146 lb)     Intake/Output Summary (Last 24 hours) at 11/03/16 1524 Last data filed at 11/03/16 0010  Gross per 24 hour  Intake              303 ml  Output                0 ml  Net              303 ml     Physical Exam  Awake Alert, Oriented X 3 Supple Neck,No JVD Symmetrical Chest wall movement, Good air movement bilaterally, Mild at the Neck, no wheezing RRR,No Gallops,Rubs or new Murmurs, No Parasternal Heave +ve B.Sounds, Abd Soft, No tenderness,  No rebound - guarding or rigidity. No Cyanosis, Clubbing or edema, No new Rash or bruise      Data Review:    CBC  Recent Labs Lab 11/02/16 1817 11/03/16 0503  WBC 14.7* 13.3*  HGB 8.0* 7.2*  HCT 23.9* 22.4*  PLT 235  191  MCV 92.6 94.9  MCH 31.0 30.5  MCHC 33.5 32.1  RDW 18.3* 18.6*  LYMPHSABS 0.6*  --   MONOABS 0.3  --   EOSABS 0.0  --   BASOSABS 0.0  --     Chemistries   Recent Labs Lab 11/02/16 1817 11/03/16 0503  NA 131* 135  K 3.1* 3.5  CL 103 110  CO2 21* 17*  GLUCOSE 103* 195*  BUN 37* 33*  CREATININE 1.93* 1.82*  CALCIUM 6.6* 6.2*  MG  --  2.2  AST 34 26  ALT 16 15  ALKPHOS 51 46  BILITOT 0.7 0.4   ------------------------------------------------------------------------------------------------------------------ No results for input(s): CHOL, HDL, LDLCALC, TRIG, CHOLHDL, LDLDIRECT in the last 72 hours.  No results found for:  HGBA1C ------------------------------------------------------------------------------------------------------------------ No results for input(s): TSH, T4TOTAL, T3FREE, THYROIDAB in the last 72 hours.  Invalid input(s): FREET3 ------------------------------------------------------------------------------------------------------------------ No results for input(s): VITAMINB12, FOLATE, FERRITIN, TIBC, IRON, RETICCTPCT in the last 72 hours.  Coagulation profile  Recent Labs Lab 11/03/16 0036  INR 1.97    No results for input(s): DDIMER in the last 72 hours.  Cardiac Enzymes No results for input(s): CKMB, TROPONINI, MYOGLOBIN in the last 168 hours.  Invalid input(s): CK ------------------------------------------------------------------------------------------------------------------    Component Value Date/Time   BNP 54.1 10/21/2016 0553    Inpatient Medications  Scheduled Meds: . aspirin EC  81 mg Oral Daily  . atorvastatin  20 mg Oral q1800  . cilostazol  100 mg Oral BID  . feeding supplement (PRO-STAT SUGAR FREE 64)  30 mL Oral BID  . ipratropium-albuterol  3 mL Nebulization TID  . methylPREDNISolone (SOLU-MEDROL) injection  60 mg Intravenous Q6H  . mometasone-formoterol  2 puff Inhalation BID  . oseltamivir  30 mg Oral QHS  . pantoprazole (PROTONIX) IV  40 mg Intravenous Q12H  . polyethylene glycol  17 g Oral BID  . senna-docusate  2 tablet Oral BID  . sodium chloride flush  3 mL Intravenous Q12H   Continuous Infusions: . heparin 900 Units/hr (11/03/16 1141)  . piperacillin-tazobactam (ZOSYN)  IV Stopped (11/03/16 1422)  . [START ON 11/04/2016] vancomycin     PRN Meds:.ALPRAZolam, HYDROmorphone (DILAUDID) injection, ipratropium-albuterol, ondansetron **OR** ondansetron (ZOFRAN) IV, oxyCODONE  Micro Results Recent Results (from the past 240 hour(s))  Blood Culture (routine x 2)     Status: None (Preliminary result)   Collection Time: 11/02/16  6:41 PM  Result  Value Ref Range Status   Specimen Description BLOOD BLOOD RIGHT WRIST  Final   Special Requests BOTTLES DRAWN AEROBIC AND ANAEROBIC 10CC  Final   Culture   Final    NO GROWTH < 12 HOURS Performed at [facility], [address], [place], [place] [address]    Report Status PENDING  Incomplete  Blood Culture (routine x 2)     Status: None (Preliminary result)   Collection Time: 11/02/16  6:41 PM  Result Value Ref Range Status   Specimen Description BLOOD BLOOD RIGHT WRIST  Final   Special Requests BOTTLES DRAWN AEROBIC AND ANAEROBIC 10CC  Final   Culture   Final    NO GROWTH < 12 HOURS Performed at [facility], [address] [place], [place] [address]    Report Status PENDING  Incomplete    Radiology Reports Dg Chest 2 View  Result Date: 10/11/2016 CLINICAL DATA:  Cough and shortness of breath tonight. Recent diagnosis of pulmonary embolus. EXAM: CHEST  2 VIEW COMPARISON:  Radiograph 03/02/2016, CT 10/03/2016  FINDINGS: Lower lung volumes from prior exam. Mild emphysema. Tortuous thoracic aorta with atherosclerosis. The heart is normal in size. No pulmonary edema. Minimal left infrahilar atelectasis. No confluent airspace disease, pleural effusion or pneumothorax. Advanced degenerative change of the left shoulder. IMPRESSION: Left lung base atelectasis. Thoracic aortic atherosclerosis. Electronically Signed   By: Rubye OaksMelanie  Ehinger M.D.   On: 10/11/2016 00:08   Dg Chest Portable 1 View  Result Date: 11/02/2016 CLINICAL DATA:  Shortness of breath and cough EXAM: PORTABLE CHEST 1 VIEW COMPARISON:  10/23/2016 FINDINGS: Mild diffuse interstitial opacities suggestive of chronic change. This is slightly increased at the bilateral lung bases and could reflect superimposed interstitial inflammation. Hazy atelectasis left base with possible tiny effusion. Stable cardiomediastinal silhouette with atherosclerosis. No pneumothorax. IMPRESSION: 1. Slight increased interstitial opacities  within the bilateral lung bases could reflect acute interstitial inflammation on chronic change. Mild left CP angle atelectasis with possible tiny effusion. 2. Atherosclerosis of the aorta Electronically Signed   By: Jasmine PangKim  Fujinaga M.D.   On: 11/02/2016 22:03   Dg Chest Port 1 View  Result Date: 10/23/2016 CLINICAL DATA:  Shortness of Breath EXAM: PORTABLE CHEST 1 VIEW COMPARISON:  10/20/2016 FINDINGS: Cardiac shadow is stable. Aortic calcifications are again seen. Lungs are well aerated bilaterally. Interval clearing in the right base is noted. Some left basilar atelectasis is now seen however. Chronic degenerative change of the left shoulder joint is noted. No other focal abnormality is seen. IMPRESSION: Interval clearing of right basilar infiltrate. Left basilar atelectasis is now seen however. Electronically Signed   By: Alcide CleverMark  Lukens M.D.   On: 10/23/2016 08:18   Dg Chest Port 1 View  Result Date: 10/20/2016 CLINICAL DATA:  Shortness of breath. EXAM: PORTABLE CHEST 1 VIEW COMPARISON:  10/18/2016.  10/10/2016 . FINDINGS: Mediastinum hilar structures normal. Heart size normal. Low lung volumes with basilar atelectasis . Right base infiltrate suggesting pneumonia. No acute bony abnormality identified. IMPRESSION: Right base infiltrate noted consistent pneumonia. Low lung volumes with mild bibasilar atelectasis. Electronically Signed   By: Maisie Fushomas  Register   On: 10/20/2016 11:00   Dg Chest Port 1 View  Result Date: 10/18/2016 CLINICAL DATA:  Dyspnea EXAM: PORTABLE CHEST 1 VIEW COMPARISON:  10/10/2016 CXR FINDINGS: The heart size and mediastinal contours are within normal limits. Aortic atherosclerosis is noted. Patchy airspace disease at the right lung base suspicious for pneumonia. Subsegmental can platelike atelectasis at the left lung base. Osteoarthritis of the glenohumeral joints. IMPRESSION: Patchy new airspace opacity at the right lung base suspicious for pneumonia. Left lower lobe atelectasis.  Electronically Signed   By: Tollie Ethavid  Kwon M.D.   On: 10/18/2016 23:58    Fleetwood Pierron M.D on 11/03/2016 at 3:24 PM  Between 7am to 7pm - Pager - [phone]  After 7pm go to [email or web address] - password [email or web address]  [facility] -  Office  [phone]

## 2016-11-03 NOTE — Patient Outreach (Signed)
[facility] Care Management  11/03/2016  Carrie Pena [birth year] [id number]   CSW received an [employer] Message in [employer], indicating that patient has been [facility] where patient was residing to receive short-term rehabilitative services on Wednesday, January 17th.  Upon arrival by EMS (Emergency Medical Services) patient was in a Hypotensive State and running a fever of 99.5.  Patient was also found to have Sepsis with Influenza A.  CSW will continue to follow patient while hospitalized, as well as plan to meet with patient for the next routine visit at [facility] on Tuesday, January 30th. Carrie Pena, BSW, MSW, LCSW  Licensed Restaurant manager, fast foodClinical Social Worker  [facility] Care Management [facility]  [address] [place], [place], [place] [address] Physical Address-[address], [place], [place] [address] Toll Free Main # [phone] Fax # [phone] Cell # [phone]  Fax # [phone]  [email or web address]

## 2016-11-03 NOTE — Progress Notes (Signed)
ANTICOAGULATION CONSULT NOTE - Initial Consult  Pharmacy Consult for Heparin Indication: Recent pulmonary embolus, now off Xarelto (AKI)  Allergies  Allergen Reactions  . Tylenol [Acetaminophen] Other (See Comments)    Reaction:  Shaking     Patient Measurements: Height: 5\' 2"  (157.5 cm) Weight: 146 lb (66.2 kg) IBW/kg (Calculated) : 50.1   Vital Signs: Temp: 97.6 F (36.4 C) (01/18 1700) Temp Source: Oral (01/18 1700) BP: 95/52 (01/18 1700) Pulse Rate: 86 (01/18 1700)  Labs:  Recent Labs  11/02/16 1817 11/03/16 0036 11/03/16 0503 11/03/16 1725  HGB 8.0*  --  7.2*  --   HCT 23.9*  --  22.4*  --   PLT 235  --  191  --   APTT  --  48*  --  161*  LABPROT  --  22.7*  --   --   INR  --  1.97  --   --   CREATININE 1.93*  --  1.82*  --     Estimated Creatinine Clearance: 19.8 mL/min (by C-G formula based on SCr of 1.82 mg/dL (H)).   Medical History: Past Medical History:  Diagnosis Date  . Carpal tunnel syndrome   . COPD (chronic obstructive pulmonary disease) (HCC)   . GERD (gastroesophageal reflux disease)   . Hyperlipidemia   . Hypertension   . Osteoarthritis     Medications:  Scheduled:  . aspirin EC  81 mg Oral Daily  . atorvastatin  20 mg Oral q1800  . cilostazol  100 mg Oral BID  . feeding supplement (PRO-STAT SUGAR FREE 64)  30 mL Oral BID  . ipratropium-albuterol  3 mL Nebulization TID  . methylPREDNISolone (SOLU-MEDROL) injection  60 mg Intravenous Q6H  . mometasone-formoterol  2 puff Inhalation BID  . oseltamivir  30 mg Oral QHS  . pantoprazole (PROTONIX) IV  40 mg Intravenous Q12H  . piperacillin-tazobactam (ZOSYN)  IV  3.375 g Intravenous Q8H  . polyethylene glycol  17 g Oral BID  . senna-docusate  2 tablet Oral BID  . sodium chloride flush  3 mL Intravenous Q12H  . [START ON 11/04/2016] vancomycin  1,000 mg Intravenous Q48H   Infusions:  . heparin 900 Units/hr (11/03/16 1141)   PRN: ALPRAZolam, HYDROmorphone (DILAUDID) injection,  ipratropium-albuterol, ondansetron **OR** ondansetron (ZOFRAN) IV, oxyCODONE  Assessment: 83 y/o F on Xarelto for pulmonary embolism diagnosed December 2017, admitted the night of 11/02/16 with sepsis, influenza A, acute on chronic respiratory failure (COPD), and AKI.  Date / time of last Xarelto dose are not recorded on med history, but on admission elevation in INR and aPTT were noted, which is consistent with recent Xarelto use.  Because estimated CrCl is less than 30 mL/min, Xarelto was held on admission.  Orders subsequently received to begin heparin infusion with pharmacy dosing assistance.  Recent SQ heparin use noted (5000 units SQ q8h, LD 1/18 at 0612)  1st aPTT = 161 supratherapeutic No bleeding or other issues per RN  Goal of Therapy:  APTT 66-102 sec Heparin level 0.3-0.7 units/ml Monitor platelets by anticoagulation protocol: Yes   Plan:   decrease heparin infusion to 750 units/hr   Check heparin level and aPTT in 8 hours, then at least daily  When Xarelto effect fully dissipates allowing the heparin level and aPTT results to correlate, may discontinue aPTT monitoring and follow only heparin levels.  CBC daily.  Follow clinical course.  Arley Phenixllen Kofi Murrell RPh 11/03/2016, 6:34 PM Pager [phone]

## 2016-11-04 LAB — CBC
HEMATOCRIT: 27.1 % — AB (ref 36.0–46.0)
Hemoglobin: 9 g/dL — ABNORMAL LOW (ref 12.0–15.0)
MCH: 30 pg (ref 26.0–34.0)
MCHC: 33.2 g/dL (ref 30.0–36.0)
MCV: 90.3 fL (ref 78.0–100.0)
PLATELETS: 186 10*3/uL (ref 150–400)
RBC: 3 MIL/uL — ABNORMAL LOW (ref 3.87–5.11)
RDW: 17.1 % — AB (ref 11.5–15.5)
WBC: 11.5 10*3/uL — ABNORMAL HIGH (ref 4.0–10.5)

## 2016-11-04 LAB — APTT: aPTT: 176 seconds (ref 24–36)

## 2016-11-04 LAB — BASIC METABOLIC PANEL
Anion gap: 7 (ref 5–15)
BUN: 48 mg/dL — AB (ref 6–20)
CALCIUM: 7.1 mg/dL — AB (ref 8.9–10.3)
CO2: 18 mmol/L — ABNORMAL LOW (ref 22–32)
CREATININE: 2.27 mg/dL — AB (ref 0.44–1.00)
Chloride: 111 mmol/L (ref 101–111)
GFR calc Af Amer: 21 mL/min — ABNORMAL LOW (ref >60)
GFR, EST NON AFRICAN AMERICAN: 18 mL/min — AB (ref >60)
GLUCOSE: 144 mg/dL — AB (ref 65–99)
Potassium: 3.2 mmol/L — ABNORMAL LOW (ref 3.5–5.1)
Sodium: 136 mmol/L (ref 135–145)

## 2016-11-04 LAB — HEPARIN LEVEL (UNFRACTIONATED)
Heparin Unfractionated: 0.71 IU/mL — ABNORMAL HIGH (ref 0.30–0.70)
Heparin Unfractionated: 0.55 IU/mL (ref 0.30–0.70)

## 2016-11-04 LAB — URINE CULTURE: Culture: NO GROWTH

## 2016-11-04 LAB — GLUCOSE, CAPILLARY: Glucose-Capillary: 118 mg/dL — ABNORMAL HIGH (ref 65–99)

## 2016-11-04 MED ORDER — MENTHOL 3 MG MT LOZG
1.0000 | LOZENGE | OROMUCOSAL | Status: DC | PRN
  Filled 2016-11-04: qty 9

## 2016-11-04 MED ORDER — PIPERACILLIN-TAZOBACTAM IN DEX 2-0.25 GM/50ML IV SOLN
2.2500 g | Freq: Four times a day (QID) | INTRAVENOUS | Status: DC
  Administered 2016-11-04 – 2016-11-08 (×16): 2.25 g via INTRAVENOUS
  Filled 2016-11-04 (×20): qty 50

## 2016-11-04 MED ORDER — METHYLPREDNISOLONE SODIUM SUCC 125 MG IJ SOLR
60.0000 mg | Freq: Two times a day (BID) | INTRAMUSCULAR | Status: DC
  Administered 2016-11-04 – 2016-11-05 (×2): 60 mg via INTRAVENOUS
  Filled 2016-11-04 (×2): qty 2

## 2016-11-04 MED ORDER — IPRATROPIUM-ALBUTEROL 0.5-2.5 (3) MG/3ML IN SOLN
3.0000 mL | Freq: Four times a day (QID) | RESPIRATORY_TRACT | Status: DC
  Administered 2016-11-05 – 2016-11-08 (×13): 3 mL via RESPIRATORY_TRACT
  Filled 2016-11-04 (×12): qty 3

## 2016-11-04 MED ORDER — PANTOPRAZOLE SODIUM 40 MG PO TBEC
40.0000 mg | DELAYED_RELEASE_TABLET | Freq: Two times a day (BID) | ORAL | Status: DC
  Administered 2016-11-04 (×2): 40 mg via ORAL
  Filled 2016-11-04 (×2): qty 1

## 2016-11-04 MED ORDER — POTASSIUM CHLORIDE CRYS ER 20 MEQ PO TBCR
30.0000 meq | EXTENDED_RELEASE_TABLET | Freq: Two times a day (BID) | ORAL | Status: AC
  Administered 2016-11-04 (×2): 30 meq via ORAL
  Filled 2016-11-04 (×2): qty 1

## 2016-11-04 MED ORDER — POTASSIUM CHLORIDE IN NACL 20-0.9 MEQ/L-% IV SOLN
INTRAVENOUS | Status: DC
  Administered 2016-11-04 – 2016-11-05 (×2): via INTRAVENOUS
  Filled 2016-11-04 (×2): qty 1000

## 2016-11-04 NOTE — Progress Notes (Signed)
PROGRESS NOTE                                                                                                                                                                                                             Patient Demographics:    Carrie Pena, is a 83 y.o. female, DOB - [birth year], ZOX:[id number]  Admit date - 11/02/2016   Admitting Physician Briscoe Deutscher, MD  Outpatient Primary MD for the patient is Thayer Headings, MD  LOS - 2   Chief Complaint  Patient presents with  . Hypotension       Brief Narrative   83 y.o. female with medical history significant for COPD with chronic hypoxic respiratory failure, hypertension, peripheral arterial disease, and history of pulmonary embolism on Xarelto, now presenting from her nursing facility for evaluation of fevers, respiratory distress, and hypotension, Workup significant for influenza.   Subjective:    Carrie Pena today has, No headache, No chest pain, No abdominal pain - Reports generalized weakness and fatigue, shortness of breath and dry cough , Reports she is feeling better today.   Assessment  & Plan :    Principal Problem:   Sepsis (HCC) Active Problems:   Essential hypertension, benign   Chronic diastolic CHF (congestive heart failure) (HCC)   AKI (acute kidney injury) (HCC)   COPD (chronic obstructive pulmonary disease) (HCC)   History of pulmonary embolism   Anemia   Decreased hemoglobin   PAD (peripheral artery disease) (HCC)   Chronic respiratory failure with hypoxia (HCC)   Influenza A   Hypokalemia   Hypotension   Influenza   Severe sepsis (HCC)   Sepsis, influenza A  - Pt meets sepsis criteria on admission, sxs primarily respiratory  - Blood and urine cultures  with no growth to date - Empiric vancomycin and Zosyn initiated in ED, will discontinue vancomycin, continue Zosyn, will stop in the next 24 hours - PCR positive for influenza A and Tamiflu  is started   COPD with acute exacerbation, acute on chronic hypoxic respiratory failure  - Presents in respiratory distress with increased O2-requirement  - Started on systemic steroids with IV Solu-Medrol; continue nebs, supplemental O2  , wheezing improved today, so we'll taper steroids  Acute kidney injury  - SCr is 1.93 on admission, up from apparent baseline of 0.8 , and to need  to increase, today is 2.2 - Likely a prerenal azotemia in setting of acute infection with hypotension; ATN possible in this setting  - Continue with IV fluids - Avoid nephrotoxins, renally-dose medications, repeat chem panel in am   Hx of pulmonary embolism - No evidence for acute VTE  - Managed with Xarelto, is on hold given adrenal failure, started on heparin GTT  Hypokalemia  - Repleted, monitor closely  PAD  - Stable; good cap refill in toes  - Continue cilostazol  Anemia - Anemia of chronic disease , has been Hemoccult positive repeat his admission, but was unstable for EGD , she was transfused 2 units PRBC event, hemoglobin has been stable since , hemoglobin 7.2, transfused 2 units PRBC, with good response, today is 9 , giving her frail respiratory status, I don't think she will be able to have any GI workup yet, for now we'll continue to monitor and transfuse as needed.   Code Status : DO NOT RESUSCITATE/DO NOT INTUBATE  Family Communication  : Discussed with granddaughter daughter via phone 1/19  Disposition Plan  : Back to SNF once stable  Consults  : None  Procedures  : None, plan to  transfused 2 units PRBC1/18  DVT Prophylaxis  : Heparin gtt  Lab Results  Component Value Date   PLT 186 11/04/2016    Antibiotics  :    Anti-infectives    Start     Dose/Rate Route Frequency Ordered Stop   11/04/16 1800  vancomycin (VANCOCIN) IVPB 1000 mg/200 mL premix  Status:  Discontinued     1,000 mg 200 mL/hr over 60 Minutes Intravenous Every 48 hours 11/02/16 1907 11/04/16 0900    11/04/16 0900  piperacillin-tazobactam (ZOSYN) IVPB 2.25 g     2.25 g 100 mL/hr over 30 Minutes Intravenous Every 6 hours 11/04/16 0714     11/03/16 0100  piperacillin-tazobactam (ZOSYN) IVPB 3.375 g  Status:  Discontinued     3.375 g 12.5 mL/hr over 240 Minutes Intravenous Every 8 hours 11/02/16 1907 11/04/16 0714   11/02/16 2315  oseltamivir (TAMIFLU) capsule 30 mg     30 mg Oral Daily at bedtime 11/02/16 2251 11/07/16 2159   11/02/16 1830  piperacillin-tazobactam (ZOSYN) IVPB 3.375 g     3.375 g 100 mL/hr over 30 Minutes Intravenous  Once 11/02/16 1817 11/02/16 1904   11/02/16 1830  vancomycin (VANCOCIN) IVPB 1000 mg/200 mL premix     1,000 mg 200 mL/hr over 60 Minutes Intravenous  Once 11/02/16 1817 11/02/16 2148        Objective:   Vitals:   11/04/16 0051 11/04/16 0106 11/04/16 0400 11/04/16 0755  BP: (!) 125/56 121/61 (!) 115/57   Pulse: 78 81 76   Resp: 20 18 18    Temp: 97.8 F (36.6 C) 97.8 F (36.6 C) 97.8 F (36.6 C)   TempSrc: Oral Oral Oral   SpO2: 99% 96% 96% 96%  Weight:      Height:        Wt Readings from Last 3 Encounters:  11/02/16 66.2 kg (146 lb)  10/25/16 66.2 kg (146 lb)  10/24/16 66.2 kg (146 lb)     Intake/Output Summary (Last 24 hours) at 11/04/16 1301 Last data filed at 11/04/16 1130  Gross per 24 hour  Intake          1198.28 ml  Output              100 ml  Net  1098.28 ml     Physical Exam  Awake Alert, Oriented X 3 Supple Neck,No JVD Symmetrical Chest wall movement, Good air movement bilaterally,No respiratory distress, no wheezing RRR,No Gallops,Rubs or new Murmurs, No Parasternal Heave +ve B.Sounds, Abd Soft, No tenderness,  No rebound - guarding or rigidity. No Cyanosis, Clubbing or edema, No new Rash or bruise      Data Review:    CBC  Recent Labs Lab 11/02/16 1817 11/03/16 0503 11/04/16 0521  WBC 14.7* 13.3* 11.5*  HGB 8.0* 7.2* 9.0*  HCT 23.9* 22.4* 27.1*  PLT 235 191 186  MCV 92.6 94.9 90.3  MCH  31.0 30.5 30.0  MCHC 33.5 32.1 33.2  RDW 18.3* 18.6* 17.1*  LYMPHSABS 0.6*  --   --   MONOABS 0.3  --   --   EOSABS 0.0  --   --   BASOSABS 0.0  --   --     Chemistries   Recent Labs Lab 11/02/16 1817 11/03/16 0503 11/04/16 0521  NA 131* 135 136  K 3.1* 3.5 3.2*  CL 103 110 111  CO2 21* 17* 18*  GLUCOSE 103* 195* 144*  BUN 37* 33* 48*  CREATININE 1.93* 1.82* 2.27*  CALCIUM 6.6* 6.2* 7.1*  MG  --  2.2  --   AST 34 26  --   ALT 16 15  --   ALKPHOS 51 46  --   BILITOT 0.7 0.4  --    ------------------------------------------------------------------------------------------------------------------ No results for input(s): CHOL, HDL, LDLCALC, TRIG, CHOLHDL, LDLDIRECT in the last 72 hours.  No results found for: HGBA1C ------------------------------------------------------------------------------------------------------------------ No results for input(s): TSH, T4TOTAL, T3FREE, THYROIDAB in the last 72 hours.  Invalid input(s): FREET3 ------------------------------------------------------------------------------------------------------------------ No results for input(s): VITAMINB12, FOLATE, FERRITIN, TIBC, IRON, RETICCTPCT in the last 72 hours.  Coagulation profile  Recent Labs Lab 11/03/16 0036  INR 1.97    No results for input(s): DDIMER in the last 72 hours.  Cardiac Enzymes No results for input(s): CKMB, TROPONINI, MYOGLOBIN in the last 168 hours.  Invalid input(s): CK ------------------------------------------------------------------------------------------------------------------    Component Value Date/Time   BNP 54.1 10/21/2016 0553    Inpatient Medications  Scheduled Meds: . aspirin EC  81 mg Oral Daily  . atorvastatin  20 mg Oral q1800  . cilostazol  100 mg Oral BID  . feeding supplement (PRO-STAT SUGAR FREE 64)  30 mL Oral BID  . ipratropium-albuterol  3 mL Nebulization TID  . methylPREDNISolone (SOLU-MEDROL) injection  60 mg Intravenous Q6H    . mometasone-formoterol  2 puff Inhalation BID  . oseltamivir  30 mg Oral QHS  . pantoprazole  40 mg Oral BID AC  . piperacillin-tazobactam (ZOSYN)  IV  2.25 g Intravenous Q6H  . polyethylene glycol  17 g Oral BID  . potassium chloride  30 mEq Oral BID  . senna-docusate  2 tablet Oral BID  . sodium chloride flush  3 mL Intravenous Q12H   Continuous Infusions: . 0.9 % NaCl with KCl 20 mEq / L 75 mL/hr at 11/04/16 1104  . heparin 700 Units/hr (11/04/16 0739)   PRN Meds:.ALPRAZolam, HYDROmorphone (DILAUDID) injection, ipratropium-albuterol, menthol-cetylpyridinium, ondansetron **OR** ondansetron (ZOFRAN) IV, oxyCODONE  Micro Results Recent Results (from the past 240 hour(s))  Blood Culture (routine x 2)     Status: None (Preliminary result)   Collection Time: 11/02/16  6:41 PM  Result Value Ref Range Status   Specimen Description BLOOD BLOOD RIGHT WRIST  Final   Special Requests BOTTLES DRAWN AEROBIC AND ANAEROBIC  10CC  Final   Culture   Final    NO GROWTH < 12 HOURS Performed at [facility], [address], [place], [place] [address]    Report Status PENDING  Incomplete  Blood Culture (routine x 2)     Status: None (Preliminary result)   Collection Time: 11/02/16  6:41 PM  Result Value Ref Range Status   Specimen Description BLOOD BLOOD RIGHT WRIST  Final   Special Requests BOTTLES DRAWN AEROBIC AND ANAEROBIC 10CC  Final   Culture   Final    NO GROWTH < 12 HOURS Performed at [facility], [address], [place], [place] [address]    Report Status PENDING  Incomplete  Urine culture     Status: None   Collection Time: 11/02/16 11:16 PM  Result Value Ref Range Status   Specimen Description URINE, CATHETERIZED  Final   Special Requests NONE  Final   Culture   Final    NO GROWTH Performed at [facility], [address] [place], [place] [address]    Report Status 11/04/2016 FINAL  Final  MRSA PCR Screening     Status: None   Collection Time: 11/03/16   7:17 PM  Result Value Ref Range Status   MRSA by PCR NEGATIVE NEGATIVE Final    Comment:        The GeneXpert MRSA Assay (FDA approved for NASAL specimens only), is one component of a comprehensive MRSA colonization surveillance program. It is not intended to diagnose MRSA infection nor to guide or monitor treatment for MRSA infections.     Radiology Reports Dg Chest 2 View  Result Date: 10/11/2016 CLINICAL DATA:  Cough and shortness of breath tonight. Recent diagnosis of pulmonary embolus. EXAM: CHEST  2 VIEW COMPARISON:  Radiograph 03/02/2016, CT 10/03/2016 FINDINGS: Lower lung volumes from prior exam. Mild emphysema. Tortuous thoracic aorta with atherosclerosis. The heart is normal in size. No pulmonary edema. Minimal left infrahilar atelectasis. No confluent airspace disease, pleural effusion or pneumothorax. Advanced degenerative change of the left shoulder. IMPRESSION: Left lung base atelectasis. Thoracic aortic atherosclerosis. Electronically Signed   By: Rubye Oaks M.D.   On: 10/11/2016 00:08   Dg Chest Portable 1 View  Result Date: 11/02/2016 CLINICAL DATA:  Shortness of breath and cough EXAM: PORTABLE CHEST 1 VIEW COMPARISON:  10/23/2016 FINDINGS: Mild diffuse interstitial opacities suggestive of chronic change. This is slightly increased at the bilateral lung bases and could reflect superimposed interstitial inflammation. Hazy atelectasis left base with possible tiny effusion. Stable cardiomediastinal silhouette with atherosclerosis. No pneumothorax. IMPRESSION: 1. Slight increased interstitial opacities within the bilateral lung bases could reflect acute interstitial inflammation on chronic change. Mild left CP angle atelectasis with possible tiny effusion. 2. Atherosclerosis of the aorta Electronically Signed   By: Jasmine Pang M.D.   On: 11/02/2016 22:03   Dg Chest Port 1 View  Result Date: 10/23/2016 CLINICAL DATA:  Shortness of Breath EXAM: PORTABLE CHEST 1 VIEW  COMPARISON:  10/20/2016 FINDINGS: Cardiac shadow is stable. Aortic calcifications are again seen. Lungs are well aerated bilaterally. Interval clearing in the right base is noted. Some left basilar atelectasis is now seen however. Chronic degenerative change of the left shoulder joint is noted. No other focal abnormality is seen. IMPRESSION: Interval clearing of right basilar infiltrate. Left basilar atelectasis is now seen however. Electronically Signed   By: Alcide Clever M.D.   On: 10/23/2016 08:18   Dg Chest [facility] 1 View  Result  Date: 10/20/2016 CLINICAL DATA:  Shortness of breath. EXAM: PORTABLE CHEST 1 VIEW COMPARISON:  10/18/2016.  10/10/2016 . FINDINGS: Mediastinum hilar structures normal. Heart size normal. Low lung volumes with basilar atelectasis . Right base infiltrate suggesting pneumonia. No acute bony abnormality identified. IMPRESSION: Right base infiltrate noted consistent pneumonia. Low lung volumes with mild bibasilar atelectasis. Electronically Signed   By: Maisie Fus  Register   On: 10/20/2016 11:00   Dg Chest Port 1 View  Result Date: 10/18/2016 CLINICAL DATA:  Dyspnea EXAM: PORTABLE CHEST 1 VIEW COMPARISON:  10/10/2016 CXR FINDINGS: The heart size and mediastinal contours are within normal limits. Aortic atherosclerosis is noted. Patchy airspace disease at the right lung base suspicious for pneumonia. Subsegmental can platelike atelectasis at the left lung base. Osteoarthritis of the glenohumeral joints. IMPRESSION: Patchy new airspace opacity at the right lung base suspicious for pneumonia. Left lower lobe atelectasis. Electronically Signed   By: Tollie Eth M.D.   On: 10/18/2016 23:58    Lizmary Nader M.D on 11/04/2016 at 1:01 PM  Between 7am to 7pm - Pager - [phone]  After 7pm go to [email or web address] - password [facility]  [facility] -  Office  [phone]

## 2016-11-04 NOTE — Progress Notes (Signed)
ANTICOAGULATION CONSULT NOTE - Follow Up Consult  Pharmacy Consult for Heparin Indication: Recent pulmonary embolus, now off Xarelto (AKI)  Allergies  Allergen Reactions  . Tylenol [Acetaminophen] Other (See Comments)    Reaction:  Shaking     Patient Measurements: Height: 5\' 2"  (157.5 cm) Weight: 146 lb (66.2 kg) IBW/kg (Calculated) : 50.1 Heparin Dosing Weight:   Vital Signs: Temp: 97.8 F (36.6 C) (01/19 0400) Temp Source: Oral (01/19 0400) BP: 115/57 (01/19 0400) Pulse Rate: 76 (01/19 0400)  Labs:  Recent Labs  11/02/16 1817 11/03/16 0036 11/03/16 0503 11/03/16 1725 11/04/16 0521  HGB 8.0*  --  7.2*  --  9.0*  HCT 23.9*  --  22.4*  --  27.1*  PLT 235  --  191  --  186  APTT  --  48*  --  161* 176*  LABPROT  --  22.7*  --   --   --   INR  --  1.97  --   --   --   HEPARINUNFRC  --   --   --  1.08* 0.71*  CREATININE 1.93*  --  1.82*  --  2.27*    Estimated Creatinine Clearance: 15.9 mL/min (by C-G formula based on SCr of 2.27 mg/dL (H)).   Medications:  Infusions:  . 0.9 % NaCl with KCl 20 mEq / L    . heparin 750 Units/hr (11/03/16 1904)    Assessment: Patient with slightly high heparin level and high PTT.  Patient does have prior rivaroxaban use documented, but with heparin level just slightly above goal and PTT more elevated above goal, I don't believe rivaroxaban is elevating the heparin level any longer.  Will use only heparin level going forward.  No bleeding per RN.  Goal of Therapy:  Heparin level 0.3-0.7 units/ml Monitor platelets by anticoagulation protocol: Yes   Plan:  Decrease heparin to 700 units/hr Recheck level at [address]  Angila Wombles Jr, [place] 11/04/2016,7:20 AM

## 2016-11-04 NOTE — NC FL2 (Signed)
[place] MEDICAID FL2 LEVEL OF CARE SCREENING TOOL     IDENTIFICATION  Patient Name: Carrie BaizeJeanne R XXXMeyers Birthdate: [birth year] Sex: female Admission Date (Current Location): 11/02/2016  [facility] [place]   Facility and Address:  [facility],  [address], [place] [id number]      Provider Number: [id number]  Attending Physician Name and Address:  Starleen Armsawood S Elgergawy, MD  Relative Name and Phone Number:       Current Level of Care: Hospital Recommended Level of Care: Skilled Nursing Facility Prior Approval Number:    Date Approved/Denied:   PASRR Number: [id number]  Discharge Plan: SNF    Current Diagnoses: Patient Active Problem List   Diagnosis Date Noted  . Chronic respiratory failure with hypoxia (HCC) 11/02/2016  . Sepsis (HCC) 11/02/2016  . Influenza A 11/02/2016  . Hypokalemia 11/02/2016  . Hypotension 11/02/2016  . Influenza 11/02/2016  . Severe sepsis (HCC) 11/02/2016  . Renal insufficiency 10/19/2016  . PAD (peripheral artery disease) (HCC) 10/19/2016  . Decreased hemoglobin 10/18/2016  . AAA (abdominal aortic aneurysm) without rupture (HCC) 10/03/2016  . Anxiety state 10/03/2016  . History of pulmonary embolism 10/03/2016  . Hyperglycemia 10/03/2016  . Anemia   . COPD (chronic obstructive pulmonary disease) (HCC) 09/29/2016  . Chronic diastolic CHF (congestive heart failure) (HCC) 09/06/2016  . AKI (acute kidney injury) (HCC) 09/06/2016  . Iliopsoas muscle hematoma, left, initial encounter 09/01/2016  . Intractable pain 09/01/2016  . Macrocytic anemia 09/01/2016  . Essential hypertension, benign 08/22/2015  . COPD with acute exacerbation (HCC) 08/12/2015  . Acute on chronic respiratory failure with hypoxia (HCC) 08/12/2015  . CAP (community acquired pneumonia) 08/12/2015  . Pain of right lower extremity 05/06/2014  . Swelling of limb 05/06/2014  . Atherosclerosis of native arteries of the extremities with  intermittent claudication 07/18/2011    Orientation RESPIRATION BLADDER Height & Weight     Self, Time, Situation, Place  Normal Continent Weight: 146 lb (66.2 kg) Height:  5\' 2"  (157.5 cm)  BEHAVIORAL SYMPTOMS/MOOD NEUROLOGICAL BOWEL NUTRITION STATUS      Continent Diet (Heart)  AMBULATORY STATUS COMMUNICATION OF NEEDS Skin   Extensive Assist Verbally  (Wound / Incision (Open or Dehisced) 11/03/16 Other (Comment) Sacrum Medial)                       Personal Care Assistance Level of Assistance  Bathing, Dressing Bathing Assistance: Limited assistance   Dressing Assistance: Limited assistance     Functional Limitations Info             SPECIAL CARE FACTORS FREQUENCY  PT (By licensed PT), OT (By licensed OT)     PT Frequency: 5 OT Frequency: 5            Contractures      Additional Factors Info  Code Status, Allergies, Isolation Precautions Code Status Info: DNR Allergies Info:  Tylenol Acetaminophen     Isolation Precautions Info: Droplet - positive for Influenza A on 11/02/16, awaiting results of CDiff scan     Current Medications (11/04/2016):  This is the current hospital active medication list Current Facility-Administered Medications  Medication Dose Route Frequency Provider Last Rate Last Dose  . 0.9 % NaCl with KCl 20 mEq/ L  infusion   Intravenous Continuous Starleen Armsawood S Elgergawy, MD 75 mL/hr at 11/04/16 1104    . ALPRAZolam (XANAX) tablet 0.25 mg  0.25 mg Oral Q8H PRN Briscoe Deutscherimothy S Opyd, MD  0.25 mg at 11/03/16 2151  . aspirin EC tablet 81 mg  81 mg Oral Daily Briscoe Deutscher, MD   81 mg at 11/04/16 1107  . atorvastatin (LIPITOR) tablet 20 mg  20 mg Oral q1800 Briscoe Deutscher, MD   20 mg at 11/03/16 2151  . cilostazol (PLETAL) tablet 100 mg  100 mg Oral BID Briscoe Deutscher, MD   100 mg at 11/04/16 1106  . feeding supplement (PRO-STAT SUGAR FREE 64) liquid 30 mL  30 mL Oral BID Lavone Neri Opyd, MD   30 mL at 11/04/16 1105  . heparin ADULT infusion 100  units/mL (25000 units/280mL sodium chloride 0.45%)  700 Units/hr Intravenous Continuous Starleen Arms, MD 7 mL/hr at 11/04/16 0739 700 Units/hr at 11/04/16 0739  . HYDROmorphone (DILAUDID) injection 0.5 mg  0.5 mg Intravenous Q3H PRN Lavone Neri Opyd, MD      . ipratropium-albuterol (DUONEB) 0.5-2.5 (3) MG/3ML nebulizer solution 3 mL  3 mL Nebulization Q4H PRN Lavone Neri Opyd, MD      . ipratropium-albuterol (DUONEB) 0.5-2.5 (3) MG/3ML nebulizer solution 3 mL  3 mL Nebulization TID Briscoe Deutscher, MD   3 mL at 11/04/16 0754  . menthol-cetylpyridinium (CEPACOL) lozenge 3 mg  1 lozenge Oral PRN Leana Roe Elgergawy, MD      . methylPREDNISolone sodium succinate (SOLU-MEDROL) 125 mg/2 mL injection 60 mg  60 mg Intravenous Q6H Briscoe Deutscher, MD   60 mg at 11/04/16 0542  . mometasone-formoterol (DULERA) 200-5 MCG/ACT inhaler 2 puff  2 puff Inhalation BID Briscoe Deutscher, MD   2 puff at 11/04/16 0755  . ondansetron (ZOFRAN) tablet 4 mg  4 mg Oral Q6H PRN Briscoe Deutscher, MD       Or  . ondansetron (ZOFRAN) injection 4 mg  4 mg Intravenous Q6H PRN Briscoe Deutscher, MD      . oseltamivir (TAMIFLU) capsule 30 mg  30 mg Oral QHS Briscoe Deutscher, MD   30 mg at 11/03/16 2151  . oxyCODONE (Oxy IR/ROXICODONE) immediate release tablet 5 mg  5 mg Oral Q6H PRN Lavone Neri Opyd, MD      . pantoprazole (PROTONIX) EC tablet 40 mg  40 mg Oral BID AC Dawood S Elgergawy, MD      . piperacillin-tazobactam (ZOSYN) IVPB 2.25 g  2.25 g Intravenous Q6H Anh P Pham, RPH   2.25 g at 11/04/16 1000  . polyethylene glycol (MIRALAX / GLYCOLAX) packet 17 g  17 g Oral BID Briscoe Deutscher, MD   17 g at 11/04/16 1105  . potassium chloride (K-DUR,KLOR-CON) CR tablet 30 mEq  30 mEq Oral BID Starleen Arms, MD   30 mEq at 11/04/16 1106  . senna-docusate (Senokot-S) tablet 2 tablet  2 tablet Oral BID Briscoe Deutscher, MD   2 tablet at 11/04/16 1106  . sodium chloride flush (NS) 0.9 % injection 3 mL  3 mL Intravenous Q12H Briscoe Deutscher, MD   3 mL  at 11/04/16 1000     Discharge Medications: Please see discharge summary for a list of discharge medications.  Relevant Imaging Results:  Relevant Lab Results:   Additional Information SS# [id number]  Arlyss Repress, LCSW

## 2016-11-04 NOTE — Progress Notes (Signed)
Pt had 3+ type 7 stools this shift. Pt placed on Enteric precautions. Will inform dayshift RN to make MD aware during rounds. Carrie Pena, Malinda Mayden I

## 2016-11-04 NOTE — Progress Notes (Signed)
ANTICOAGULATION CONSULT NOTE - Follow-Up Consult  Pharmacy Consult for Heparin Indication: Recent pulmonary embolus, now off Xarelto (AKI)  Allergies  Allergen Reactions  . Tylenol [Acetaminophen] Other (See Comments)    Reaction:  Shaking     Patient Measurements: Height: 5\' 2"  (157.5 cm) Weight: 146 lb (66.2 kg) IBW/kg (Calculated) : 50.1   Vital Signs: Temp: 97.6 F (36.4 C) (01/19 1323) Temp Source: Oral (01/19 1323) BP: 116/56 (01/19 1323) Pulse Rate: 84 (01/19 1323)  Labs:  Recent Labs  11/02/16 1817 11/03/16 0036 11/03/16 0503 11/03/16 1725 11/04/16 0521 11/04/16 1556  HGB 8.0*  --  7.2*  --  9.0*  --   HCT 23.9*  --  22.4*  --  27.1*  --   PLT 235  --  191  --  186  --   APTT  --  48*  --  161* 176*  --   LABPROT  --  22.7*  --   --   --   --   INR  --  1.97  --   --   --   --   HEPARINUNFRC  --   --   --  1.08* 0.71* 0.55  CREATININE 1.93*  --  1.82*  --  2.27*  --     Estimated Creatinine Clearance: 15.9 mL/min (by C-G formula based on SCr of 2.27 mg/dL (H)).   Medical History: Past Medical History:  Diagnosis Date  . Carpal tunnel syndrome   . COPD (chronic obstructive pulmonary disease) (HCC)   . GERD (gastroesophageal reflux disease)   . Hyperlipidemia   . Hypertension   . Osteoarthritis     Assessment: 83 y/o F on Xarelto for pulmonary embolism diagnosed December 2017, admitted the night of 11/02/16 with sepsis, influenza A, acute on chronic respiratory failure (COPD), and AKI.  Date / time of last Xarelto dose are not recorded on med history, but on admission elevation in INR and aPTT were noted, which is consistent with recent Xarelto use.  Because estimated CrCl less than 30 mL/min, Xarelto was held on admission.  Orders subsequently received to begin heparin infusion with pharmacy dosing assistance. Originally using APTT to adjust heparin infusion rate; howerver, now that effects of Xarelto believed to have dissipated, using heparin levels  for adjustments moving forward.  Today, 11/04/16: -Heparin level at 1556 = 0.55 units/mL, therapeutic on heparin infusion at 700 units/hr -CBC: Hgb 9, improved s/p transfusion of 2 units PRBCs, Pltc WNL -No bleeding or line issues reported per nursing  Goal of Therapy:  Heparin level 0.3-0.7 units/ml Monitor platelets by anticoagulation protocol: Yes   Plan:   Continue heparin infusion at 700 units/hr.  Check confirmatory heparin level 1/20 at 0000.  Daily CBC and heparin level while on heparin infusion.  Monitor for s/sx of bleeding.  Follow clinical course.   Greer PickerelJigna Norville Dani, PharmD, BCPS Pager: [phone] 11/04/2016 5:01 PM

## 2016-11-04 NOTE — Progress Notes (Signed)
Pharmacy Antibiotic Note  Carrie Pena is a 83 y.o. female [facility], [facility] with c/o fever and hypotension. She tested +Influenza A and has been started on Tamiflu.  She continues on day#2 broad abx with Vancomycin and Zosyn due to high risk of PNA, co-infection.   11/04/2016:  Afebrile  WBC still elevated, but trending down  AKI: Scr trending up, est CrCl ~ 3515ml/min.  Min UOP charted.  Plan: - Decrease Zosyn 2.25gm IV q6h infused over 30 minutes for worsening renal function - Discontinue Vancomycin as discussed with Dr Randol KernElgergawy -Continue renally adjusted Tamiflu  - Monitor renal function and cx data  __________________________  Height: 5\' 2"  (157.5 cm) Weight: 146 lb (66.2 kg) IBW/kg (Calculated) : 50.1  Temp (24hrs), Avg:97.8 F (36.6 C), Min:97.6 F (36.4 C), Max:97.9 F (36.6 C)   Recent Labs Lab 11/02/16 1817 11/02/16 1823 11/03/16 0030 11/03/16 0503 11/04/16 0521  WBC 14.7*  --   --  13.3* 11.5*  CREATININE 1.93*  --   --  1.82* 2.27*  LATICACIDVEN  --  1.52 2.2*  --   --     Estimated Creatinine Clearance: 15.9 mL/min (by C-G formula based on SCr of 2.27 mg/dL (H)).    Allergies  Allergen Reactions  . Tylenol [Acetaminophen] Other (See Comments)    Reaction:  Shaking    Antimicrobials this admission:  1/17 vanc >> 1/19 1/17 zosyn>>  1/17 Tamiflu >>  Dose adjustments this admission:  1/19: Zosyn decreased to 2.25gm IV q6h infused over 30 min for CrCl<20  Microbiology results:  1/17 Urine: collected 1/17 Blood: NGTD 1/17 Infuenza A PCR: POSITIVE 1/18 MRSA PCR: negative  Thank you for allowing pharmacy to be a part of this patient's care.  Junita PushMichelle Quanda Pavlicek, PharmD, BCPS Pager: [phone] 11/04/2016 7:54 AM

## 2016-11-04 NOTE — Progress Notes (Signed)
CRITICAL VALUE ALERT  Critical value received:  APTT 176  Date of notification:  11-04-2016  Time of notification:  0706  Critical value read back:Yes.    Nurse who received alert:  Elisabeth PigeonMaria De Leon   MD notified (1st page):  Elgergawy  Time of first page:  0710  MD notified (2nd page):  Time of second page:  Responding MD:  Elgergawy   Time MD responded:  [phone]

## 2016-11-05 ENCOUNTER — Inpatient Hospital Stay (HOSPITAL_COMMUNITY): Payer: Medicare Other

## 2016-11-05 DIAGNOSIS — J9620 Acute and chronic respiratory failure, unspecified whether with hypoxia or hypercapnia: Secondary | ICD-10-CM

## 2016-11-05 LAB — CBC
HEMATOCRIT: 24.9 % — AB (ref 36.0–46.0)
Hemoglobin: 8.2 g/dL — ABNORMAL LOW (ref 12.0–15.0)
MCH: 29.7 pg (ref 26.0–34.0)
MCHC: 32.9 g/dL (ref 30.0–36.0)
MCV: 90.2 fL (ref 78.0–100.0)
Platelets: 202 10*3/uL (ref 150–400)
RBC: 2.76 MIL/uL — ABNORMAL LOW (ref 3.87–5.11)
RDW: 18.5 % — AB (ref 11.5–15.5)
WBC: 10.7 10*3/uL — AB (ref 4.0–10.5)

## 2016-11-05 LAB — BASIC METABOLIC PANEL
ANION GAP: 6 (ref 5–15)
BUN: 46 mg/dL — ABNORMAL HIGH (ref 6–20)
CALCIUM: 7.3 mg/dL — AB (ref 8.9–10.3)
CO2: 16 mmol/L — ABNORMAL LOW (ref 22–32)
Chloride: 113 mmol/L — ABNORMAL HIGH (ref 101–111)
Creatinine, Ser: 2.68 mg/dL — ABNORMAL HIGH (ref 0.44–1.00)
GFR calc Af Amer: 17 mL/min — ABNORMAL LOW (ref >60)
GFR calc non Af Amer: 15 mL/min — ABNORMAL LOW (ref >60)
GLUCOSE: 123 mg/dL — AB (ref 65–99)
Potassium: 4 mmol/L (ref 3.5–5.1)
Sodium: 135 mmol/L (ref 135–145)

## 2016-11-05 LAB — MRSA PCR SCREENING: MRSA BY PCR: NEGATIVE

## 2016-11-05 LAB — HEPARIN LEVEL (UNFRACTIONATED): Heparin Unfractionated: 0.39 IU/mL (ref 0.30–0.70)

## 2016-11-05 LAB — GLUCOSE, CAPILLARY: Glucose-Capillary: 111 mg/dL — ABNORMAL HIGH (ref 65–99)

## 2016-11-05 MED ORDER — LORAZEPAM 2 MG/ML IJ SOLN
0.5000 mg | Freq: Once | INTRAMUSCULAR | Status: AC
  Administered 2016-11-05: 0.5 mg via INTRAVENOUS
  Filled 2016-11-05: qty 1

## 2016-11-05 MED ORDER — FUROSEMIDE 10 MG/ML IJ SOLN
60.0000 mg | Freq: Once | INTRAMUSCULAR | Status: AC
  Administered 2016-11-05: 60 mg via INTRAVENOUS
  Filled 2016-11-05: qty 6

## 2016-11-05 MED ORDER — LORAZEPAM 2 MG/ML IJ SOLN
1.0000 mg | INTRAMUSCULAR | Status: AC | PRN
  Administered 2016-11-05 – 2016-11-06 (×3): 1 mg via INTRAVENOUS
  Filled 2016-11-05 (×3): qty 1

## 2016-11-05 MED ORDER — METHYLPREDNISOLONE SODIUM SUCC 125 MG IJ SOLR
80.0000 mg | Freq: Four times a day (QID) | INTRAMUSCULAR | Status: DC
  Administered 2016-11-05 – 2016-11-08 (×12): 80 mg via INTRAVENOUS
  Filled 2016-11-05 (×12): qty 2

## 2016-11-05 MED ORDER — CHLORHEXIDINE GLUCONATE 0.12 % MT SOLN
15.0000 mL | Freq: Two times a day (BID) | OROMUCOSAL | Status: DC
Start: 2016-11-05 — End: 2016-11-08
  Administered 2016-11-05 – 2016-11-08 (×4): 15 mL via OROMUCOSAL
  Filled 2016-11-05 (×3): qty 15

## 2016-11-05 MED ORDER — ORAL CARE MOUTH RINSE
15.0000 mL | Freq: Two times a day (BID) | OROMUCOSAL | Status: DC
  Administered 2016-11-06 – 2016-11-13 (×15): 15 mL via OROMUCOSAL

## 2016-11-05 MED ORDER — METHYLPREDNISOLONE SODIUM SUCC 125 MG IJ SOLR
125.0000 mg | INTRAMUSCULAR | Status: AC
  Administered 2016-11-05: 125 mg via INTRAVENOUS
  Filled 2016-11-05: qty 2

## 2016-11-05 NOTE — Progress Notes (Signed)
PROGRESS NOTE                                                                                                                                                                                                             Patient Demographics:    Carrie Pena, is a 83 y.o. female, DOB - [birth year], ZOX:[id number]  Admit date - 11/02/2016   Admitting Physician Briscoe Deutscher, MD  Outpatient Primary MD for the patient is Thayer Headings, MD  LOS - 3   Chief Complaint  Patient presents with  . Hypotension       Brief Narrative   83 y.o. female with medical history significant for COPD with chronic hypoxic respiratory failure, hypertension, peripheral arterial disease, and history of pulmonary embolism on Xarelto, now presenting from her nursing facility for evaluation of fevers, respiratory distress, and hypotension, Workup significant for influenza, Requiring BiPAP initially in ED, after that she is 4-5 L nasal cannula from baseline 3 L, on 1/20 patient with significant respiratory distress, sure to stepdown and resumed on BiPAP.   Subjective:    Carrie Pena today has, No headache, No chest pain, No abdominal pain -Reports shortness of breath.  Assessment  & Plan :    Principal Problem:   Sepsis (HCC) Active Problems:   Essential hypertension, benign   Chronic diastolic CHF (congestive heart failure) (HCC)   AKI (acute kidney injury) (HCC)   COPD (chronic obstructive pulmonary disease) (HCC)   History of pulmonary embolism   Anemia   Decreased hemoglobin   PAD (peripheral artery disease) (HCC)   Chronic respiratory failure with hypoxia (HCC)   Influenza A   Hypokalemia   Hypotension   Influenza   Severe sepsis (HCC)  Acute on chronic respiratory failure - Patient at baseline 3 L nasal cannula - Significant respiratory distress on admission and required BiPAP, weaned off BiPAP, has been on 4-5 L nasal cannula - This a.m. with  significant respiratory distress, significant wheezing, as well chest x-ray significant for volume overload. - Given steroid 125 mg  once, increased steroid dose, continue with DuoNeb, started on IV Lasix, will dose as needed given her renal failure, and started on BiPAP.  Sepsis, influenza A  - Pt meets sepsis criteria on admission, sxs primarily respiratory  - Blood and urine cultures  with no growth to date -  Empiric vancomycin and Zosyn initiated in ED, Started vancomycin 1/19 , continue Zosyn - PCR positive for influenza A and Tamiflu is started   COPD with acute exacerbation - Presents in respiratory distress with increased O2-requirement  - Started on systemic steroids with IV Solu-Medrol; continue nebs, supplemental O2    Acute kidney injury  - SCr is 1.93 on admission, up from apparent baseline of 0.8 ,18 continue trending up, today is 2.6. - Likely a prerenal azotemia in setting of acute infection with hypotension; ATN possible in this setting  - Avoid nephrotoxins, renally-dose medications,  - Renal ultrasound with no acute findings, check urine sodium, and creatinine  - Holding IV fluid given volume overload, will monitor closely as on IV diuresis .  Hx of pulmonary embolism - No evidence for acute VTE  - Managed with Xarelto, is on hold given adrenal failure, started on heparin GTT  Hypokalemia  - Repleted, monitor closely  PAD  - Stable; good cap refill in toes  -Resume cilostazol 1 stable  Anemia - Anemia of chronic disease , has been Hemoccult positive repeat his admission, but was unstable for EGD , she was transfused 2 units PRBC event, hemoglobin has been stable since , hemoglobin 7.2, transfused 2 units PRBC, with good response, today is 9 , giving her frail respiratory status, I don't think she will be able to have any GI workup yet, for now we'll continue to monitor and transfuse as needed.   Code Status : DO NOT RESUSCITATE/DO NOT INTUBATE  Family  Communication  : Discussed with /granddaughter daughter via phone 1/20, updated them about deterioration patient clinical status and transferred to stepdown, she remains DO NOT RESUSCITATE/DO NOT INTUBATE  Disposition Plan  : Back to SNF once stable  Consults  : None  Procedures  :  2 units PRBC1/18  DVT Prophylaxis  : Heparin gtt  Lab Results  Component Value Date   PLT 202 11/05/2016    Antibiotics  :    Anti-infectives    Start     Dose/Rate Route Frequency Ordered Stop   11/04/16 1800  vancomycin (VANCOCIN) IVPB 1000 mg/200 mL premix  Status:  Discontinued     1,000 mg 200 mL/hr over 60 Minutes Intravenous Every 48 hours 11/02/16 1907 11/04/16 0900   11/04/16 0900  piperacillin-tazobactam (ZOSYN) IVPB 2.25 g     2.25 g 100 mL/hr over 30 Minutes Intravenous Every 6 hours 11/04/16 0714     11/03/16 0100  piperacillin-tazobactam (ZOSYN) IVPB 3.375 g  Status:  Discontinued     3.375 g 12.5 mL/hr over 240 Minutes Intravenous Every 8 hours 11/02/16 1907 11/04/16 0714   11/02/16 2315  oseltamivir (TAMIFLU) capsule 30 mg     30 mg Oral Daily at bedtime 11/02/16 2251 11/07/16 2159   11/02/16 1830  piperacillin-tazobactam (ZOSYN) IVPB 3.375 g     3.375 g 100 mL/hr over 30 Minutes Intravenous  Once 11/02/16 1817 11/02/16 1904   11/02/16 1830  vancomycin (VANCOCIN) IVPB 1000 mg/200 mL premix     1,000 mg 200 mL/hr over 60 Minutes Intravenous  Once 11/02/16 1817 11/02/16 2148        Objective:   Vitals:   11/05/16 0440 11/05/16 0751 11/05/16 0910 11/05/16 1205  BP: 139/66 (!) 143/66 (!) 151/67 (!) 148/60  Pulse: 84  88 84  Resp: (!) 24 (!) 24 (!) 23   Temp: 97.5 F (36.4 C) 97.7 F (36.5 C) 98 F (36.7 C)   TempSrc: Oral Oral  Oral   SpO2: 99% 100% 96% 99%  Weight:      Height:        Wt Readings from Last 3 Encounters:  11/02/16 66.2 kg (146 lb)  10/25/16 66.2 kg (146 lb)  10/24/16 66.2 kg (146 lb)     Intake/Output Summary (Last 24 hours) at 11/05/16 1306 Last  data filed at 11/05/16 0935  Gross per 24 hour  Intake             1793 ml  Output                0 ml  Net             1793 ml     Physical Exam Confused, in moderate respiratory distress . Supple Neck,No JVD Symmetrical Chest wall movement, Good air movement bilaterally, diffuse wheezing, tachypneic, with use of accessory muscle  RRR,No Gallops,Rubs or new Murmurs, No Parasternal Heave +ve B.Sounds, Abd Soft, No tenderness,  No rebound - guarding or rigidity. No Cyanosis, Clubbing or edema, No new Rash or bruise      Data Review:    CBC  Recent Labs Lab 11/02/16 1817 11/03/16 0503 11/04/16 0521 11/05/16 0141  WBC 14.7* 13.3* 11.5* 10.7*  HGB 8.0* 7.2* 9.0* 8.2*  HCT 23.9* 22.4* 27.1* 24.9*  PLT 235 191 186 202  MCV 92.6 94.9 90.3 90.2  MCH 31.0 30.5 30.0 29.7  MCHC 33.5 32.1 33.2 32.9  RDW 18.3* 18.6* 17.1* 18.5*  LYMPHSABS 0.6*  --   --   --   MONOABS 0.3  --   --   --   EOSABS 0.0  --   --   --   BASOSABS 0.0  --   --   --     Chemistries   Recent Labs Lab 11/02/16 1817 11/03/16 0503 11/04/16 0521 11/05/16 0141  NA 131* 135 136 135  K 3.1* 3.5 3.2* 4.0  CL 103 110 111 113*  CO2 21* 17* 18* 16*  GLUCOSE 103* 195* 144* 123*  BUN 37* 33* 48* 46*  CREATININE 1.93* 1.82* 2.27* 2.68*  CALCIUM 6.6* 6.2* 7.1* 7.3*  MG  --  2.2  --   --   AST 34 26  --   --   ALT 16 15  --   --   ALKPHOS 51 46  --   --   BILITOT 0.7 0.4  --   --    ------------------------------------------------------------------------------------------------------------------ No results for input(s): CHOL, HDL, LDLCALC, TRIG, CHOLHDL, LDLDIRECT in the last 72 hours.  No results found for: HGBA1C ------------------------------------------------------------------------------------------------------------------ No results for input(s): TSH, T4TOTAL, T3FREE, THYROIDAB in the last 72 hours.  Invalid input(s):  FREET3 ------------------------------------------------------------------------------------------------------------------ No results for input(s): VITAMINB12, FOLATE, FERRITIN, TIBC, IRON, RETICCTPCT in the last 72 hours.  Coagulation profile  Recent Labs Lab 11/03/16 0036  INR 1.97    No results for input(s): DDIMER in the last 72 hours.  Cardiac Enzymes No results for input(s): CKMB, TROPONINI, MYOGLOBIN in the last 168 hours.  Invalid input(s): CK ------------------------------------------------------------------------------------------------------------------    Component Value Date/Time   BNP 54.1 10/21/2016 0553    Inpatient Medications  Scheduled Meds: . atorvastatin  20 mg Oral q1800  . feeding supplement (PRO-STAT SUGAR FREE 64)  30 mL Oral BID  . ipratropium-albuterol  3 mL Nebulization QID  . methylPREDNISolone (SOLU-MEDROL) injection  80 mg Intravenous Q6H  . mometasone-formoterol  2 puff Inhalation BID  . oseltamivir  30 mg Oral QHS  .  pantoprazole  40 mg Oral BID AC  . piperacillin-tazobactam (ZOSYN)  IV  2.25 g Intravenous Q6H  . polyethylene glycol  17 g Oral BID  . senna-docusate  2 tablet Oral BID  . sodium chloride flush  3 mL Intravenous Q12H   Continuous Infusions: . heparin 700 Units/hr (11/05/16 0900)   PRN Meds:.ALPRAZolam, HYDROmorphone (DILAUDID) injection, ipratropium-albuterol, menthol-cetylpyridinium, ondansetron **OR** ondansetron (ZOFRAN) IV, oxyCODONE  Micro Results Recent Results (from the past 240 hour(s))  Blood Culture (routine x 2)     Status: None (Preliminary result)   Collection Time: 11/02/16  6:41 PM  Result Value Ref Range Status   Specimen Description BLOOD BLOOD RIGHT WRIST  Final   Special Requests BOTTLES DRAWN AEROBIC AND ANAEROBIC 10CC  Final   Culture   Final    NO GROWTH 2 DAYS Performed at [facility], [address] [place], [place] [address]    Report Status PENDING  Incomplete  Blood Culture  (routine x 2)     Status: None (Preliminary result)   Collection Time: 11/02/16  6:41 PM  Result Value Ref Range Status   Specimen Description BLOOD BLOOD RIGHT WRIST  Final   Special Requests BOTTLES DRAWN AEROBIC AND ANAEROBIC 10CC  Final   Culture   Final    NO GROWTH 2 DAYS Performed at [facility], [address], [place], [place] [address]    Report Status PENDING  Incomplete  Urine culture     Status: None   Collection Time: 11/02/16 11:16 PM  Result Value Ref Range Status   Specimen Description URINE, CATHETERIZED  Final   Special Requests NONE  Final   Culture   Final    NO GROWTH Performed at [facility], [address] [place], [place] [address]    Report Status 11/04/2016 FINAL  Final  MRSA PCR Screening     Status: None   Collection Time: 11/03/16  7:17 PM  Result Value Ref Range Status   MRSA by PCR NEGATIVE NEGATIVE Final    Comment:        The GeneXpert MRSA Assay (FDA approved for NASAL specimens only), is one component of a comprehensive MRSA colonization surveillance program. It is not intended to diagnose MRSA infection nor to guide or monitor treatment for MRSA infections.   MRSA PCR Screening     Status: None   Collection Time: 11/05/16  9:35 AM  Result Value Ref Range Status   MRSA by PCR NEGATIVE NEGATIVE Final    Comment:        The GeneXpert MRSA Assay (FDA approved for NASAL specimens only), is one component of a comprehensive MRSA colonization surveillance program. It is not intended to diagnose MRSA infection nor to guide or monitor treatment for MRSA infections.     Radiology Reports Dg Chest 2 View  Result Date: 10/11/2016 CLINICAL DATA:  Cough and shortness of breath tonight. Recent diagnosis of pulmonary embolus. EXAM: CHEST  2 VIEW COMPARISON:  Radiograph 03/02/2016, CT 10/03/2016 FINDINGS: Lower lung volumes from prior exam. Mild emphysema. Tortuous thoracic aorta with atherosclerosis. The heart is normal in  size. No pulmonary edema. Minimal left infrahilar atelectasis. No confluent airspace disease, pleural effusion or pneumothorax. Advanced degenerative change of the left shoulder. IMPRESSION: Left lung base atelectasis. Thoracic aortic atherosclerosis. Electronically Signed   By: Rubye Oaks M.D.   On: 10/11/2016 00:08   US Renal  Result Date: 11/05/2016 CLINICAL DATA:  Renal failure EXAM: RENAL /  URINARY TRACT ULTRASOUND COMPLETE COMPARISON:  CT scan September 30, 2016 FINDINGS: Right Kidney: Length: 10.1 cm. Nonshadowing echogenic foci may represent vascular calcifications seen on recent CT imaging. No hydronephrosis or acute abnormality. Left Kidney: Length: 10.5 cm. Bilateral echogenic nonshadowing foci may represent vascular calcifications as above. No hydronephrosis or acute abnormality. Bladder: Bladder not visualized at this time. IMPRESSION: 1. No cause for renal failure identified. The bladder is decompressed and not evaluated. 2. Small left pleural effusion. Electronically Signed   By: Gerome Sam III M.D   On: 11/05/2016 11:07   Dg Chest Port 1 View  Result Date: 11/05/2016 CLINICAL DATA:  Increasing dyspnea. EXAM: PORTABLE CHEST 1 VIEW COMPARISON:  November 02, 2016 FINDINGS: Prominent skin fold over the left upper chest. Small bilateral pleural effusions and increased interstitial markings. No change in the cardiomediastinal silhouette. Chronic degenerative changes in the left shoulder. IMPRESSION: 1. Small pleural effusions and increased interstitial markings, suggesting mild edema. However, the patient does not have cardiomegaly. Recommend clinical correlation and follow-up to resolution. Electronically Signed   By: Gerome Sam III M.D   On: 11/05/2016 09:14   Dg Chest Portable 1 View  Result Date: 11/02/2016 CLINICAL DATA:  Shortness of breath and cough EXAM: PORTABLE CHEST 1 VIEW COMPARISON:  10/23/2016 FINDINGS: Mild diffuse interstitial opacities suggestive of chronic  change. This is slightly increased at the bilateral lung bases and could reflect superimposed interstitial inflammation. Hazy atelectasis left base with possible tiny effusion. Stable cardiomediastinal silhouette with atherosclerosis. No pneumothorax. IMPRESSION: 1. Slight increased interstitial opacities within the bilateral lung bases could reflect acute interstitial inflammation on chronic change. Mild left CP angle atelectasis with possible tiny effusion. 2. Atherosclerosis of the aorta Electronically Signed   By: Jasmine Pang M.D.   On: 11/02/2016 22:03   Dg Chest Port 1 View  Result Date: 10/23/2016 CLINICAL DATA:  Shortness of Breath EXAM: PORTABLE CHEST 1 VIEW COMPARISON:  10/20/2016 FINDINGS: Cardiac shadow is stable. Aortic calcifications are again seen. Lungs are well aerated bilaterally. Interval clearing in the right base is noted. Some left basilar atelectasis is now seen however. Chronic degenerative change of the left shoulder joint is noted. No other focal abnormality is seen. IMPRESSION: Interval clearing of right basilar infiltrate. Left basilar atelectasis is now seen however. Electronically Signed   By: Alcide Clever M.D.   On: 10/23/2016 08:18   Dg Chest Port 1 View  Result Date: 10/20/2016 CLINICAL DATA:  Shortness of breath. EXAM: PORTABLE CHEST 1 VIEW COMPARISON:  10/18/2016.  10/10/2016 . FINDINGS: Mediastinum hilar structures normal. Heart size normal. Low lung volumes with basilar atelectasis . Right base infiltrate suggesting pneumonia. No acute bony abnormality identified. IMPRESSION: Right base infiltrate noted consistent pneumonia. Low lung volumes with mild bibasilar atelectasis. Electronically Signed   By: Maisie Fus  Register   On: 10/20/2016 11:00   Dg Chest Port 1 View  Result Date: 10/18/2016 CLINICAL DATA:  Dyspnea EXAM: PORTABLE CHEST 1 VIEW COMPARISON:  10/10/2016 CXR FINDINGS: The heart size and mediastinal contours are within normal limits. Aortic atherosclerosis is  noted. Patchy airspace disease at the right lung base suspicious for pneumonia. Subsegmental can platelike atelectasis at the left lung base. Osteoarthritis of the glenohumeral joints. IMPRESSION: Patchy new airspace opacity at the right lung base suspicious for pneumonia. Left lower lobe atelectasis. Electronically Signed   By: Tollie Eth M.D.   On: 10/18/2016 23:58    Giavanni Zeitlin M.D on 11/05/2016 at 1:06 PM  Between 7am to 7pm -  Pager - [phone]  After 7pm go to [email or web address] - password [facility]  [facility] -  Office  [phone]

## 2016-11-05 NOTE — Progress Notes (Signed)
Pt agered to let RT place BIPAP on pt.

## 2016-11-05 NOTE — Progress Notes (Signed)
bipap attempt when pt arrived on ICU. Pt did not tolerate. Gave PRN pain med and pt still not tolerating bipap. Notified MD. Will continue to monitor pt closely.

## 2016-11-05 NOTE — Progress Notes (Signed)
Rt placed pt on  BIPAP. Pt pulling at mask yelling take it off. Rt explained to pt she needs it for now to help with WOB. Pt yelling I don't want to breath. Rt placed pt back on 4 LPM Tira. Pt knows and understands her condition.

## 2016-11-05 NOTE — Progress Notes (Signed)
ANTICOAGULATION CONSULT NOTE - Follow Up Consult  Pharmacy Consult for heparin Indication: Recent pulmonary embolus, now off Xarelto (AKI)  Allergies  Allergen Reactions  . Tylenol [Acetaminophen] Other (See Comments)    Reaction:  Shaking     Patient Measurements: Height: 5\' 2"  (157.5 cm) Weight: 146 lb (66.2 kg) IBW/kg (Calculated) : 50.1 Heparin Dosing Weight:   Vital Signs: Temp: 97.5 F (36.4 C) (01/19 2002) Temp Source: Oral (01/19 2002) BP: 120/54 (01/19 2002) Pulse Rate: 93 (01/19 2002)  Labs:  Recent Labs  11/03/16 0036 11/03/16 0503  11/03/16 1725 11/04/16 0521 11/04/16 1556 11/05/16 0141  HGB  --  7.2*  --   --  9.0*  --  8.2*  HCT  --  22.4*  --   --  27.1*  --  24.9*  PLT  --  191  --   --  186  --  202  APTT 48*  --   --  161* 176*  --   --   LABPROT 22.7*  --   --   --   --   --   --   INR 1.97  --   --   --   --   --   --   HEPARINUNFRC  --   --   < > 1.08* 0.71* 0.55 0.39  CREATININE  --  1.82*  --   --  2.27*  --  2.68*  < > = values in this interval not displayed.  Estimated Creatinine Clearance: 13.4 mL/min (by C-G formula based on SCr of 2.68 mg/dL (H)).   Medications:  Infusions:  . 0.9 % NaCl with KCl 20 mEq / L 75 mL/hr at 11/05/16 0349  . heparin 700 Units/hr (11/04/16 2006)    Assessment: Patient with heparin level at goal.  No heparin issues noted.  Goal of Therapy:  Heparin level 0.3-0.7 units/ml Monitor platelets by anticoagulation protocol: Yes   Plan:  Continue heparin drip at current rate Recheck level with 1/21 AM labs  Darlina GuysGrimsley Jr, Jacquenette ShoneJulian Crowford 11/05/2016,3:52 AM

## 2016-11-06 ENCOUNTER — Inpatient Hospital Stay (HOSPITAL_COMMUNITY): Payer: Medicare Other

## 2016-11-06 LAB — GLUCOSE, CAPILLARY: GLUCOSE-CAPILLARY: 105 mg/dL — AB (ref 65–99)

## 2016-11-06 LAB — BASIC METABOLIC PANEL
ANION GAP: 8 (ref 5–15)
BUN: 55 mg/dL — AB (ref 6–20)
CO2: 16 mmol/L — ABNORMAL LOW (ref 22–32)
Calcium: 7.6 mg/dL — ABNORMAL LOW (ref 8.9–10.3)
Chloride: 115 mmol/L — ABNORMAL HIGH (ref 101–111)
Creatinine, Ser: 2.88 mg/dL — ABNORMAL HIGH (ref 0.44–1.00)
GFR, EST AFRICAN AMERICAN: 16 mL/min — AB (ref >60)
GFR, EST NON AFRICAN AMERICAN: 14 mL/min — AB (ref >60)
Glucose, Bld: 100 mg/dL — ABNORMAL HIGH (ref 65–99)
POTASSIUM: 4.7 mmol/L (ref 3.5–5.1)
SODIUM: 139 mmol/L (ref 135–145)

## 2016-11-06 LAB — CBC
HCT: 26.4 % — ABNORMAL LOW (ref 36.0–46.0)
HEMOGLOBIN: 8.7 g/dL — AB (ref 12.0–15.0)
MCH: 30.7 pg (ref 26.0–34.0)
MCHC: 33 g/dL (ref 30.0–36.0)
MCV: 93.3 fL (ref 78.0–100.0)
Platelets: 210 10*3/uL (ref 150–400)
RBC: 2.83 MIL/uL — AB (ref 3.87–5.11)
RDW: 18.7 % — ABNORMAL HIGH (ref 11.5–15.5)
WBC: 7.8 10*3/uL (ref 4.0–10.5)

## 2016-11-06 LAB — HEPARIN LEVEL (UNFRACTIONATED): HEPARIN UNFRACTIONATED: 0.3 [IU]/mL (ref 0.30–0.70)

## 2016-11-06 MED ORDER — HYDROMORPHONE HCL 1 MG/ML IJ SOLN
0.5000 mg | INTRAMUSCULAR | Status: DC | PRN
  Administered 2016-11-06 – 2016-11-08 (×11): 0.5 mg via INTRAVENOUS
  Filled 2016-11-06 (×11): qty 0.5

## 2016-11-06 MED ORDER — PANTOPRAZOLE SODIUM 40 MG IV SOLR
40.0000 mg | Freq: Two times a day (BID) | INTRAVENOUS | Status: DC
  Administered 2016-11-06 – 2016-11-07 (×4): 40 mg via INTRAVENOUS
  Filled 2016-11-06 (×4): qty 40

## 2016-11-06 MED ORDER — HEPARIN (PORCINE) IN NACL 100-0.45 UNIT/ML-% IJ SOLN
750.0000 [IU]/h | INTRAMUSCULAR | Status: DC
  Administered 2016-11-07: 750 [IU]/h via INTRAVENOUS
  Filled 2016-11-06 (×3): qty 250

## 2016-11-06 MED ORDER — LIDOCAINE 5 % EX PTCH
1.0000 | MEDICATED_PATCH | CUTANEOUS | Status: DC
  Administered 2016-11-06 – 2016-11-12 (×7): 1 via TRANSDERMAL
  Filled 2016-11-06 (×8): qty 1

## 2016-11-06 MED ORDER — OSELTAMIVIR PHOSPHATE 30 MG PO CAPS
30.0000 mg | ORAL_CAPSULE | Freq: Every day | ORAL | Status: DC
  Administered 2016-11-06 – 2016-11-07 (×2): 30 mg via ORAL
  Filled 2016-11-06 (×3): qty 1

## 2016-11-06 NOTE — Progress Notes (Addendum)
ANTICOAGULATION CONSULT NOTE - Follow-Up Consult  Pharmacy Consult for Heparin Indication: Recent pulmonary embolus, now off Xarelto (AKI)  Allergies  Allergen Reactions  . Tylenol [Acetaminophen] Other (See Comments)    Reaction:  Shaking     Patient Measurements: Height: 5\' 2"  (157.5 cm) Weight: 146 lb (66.2 kg) IBW/kg (Calculated) : 50.1   Vital Signs: Temp: 97.1 F (36.2 C) (01/21 0400) Temp Source: Axillary (01/21 0400) BP: 165/78 (01/21 0500) Pulse Rate: 81 (01/21 0500)  Labs:  Recent Labs  11/03/16 1725  11/04/16 0521 11/04/16 1556 11/05/16 0141 11/06/16 0348  HGB  --   < > 9.0*  --  8.2* 8.7*  HCT  --   --  27.1*  --  24.9* 26.4*  PLT  --   --  186  --  202 210  APTT 161*  --  176*  --   --   --   HEPARINUNFRC 1.08*  --  0.71* 0.55 0.39 0.30  CREATININE  --   --  2.27*  --  2.68* 2.88*  < > = values in this interval not displayed.  Estimated Creatinine Clearance: 12.5 mL/min (by C-G formula based on SCr of 2.88 mg/dL (H)).   Medical History: Past Medical History:  Diagnosis Date  . Carpal tunnel syndrome   . COPD (chronic obstructive pulmonary disease) (HCC)   . GERD (gastroesophageal reflux disease)   . Hyperlipidemia   . Hypertension   . Osteoarthritis     Assessment: 83 y/o F on Xarelto for pulmonary embolism diagnosed December 2017, admitted the night of 11/02/16 with sepsis, influenza A, acute on chronic respiratory failure (COPD), and AKI.  Date / time of last Xarelto dose are not recorded on med history, but on admission elevation in INR and aPTT were noted, which is consistent with recent Xarelto use.  Because estimated CrCl less than 30 mL/min, Xarelto was held on admission.  Orders subsequently received to begin heparin infusion with pharmacy dosing assistance. Originally using APTT to adjust heparin infusion rate; however, now that effects of Xarelto believed to have dissipated, using heparin levels for adjustments moving forward.  Today,  11/06/16:  Heparin level this am = 0.3 units/mL, therapeutic on heparin infusion at 700 units/hr.  Level noted to be trending down to low end goal  CBC: Hgb low/stable, Pltc WNL  SCr worsening - renal U/S unrevealing  No obvious bleeding  Goal of Therapy:  Heparin level 0.3-0.7 units/ml Monitor platelets by anticoagulation protocol: Yes   Plan:   Increase heparin infusion to 750 units/hr.  Daily CBC and heparin level while on heparin infusion.  Monitor for s/sx of bleeding.  Follow clinical course.  If renal function does not improve, then will need to consider alternative anticoagulant  Juliette Alcideustin Sila Sarsfield, PharmD, BCPS.   Pager: [phone] 11/06/2016 8:32 AM

## 2016-11-06 NOTE — Progress Notes (Signed)
PROGRESS NOTE                                                                                                                                                                                                             Patient Demographics:    Carrie Pena, is a 83 y.o. female, DOB - 19-Oct-1927, WUJ:[id number]  Admit date - 11/02/2016   Admitting Physician Briscoe Deutscher, MD  Outpatient Primary MD for the patient is Thayer Headings, MD  LOS - 4   Chief Complaint  Patient presents with  . Hypotension       Brief Narrative   83 y.o. female with medical history significant for COPD with chronic hypoxic respiratory failure, hypertension, peripheral arterial disease, and history of pulmonary embolism on Xarelto, now presenting from her nursing facility for evaluation of fevers, respiratory distress, and hypotension, Workup significant for influenza, Requiring BiPAP initially in ED, after that she is 4-5 L nasal cannula from baseline 3 L, on 1/20 patient with significant respiratory distress, transfered to stepdown and resumed on BiPAP.   Subjective:    [name] today has, No headache, No chest pain, No abdominal pain -Reports shortness of breath.  Assessment  & Plan :    Principal Problem:   Sepsis (HCC) Active Problems:   Essential hypertension, benign   Chronic diastolic CHF (congestive heart failure) (HCC)   AKI (acute kidney injury) (HCC)   COPD (chronic obstructive pulmonary disease) (HCC)   History of pulmonary embolism   Anemia   Decreased hemoglobin   PAD (peripheral artery disease) (HCC)   Chronic respiratory failure with hypoxia (HCC)   Influenza A   Hypokalemia   Hypotension   Influenza   Severe sepsis (HCC)  Acute on chronic respiratory failure - Patient at baseline 3 L nasal cannula - Significant respiratory distress on admission and required BiPAP, weaned off BiPAP, has been on 4-5 L nasal cannula - On 1/20 a.m. with significant respiratory  distress, transferred to stepdown, resumed on BiPAP, remains altered, in respiratory distress, continue with BiPAP, current respiratory failure secondary to influenza, and COPD exacerbation.  Sepsis, influenza A  - Pt meets sepsis criteria on admission, sxs primarily respiratory  - Blood and urine cultures  with no growth to date - Empiric vancomycin and Zosyn initiated in ED, Stopped vancomycin 1/19 , continue Zosyn - PCR positive for influenza A and Tamiflu is started , giving it also mental status, not able to take Tamiflu yesterday evening, if remains altered during the day, will need NG tube for medication.  COPD with acute exacerbation - Presents in respiratory distress with increased O2-requirement  - Started on systemic steroids with IV Solu-Medrol; continue nebs, supplemental O2    Acute kidney injury  - SCr is 1.93 on admission, up from apparent baseline of 0.8 ,18 continue trending up, today is 2.8. - Likely a prerenal azotemia in setting of acute infection with hypotension; ATN possible in this setting  - Avoid nephrotoxins, renally-dose medications,  - Renal ultrasound with no acute findings, check urine sodium, and creatinine  - Holding IV fluid given volume overload, will monitor closely as on IV diuresis .  Hx of pulmonary embolism - No evidence for acute VTE  - Managed with Xarelto, is on hold given adrenal failure, currently  on heparin GTT  Hypokalemia  - Repleted, monitor closely  PAD  - Stable; good cap refill in toes  -Resume cilostazol when stable  Anemia - Anemia of chronic disease , has been Hemoccult positive repeat his admission, but was unstable for EGD , she was transfused 2 units PRBC event, hemoglobin has been stable since , hemoglobin 7.2, transfused 2 units PRBC, with good response, today is 9 , giving her frail respiratory status, I don't think she will be able to have any GI workup yet, for now we'll continue to monitor and transfuse as  needed.  Abnormal urinalysis - No urinary symptoms, negative urine culture, no indication to treat  Code Status : DO NOT RESUSCITATE/DO NOT INTUBATE  Family Communication  : Discussed with daughter at bedside, discussed with her that prognosis appears to be poor , requested palliative medicine consult .  Disposition Plan  : Back to SNF once stable  Consults  : None  Procedures  :  2 units PRBC1/18  DVT Prophylaxis  : Heparin gtt  Lab Results  Component Value Date   PLT 210 11/06/2016    Antibiotics  :    Anti-infectives    Start     Dose/Rate Route Frequency Ordered Stop   11/04/16 1800  vancomycin (VANCOCIN) IVPB 1000 mg/200 mL premix  Status:  Discontinued     1,000 mg 200 mL/hr over 60 Minutes Intravenous Every 48 hours 11/02/16 1907 11/04/16 0900   11/04/16 0900  piperacillin-tazobactam (ZOSYN) IVPB 2.25 g     2.25 g 100 mL/hr over 30 Minutes Intravenous Every 6 hours 11/04/16 0714     11/03/16 0100  piperacillin-tazobactam (ZOSYN) IVPB 3.375 g  Status:  Discontinued     3.375 g 12.5 mL/hr over 240 Minutes Intravenous Every 8 hours 11/02/16 1907 11/04/16 0714   11/02/16 2315  oseltamivir (TAMIFLU) capsule 30 mg     30 mg Oral Daily at bedtime 11/02/16 2251 11/07/16 2159   11/02/16 1830  piperacillin-tazobactam (ZOSYN) IVPB 3.375 g     3.375 g 100 mL/hr over 30 Minutes Intravenous  Once 11/02/16 1817 11/02/16 1904   11/02/16 1830  vancomycin (VANCOCIN) IVPB 1000 mg/200 mL premix     1,000 mg 200 mL/hr over 60 Minutes Intravenous  Once 11/02/16 1817 11/02/16 2148        Objective:   Vitals:   11/06/16 0500 11/06/16 0629 11/06/16 0800 11/06/16 0955  BP: (!) 165/78  130/73   Pulse: 81   85  Resp: 13  13 15   Temp:   97.4 F (36.3 C)   TempSrc:   Oral   SpO2: 99% 97% 100% 97%  Weight:      Height:        Wt Readings from Last  3 Encounters:  11/02/16 66.2 kg (146 lb)  10/25/16 66.2 kg (146 lb)  10/24/16 66.2 kg (146 lb)     Intake/Output Summary (Last  24 hours) at 11/06/16 1138 Last data filed at 11/06/16 0800  Gross per 24 hour  Intake            303.5 ml  Output              575 ml  Net           -271.5 ml     Physical Exam More lethargic, hard to arouse today, remains on BiPAP. Supple Neck,No JVD Symmetrical Chest wall movement, Good air movement bilaterally, wheezing improved today, no crackles,some use of accessory muscle  RRR,No Gallops,Rubs or new Murmurs, No Parasternal Heave +ve B.Sounds, Abd Soft, No tenderness,  No rebound - guarding or rigidity. No Cyanosis, Clubbing , No new Rash or bruise , +1 edema    Data Review:    CBC  Recent Labs Lab 11/02/16 1817 11/03/16 0503 11/04/16 0521 11/05/16 0141 11/06/16 0348  WBC 14.7* 13.3* 11.5* 10.7* 7.8  HGB 8.0* 7.2* 9.0* 8.2* 8.7*  HCT 23.9* 22.4* 27.1* 24.9* 26.4*  PLT 235 191 186 202 210  MCV 92.6 94.9 90.3 90.2 93.3  MCH 31.0 30.5 30.0 29.7 30.7  MCHC 33.5 32.1 33.2 32.9 33.0  RDW 18.3* 18.6* 17.1* 18.5* 18.7*  LYMPHSABS 0.6*  --   --   --   --   MONOABS 0.3  --   --   --   --   EOSABS 0.0  --   --   --   --   BASOSABS 0.0  --   --   --   --     Chemistries   Recent Labs Lab 11/02/16 1817 11/03/16 0503 11/04/16 0521 11/05/16 0141 11/06/16 0348  NA 131* 135 136 135 139  K 3.1* 3.5 3.2* 4.0 4.7  CL 103 110 111 113* 115*  CO2 21* 17* 18* 16* 16*  GLUCOSE 103* 195* 144* 123* 100*  BUN 37* 33* 48* 46* 55*  CREATININE 1.93* 1.82* 2.27* 2.68* 2.88*  CALCIUM 6.6* 6.2* 7.1* 7.3* 7.6*  MG  --  2.2  --   --   --   AST 34 26  --   --   --   ALT 16 15  --   --   --   ALKPHOS 51 46  --   --   --   BILITOT 0.7 0.4  --   --   --    ------------------------------------------------------------------------------------------------------------------ No results for input(s): CHOL, HDL, LDLCALC, TRIG, CHOLHDL, LDLDIRECT in the last 72 hours.  No results found for:  HGBA1C ------------------------------------------------------------------------------------------------------------------ No results for input(s): TSH, T4TOTAL, T3FREE, THYROIDAB in the last 72 hours.  Invalid input(s): FREET3 ------------------------------------------------------------------------------------------------------------------ No results for input(s): VITAMINB12, FOLATE, FERRITIN, TIBC, IRON, RETICCTPCT in the last 72 hours.  Coagulation profile  Recent Labs Lab 11/03/16 0036  INR 1.97    No results for input(s): DDIMER in the last 72 hours.  Cardiac Enzymes No results for input(s): CKMB, TROPONINI, MYOGLOBIN in the last 168 hours.  Invalid input(s): CK ------------------------------------------------------------------------------------------------------------------    Component Value Date/Time   BNP 54.1 10/21/2016 0553    Inpatient Medications  Scheduled Meds: . atorvastatin  20 mg Oral q1800  . chlorhexidine  15 mL Mouth Rinse BID  . feeding supplement (PRO-STAT SUGAR FREE 64)  30 mL Oral BID  . ipratropium-albuterol  3 mL Nebulization  QID  . lidocaine  1 patch Transdermal Q24H  . mouth rinse  15 mL Mouth Rinse q12n4p  . methylPREDNISolone (SOLU-MEDROL) injection  80 mg Intravenous Q6H  . mometasone-formoterol  2 puff Inhalation BID  . oseltamivir  30 mg Oral QHS  . pantoprazole (PROTONIX) IV  40 mg Intravenous Q12H  . piperacillin-tazobactam (ZOSYN)  IV  2.25 g Intravenous Q6H  . polyethylene glycol  17 g Oral BID  . senna-docusate  2 tablet Oral BID  . sodium chloride flush  3 mL Intravenous Q12H   Continuous Infusions: . heparin 750 Units/hr (11/06/16 0837)   PRN Meds:.ALPRAZolam, ipratropium-albuterol, menthol-cetylpyridinium, ondansetron **OR** ondansetron (ZOFRAN) IV, oxyCODONE  Micro Results Recent Results (from the past 240 hour(s))  Blood Culture (routine x 2)     Status: None (Preliminary result)   Collection Time: 11/02/16  6:41 PM   Result Value Ref Range Status   Specimen Description BLOOD BLOOD RIGHT WRIST  Final   Special Requests BOTTLES DRAWN AEROBIC AND ANAEROBIC 10CC  Final   Culture   Final    NO GROWTH 2 DAYS Performed at [facility], [address] [place], [place] [address]    Report Status PENDING  Incomplete  Blood Culture (routine x 2)     Status: None (Preliminary result)   Collection Time: 11/02/16  6:41 PM  Result Value Ref Range Status   Specimen Description BLOOD BLOOD RIGHT WRIST  Final   Special Requests BOTTLES DRAWN AEROBIC AND ANAEROBIC 10CC  Final   Culture   Final    NO GROWTH 2 DAYS Performed at [facility], [address], [place], [place] [address]    Report Status PENDING  Incomplete  Urine culture     Status: None   Collection Time: 11/02/16 11:16 PM  Result Value Ref Range Status   Specimen Description URINE, CATHETERIZED  Final   Special Requests NONE  Final   Culture   Final    NO GROWTH Performed at [facility], [address] [place], [place] [address]    Report Status 11/04/2016 FINAL  Final  MRSA PCR Screening     Status: None   Collection Time: 11/03/16  7:17 PM  Result Value Ref Range Status   MRSA by PCR NEGATIVE NEGATIVE Final    Comment:        The GeneXpert MRSA Assay (FDA approved for NASAL specimens only), is one component of a comprehensive MRSA colonization surveillance program. It is not intended to diagnose MRSA infection nor to guide or monitor treatment for MRSA infections.   MRSA PCR Screening     Status: None   Collection Time: 11/05/16  9:35 AM  Result Value Ref Range Status   MRSA by PCR NEGATIVE NEGATIVE Final    Comment:        The GeneXpert MRSA Assay (FDA approved for NASAL specimens only), is one component of a comprehensive MRSA colonization surveillance program. It is not intended to diagnose MRSA infection nor to guide or monitor treatment for MRSA infections.     Radiology Reports Dg Chest 2  View  Result Date: 10/11/2016 CLINICAL DATA:  Cough and shortness of breath tonight. Recent diagnosis of pulmonary embolus. EXAM: CHEST  2 VIEW COMPARISON:  Radiograph 03/02/2016, CT 10/03/2016 FINDINGS: Lower lung volumes from prior exam. Mild emphysema. Tortuous thoracic aorta with atherosclerosis. The heart is normal in size. No pulmonary edema. Minimal left infrahilar atelectasis. No confluent airspace disease, pleural effusion or pneumothorax.  Advanced degenerative change of the left shoulder. IMPRESSION: Left lung base atelectasis. Thoracic aortic atherosclerosis. Electronically Signed   By: Rubye Oaks M.D.   On: 10/11/2016 00:08   US Renal  Result Date: 11/05/2016 CLINICAL DATA:  Renal failure EXAM: RENAL / URINARY TRACT ULTRASOUND COMPLETE COMPARISON:  CT scan September 30, 2016 FINDINGS: Right Kidney: Length: 10.1 cm. Nonshadowing echogenic foci may represent vascular calcifications seen on recent CT imaging. No hydronephrosis or acute abnormality. Left Kidney: Length: 10.5 cm. Bilateral echogenic nonshadowing foci may represent vascular calcifications as above. No hydronephrosis or acute abnormality. Bladder: Bladder not visualized at this time. IMPRESSION: 1. No cause for renal failure identified. The bladder is decompressed and not evaluated. 2. Small left pleural effusion. Electronically Signed   By: Gerome Sam III M.D   On: 11/05/2016 11:07   Dg Chest Port 1 View  Result Date: 11/06/2016 CLINICAL DATA:  Shortness of breath, weakness EXAM: PORTABLE CHEST 1 VIEW COMPARISON:  11/05/2016 FINDINGS: There is no focal parenchymal opacity. There is a trace left pleural effusion. There is mild bilateral interstitial thickening. There is no pneumothorax. The heart and mediastinal contours are unremarkable. Severe osteoarthritis of the left glenohumeral joint. Mild osteoarthritis of the right glenohumeral joint. IMPRESSION: Trace left pleural effusion. Electronically Signed   By: Elige Ko    On: 11/06/2016 11:32   Dg Chest Port 1 View  Result Date: 11/05/2016 CLINICAL DATA:  Increasing dyspnea. EXAM: PORTABLE CHEST 1 VIEW COMPARISON:  November 02, 2016 FINDINGS: Prominent skin fold over the left upper chest. Small bilateral pleural effusions and increased interstitial markings. No change in the cardiomediastinal silhouette. Chronic degenerative changes in the left shoulder. IMPRESSION: 1. Small pleural effusions and increased interstitial markings, suggesting mild edema. However, the patient does not have cardiomegaly. Recommend clinical correlation and follow-up to resolution. Electronically Signed   By: Gerome Sam III M.D   On: 11/05/2016 09:14   Dg Chest Portable 1 View  Result Date: 11/02/2016 CLINICAL DATA:  Shortness of breath and cough EXAM: PORTABLE CHEST 1 VIEW COMPARISON:  10/23/2016 FINDINGS: Mild diffuse interstitial opacities suggestive of chronic change. This is slightly increased at the bilateral lung bases and could reflect superimposed interstitial inflammation. Hazy atelectasis left base with possible tiny effusion. Stable cardiomediastinal silhouette with atherosclerosis. No pneumothorax. IMPRESSION: 1. Slight increased interstitial opacities within the bilateral lung bases could reflect acute interstitial inflammation on chronic change. Mild left CP angle atelectasis with possible tiny effusion. 2. Atherosclerosis of the aorta Electronically Signed   By: Jasmine Pang M.D.   On: 11/02/2016 22:03   Dg Chest Port 1 View  Result Date: 10/23/2016 CLINICAL DATA:  Shortness of Breath EXAM: PORTABLE CHEST 1 VIEW COMPARISON:  10/20/2016 FINDINGS: Cardiac shadow is stable. Aortic calcifications are again seen. Lungs are well aerated bilaterally. Interval clearing in the right base is noted. Some left basilar atelectasis is now seen however. Chronic degenerative change of the left shoulder joint is noted. No other focal abnormality is seen. IMPRESSION: Interval clearing of  right basilar infiltrate. Left basilar atelectasis is now seen however. Electronically Signed   By: Alcide Clever M.D.   On: 10/23/2016 08:18   Dg Chest Port 1 View  Result Date: 10/20/2016 CLINICAL DATA:  Shortness of breath. EXAM: PORTABLE CHEST 1 VIEW COMPARISON:  10/18/2016.  10/10/2016 . FINDINGS: Mediastinum hilar structures normal. Heart size normal. Low lung volumes with basilar atelectasis . Right base infiltrate suggesting pneumonia. No acute bony abnormality identified. IMPRESSION: Right base infiltrate noted  consistent pneumonia. Low lung volumes with mild bibasilar atelectasis. Electronically Signed   By: Maisie Fus  Register   On: 10/20/2016 11:00   Dg Chest Port 1 View  Result Date: 10/18/2016 CLINICAL DATA:  Dyspnea EXAM: PORTABLE CHEST 1 VIEW COMPARISON:  10/10/2016 CXR FINDINGS: The heart size and mediastinal contours are within normal limits. Aortic atherosclerosis is noted. Patchy airspace disease at the right lung base suspicious for pneumonia. Subsegmental can platelike atelectasis at the left lung base. Osteoarthritis of the glenohumeral joints. IMPRESSION: Patchy new airspace opacity at the right lung base suspicious for pneumonia. Left lower lobe atelectasis. Electronically Signed   By: Tollie Eth M.D.   On: 10/18/2016 23:58    ELGERGAWY, DAWOOD M.D on 11/06/2016 at 11:38 AM  Between 7am to 7pm - Pager - [phone]  After 7pm go to [email or web address] - password [facility]  [facility] -  Office  [phone]

## 2016-11-06 NOTE — Progress Notes (Signed)
Dr Seth BakeElgergaway notifed ,patient swallowed tamiflu with apple sauce. Ng order d/c.

## 2016-11-06 NOTE — Progress Notes (Signed)
Enteric precautions and s+tool for cdiff and gi panel d/c per order.No loose syools.

## 2016-11-06 NOTE — Progress Notes (Signed)
Pt remains on BIPAP at this time due to pt condition.

## 2016-11-07 ENCOUNTER — Inpatient Hospital Stay (HOSPITAL_COMMUNITY): Payer: Medicare Other

## 2016-11-07 DIAGNOSIS — Z7189 Other specified counseling: Secondary | ICD-10-CM

## 2016-11-07 DIAGNOSIS — J449 Chronic obstructive pulmonary disease, unspecified: Secondary | ICD-10-CM

## 2016-11-07 DIAGNOSIS — Z515 Encounter for palliative care: Secondary | ICD-10-CM

## 2016-11-07 LAB — TYPE AND SCREEN
ABO/RH(D): O NEG
Antibody Screen: NEGATIVE
UNIT DIVISION: 0
Unit division: 0

## 2016-11-07 LAB — CBC
HCT: 24.6 % — ABNORMAL LOW (ref 36.0–46.0)
HEMOGLOBIN: 8 g/dL — AB (ref 12.0–15.0)
MCH: 30.3 pg (ref 26.0–34.0)
MCHC: 32.5 g/dL (ref 30.0–36.0)
MCV: 93.2 fL (ref 78.0–100.0)
Platelets: 214 10*3/uL (ref 150–400)
RBC: 2.64 MIL/uL — AB (ref 3.87–5.11)
RDW: 18.4 % — ABNORMAL HIGH (ref 11.5–15.5)
WBC: 8.3 10*3/uL (ref 4.0–10.5)

## 2016-11-07 LAB — BASIC METABOLIC PANEL
ANION GAP: 8 (ref 5–15)
BUN: 63 mg/dL — ABNORMAL HIGH (ref 6–20)
CHLORIDE: 116 mmol/L — AB (ref 101–111)
CO2: 15 mmol/L — AB (ref 22–32)
CREATININE: 3.47 mg/dL — AB (ref 0.44–1.00)
Calcium: 8.1 mg/dL — ABNORMAL LOW (ref 8.9–10.3)
GFR calc Af Amer: 13 mL/min — ABNORMAL LOW (ref >60)
GFR calc non Af Amer: 11 mL/min — ABNORMAL LOW (ref >60)
Glucose, Bld: 105 mg/dL — ABNORMAL HIGH (ref 65–99)
POTASSIUM: 5 mmol/L (ref 3.5–5.1)
SODIUM: 139 mmol/L (ref 135–145)

## 2016-11-07 LAB — GLUCOSE, CAPILLARY: GLUCOSE-CAPILLARY: 113 mg/dL — AB (ref 65–99)

## 2016-11-07 LAB — HEPARIN LEVEL (UNFRACTIONATED): Heparin Unfractionated: 0.41 IU/mL (ref 0.30–0.70)

## 2016-11-07 MED ORDER — LORAZEPAM 2 MG/ML IJ SOLN
0.5000 mg | INTRAMUSCULAR | Status: DC | PRN
  Administered 2016-11-08: 0.5 mg via INTRAVENOUS
  Filled 2016-11-07: qty 1

## 2016-11-07 MED ORDER — HYDROMORPHONE HCL 1 MG/ML IJ SOLN
0.5000 mg | Freq: Once | INTRAMUSCULAR | Status: AC
  Administered 2016-11-07: 0.5 mg via INTRAVENOUS
  Filled 2016-11-07: qty 0.5

## 2016-11-07 MED ORDER — SODIUM CHLORIDE 0.9 % IV SOLN
INTRAVENOUS | Status: DC
  Administered 2016-11-07: 08:00:00 via INTRAVENOUS

## 2016-11-07 MED ORDER — LORAZEPAM 2 MG/ML IJ SOLN
1.0000 mg | Freq: Once | INTRAMUSCULAR | Status: AC
  Administered 2016-11-07: 1 mg via INTRAVENOUS
  Filled 2016-11-07: qty 1

## 2016-11-07 NOTE — Progress Notes (Signed)
PROGRESS NOTE                                                                                                                                                                                                             Patient Demographics:    Carrie Pena, is a 83 y.o. female, DOB - 11-02-1927, ZOX:[id number]  Admit date - 11/02/2016   Admitting Physician Briscoe Deutscher, MD  Outpatient Primary MD for the patient is Thayer Headings, MD  LOS - 5   Chief Complaint  Patient presents with  . Hypotension       Brief Narrative   83 y.o. female with medical history significant for COPD with chronic hypoxic respiratory failure, hypertension, peripheral arterial disease, and history of pulmonary embolism on Xarelto, now presenting from her nursing facility for evaluation of fevers, respiratory distress, and hypotension, Workup significant for influenza, Requiring BiPAP initially in ED, after that she is 4-5 L nasal cannula from baseline 3 L, on 1/20 patient with significant respiratory distress, transfered to stepdown and resumed on BiPAP.   Subjective:    [name] today Lethargic, unable to provide any complaints, no significant events overnight .  Assessment  & Plan :    Principal Problem:   Sepsis (HCC) Active Problems:   Essential hypertension, benign   Chronic diastolic CHF (congestive heart failure) (HCC)   AKI (acute kidney injury) (HCC)   COPD (chronic obstructive pulmonary disease) (HCC)   History of pulmonary embolism   Anemia   Decreased hemoglobin   PAD (peripheral artery disease) (HCC)   Chronic respiratory failure with hypoxia (HCC)   Influenza A   Hypokalemia   Hypotension   Influenza   Severe sepsis (HCC)  Acute on chronic respiratory failure - Patient at baseline 3 L nasal cannula - Significant respiratory distress on admission and required BiPAP, weaned off BiPAP, has been on 4-5 L nasal cannula - On 1/20 a.m. with significant respiratory distress,  transferred to stepdown, resumed on BiPAP, remains altered, in respiratory distress, continue with BiPAP, until not alternating with high flow nasal cannula to give wrist from, current respiratory failure secondary to influenza, and COPD exacerbation.  Sepsis, influenza A  - Pt meets sepsis criteria on admission, sxs primarily respiratory  - Blood and urine cultures  with no growth to date - Empiric vancomycin and Zosyn initiated in ED, Stopped vancomycin 1/19 , continue Zosyn - PCR positive for influenza A , continue Tamiflu.  COPD with acute exacerbation - Presents in respiratory distress with increased O2-requirement  - Started on  systemic steroids with IV Solu-Medrol; continue nebs, supplemental O2    Acute kidney injury  - SCr is 1.93 on admission, up from apparent baseline of 0.8 ,18 continue trending up, today is 3.5 - Likely a prerenal azotemia in setting of acute infection with hypotension; ATN possible in this setting  - Avoid nephrotoxins, renally-dose medications,  - Renal ultrasound with no acute findings, check urine sodium, and creatinine  - Holding IV fluid given volume overload, will monitor closely as on IV diuresis .  Hx of pulmonary embolism - No evidence for acute VTE  - Managed with Xarelto, is on hold given adrenal failure, currently  on heparin GTT  Hypokalemia  - Repleted, monitor closely  PAD  -Resume cilostazol when stable  Anemia - Anemia of chronic disease , has been Hemoccult positive repeat his admission, but was unstable for EGD , she was transfused 2 units PRBC event, hemoglobin has been stable since , hemoglobin 7.2, transfused 2 units PRBC, with good response, today is 9 , giving her frail respiratory status, I don't think she will be able to have any GI workup yet, for now we'll continue to monitor and transfuse as needed.  Abnormal urinalysis - No urinary symptoms, negative urine culture, no indication to treat  Code Status : DO NOT  RESUSCITATE/DO NOT INTUBATE  Family Communication  : Discussed with granddaughter at bedside, discussed with her that prognosis appears to be poor , requested palliative medicine consult .  Disposition Plan  : Back to SNF once stable  Consults  : None  Procedures  :  2 units PRBC1/18  DVT Prophylaxis  : Heparin gtt  Lab Results  Component Value Date   PLT 214 11/07/2016    Antibiotics  :    Anti-infectives    Start     Dose/Rate Route Frequency Ordered Stop   11/06/16 1500  oseltamivir (TAMIFLU) capsule 30 mg     30 mg Oral Daily 11/06/16 1151 11/09/16 1459   11/04/16 1800  vancomycin (VANCOCIN) IVPB 1000 mg/200 mL premix  Status:  Discontinued     1,000 mg 200 mL/hr over 60 Minutes Intravenous Every 48 hours 11/02/16 1907 11/04/16 0900   11/04/16 0900  piperacillin-tazobactam (ZOSYN) IVPB 2.25 g     2.25 g 100 mL/hr over 30 Minutes Intravenous Every 6 hours 11/04/16 0714     11/03/16 0100  piperacillin-tazobactam (ZOSYN) IVPB 3.375 g  Status:  Discontinued     3.375 g 12.5 mL/hr over 240 Minutes Intravenous Every 8 hours 11/02/16 1907 11/04/16 0714   11/02/16 2315  oseltamivir (TAMIFLU) capsule 30 mg  Status:  Discontinued     30 mg Oral Daily at bedtime 11/02/16 2251 11/06/16 1151   11/02/16 1830  piperacillin-tazobactam (ZOSYN) IVPB 3.375 g     3.375 g 100 mL/hr over 30 Minutes Intravenous  Once 11/02/16 1817 11/02/16 1904   11/02/16 1830  vancomycin (VANCOCIN) IVPB 1000 mg/200 mL premix     1,000 mg 200 mL/hr over 60 Minutes Intravenous  Once 11/02/16 1817 11/02/16 2148        Objective:   Vitals:   11/07/16 0816 11/07/16 1000 11/07/16 1200 11/07/16 1346  BP:  (!) 149/104 (!) 150/77   Pulse:  98 99   Resp:  14 11   Temp: 97.5 F (36.4 C)  97.6 F (36.4 C)   TempSrc: Oral  Axillary   SpO2: 97% 94% 95% 94%  Weight:      Height:  Wt Readings from Last 3 Encounters:  11/07/16 72 kg (158 lb 11.7 oz)  10/25/16 66.2 kg (146 lb)  10/24/16 66.2 kg (146  lb)     Intake/Output Summary (Last 24 hours) at 11/07/16 1428 Last data filed at 11/07/16 1400  Gross per 24 hour  Intake           619.58 ml  Output              594 ml  Net            25.58 ml     Physical Exam More lethargic, hard to arouse today,Currently on high flow nasal cannula Supple Neck,No JVD Symmetrical Chest wall movement, decreased air movement bilaterally, no wheezing, no crackles,some use of accessory muscle  RRR,No Gallops,Rubs or new Murmurs, No Parasternal Heave +ve B.Sounds, Abd Soft, No tenderness,  No rebound - guarding or rigidity. No Cyanosis, Clubbing , No new Rash or bruise , +1 edema    Data Review:    CBC  Recent Labs Lab 11/02/16 1817 11/03/16 0503 11/04/16 0521 11/05/16 0141 11/06/16 0348 11/07/16 0330  WBC 14.7* 13.3* 11.5* 10.7* 7.8 8.3  HGB 8.0* 7.2* 9.0* 8.2* 8.7* 8.0*  HCT 23.9* 22.4* 27.1* 24.9* 26.4* 24.6*  PLT 235 191 186 202 210 214  MCV 92.6 94.9 90.3 90.2 93.3 93.2  MCH 31.0 30.5 30.0 29.7 30.7 30.3  MCHC 33.5 32.1 33.2 32.9 33.0 32.5  RDW 18.3* 18.6* 17.1* 18.5* 18.7* 18.4*  LYMPHSABS 0.6*  --   --   --   --   --   MONOABS 0.3  --   --   --   --   --   EOSABS 0.0  --   --   --   --   --   BASOSABS 0.0  --   --   --   --   --     Chemistries   Recent Labs Lab 11/02/16 1817 11/03/16 0503 11/04/16 0521 11/05/16 0141 11/06/16 0348 11/07/16 0330  NA 131* 135 136 135 139 139  K 3.1* 3.5 3.2* 4.0 4.7 5.0  CL 103 110 111 113* 115* 116*  CO2 21* 17* 18* 16* 16* 15*  GLUCOSE 103* 195* 144* 123* 100* 105*  BUN 37* 33* 48* 46* 55* 63*  CREATININE 1.93* 1.82* 2.27* 2.68* 2.88* 3.47*  CALCIUM 6.6* 6.2* 7.1* 7.3* 7.6* 8.1*  MG  --  2.2  --   --   --   --   AST 34 26  --   --   --   --   ALT 16 15  --   --   --   --   ALKPHOS 51 46  --   --   --   --   BILITOT 0.7 0.4  --   --   --   --    ------------------------------------------------------------------------------------------------------------------ No results  for input(s): CHOL, HDL, LDLCALC, TRIG, CHOLHDL, LDLDIRECT in the last 72 hours.  No results found for: HGBA1C ------------------------------------------------------------------------------------------------------------------ No results for input(s): TSH, T4TOTAL, T3FREE, THYROIDAB in the last 72 hours.  Invalid input(s): FREET3 ------------------------------------------------------------------------------------------------------------------ No results for input(s): VITAMINB12, FOLATE, FERRITIN, TIBC, IRON, RETICCTPCT in the last 72 hours.  Coagulation profile  Recent Labs Lab 11/03/16 0036  INR 1.97    No results for input(s): DDIMER in the last 72 hours.  Cardiac Enzymes No results for input(s): CKMB, TROPONINI, MYOGLOBIN in the last 168 hours.  Invalid input(s): CK ------------------------------------------------------------------------------------------------------------------  Component Value Date/Time   BNP 54.1 10/21/2016 0553    Inpatient Medications  Scheduled Meds: . atorvastatin  20 mg Oral q1800  . chlorhexidine  15 mL Mouth Rinse BID  . feeding supplement (PRO-STAT SUGAR FREE 64)  30 mL Oral BID  . ipratropium-albuterol  3 mL Nebulization QID  . lidocaine  1 patch Transdermal Q24H  . mouth rinse  15 mL Mouth Rinse q12n4p  . methylPREDNISolone (SOLU-MEDROL) injection  80 mg Intravenous Q6H  . mometasone-formoterol  2 puff Inhalation BID  . oseltamivir  30 mg Oral Q1500  . pantoprazole (PROTONIX) IV  40 mg Intravenous Q12H  . piperacillin-tazobactam (ZOSYN)  IV  2.25 g Intravenous Q6H  . polyethylene glycol  17 g Oral BID  . senna-docusate  2 tablet Oral BID  . sodium chloride flush  3 mL Intravenous Q12H   Continuous Infusions: . sodium chloride 50 mL/hr at 11/07/16 0756  . heparin 750 Units/hr (11/06/16 2020)   PRN Meds:.ALPRAZolam, HYDROmorphone (DILAUDID) injection, ipratropium-albuterol, menthol-cetylpyridinium, ondansetron **OR** ondansetron  (ZOFRAN) IV, oxyCODONE  Micro Results Recent Results (from the past 240 hour(s))  Blood Culture (routine x 2)     Status: None (Preliminary result)   Collection Time: 11/02/16  6:41 PM  Result Value Ref Range Status   Specimen Description BLOOD BLOOD RIGHT WRIST  Final   Special Requests BOTTLES DRAWN AEROBIC AND ANAEROBIC 10CC  Final   Culture   Final    NO GROWTH 3 DAYS Performed at [facility], [address] [place], [place] [address]    Report Status PENDING  Incomplete  Blood Culture (routine x 2)     Status: None (Preliminary result)   Collection Time: 11/02/16  6:41 PM  Result Value Ref Range Status   Specimen Description BLOOD BLOOD RIGHT WRIST  Final   Special Requests BOTTLES DRAWN AEROBIC AND ANAEROBIC 10CC  Final   Culture   Final    NO GROWTH 3 DAYS Performed at [facility], [address] [place], [place] [address]    Report Status PENDING  Incomplete  Urine culture     Status: None   Collection Time: 11/02/16 11:16 PM  Result Value Ref Range Status   Specimen Description URINE, CATHETERIZED  Final   Special Requests NONE  Final   Culture   Final    NO GROWTH Performed at [facility], [address] [place], [place] [address]    Report Status 11/04/2016 FINAL  Final  MRSA PCR Screening     Status: None   Collection Time: 11/03/16  7:17 PM  Result Value Ref Range Status   MRSA by PCR NEGATIVE NEGATIVE Final    Comment:        The GeneXpert MRSA Assay (FDA approved for NASAL specimens only), is one component of a comprehensive MRSA colonization surveillance program. It is not intended to diagnose MRSA infection nor to guide or monitor treatment for MRSA infections.   MRSA PCR Screening     Status: None   Collection Time: 11/05/16  9:35 AM  Result Value Ref Range Status   MRSA by PCR NEGATIVE NEGATIVE Final    Comment:        The GeneXpert MRSA Assay (FDA approved for NASAL specimens only), is one component of  a comprehensive MRSA colonization surveillance program. It is not intended to diagnose MRSA infection nor to guide or monitor treatment for MRSA infections.     Radiology Reports Dg Chest 2  View  Result Date: 10/11/2016 CLINICAL DATA:  Cough and shortness of breath tonight. Recent diagnosis of pulmonary embolus. EXAM: CHEST  2 VIEW COMPARISON:  Radiograph 03/02/2016, CT 10/03/2016 FINDINGS: Lower lung volumes from prior exam. Mild emphysema. Tortuous thoracic aorta with atherosclerosis. The heart is normal in size. No pulmonary edema. Minimal left infrahilar atelectasis. No confluent airspace disease, pleural effusion or pneumothorax. Advanced degenerative change of the left shoulder. IMPRESSION: Left lung base atelectasis. Thoracic aortic atherosclerosis. Electronically Signed   By: Rubye OaksMelanie  Ehinger M.D.   On: 10/11/2016 00:08   [place] Renal  Result Date: 11/05/2016 CLINICAL DATA:  Renal failure EXAM: RENAL / URINARY TRACT ULTRASOUND COMPLETE COMPARISON:  CT scan September 30, 2016 FINDINGS: Right Kidney: Length: 10.1 cm. Nonshadowing echogenic foci may represent vascular calcifications seen on recent CT imaging. No hydronephrosis or acute abnormality. Left Kidney: Length: 10.5 cm. Bilateral echogenic nonshadowing foci may represent vascular calcifications as above. No hydronephrosis or acute abnormality. Bladder: Bladder not visualized at this time. IMPRESSION: 1. No cause for renal failure identified. The bladder is decompressed and not evaluated. 2. Small left pleural effusion. Electronically Signed   By: Gerome Samavid  Williams III M.D   On: 11/05/2016 11:07   Dg Chest Port 1 View  Result Date: 11/06/2016 CLINICAL DATA:  Shortness of breath, weakness EXAM: PORTABLE CHEST 1 VIEW COMPARISON:  11/05/2016 FINDINGS: There is no focal parenchymal opacity. There is a trace left pleural effusion. There is mild bilateral interstitial thickening. There is no pneumothorax. The heart and mediastinal contours are  unremarkable. Severe osteoarthritis of the left glenohumeral joint. Mild osteoarthritis of the right glenohumeral joint. IMPRESSION: Trace left pleural effusion. Electronically Signed   By: Elige KoHetal  Patel   On: 11/06/2016 11:32   Dg Chest Port 1 View  Result Date: 11/05/2016 CLINICAL DATA:  Increasing dyspnea. EXAM: PORTABLE CHEST 1 VIEW COMPARISON:  November 02, 2016 FINDINGS: Prominent skin fold over the left upper chest. Small bilateral pleural effusions and increased interstitial markings. No change in the cardiomediastinal silhouette. Chronic degenerative changes in the left shoulder. IMPRESSION: 1. Small pleural effusions and increased interstitial markings, suggesting mild edema. However, the patient does not have cardiomegaly. Recommend clinical correlation and follow-up to resolution. Electronically Signed   By: Gerome Samavid  Williams III M.D   On: 11/05/2016 09:14   Dg Chest Portable 1 View  Result Date: 11/02/2016 CLINICAL DATA:  Shortness of breath and cough EXAM: PORTABLE CHEST 1 VIEW COMPARISON:  10/23/2016 FINDINGS: Mild diffuse interstitial opacities suggestive of chronic change. This is slightly increased at the bilateral lung bases and could reflect superimposed interstitial inflammation. Hazy atelectasis left base with possible tiny effusion. Stable cardiomediastinal silhouette with atherosclerosis. No pneumothorax. IMPRESSION: 1. Slight increased interstitial opacities within the bilateral lung bases could reflect acute interstitial inflammation on chronic change. Mild left CP angle atelectasis with possible tiny effusion. 2. Atherosclerosis of the aorta Electronically Signed   By: Jasmine PangKim  Fujinaga M.D.   On: 11/02/2016 22:03   Dg Chest Port 1 View  Result Date: 10/23/2016 CLINICAL DATA:  Shortness of Breath EXAM: PORTABLE CHEST 1 VIEW COMPARISON:  10/20/2016 FINDINGS: Cardiac shadow is stable. Aortic calcifications are again seen. Lungs are well aerated bilaterally. Interval clearing in the right  base is noted. Some left basilar atelectasis is now seen however. Chronic degenerative change of the left shoulder joint is noted. No other focal abnormality is seen. IMPRESSION: Interval clearing of right basilar infiltrate. Left basilar atelectasis is now seen however. Electronically Signed   By: Loraine LericheMark  Lukens M.D.   On: 10/23/2016 08:18   Dg Chest Port 1 View  Result Date: 10/20/2016 CLINICAL DATA:  Shortness of breath. EXAM: PORTABLE CHEST 1 VIEW COMPARISON:  10/18/2016.  10/10/2016 . FINDINGS: Mediastinum hilar structures normal. Heart size normal. Low lung volumes with basilar atelectasis . Right base infiltrate suggesting pneumonia. No acute bony abnormality identified. IMPRESSION: Right base infiltrate noted consistent pneumonia. Low lung volumes with mild bibasilar atelectasis. Electronically Signed   By: Maisie Fus  Register   On: 10/20/2016 11:00   Dg Chest Port 1 View  Result Date: 10/18/2016 CLINICAL DATA:  Dyspnea EXAM: PORTABLE CHEST 1 VIEW COMPARISON:  10/10/2016 CXR FINDINGS: The heart size and mediastinal contours are within normal limits. Aortic atherosclerosis is noted. Patchy airspace disease at the right lung base suspicious for pneumonia. Subsegmental can platelike atelectasis at the left lung base. Osteoarthritis of the glenohumeral joints. IMPRESSION: Patchy new airspace opacity at the right lung base suspicious for pneumonia. Left lower lobe atelectasis. Electronically Signed   By: Tollie Eth M.D.   On: 10/18/2016 23:58    Deiondre Harrower M.D on 11/07/2016 at 2:28 PM  Between 7am to 7pm - Pager - [phone]  After 7pm go to [email or web address] - password [facility]  [facility] -  Office  [phone]

## 2016-11-07 NOTE — Clinical Social Work Note (Signed)
Clinical Social Work Assessment  Patient Details  Name: Carrie Pena MRN: [id number] Date of Birth: [birth year]  Date of referral:  11/07/16               Reason for consult:  Facility Placement, Discharge Planning                Permission sought to share information with:    Permission granted to share information::     Name::        Agency::     Relationship::     Contact Information:     Housing/Transportation Living arrangements for the past 2 months:  [place] of Information:  Other (Comment Required) Loss adjuster, chartered) Patient Interpreter Needed:  None Criminal Activity/Legal Involvement Pertinent to Current Situation/Hospitalization:  No - Comment as needed Significant Relationships:  Adult Children, Other Family Members Lives with:  Facility Resident Do you feel safe going back to the place where you live?  No Need for family participation in patient care:  Yes (Comment)  Care giving concerns:  Granddaughter is not pleased with [facility] placement and will not have pt return.   Social Worker assessment / plan:  Pt hospitalized from [facility] on 11/02/16 with sepsis, influenza A. PN reviewed. Pt sleeping soundly when CSW came to assess. CSW met with pt's granddaughter at bedside. Granddaughter reports that Palliative Care Team meeting is pending. Granddaughter reports that pt will not be returning to SNF. Disposition to be determined. CSW will remain available to assist with d/c planning needs.  Employment status:  Retired Forensic scientist:  Medicare PT Recommendations:  Not assessed at this time Information / Referral to community resources:     Patient/Family's Response to care:  Disposition to be determined.  Patient/Family's Understanding of and Emotional Response to Diagnosis, Current Treatment, and Prognosis:  Palliative Care Team consult pending. Granddaughter / daughter are aware that pt's prognosis is poor. Granddaughter was  tearful while talking about pt. Granddaughter appreciated support provided by CSW.  Emotional Assessment Appearance:  Appears stated age Attitude/Demeanor/Rapport:  Unable to Assess Affect (typically observed):  Unable to Assess Orientation:  Oriented to Self Alcohol / Substance use:  Not Applicable Psych involvement (Current and /or in the community):  No (Comment)  Discharge Needs  Concerns to be addressed:  Discharge Planning Concerns Readmission within the last 30 days:  Yes Current discharge risk:  None Barriers to Discharge:  No Barriers Identified   Luretha Rued, [place] 11/07/2016, 1:11 PM

## 2016-11-07 NOTE — Progress Notes (Signed)
NUTRITION NOTE  RD pulled to pt for low Braden. Pt discussed in rounds this AM. Per that discussion, pt likely to become comfort measures and possibly anticipate hospital death. She was previously on Heart Healthy diet with no documented intakes and was changed to NPO yesterday at 0825. Will continue to monitor GOC/POC while hospitalized and monitor if nutrition-related needs should arise. Did not see pt or speak with family today.    Trenton GammonJessica Bartow Zylstra, MS, RD, LDN, [facility] Inpatient Clinical Dietitian Pager # [phone] After hours/weekend pager # [phone]

## 2016-11-07 NOTE — Progress Notes (Signed)
Enteric precautions discontinued per Dr. Randol KernElgergawy due to no stools in last 48 hours.

## 2016-11-07 NOTE — Progress Notes (Signed)
NG tube pushed in 11cm per radiology verbal order.

## 2016-11-07 NOTE — Consult Note (Signed)
Consultation Note Date: 11/07/2016   Patient Name: Carrie Pena  DOB: 08/21/1928  MRN: [id number]  Age / Sex: 83 y.o., female  PCP: Thayer Headings, MD Referring Physician: Starleen Arms, MD  Reason for Consultation: Establishing goals of care  HPI/Patient Profile: 83 y.o. female  with past medical history of COPD with chronic hypoxic respiratory failure, dCHF, HTN, PAD, PE on Xarelto who presented to the ED from her nursing facility with fever, hypotension, and respiratory distress who was admitted on 11/02/2016 with work-up revealing influenza. She is on 3L O2 at baseline, and required escalation to BiPAP on admission. There was some improvement, however on 1/20 she acutely worsened and was transferred to stepdown. Suspect flu with triggered COPD exacerbation. Unfortunately, she continues to decline despite interventions. She is increasingly confused, continues to have respiratory distress requiring HFNC, and now has progressive AKI (suspected due to prerenal azotemia in the setting of infection and hypotension). Palliative was consulted to facilitate goals of care conversations with her family in the setting of continued decline despite aggressive interventions.   Clinical Assessment and Goals of Care: Carrie Pena is unable to meaningfully participate in goals of care conversations due to altered mental status and verbal unresponsiveness. Her daughter and granddaughter were at her bedside. They are her HCPOA (granddaughter is first, daughter is second), which I confirmed with review of her legal documents they had available.   We talked at length about Carrie Pena health leading up to this hospitalization, including her multiple admissions and their feeling that her health was slowly worsening over the past few months. We then talked about this admission, and I explained her health issues, which include flu with sepsis, COPD, exacerbation, confusion,  and AKI.  In terms of their expectations for the future, they both remain very hopeful that she will have a meaningful recovery. They understand she is very sick, and they understand her issues have not improved over the past 48 hours, but they still hold on to the hope that a little more time is all she needs to improve. I did review two different care paths: aggressive interventions vs comfort care, and explained the value and rationale for each. At this juncture, they are still focused on pursuing interventions up to DNR status, and wish to re-assess daily. I suspect if she were to acutely decline/decline further, they would be supportive of comfort care. They are not at that place yet, however.   Primary Decision Maker HCPOA   SUMMARY OF RECOMMENDATIONS    DNR, otherwise continue to treat what is treatable with full scope interventions  They are okay with NG tube for Tamiflu and nutrition (short term only, no plan for feeding tube)  Code Status/Advance Care Planning:  DNR   Symptom Management:  Pt will wake for brief periods and cry out; unclear if this is pain or anxiety.   Continue PRN dilaudid, will add PRN ativan. Plan to d/c ativan if NG tube is placed as can then use home Xanax.   Palliative Prophylaxis:   Aspiration, Bowel Regimen, Frequent Pain Assessment, Oral Care and Turn Reposition  Additional Recommendations (Limitations, Scope, Preferences):  Full Scope Treatment  Psycho-social/Spiritual:   Desire for further Chaplaincy support:no  Additional Recommendations: TBD  Prognosis:   Unable to determine  Discharge Planning: To Be Determined      Primary Diagnoses: Present on Admission: . AKI (acute kidney injury) (HCC) . COPD (chronic obstructive pulmonary disease) (HCC) . Chronic diastolic CHF (congestive heart failure) (HCC) .  Anemia . Decreased hemoglobin . History of pulmonary embolism . Essential hypertension, benign . Chronic respiratory failure  with hypoxia (HCC) . Sepsis (HCC) . Influenza A . Hypokalemia . Hypotension . PAD (peripheral artery disease) (HCC) . Influenza . Severe sepsis (HCC)   I have reviewed the medical record, interviewed the patient and family, and examined the patient. The following aspects are pertinent.  Past Medical History:  Diagnosis Date  . Carpal tunnel syndrome   . COPD (chronic obstructive pulmonary disease) (HCC)   . GERD (gastroesophageal reflux disease)   . Hyperlipidemia   . Hypertension   . Osteoarthritis    Social History   Social History  . Marital status: Widowed    Spouse name: N/A  . Number of children: N/A  . Years of education: N/A   Social History Main Topics  . Smoking status: Former Smoker    Packs/day: 0.50    Types: Cigarettes    Quit date: 03/12/2015  . Smokeless tobacco: Never Used  . Alcohol use No  . Drug use: No  . Sexual activity: Not Asked   Other Topics Concern  . None   Social History Narrative  . None   Family History  Problem Relation Age of Onset  . Cancer Mother   . Cancer Father   . Hypertension Daughter   . Heart disease Daughter     before age 81   Scheduled Meds: . atorvastatin  20 mg Oral q1800  . chlorhexidine  15 mL Mouth Rinse BID  . feeding supplement (PRO-STAT SUGAR FREE 64)  30 mL Oral BID  . ipratropium-albuterol  3 mL Nebulization QID  . lidocaine  1 patch Transdermal Q24H  . mouth rinse  15 mL Mouth Rinse q12n4p  . methylPREDNISolone (SOLU-MEDROL) injection  80 mg Intravenous Q6H  . mometasone-formoterol  2 puff Inhalation BID  . oseltamivir  30 mg Oral Q1500  . pantoprazole (PROTONIX) IV  40 mg Intravenous Q12H  . piperacillin-tazobactam (ZOSYN)  IV  2.25 g Intravenous Q6H  . polyethylene glycol  17 g Oral BID  . senna-docusate  2 tablet Oral BID  . sodium chloride flush  3 mL Intravenous Q12H   Continuous Infusions: . sodium chloride 50 mL/hr at 11/07/16 0756  . heparin 750 Units/hr (11/06/16 2020)   PRN  Meds:.ALPRAZolam, HYDROmorphone (DILAUDID) injection, ipratropium-albuterol, menthol-cetylpyridinium, ondansetron **OR** ondansetron (ZOFRAN) IV, oxyCODONE Allergies  Allergen Reactions  . Tylenol [Acetaminophen] Other (See Comments)    Reaction:  Shaking    Review of Systems: Unable to obtain due to AMS.  Physical Exam  Constitutional: She has a sickly appearance. Nasal cannula in place.  Frail woman unresponsive in bed.   Eyes:  Eyes predominantly closed, when open she has a fixed stare  Neck: Normal range of motion.  Cardiovascular: Tachycardia present.   Pulmonary/Chest:  Periods of acute tachypnea with marked increased work of breathing, then reverts to normal rate with mild increased work of breathing. Audible wheezing.   Abdominal: Soft.  Musculoskeletal: She exhibits edema.  Neurological:  Opens eyes to physical stimuli, but fixed stare. Does not follow any commands. Inconsistent head nodding/shaking to questions.   Skin: Skin is warm and dry. There is pallor.  Psychiatric:  Short bursts of acute anxiety with load calling out and grimacing.    Vital Signs: BP (!) 150/77 (BP Location: Right Arm)   Pulse 99   Temp 97.5 F (36.4 C) (Oral)   Resp 11   Ht 5\' 2"  (1.575 m)  Wt 72 kg (158 lb 11.7 oz)   SpO2 95%   BMI 29.03 kg/m  Pain Assessment: CPOT POSS *See Group Information*: S-Acceptable,Sleep, easy to arouse Pain Score: 1    SpO2: SpO2: 95 % O2 Device:SpO2: 95 % O2 Flow Rate: .O2 Flow Rate (L/min): 5 L/min  IO: Intake/output summary:  Intake/Output Summary (Last 24 hours) at 11/07/16 1327 Last data filed at 11/07/16 1200  Gross per 24 hour  Intake           519.58 ml  Output              680 ml  Net          -160.42 ml    LBM: Last BM Date: 11/05/16 Baseline Weight: Weight: 66.2 kg (146 lb) Most recent weight: Weight: 72 kg (158 lb 11.7 oz)     Palliative Assessment/Data: PPS 10%   Flowsheet Rows   Flowsheet Row Most Recent Value  Intake Tab    Referral Department  Hospitalist  Unit at Time of Referral  ICU  Palliative Care Primary Diagnosis  Pulmonary  Date Notified  11/06/16  Palliative Care Type  New Palliative care  Reason for referral  Clarify Goals of Care  Date of Admission  11/02/16  # of days IP prior to Palliative referral  4  Clinical Assessment  Psychosocial & Spiritual Assessment  Palliative Care Outcomes      Time Total: 70 minutes Greater than 50%  of this time was spent counseling and coordinating care related to the above assessment and plan.  Signed by: Murrell Converse, NP Palliative Medicine Team Pager # [phone] (M-F 7a-5p) Team Phone # [phone] (Nights/Weekends)

## 2016-11-07 NOTE — Progress Notes (Signed)
ANTICOAGULATION CONSULT NOTE - Follow-Up Consult  Pharmacy Consult for Heparin Indication: Recent pulmonary embolus, now off Xarelto (AKI)  Allergies  Allergen Reactions  . Tylenol [Acetaminophen] Other (See Comments)    Reaction:  Shaking     Patient Measurements: Height: 5\' 2"  (157.5 cm) Weight: 158 lb 11.7 oz (72 kg) IBW/kg (Calculated) : 50.1   Vital Signs: Temp: 97.5 F (36.4 C) (01/22 0319) Temp Source: Oral (01/22 0319) BP: 167/75 (01/22 0605) Pulse Rate: 101 (01/22 0605)  Labs:  Recent Labs  11/05/16 0141 11/06/16 0348 11/07/16 0330  HGB 8.2* 8.7* 8.0*  HCT 24.9* 26.4* 24.6*  PLT 202 210 214  HEPARINUNFRC 0.39 0.30 0.41  CREATININE 2.68* 2.88* 3.47*    Estimated Creatinine Clearance: 10.8 mL/min (by C-G formula based on SCr of 3.47 mg/dL (H)).   Medical History: Past Medical History:  Diagnosis Date  . Carpal tunnel syndrome   . COPD (chronic obstructive pulmonary disease) (HCC)   . GERD (gastroesophageal reflux disease)   . Hyperlipidemia   . Hypertension   . Osteoarthritis     Assessment: 83 y/o F on Xarelto for pulmonary embolism diagnosed December 2017, admitted the night of 11/02/16 with sepsis, influenza A, acute on chronic respiratory failure (COPD), and AKI.  Date / time of last Xarelto dose are not recorded on med history, but on admission elevation in INR and aPTT were noted, which is consistent with recent Xarelto use.  Because estimated CrCl less than 30 mL/min, Xarelto was held on admission.  Orders subsequently received to begin heparin infusion with pharmacy dosing assistance. Originally using APTT to adjust heparin infusion rate; however, now that effects of Xarelto believed to have dissipated, using heparin levels for adjustments moving forward.  Today, 11/07/16:  Heparin level this am = 0.41 units/mL, therapeutic on heparin infusion at 750 units/hr.  Level noted to be trending down to low end goal  CBC: Hgb low/stable, Pltc  WNL  SCr worsening - renal U/S unrevealing  No obvious bleeding  Goal of Therapy:  Heparin level 0.3-0.7 units/ml Monitor platelets by anticoagulation protocol: Yes   Plan:   Continue heparin infusion at 750 units/hr.  Daily CBC and heparin level while on heparin infusion.  Monitor for s/sx of bleeding.  Follow clinical course.  If renal function does not improve, then will need to consider alternative anticoagulant  Clance BollAmanda Sherill Mangen, PharmD, BCPS Pager: [phone] 11/07/2016 7:05 AM

## 2016-11-07 NOTE — Progress Notes (Signed)
Pharmacy Antibiotic Note  Carrie Pena is a 83 y.o. female recently discharged [facility] on [facility], [facility]2016 with c/o fever and hypotension. She tested +Influenza A and has been started on Tamiflu.  She continues on day#6 antibiotics due to high risk of PNA, co-infection.  Pharmacy consulted to dose Zosyn.  11/07/2016:  Afebrile  WBC normalized  AKI: Scr trending up, est CrCl ~ 2310ml/min.   Plan: Continue Zosyn 2.25gm IV q6h infused over 30 minutes for CrCl<20 ml/min. Follow up duration of antibiotic treatment. __________________________  Height: 5\' 2"  (157.5 cm) Weight: 158 lb 11.7 oz (72 kg) IBW/kg (Calculated) : 50.1  Temp (24hrs), Avg:97.5 F (36.4 C), Min:97.4 F (36.3 C), Max:97.5 F (36.4 C)   Recent Labs Lab 11/02/16 1823 11/03/16 0030 11/03/16 0503 11/04/16 0521 11/05/16 0141 11/06/16 0348 11/07/16 0330  WBC  --   --  13.3* 11.5* 10.7* 7.8 8.3  CREATININE  --   --  1.82* 2.27* 2.68* 2.88* 3.47*  LATICACIDVEN 1.52 2.2*  --   --   --   --   --     Estimated Creatinine Clearance: 10.8 mL/min (by C-G formula based on SCr of 3.47 mg/dL (H)).    Allergies  Allergen Reactions  . Tylenol [Acetaminophen] Other (See Comments)    Reaction:  Shaking    Antimicrobials this admission:  1/17 vanc >> 1/19 1/17 zosyn>>  1/17 Tamiflu >> (1/21)  Dose adjustments this admission:  1/19: Zosyn decreased to 2.25gm IV q6h infused over 30 min for CrCl<20  Microbiology results:  1/17 Urine: NG 1/17 Blood: NGTD 1/17 Infuenza A PCR: POSITIVE 1/18 MRSA PCR: negative  Thank you for allowing pharmacy to be a part of this patient's care.  Clance BollAmanda Mckinzi Eriksen, PharmD, BCPS Pager: [phone] 11/07/2016 7:56 AM

## 2016-11-08 DIAGNOSIS — N19 Unspecified kidney failure: Secondary | ICD-10-CM

## 2016-11-08 LAB — CULTURE, BLOOD (ROUTINE X 2)
Culture: NO GROWTH
Culture: NO GROWTH

## 2016-11-08 LAB — CBC
HCT: 23.1 % — ABNORMAL LOW (ref 36.0–46.0)
HEMATOCRIT: 21 % — AB (ref 36.0–46.0)
HEMOGLOBIN: 7.7 g/dL — AB (ref 12.0–15.0)
Hemoglobin: 6.8 g/dL — CL (ref 12.0–15.0)
MCH: 29.8 pg (ref 26.0–34.0)
MCH: 30.4 pg (ref 26.0–34.0)
MCHC: 32.4 g/dL (ref 30.0–36.0)
MCHC: 33.3 g/dL (ref 30.0–36.0)
MCV: 91.3 fL (ref 78.0–100.0)
MCV: 92.1 fL (ref 78.0–100.0)
Platelets: 192 10*3/uL (ref 150–400)
Platelets: 266 10*3/uL (ref 150–400)
RBC: 2.28 MIL/uL — AB (ref 3.87–5.11)
RBC: 2.53 MIL/uL — AB (ref 3.87–5.11)
RDW: 18.2 % — ABNORMAL HIGH (ref 11.5–15.5)
RDW: 18.3 % — ABNORMAL HIGH (ref 11.5–15.5)
WBC: 12.6 10*3/uL — ABNORMAL HIGH (ref 4.0–10.5)
WBC: 7.7 10*3/uL (ref 4.0–10.5)

## 2016-11-08 LAB — HEPARIN LEVEL (UNFRACTIONATED): Heparin Unfractionated: 0.53 IU/mL (ref 0.30–0.70)

## 2016-11-08 LAB — HEMOGLOBIN AND HEMATOCRIT, BLOOD
HEMATOCRIT: 27.7 % — AB (ref 36.0–46.0)
Hemoglobin: 9.3 g/dL — ABNORMAL LOW (ref 12.0–15.0)

## 2016-11-08 LAB — BASIC METABOLIC PANEL
ANION GAP: 10 (ref 5–15)
BUN: 81 mg/dL — ABNORMAL HIGH (ref 6–20)
CHLORIDE: 117 mmol/L — AB (ref 101–111)
CO2: 15 mmol/L — AB (ref 22–32)
Calcium: 8.3 mg/dL — ABNORMAL LOW (ref 8.9–10.3)
Creatinine, Ser: 3.63 mg/dL — ABNORMAL HIGH (ref 0.44–1.00)
GFR calc Af Amer: 12 mL/min — ABNORMAL LOW (ref >60)
GFR calc non Af Amer: 10 mL/min — ABNORMAL LOW (ref >60)
GLUCOSE: 125 mg/dL — AB (ref 65–99)
POTASSIUM: 4.8 mmol/L (ref 3.5–5.1)
Sodium: 142 mmol/L (ref 135–145)

## 2016-11-08 LAB — PREPARE RBC (CROSSMATCH)

## 2016-11-08 LAB — GLUCOSE, CAPILLARY: Glucose-Capillary: 132 mg/dL — ABNORMAL HIGH (ref 65–99)

## 2016-11-08 MED ORDER — POLYVINYL ALCOHOL 1.4 % OP SOLN
1.0000 [drp] | Freq: Four times a day (QID) | OPHTHALMIC | Status: DC | PRN
  Filled 2016-11-08: qty 15

## 2016-11-08 MED ORDER — PANTOPRAZOLE SODIUM 40 MG IV SOLR: 40.0000 mg | Freq: Two times a day (BID) | INTRAVENOUS | Status: DC

## 2016-11-08 MED ORDER — ONDANSETRON HCL 4 MG/2ML IJ SOLN: 4.0000 mg | Freq: Four times a day (QID) | INTRAMUSCULAR | Status: DC | PRN

## 2016-11-08 MED ORDER — METHYLPREDNISOLONE SODIUM SUCC 125 MG IJ SOLR
60.0000 mg | Freq: Four times a day (QID) | INTRAMUSCULAR | Status: DC
  Administered 2016-11-08 – 2016-11-09 (×3): 60 mg via INTRAVENOUS
  Filled 2016-11-08 (×3): qty 2

## 2016-11-08 MED ORDER — HYDROMORPHONE HCL 1 MG/ML IJ SOLN
0.5000 mg | INTRAMUSCULAR | Status: DC | PRN
  Administered 2016-11-08 (×2): 0.5 mg via INTRAVENOUS
  Filled 2016-11-08 (×2): qty 0.5

## 2016-11-08 MED ORDER — SODIUM CHLORIDE 0.9 % IV SOLN
0.5000 mg/h | INTRAVENOUS | Status: DC
  Administered 2016-11-08 – 2016-11-11 (×3): 0.5 mg/h via INTRAVENOUS
  Filled 2016-11-08 (×3): qty 5

## 2016-11-08 MED ORDER — HALOPERIDOL LACTATE 5 MG/ML IJ SOLN
0.5000 mg | INTRAMUSCULAR | Status: DC | PRN
  Administered 2016-11-09: 0.5 mg via INTRAVENOUS
  Filled 2016-11-08: qty 1

## 2016-11-08 MED ORDER — SODIUM CHLORIDE 0.9 % IV SOLN
Freq: Once | INTRAVENOUS | Status: AC
  Administered 2016-11-08: 08:00:00 via INTRAVENOUS

## 2016-11-08 MED ORDER — FUROSEMIDE 10 MG/ML IJ SOLN
20.0000 mg | Freq: Once | INTRAMUSCULAR | Status: AC
  Administered 2016-11-08: 20 mg via INTRAVENOUS
  Filled 2016-11-08: qty 2

## 2016-11-08 MED ORDER — HYDROMORPHONE BOLUS VIA INFUSION
0.5000 mg | INTRAVENOUS | Status: DC | PRN
  Administered 2016-11-08 – 2016-11-13 (×20): 0.5 mg via INTRAVENOUS
  Filled 2016-11-08: qty 1

## 2016-11-08 MED ORDER — IPRATROPIUM-ALBUTEROL 0.5-2.5 (3) MG/3ML IN SOLN
3.0000 mL | Freq: Three times a day (TID) | RESPIRATORY_TRACT | Status: DC
  Filled 2016-11-08: qty 3

## 2016-11-08 MED ORDER — BIOTENE DRY MOUTH MT LIQD: 15.0000 mL | OROMUCOSAL | Status: DC | PRN

## 2016-11-08 MED ORDER — GLYCOPYRROLATE 0.2 MG/ML IJ SOLN
0.2000 mg | INTRAMUSCULAR | Status: DC | PRN
  Filled 2016-11-08: qty 1

## 2016-11-08 MED ORDER — SODIUM CHLORIDE 0.9 % IV SOLN: Freq: Once | INTRAVENOUS | Status: DC

## 2016-11-08 MED ORDER — SODIUM CHLORIDE 0.9 % IV SOLN
8.0000 mg/h | INTRAVENOUS | Status: DC
  Administered 2016-11-08 (×2): 8 mg/h via INTRAVENOUS
  Filled 2016-11-08 (×6): qty 80

## 2016-11-08 MED ORDER — SENNOSIDES 8.8 MG/5ML PO SYRP
5.0000 mL | ORAL_SOLUTION | Freq: Two times a day (BID) | ORAL | Status: DC
  Filled 2016-11-08: qty 5

## 2016-11-08 MED ORDER — SODIUM CHLORIDE 0.9 % IV SOLN
80.0000 mg | Freq: Once | INTRAVENOUS | Status: AC
  Administered 2016-11-08: 09:00:00 80 mg via INTRAVENOUS
  Filled 2016-11-08: qty 80

## 2016-11-08 NOTE — Consult Note (Signed)
   [facility]   11/08/2016  [name] 1928/05/21 [id number]   Ms. Carrie Pena [facility] prior to admission. She has been [facility] L[facility]. Chart reviewed. Noted Ms. Carrie Pena is now full comfort care. Will update [facility] LCSW.  Raiford NobleAtika Motty Borin, MSN-Ed, RN,BSN [facility] Hospital Liaison [phone]

## 2016-11-08 NOTE — Progress Notes (Signed)
Notified MD of pt increased work of breathing and coughing up bright red blood. Heparin was stopped. New orders given. VSS. Will continue to monitor.

## 2016-11-08 NOTE — Progress Notes (Signed)
Notified family of new onset of bleeding. Will continue to monitor.

## 2016-11-08 NOTE — Progress Notes (Signed)
Daily Progress Note   Patient Name: Carrie Pena       Date: 11/08/2016 DOB: 09/07/1928  Age: 83 y.o. MRN#: [id number] Attending Physician: Albertine Patricia, MD Primary Care Physician: Thressa Sheller, MD Admit Date: 11/02/2016  Reason for Consultation/Follow-up: Establishing goals of care and Psychosocial/spiritual support  Subjective: Carrie Pena breathing has slightly improved with a normal rate and less work of breathing, however there is otherwise little improvement. She remains unresponsive except to painful stimuli. She suffered a GI bleed post NG tube placement early this morning , with subsequent drop in her Hgb/Hct requiring transfusion. She has also become increasingly edematous with worsening of her renal status (creatinine now 3.63).   Length of Stay: 6  Current Medications: Scheduled Meds:  . sodium chloride   Intravenous Once  . chlorhexidine  15 mL Mouth Rinse BID  . feeding supplement (PRO-STAT SUGAR FREE 64)  30 mL Oral BID  . ipratropium-albuterol  3 mL Nebulization QID  . lidocaine  1 patch Transdermal Q24H  . mouth rinse  15 mL Mouth Rinse q12n4p  . methylPREDNISolone (SOLU-MEDROL) injection  80 mg Intravenous Q6H  . mometasone-formoterol  2 puff Inhalation BID  . oseltamivir  30 mg Oral Q1500  . [START ON 11/11/2016] pantoprazole  40 mg Intravenous Q12H  . piperacillin-tazobactam (ZOSYN)  IV  2.25 g Intravenous Q6H  . polyethylene glycol  17 g Oral BID  . senna-docusate  2 tablet Oral BID  . sodium chloride flush  3 mL Intravenous Q12H    Continuous Infusions: . sodium chloride 50 mL/hr at 11/07/16 0756  . pantoprozole (PROTONIX) infusion 8 mg/hr (11/08/16 0931)    PRN Meds: ALPRAZolam, HYDROmorphone (DILAUDID) injection, ipratropium-albuterol, LORazepam, menthol-cetylpyridinium, ondansetron **OR**  ondansetron (ZOFRAN) IV, oxyCODONE  Physical Exam    Constitutional: She has a sickly appearance. Nasal cannula in place.  Frail woman responsive only to painful stimuli   Eyes:  Eyes predominantly closed, when open she has a fixed stare  Neck: Normal range of motion.  Cardiovascular: Regular rate.  Pulmonary/Chest:  Periods of acute tachypnea with marked increased work of breathing, then reverts to normal rate with mild increased work of breathing.  Abdominal: Soft.  Musculoskeletal: She exhibits edema(Worsened today).  Neurological:  Opens eyes to physical stimuli, but fixed stare. Does not follow any commands.  Skin: Skin is warm and dry. There is pallor.  Psychiatric:  Short bursts of acute anxiety with load calling out and grimacing.         Vital Signs: BP (!) 150/102   Pulse 81   Temp (!) 96.3 F (35.7 C) (Axillary)   Resp 12   Ht 5' 2"  (1.575 m)   Wt 71.6 kg (157 lb 13.6 oz)   SpO2 100%   BMI 28.87 kg/m  SpO2: SpO2: 100 % O2 Device: O2 Device: High Flow Nasal Cannula O2 Flow Rate: O2 Flow Rate (L/min): 4 L/min  Intake/output summary:  Intake/Output Summary (Last 24 hours) at 11/08/16 1420 Last data filed at 11/08/16 1329  Gross per 24 hour  Intake          2310.83 ml  Output             1060  ml  Net          1250.83 ml   LBM: Last BM Date: 11/05/16 Baseline Weight: Weight: 66.2 kg (146 lb) Most recent weight: Weight: 71.6 kg (157 lb 13.6 oz)       Palliative Assessment/Data: PPS 10%   Flowsheet Rows   Flowsheet Row Most Recent Value  Intake Tab  Referral Department  Hospitalist  Unit at Time of Referral  ICU  Palliative Care Primary Diagnosis  Pulmonary  Date Notified  11/06/16  Palliative Care Type  New Palliative care  Reason for referral  Clarify Goals of Care  Date of Admission  11/02/16  # of days IP prior to Palliative referral  4  Clinical Assessment  Psychosocial & Spiritual Assessment  Palliative Care Outcomes      Patient Active  Problem List   Diagnosis Date Noted  . Goals of care, counseling/discussion   . Palliative care by specialist   . Chronic respiratory failure with hypoxia ([place]) 11/02/2016  . Sepsis ([place]) 11/02/2016  . Influenza A 11/02/2016  . Hypokalemia 11/02/2016  . Hypotension 11/02/2016  . Influenza 11/02/2016  . Severe sepsis ([place]) 11/02/2016  . Renal insufficiency 10/19/2016  . PAD (peripheral artery disease) ([place]) 10/19/2016  . Decreased hemoglobin 10/18/2016  . AAA (abdominal aortic aneurysm) without rupture ([place]) 10/03/2016  . Anxiety state 10/03/2016  . History of pulmonary embolism 10/03/2016  . Hyperglycemia 10/03/2016  . Anemia   . Chronic obstructive pulmonary disease ([place]) 09/29/2016  . Chronic diastolic CHF (congestive heart failure) ([place]) 09/06/2016  . AKI (acute kidney injury) ([place]) 09/06/2016  . Iliopsoas muscle hematoma, left, initial encounter 09/01/2016  . Intractable pain 09/01/2016  . Macrocytic anemia 09/01/2016  . Essential hypertension, benign 08/22/2015  . COPD with acute exacerbation ([place]) 08/12/2015  . Acute on chronic respiratory failure with hypoxia ([place]) 08/12/2015  . CAP (community acquired pneumonia) 08/12/2015  . Pain of right lower extremity 05/06/2014  . Swelling of limb 05/06/2014  . Atherosclerosis of native arteries of the extremities with intermittent claudication 07/18/2011    Palliative Care Assessment & Plan   HPI: 83 y.o. female  with past medical history of COPD with chronic hypoxic respiratory failure, dCHF, HTN, PAD, PE on Xarelto who presented to the ED from her nursing facility with fever, hypotension, and respiratory distress who was admitted on 11/02/2016 with work-up revealing influenza. She is on 3L O2 at baseline, and required escalation to BiPAP on admission. There was some improvement, however on 1/20 she acutely worsened and was transferred to stepdown. Suspect flu with triggered COPD exacerbation. Unfortunately, she continues to decline  despite interventions. She is increasingly confused, continues to have respiratory distress requiring HFNC, and now has progressive AKI (suspected due to prerenal azotemia in the setting of infection and hypotension). Palliative was consulted to facilitate goals of care conversations with her family in the setting of continued decline despite aggressive interventions.  Assessment: When I met with the patient's daughter and granddaughter yesterday they continued to express a hopefulness for recovery and an ongoing commitment to pursue aggressive interventions and re-assess on a daily basis. Today, I am seeing more concerning signs that Carrie Pena is deteriorating: rising creatinine, volume overload, ongoing unresponsive, and increasing medication use for periods of acute agitation/anxiety. I met with her family outside of the room and expressed my concerns with them, specifically bringing up that I am worried she may be dying. After discussion and prayer, they decided to transition to full comfort care. We discussed  plan to place her on a dilaudid drip to help manage her dyspnea and pain, and have IV medication available for symptoms, such as anxiety, agitation, secretions, and nausea. We will also remove her NG tube. They are interested in Hospice, however I expressed that first we need to see how she does here and assess if she could make the trip to residential hospice.   Recommendations/Plan:  DNR, full comfort care  Will consider transition to residential hospice tomorrow, based on her stability for transfer  Stop abx and labs, medications adjusted for comfort   Start dilaudid drip with PRN boluses available  Remove NG tube  Goals of Care and Additional Recommendations:  Limitations on Scope of Treatment: Full Comfort Care  Code Status:  DNR  Prognosis:   < 2 weeks  Discharge Planning:  To Be Determined  Care plan was discussed with pt's daughter and granddaughter Chauncey Reading),  primary team, care nurse.  Thank you for allowing the Palliative Medicine Team to assist in the care of this patient.  Time in: 1400 Time out: 1520  Total time: 90 minutes    Greater than 50%  of this time was spent counseling and coordinating care related to the above assessment and plan.  Charlynn Court, NP Palliative Medicine Team [phone] pager (7a-5p) Team Phone # [phone]

## 2016-11-08 NOTE — Progress Notes (Signed)
PROGRESS NOTE                                                                                                                                                                                                             Patient Demographics:    Carrie Pena, is a 83 y.o. female, DOB - January 29, 1928, ZOX:[id number]  Admit date - 11/02/2016   Admitting Physician Briscoe Deutscher, MD  Outpatient Primary MD for the patient is Thayer Headings, MD  LOS - 6   Chief Complaint  Patient presents with  . Hypotension       Brief Narrative   83 y.o. female with medical history significant for COPD with chronic hypoxic respiratory failure, hypertension, peripheral arterial disease, and history of pulmonary embolism on Xarelto, now presenting from her nursing facility for evaluation of fevers, respiratory distress, and hypotension, Workup significant for influenza, Requiring BiPAP , Initially improved off BiPAP, but recurrence respiratory distress, transferred to step down, on BiPAP since, alternating with high flow nasal cannula, worsening encephalopathy, is responsive, nothing by mouth, continues to have worsening renal function, most recent creatinine 3.6 today, 1/22 she developed coffee-ground emesis, significant drop in hemoglobin, transfuse 2 units PRBC 1/23, patient with significant decline, moaning in pain, uncomfortable due to dyspnea, palliative care consulted, patient transitioned to full comfort measure after discussion with daughter and granddaughter on 1/23   Subjective:    Carrie Pena today Lethargic, unable to provide any complaints,She is moaning, with coffee-ground emesis overnight with drop in her hemoglobin.  Assessment  & Plan :    Principal Problem:   Sepsis (HCC) Active Problems:   Essential hypertension, benign   Chronic diastolic CHF (congestive heart failure) (HCC)   AKI (acute kidney injury) (HCC)   Chronic obstructive pulmonary disease (HCC)   History of pulmonary  embolism   Anemia   Decreased hemoglobin   PAD (peripheral artery disease) (HCC)   Chronic respiratory failure with hypoxia (HCC)   Influenza A   Hypokalemia   Hypotension   Influenza   Severe sepsis (HCC)   Goals of care, counseling/discussion   Palliative care by specialist  Acute on chronic respiratory failure - Patient at baseline 3 L nasal cannula - Significant respiratory distress on admission and required BiPAP, weaned off BiPAP, has been on 4-5 L nasal cannula - On 1/20 a.m. with significant respiratory distress, transferred to stepdown, resumed on BiPAP, remains altered, in respiratory distress, continue with BiPAP,  alternating with high flow nasal cannula to give wrist from, current respiratory failure secondary to influenza, and  COPD exacerbation. - Currently comfort care  Sepsis, influenza A  - Pt meets sepsis criteria on admission, sxs primarily respiratory  - Blood and urine cultures  with no growth to date - Empiric vancomycin and Zosyn initiated in ED, Stopped vancomycin 1/19 , continue Zosyn - PCR positive for influenza A , treated with Tamiflu. - Minutes has been stopped as she is currently comfort care  COPD with acute exacerbation - Presents in respiratory distress with increased O2-requirement  - Started on systemic steroids with IV Solu-Medrol; continue nebs, supplemental O2   - IV steroids stopped  Acute kidney injury  - SCr is 1.93 on admission, up from apparent baseline of 0.8 ,18 continue trending up, today is 3.5 - Likely a prerenal azotemia in setting of acute infection with hypotension; ATN possible in this setting  - Avoid nephrotoxins, renally-dose medications,  - Renal ultrasound with no acute findings. - Creatinine of 3.6 today, no further labs, will DC renal consult  Hx of pulmonary embolism - No evidence for acute VTE  - Managed with Xarelto, is on hold given adrenal failure, on heparin GTT, has been stopped after coffee-ground emesis  overnight  Hypokalemia  - Repleted,   PAD  - cilostazol on hold  Anemia/upper GI bleed - With coffee-ground emesis overnight, her glycohemoglobin dropped to 6.8, transfused 2 units PRBC, a pre-GTT has been stopped  Abnormal urinalysis - No urinary symptoms, negative urine culture, no indication to treat  Code Status : DO NOT RESUSCITATE/DO NOT INTUBATE/currently full comfort measure  Family Communication  : Discussed with granddaughter at bedside.  Disposition Plan  : Ending clinical course over the last 24 hours, I would anticipate hospital death  Consults  : None  Procedures  :  2 units PRBC1/18, 2 units PRBC 1/23  DVT Prophylaxis  : Heparin gtt stopped 1/23  Lab Results  Component Value Date   PLT 192 11/08/2016    Antibiotics  :    Anti-infectives    Start     Dose/Rate Route Frequency Ordered Stop   11/06/16 1500  oseltamivir (TAMIFLU) capsule 30 mg  Status:  Discontinued     30 mg Oral Daily 11/06/16 1151 11/08/16 1453   11/04/16 1800  vancomycin (VANCOCIN) IVPB 1000 mg/200 mL premix  Status:  Discontinued     1,000 mg 200 mL/hr over 60 Minutes Intravenous Every 48 hours 11/02/16 1907 11/04/16 0900   11/04/16 0900  piperacillin-tazobactam (ZOSYN) IVPB 2.25 g  Status:  Discontinued     2.25 g 100 mL/hr over 30 Minutes Intravenous Every 6 hours 11/04/16 0714 11/08/16 1453   11/03/16 0100  piperacillin-tazobactam (ZOSYN) IVPB 3.375 g  Status:  Discontinued     3.375 g 12.5 mL/hr over 240 Minutes Intravenous Every 8 hours 11/02/16 1907 11/04/16 0714   11/02/16 2315  oseltamivir (TAMIFLU) capsule 30 mg  Status:  Discontinued     30 mg Oral Daily at bedtime 11/02/16 2251 11/06/16 1151   11/02/16 1830  piperacillin-tazobactam (ZOSYN) IVPB 3.375 g     3.375 g 100 mL/hr over 30 Minutes Intravenous  Once 11/02/16 1817 11/02/16 1904   11/02/16 1830  vancomycin (VANCOCIN) IVPB 1000 mg/200 mL premix     1,000 mg 200 mL/hr over 60 Minutes Intravenous  Once 11/02/16 1817  11/02/16 2148        Objective:   Vitals:   11/08/16 1100 11/08/16 1146 11/08/16 1200 11/08/16 1313  BP: 110/75  (!) 119/91 (!) 150/102  Pulse: 83  90 81  Resp: 11  16 12   Temp: 97.1 F (36.2 C)  97.4 F (36.3 C) (!) 96.3 F (35.7 C)  TempSrc: Axillary  Axillary Axillary  SpO2: 99% 100% 100% 100%  Weight:      Height:        Wt Readings from Last 3 Encounters:  11/08/16 71.6 kg (157 lb 13.6 oz)  10/25/16 66.2 kg (146 lb)  10/24/16 66.2 kg (146 lb)     Intake/Output Summary (Last 24 hours) at 11/08/16 1540 Last data filed at 11/08/16 1329  Gross per 24 hour  Intake          2310.83 ml  Output             1060 ml  Net          1250.83 ml     Physical Exam More lethargic, hard to arouse today,Morning. Symmetrical Chest wall movement, decreased air movement bilaterally, no wheezing, no crackles, use of accessory muscle  RRR,No Gallops,Rubs or new Murmurs, No Parasternal Heave +ve B.Sounds, Abd Soft, No tenderness,  No rebound - guarding or rigidity. No Cyanosis, Clubbing , No new Rash or bruise , anasarca    Data Review:    CBC  Recent Labs Lab 11/02/16 1817  11/05/16 0141 11/06/16 0348 11/07/16 0330 11/08/16 0017 11/08/16 0328 11/08/16 1433  WBC 14.7*  < > 10.7* 7.8 8.3 12.6* 7.7  --   HGB 8.0*  < > 8.2* 8.7* 8.0* 7.7* 6.8* 9.3*  HCT 23.9*  < > 24.9* 26.4* 24.6* 23.1* 21.0* 27.7*  PLT 235  < > 202 210 214 266 192  --   MCV 92.6  < > 90.2 93.3 93.2 91.3 92.1  --   MCH 31.0  < > 29.7 30.7 30.3 30.4 29.8  --   MCHC 33.5  < > 32.9 33.0 32.5 33.3 32.4  --   RDW 18.3*  < > 18.5* 18.7* 18.4* 18.2* 18.3*  --   LYMPHSABS 0.6*  --   --   --   --   --   --   --   MONOABS 0.3  --   --   --   --   --   --   --   EOSABS 0.0  --   --   --   --   --   --   --   BASOSABS 0.0  --   --   --   --   --   --   --   < > = values in this interval not displayed.  Chemistries   Recent Labs Lab 11/02/16 1817 11/03/16 0503 11/04/16 0521 11/05/16 0141 11/06/16 0348  11/07/16 0330 11/08/16 0328  NA 131* 135 136 135 139 139 142  K 3.1* 3.5 3.2* 4.0 4.7 5.0 4.8  CL 103 110 111 113* 115* 116* 117*  CO2 21* 17* 18* 16* 16* 15* 15*  GLUCOSE 103* 195* 144* 123* 100* 105* 125*  BUN 37* 33* 48* 46* 55* 63* 81*  CREATININE 1.93* 1.82* 2.27* 2.68* 2.88* 3.47* 3.63*  CALCIUM 6.6* 6.2* 7.1* 7.3* 7.6* 8.1* 8.3*  MG  --  2.2  --   --   --   --   --   AST 34 26  --   --   --   --   --   ALT 16 15  --   --   --   --   --   ALKPHOS 51 46  --   --   --   --   --  BILITOT 0.7 0.4  --   --   --   --   --    ------------------------------------------------------------------------------------------------------------------ No results for input(s): CHOL, HDL, LDLCALC, TRIG, CHOLHDL, LDLDIRECT in the last 72 hours.  No results found for: HGBA1C ------------------------------------------------------------------------------------------------------------------ No results for input(s): TSH, T4TOTAL, T3FREE, THYROIDAB in the last 72 hours.  Invalid input(s): FREET3 ------------------------------------------------------------------------------------------------------------------ No results for input(s): VITAMINB12, FOLATE, FERRITIN, TIBC, IRON, RETICCTPCT in the last 72 hours.  Coagulation profile  Recent Labs Lab 11/03/16 0036  INR 1.97    No results for input(s): DDIMER in the last 72 hours.  Cardiac Enzymes No results for input(s): CKMB, TROPONINI, MYOGLOBIN in the last 168 hours.  Invalid input(s): CK ------------------------------------------------------------------------------------------------------------------    Component Value Date/Time   BNP 54.1 10/21/2016 0553    Inpatient Medications  Scheduled Meds: . ipratropium-albuterol  3 mL Nebulization QID  . lidocaine  1 patch Transdermal Q24H  . mouth rinse  15 mL Mouth Rinse q12n4p  . methylPREDNISolone (SOLU-MEDROL) injection  60 mg Intravenous Q6H  . [START ON 11/11/2016] pantoprazole  40 mg  Intravenous Q12H   Continuous Infusions: . HYDROmorphone    . pantoprozole (PROTONIX) infusion 8 mg/hr (11/08/16 0931)   PRN Meds:.antiseptic oral rinse, glycopyrrolate, haloperidol lactate, HYDROmorphone, ipratropium-albuterol, LORazepam, ondansetron (ZOFRAN) IV, polyvinyl alcohol  Micro Results Recent Results (from the past 240 hour(s))  Blood Culture (routine x 2)     Status: None   Collection Time: 11/02/16  6:41 PM  Result Value Ref Range Status   Specimen Description BLOOD BLOOD RIGHT WRIST  Final   Special Requests BOTTLES DRAWN AEROBIC AND ANAEROBIC 10CC  Final   Culture   Final    NO GROWTH 5 DAYS Performed at [facility], [address] [place], [place] [address]    Report Status 11/08/2016 FINAL  Final  Blood Culture (routine x 2)     Status: None   Collection Time: 11/02/16  6:41 PM  Result Value Ref Range Status   Specimen Description BLOOD BLOOD RIGHT WRIST  Final   Special Requests BOTTLES DRAWN AEROBIC AND ANAEROBIC 10CC  Final   Culture   Final    NO GROWTH 5 DAYS Performed at [facility], [address] [place], [place] [address]    Report Status 11/08/2016 FINAL  Final  Urine culture     Status: None   Collection Time: 11/02/16 11:16 PM  Result Value Ref Range Status   Specimen Description URINE, CATHETERIZED  Final   Special Requests NONE  Final   Culture   Final    NO GROWTH Performed at [facility], [address] [place], [place] [address]    Report Status 11/04/2016 FINAL  Final  MRSA PCR Screening     Status: None   Collection Time: 11/03/16  7:17 PM  Result Value Ref Range Status   MRSA by PCR NEGATIVE NEGATIVE Final    Comment:        The GeneXpert MRSA Assay (FDA approved for NASAL specimens only), is one component of a comprehensive MRSA colonization surveillance program. It is not intended to diagnose MRSA infection nor to guide or monitor treatment for MRSA infections.   MRSA PCR Screening     Status: None     Collection Time: 11/05/16  9:35 AM  Result Value Ref Range Status   MRSA by PCR NEGATIVE NEGATIVE Final    Comment:        The GeneXpert MRSA Assay (FDA approved  for NASAL specimens only), is one component of a comprehensive MRSA colonization surveillance program. It is not intended to diagnose MRSA infection nor to guide or monitor treatment for MRSA infections.     Radiology Reports Dg Chest 2 View  Result Date: 10/11/2016 CLINICAL DATA:  Cough and shortness of breath tonight. Recent diagnosis of pulmonary embolus. EXAM: CHEST  2 VIEW COMPARISON:  Radiograph 03/02/2016, CT 10/03/2016 FINDINGS: Lower lung volumes from prior exam. Mild emphysema. Tortuous thoracic aorta with atherosclerosis. The heart is normal in size. No pulmonary edema. Minimal left infrahilar atelectasis. No confluent airspace disease, pleural effusion or pneumothorax. Advanced degenerative change of the left shoulder. IMPRESSION: Left lung base atelectasis. Thoracic aortic atherosclerosis. Electronically Signed   By: Rubye Oaks M.D.   On: 10/11/2016 00:08   US Renal  Result Date: 11/05/2016 CLINICAL DATA:  Renal failure EXAM: RENAL / URINARY TRACT ULTRASOUND COMPLETE COMPARISON:  CT scan September 30, 2016 FINDINGS: Right Kidney: Length: 10.1 cm. Nonshadowing echogenic foci may represent vascular calcifications seen on recent CT imaging. No hydronephrosis or acute abnormality. Left Kidney: Length: 10.5 cm. Bilateral echogenic nonshadowing foci may represent vascular calcifications as above. No hydronephrosis or acute abnormality. Bladder: Bladder not visualized at this time. IMPRESSION: 1. No cause for renal failure identified. The bladder is decompressed and not evaluated. 2. Small left pleural effusion. Electronically Signed   By: Gerome Sam III M.D   On: 11/05/2016 11:07   Dg Chest Port 1 View  Result Date: 11/08/2016 CLINICAL DATA:  83 year old female with hemoptysis and decreased O2 sat. EXAM:  PORTABLE CHEST 1 VIEW COMPARISON:  Chest radiograph dated 11/06/2016 and CT dated 10/03/2016 FINDINGS: There is emphysematous changes of the lungs. An area of scarring noted in the left infrahilar or lingular region. There is no focal consolidation, pleural effusion, or pneumothorax. The cardiac silhouette is within normal limits. There is atherosclerotic calcification of the thoracic aorta which appears tortuous. An enteric tube courses into the left upper abdomen with tip and side-port in the stomach. There is osteopenia with degenerative changes of the spine and left shoulder. No acute osseous pathology. IMPRESSION: No acute cardiopulmonary process. Emphysema. Electronically Signed   By: Elgie Collard M.D.   On: 11/08/2016 00:29   Dg Chest Port 1 View  Result Date: 11/06/2016 CLINICAL DATA:  Shortness of breath, weakness EXAM: PORTABLE CHEST 1 VIEW COMPARISON:  11/05/2016 FINDINGS: There is no focal parenchymal opacity. There is a trace left pleural effusion. There is mild bilateral interstitial thickening. There is no pneumothorax. The heart and mediastinal contours are unremarkable. Severe osteoarthritis of the left glenohumeral joint. Mild osteoarthritis of the right glenohumeral joint. IMPRESSION: Trace left pleural effusion. Electronically Signed   By: Elige Ko   On: 11/06/2016 11:32   Dg Chest Port 1 View  Result Date: 11/05/2016 CLINICAL DATA:  Increasing dyspnea. EXAM: PORTABLE CHEST 1 VIEW COMPARISON:  November 02, 2016 FINDINGS: Prominent skin fold over the left upper chest. Small bilateral pleural effusions and increased interstitial markings. No change in the cardiomediastinal silhouette. Chronic degenerative changes in the left shoulder. IMPRESSION: 1. Small pleural effusions and increased interstitial markings, suggesting mild edema. However, the patient does not have cardiomegaly. Recommend clinical correlation and follow-up to resolution. Electronically Signed   By: Gerome Sam  III M.D   On: 11/05/2016 09:14   Dg Chest Portable 1 View  Result Date: 11/02/2016 CLINICAL DATA:  Shortness of breath and cough EXAM: PORTABLE CHEST 1 VIEW COMPARISON:  10/23/2016 FINDINGS: Mild diffuse  interstitial opacities suggestive of chronic change. This is slightly increased at the bilateral lung bases and could reflect superimposed interstitial inflammation. Hazy atelectasis left base with possible tiny effusion. Stable cardiomediastinal silhouette with atherosclerosis. No pneumothorax. IMPRESSION: 1. Slight increased interstitial opacities within the bilateral lung bases could reflect acute interstitial inflammation on chronic change. Mild left CP angle atelectasis with possible tiny effusion. 2. Atherosclerosis of the aorta Electronically Signed   By: Jasmine PangKim  Fujinaga M.D.   On: 11/02/2016 22:03   Dg Chest Port 1 View  Result Date: 10/23/2016 CLINICAL DATA:  Shortness of Breath EXAM: PORTABLE CHEST 1 VIEW COMPARISON:  10/20/2016 FINDINGS: Cardiac shadow is stable. Aortic calcifications are again seen. Lungs are well aerated bilaterally. Interval clearing in the right base is noted. Some left basilar atelectasis is now seen however. Chronic degenerative change of the left shoulder joint is noted. No other focal abnormality is seen. IMPRESSION: Interval clearing of right basilar infiltrate. Left basilar atelectasis is now seen however. Electronically Signed   By: Alcide CleverMark  Lukens M.D.   On: 10/23/2016 08:18   Dg Chest Port 1 View  Result Date: 10/20/2016 CLINICAL DATA:  Shortness of breath. EXAM: PORTABLE CHEST 1 VIEW COMPARISON:  10/18/2016.  10/10/2016 . FINDINGS: Mediastinum hilar structures normal. Heart size normal. Low lung volumes with basilar atelectasis . Right base infiltrate suggesting pneumonia. No acute bony abnormality identified. IMPRESSION: Right base infiltrate noted consistent pneumonia. Low lung volumes with mild bibasilar atelectasis. Electronically Signed   By: Maisie Fushomas  Register   On:  10/20/2016 11:00   Dg Chest Port 1 View  Result Date: 10/18/2016 CLINICAL DATA:  Dyspnea EXAM: PORTABLE CHEST 1 VIEW COMPARISON:  10/10/2016 CXR FINDINGS: The heart size and mediastinal contours are within normal limits. Aortic atherosclerosis is noted. Patchy airspace disease at the right lung base suspicious for pneumonia. Subsegmental can platelike atelectasis at the left lung base. Osteoarthritis of the glenohumeral joints. IMPRESSION: Patchy new airspace opacity at the right lung base suspicious for pneumonia. Left lower lobe atelectasis. Electronically Signed   By: Tollie Ethavid  Kwon M.D.   On: 10/18/2016 23:58   Dg Abd Portable 1v  Result Date: 11/07/2016 CLINICAL DATA:  Encounter for nasogastric tube placement EXAM: PORTABLE ABDOMEN - 1 VIEW COMPARISON:  Portable exam 1714 hours compared to CT abdomen and pelvis 09/30/2016 FINDINGS: Tip of nasogastric tube is above the gastroesophageal junction. Visualized bowel gas pattern normal. Atherosclerotic calcifications and tortuosity of thoracic aorta. Lungs appear emphysematous. Bones demineralized. IMPRESSION: Tip of nasogastric tube is within the esophagus, above the gastroesophageal junction. Recommend advancing tube 11 cm to place proximal side-port within the gastric lumen. Findings called to Shanda BumpsJessica RN on 11/07/2016 at 1745 hours. Electronically Signed   By: Ulyses SouthwardMark  Boles M.D.   On: 11/07/2016 17:46    Sherra Kimmons M.D on 11/08/2016 at 3:40 PM  Between 7am to 7pm - Pager - [phone]  After 7pm go to [email or web address] - password [email or web address]  [facility] -  Office  [phone]

## 2016-11-09 ENCOUNTER — Other Ambulatory Visit: Payer: Self-pay | Admitting: *Deleted

## 2016-11-09 DIAGNOSIS — J101 Influenza due to other identified influenza virus with other respiratory manifestations: Secondary | ICD-10-CM

## 2016-11-09 DIAGNOSIS — J9621 Acute and chronic respiratory failure with hypoxia: Secondary | ICD-10-CM

## 2016-11-09 DIAGNOSIS — R410 Disorientation, unspecified: Secondary | ICD-10-CM

## 2016-11-09 DIAGNOSIS — I5032 Chronic diastolic (congestive) heart failure: Secondary | ICD-10-CM

## 2016-11-09 DIAGNOSIS — D62 Acute posthemorrhagic anemia: Secondary | ICD-10-CM

## 2016-11-09 DIAGNOSIS — Z515 Encounter for palliative care: Secondary | ICD-10-CM

## 2016-11-09 DIAGNOSIS — A419 Sepsis, unspecified organism: Principal | ICD-10-CM

## 2016-11-09 DIAGNOSIS — R6520 Severe sepsis without septic shock: Secondary | ICD-10-CM

## 2016-11-09 DIAGNOSIS — N179 Acute kidney failure, unspecified: Secondary | ICD-10-CM

## 2016-11-09 MED ORDER — BISACODYL 10 MG RE SUPP: 10.0000 mg | Freq: Once | RECTAL | Status: DC

## 2016-11-09 MED ORDER — PANTOPRAZOLE SODIUM 40 MG IV SOLR
40.0000 mg | Freq: Every day | INTRAVENOUS | Status: DC
Start: 2016-11-10 — End: 2016-11-13
  Administered 2016-11-10 – 2016-11-13 (×4): 40 mg via INTRAVENOUS
  Filled 2016-11-09 (×4): qty 40

## 2016-11-09 MED ORDER — METHYLPREDNISOLONE SODIUM SUCC 40 MG IJ SOLR
40.0000 mg | Freq: Four times a day (QID) | INTRAMUSCULAR | Status: DC
  Administered 2016-11-09 – 2016-11-13 (×15): 40 mg via INTRAVENOUS
  Filled 2016-11-09 (×15): qty 1

## 2016-11-09 MED ORDER — MIDAZOLAM HCL 2 MG/2ML IJ SOLN
0.5000 mg | INTRAMUSCULAR | Status: DC | PRN
  Administered 2016-11-10 – 2016-11-11 (×2): 0.5 mg via INTRAVENOUS
  Filled 2016-11-09 (×2): qty 2

## 2016-11-09 MED ORDER — MIDAZOLAM HCL 2 MG/2ML IJ SOLN
1.0000 mg | INTRAMUSCULAR | Status: DC
  Administered 2016-11-09 – 2016-11-10 (×6): 1 mg via INTRAVENOUS
  Filled 2016-11-09 (×6): qty 2

## 2016-11-09 NOTE — Patient Outreach (Signed)
[facility] Care Management  11/09/2016  Carrie BaizeJeanne R Pena 09/02/1928 [id number]   CSW received an In Basket message from [facility], [facility] with [facility] Management, indicating that patient is now under the care of the Palliative Medicine Team.  After thorough review of patient's EMR (Electronic Medical Record) in EP[employer], CSW noted that patient has been placed on a dilaudid drip for full comfort measures.  CSW was able to make contact with patient's daughter, Carrie Pena today to offer counseling and supportive services regarding patient's rapid decline in health.  CSW will perform a case closure on patient, due to patient being under the care of the Palliative Care Team, as death appears to be imminent and full comfort measures are in place.  No additional social work needs have been identified at this time.  CSW will fax an update to patient's Primary Care Physician, Dr. Tawana ScaleBrain Mackenzie to ensure that they are aware of CSW's involvement with patient's plan of care.  CSW will submit a case closure request to Sherle PoeNicole Robinson, Care Management Assistant with [facility], in the form of an In [employer]asket message.   Danford BadJoanna Celine Dishman, BSW, MSW, LCSW  Licensed Restaurant manager, fast foodClinical Social Worker  [facility] Care Management [facility]  [place], [place], [place] [address] Physical Address-[address], [place], [place] [address] Toll Free Main # [phone] Fax # [phone] Cell # [phone]  Office # [phone] [email or web address]

## 2016-11-09 NOTE — Progress Notes (Signed)
CSW met with pt's granddaughter at bedside to offer choice for residential hospice home placement.Granddaughter has chosen [facility] for placement. Marzetta Board, liaison, from [facility] has been contacted and referral provided. Marzetta Board will contact CSW with update on bed availability. CSW will continue to follow to assist with d/c planning needs.  Werner Lean LCSW [phone]

## 2016-11-09 NOTE — Progress Notes (Signed)
Daily Progress Note   Patient Name: Carrie Pena       Date: 11/09/2016 DOB: 1928-05-09  Age: 83 y.o. MRN#: [id number] Attending Physician: Debbe Odea, MD Primary Care Physician: Thressa Sheller, MD Admit Date: 11/02/2016  Reason for Consultation/Follow-up: Establishing goals of care and Psychosocial/spiritual support  Subjective: Carrie Pena was transitioned to full comfort care yesterday afternoon. She does appear stable today in terms of her vitals. She continues to have acute periods of agitation and anxiety wherein she calls out, moans, and places her arms above her face. Her family is concerned that she is hallucinating, however I suspect it is more a terminal delirium.  Length of Stay: 7  Current Medications: Scheduled Meds:  . bisacodyl  10 mg Rectal Once  . lidocaine  1 patch Transdermal Q24H  . mouth rinse  15 mL Mouth Rinse q12n4p  . methylPREDNISolone (SOLU-MEDROL) injection  40 mg Intravenous Q6H  . midazolam  1 mg Intravenous Q4H  . [START ON 11/10/2016] pantoprazole  40 mg Intravenous Daily    Continuous Infusions: . HYDROmorphone 0.5 mg/hr (11/08/16 2027)    PRN Meds: antiseptic oral rinse, glycopyrrolate, HYDROmorphone, ipratropium-albuterol, midazolam, ondansetron (ZOFRAN) IV, polyvinyl alcohol  Physical Exam    Constitutional: She has a sickly appearance. Nasal cannula in place.  Frail woman awake, but non-interactive.  Eyes:  Eyes predominantly closed, when open she has a fixed stare  Neck: Normal range of motion.  Cardiovascular: Regular rate.  Pulmonary/Chest:  Periods of acute tachypnea with marked increased work of breathing, then reverts to normal rate with mild increased work of breathing. Slight wheeze. Abdominal: Soft.  Musculoskeletal: She exhibits edema(Worsened again today).  Neurological:   Opens eyes to physical stimuli, but fixed stare. Does not follow any commands. Non-intearctive. Skin: Skin is warm and dry. There is pallor.  Significant bruising and edema. Psychiatric:  Short bursts of acute anxiety with calling out and grimacing.         Vital Signs: BP 99/72 (BP Location: Left Leg)   Pulse 100   Temp 97.2 F (36.2 C) (Axillary)   Resp 10   Ht _0  (1.575 m)   Wt 71.6 kg (157 lb 13.6 oz)   SpO2 94%   BMI 28.87 kg/m  SpO2: SpO2: 94 % O2 Device: O2 Device: Not Delivered O2 Flow Rate: O2 Flow Rate (L/min): 2 L/min  Intake/output summary:   Intake/Output Summary (Last 24 hours) at 11/09/16 1457 Last data filed at 11/09/16 1200  Gross per 24 hour  Intake           619.95 ml  Output             2000 ml  Net         -1380.05 ml   LBM: Last BM Date: 11/05/16; large BM today per care nurse. Baseline Weight: Weight: 66.2 kg (146 lb) Most recent weight: Weight: 71.6 kg (157 lb 13.6 oz)       Palliative Assessment/Data: PPS 10%   Flowsheet Rows   Flowsheet Row Most Recent Value  Intake Tab  Referral Department  Hospitalist  Unit at Time of Referral  ICU  Palliative Care Primary Diagnosis  Pulmonary  Date Notified  11/06/16  Palliative Care Type  New Palliative care  Reason for referral  Clarify Goals of Care  Date of Admission  11/02/16  # of days IP prior to Palliative referral  4  Clinical Assessment  Psychosocial & Spiritual Assessment  Palliative Care Outcomes      Patient Active Problem List   Diagnosis Date Noted  . Goals of care, counseling/discussion   . Palliative care by specialist   . Influenza A 11/02/2016  . Hypokalemia 11/02/2016  . Hypotension 11/02/2016  . Influenza 11/02/2016  . Severe sepsis ([place]) 11/02/2016  . Renal insufficiency 10/19/2016  . PAD (peripheral artery disease) ([place]) 10/19/2016  . AAA (abdominal aortic aneurysm) without rupture ([place]) 10/03/2016  . Anxiety state 10/03/2016  . History of pulmonary embolism  10/03/2016  . Hyperglycemia 10/03/2016  . Anemia associated with acute blood loss   . Chronic obstructive pulmonary disease ([place]) 09/29/2016  . Chronic diastolic CHF (congestive heart failure) ([place]) 09/06/2016  . AKI (acute kidney injury) ([place]) 09/06/2016  . Iliopsoas muscle hematoma, left, initial encounter 09/01/2016  . Intractable pain 09/01/2016  . Macrocytic anemia 09/01/2016  . Essential hypertension, benign 08/22/2015  . COPD with acute exacerbation ([place]) 08/12/2015  . Acute on chronic respiratory failure with hypoxia ([place]) 08/12/2015  . CAP (community acquired pneumonia) 08/12/2015  . Pain of right lower extremity 05/06/2014  . Swelling of limb 05/06/2014  . Atherosclerosis of native arteries of the extremities with intermittent claudication 07/18/2011    Palliative Care Assessment & Plan   HPI: 83 y.o. female  with past medical history of COPD with chronic hypoxic respiratory failure, dCHF, HTN, PAD, PE on Xarelto who presented to the ED from her nursing facility with fever, hypotension, and respiratory distress who was admitted on 11/02/2016 with work-up revealing influenza. She is on 3L O2 at baseline, and required escalation to BiPAP on admission. There was some improvement, however on 1/20 she acutely worsened and was transferred to stepdown. Suspect flu with triggered COPD exacerbation. Unfortunately, she continues to decline despite interventions. She is increasingly confused, continues to have respiratory distress requiring HFNC, and now has progressive AKI (suspected due to prerenal azotemia in the setting of infection and hypotension). Palliative was consulted to facilitate goals of care conversations with her family in the setting of continued decline despite aggressive interventions.  Assessment: When I met with the patient's daughter and granddaughter 1/22 they continued to express a hopefulness for recovery and an ongoing commitment to pursue aggressive interventions and  re-assess on a daily basis. 1/23, I saw more concerning signs that Carrie Pena is deteriorating: rising creatinine, volume overload, ongoing unresponsive, and increasing medication use for periods of acute agitation/anxiety. I met with her family outside of the room and expressed my concerns with them, specifically bringing up that I am worried she may be dying. After discussion and prayer, they decided to transition to full comfort care. We discussed plan to place her on a dilaudid drip to help manage her dyspnea and pain, and have IV medication available for symptoms, such as anxiety, agitation, secretions, and nausea. We will also removed her NG tube. They are interested in Hospice, however I expressed that first we need to see how she does here and assess if she could make the trip to residential hospice.   Today, Mrs. Osterman appears stable enough to consider transitioning to residential hospice. Her breathing effort has improved on the dilaudid drip, however she continues to have periods of significant agitation/anxiety. Her family wants to  take her home to die there, however I remain concerned that she will need escalating symptom management. That said, I did discuss the option of transitioning to residential hospice, and if she stabilized on an oral regimen, they could consider transitioning her home from there.   Recommendations/Plan:  DNR, full comfort care  No abx, labs, or further imaging  Family wants to leave on bedside telemetry monitor; which I am fine with  Consult to SW placed to provide options for residential Hospice placement  Symptoms  Continue dilaudid drip with PRN boluses for pain and dyspnea  Start sch and PRN Versed for terminal delirium   Will order Dulcolax PR once today, as no BM for four days   Goals of Care and Additional Recommendations:  Limitations on Scope of Treatment: Full Comfort Care  Code Status:  DNR  Prognosis:   < 2 weeks  Discharge  Planning:  Hospice facility  Care plan was discussed with care nurse, primary team, pt's daughter and granddaughter.  Thank you for allowing the Palliative Medicine Team to assist in the care of this patient.  Total time: 70 minutes  Time: 4967-5916; 3846-6599; 1450-1510     Greater than 50%  of this time was spent counseling and coordinating care related to the above assessment and plan.  Charlynn Court, NP Palliative Medicine Team [phone] pager (7a-5p) Team Phone # [phone]

## 2016-11-09 NOTE — Discharge Summary (Addendum)
Physician Discharge Summary  SHANTERA MONTS ZOX:[id number] DOB: September 26, 1928 DOA: 11/02/2016  PCP: Thayer Headings, MD  Admit date: 11/02/2016 Discharge date: 11/13/2016  Admitted From: home  Disposition:  Hospice home     Discharge Diagnoses:  Principal Problem:   Acute on chronic respiratory failure with hypoxia (HCC) Active Problems:   Severe sepsis (HCC)   Essential hypertension, benign   Chronic diastolic CHF (congestive heart failure) (HCC)   AKI (acute kidney injury) (HCC)   Chronic obstructive pulmonary disease (HCC)   AAA (abdominal aortic aneurysm) without rupture (HCC)   History of pulmonary embolism   Anemia associated with acute blood loss   PAD (peripheral artery disease) (HCC)   Influenza A   Hypokalemia   Hypotension   Influenza   Goals of care, counseling/discussion   Palliative care by specialist   Brief Summary: 83 y.o.femalewith medical history significant for COPD with chronic hypoxic respiratory failure, hypertension, peripheral arterial disease, and history of pulmonary embolism on Xarelto, now presenting from her nursing facility for evaluation of fevers, respiratory distress, and hypotension. Workup positive for influenza.  Initially improved with BiPAP but then developed recurrent respiratory distress, was transferred to step down and again started on BiPAP. She developed worsening encephalopathy, declining renal function, had coffee-ground emesis with a significant drop in hemoglobin. She was transfused 2 units PRBC 1/23. Palliative care consulted and patient transitioned to full comfort measure after discussion with daughter and granddaughter on 1/23  Hospital Course:  Acute on chronic respiratory failure - Patient is, at baseline, usualy on 3 L nasal cannula - Significant respiratory distress on admission requiring BiPAP- eventually weaned off BiPAP, has been on 4-5 L nasal cannula - On 1/20 a.m. once again developed significant respiratory  distress, transferred to stepdown, resumed on BiPAP - suspected to be secondary to influenza, and COPD exacerbation. - Currently comfort care  Sepsis, influenza A  - Pt meets sepsis criteria on admission, sxs primarily respiratory  - Blood and urine cultures  with no growth to date - Empiric vancomycin and Zosyn initiated in ED, Stopped vancomycin 1/19 , continue Zosyn - PCR positive for influenza A , treated with Tamiflu.  COPD with acute exacerbation - Presents in respiratory distress with increased O2-requirement  - Started on systemic steroids with IV Solu-Medrol; continue nebs, supplemental O2  - IV steroids stopped  Acute kidney injury  - SCr is 1.93 on admission, up from apparent baseline of 0.8 ,18 continue trended up to 3.6 - Likely a prerenal azotemia and ATN in setting of acute infection with hypotension  - Renal ultrasound with no acute findings.  Hx of pulmonary embolism - No evidence for acute VTE  - Managed with Xarelto as outpt which was held given renal failure- she was started on heparin GTT which has been stopped after coffee-ground emesis    Anemia/upper GI bleed - With coffee-ground emesis - her hemoglobin dropped to 6.8, transfused 2 units PRBC and Heparin stopped- started on PPI infusion  Abnormal urinalysis - No urinary symptoms, negative urine culture, no indication to treat  Hypokalemia  - Repleted,   PAD  - cilostazol on hold  Discharge Instructions   Allergies as of 11/09/2016      Reactions   Tylenol [acetaminophen] Other (See Comments)   Reaction:  Shaking       Medication List    STOP taking these medications   alendronate 70 MG tablet Commonly known as:  FOSAMAX   ALPRAZolam 0.25 MG tablet Commonly known as:  Prudy Feeler  aspirin EC 81 MG tablet   atorvastatin 20 MG tablet Commonly known as:  LIPITOR   budesonide-formoterol 160-4.5 MCG/ACT inhaler Commonly known as:  SYMBICORT   cilostazol 100 MG tablet Commonly known as:   PLETAL   ICY HOT 5 % Ptch Generic drug:  Menthol   ipratropium-albuterol 0.5-2.5 (3) MG/3ML Soln Commonly known as:  DUONEB   loperamide 2 MG tablet Commonly known as:  IMODIUM A-D   omeprazole 20 MG capsule Commonly known as:  PRILOSEC   oxycodone 5 MG capsule Commonly known as:  OXY-IR   polyethylene glycol packet Commonly known as:  MIRALAX / GLYCOLAX   PROCEL Powd   Rivaroxaban 15 MG Tabs tablet Commonly known as:  XARELTO   senna-docusate 8.6-50 MG tablet Commonly known as:  Senokot-S     TAKE these medications   OXYGEN Inhale 2 L into the lungs daily as needed (for low O2 sats).       Allergies  Allergen Reactions  . Tylenol [Acetaminophen] Other (See Comments)    Reaction:  Shaking      Procedures/Studies:   Dg Chest 2 View  Result Date: 10/11/2016 CLINICAL DATA:  Cough and shortness of breath tonight. Recent diagnosis of pulmonary embolus. EXAM: CHEST  2 VIEW COMPARISON:  Radiograph 03/02/2016, CT 10/03/2016 FINDINGS: Lower lung volumes from prior exam. Mild emphysema. Tortuous thoracic aorta with atherosclerosis. The heart is normal in size. No pulmonary edema. Minimal left infrahilar atelectasis. No confluent airspace disease, pleural effusion or pneumothorax. Advanced degenerative change of the left shoulder. IMPRESSION: Left lung base atelectasis. Thoracic aortic atherosclerosis. Electronically Signed   By: Rubye Oaks M.D.   On: 10/11/2016 00:08   US Renal  Result Date: 11/05/2016 CLINICAL DATA:  Renal failure EXAM: RENAL / URINARY TRACT ULTRASOUND COMPLETE COMPARISON:  CT scan September 30, 2016 FINDINGS: Right Kidney: Length: 10.1 cm. Nonshadowing echogenic foci may represent vascular calcifications seen on recent CT imaging. No hydronephrosis or acute abnormality. Left Kidney: Length: 10.5 cm. Bilateral echogenic nonshadowing foci may represent vascular calcifications as above. No hydronephrosis or acute abnormality. Bladder: Bladder not  visualized at this time. IMPRESSION: 1. No cause for renal failure identified. The bladder is decompressed and not evaluated. 2. Small left pleural effusion. Electronically Signed   By: Gerome Sam III M.D   On: 11/05/2016 11:07   Dg Chest Port 1 View  Result Date: 11/08/2016 CLINICAL DATA:  83 year old female with hemoptysis and decreased O2 sat. EXAM: PORTABLE CHEST 1 VIEW COMPARISON:  Chest radiograph dated 11/06/2016 and CT dated 10/03/2016 FINDINGS: There is emphysematous changes of the lungs. An area of scarring noted in the left infrahilar or lingular region. There is no focal consolidation, pleural effusion, or pneumothorax. The cardiac silhouette is within normal limits. There is atherosclerotic calcification of the thoracic aorta which appears tortuous. An enteric tube courses into the left upper abdomen with tip and side-port in the stomach. There is osteopenia with degenerative changes of the spine and left shoulder. No acute osseous pathology. IMPRESSION: No acute cardiopulmonary process. Emphysema. Electronically Signed   By: Elgie Collard M.D.   On: 11/08/2016 00:29   Dg Chest Port 1 View  Result Date: 11/06/2016 CLINICAL DATA:  Shortness of breath, weakness EXAM: PORTABLE CHEST 1 VIEW COMPARISON:  11/05/2016 FINDINGS: There is no focal parenchymal opacity. There is a trace left pleural effusion. There is mild bilateral interstitial thickening. There is no pneumothorax. The heart and mediastinal contours are unremarkable. Severe osteoarthritis of the left glenohumeral joint. Mild  osteoarthritis of the right glenohumeral joint. IMPRESSION: Trace left pleural effusion. Electronically Signed   By: Elige KoHetal  Patel   On: 11/06/2016 11:32   Dg Chest Port 1 View  Result Date: 11/05/2016 CLINICAL DATA:  Increasing dyspnea. EXAM: PORTABLE CHEST 1 VIEW COMPARISON:  November 02, 2016 FINDINGS: Prominent skin fold over the left upper chest. Small bilateral pleural effusions and increased  interstitial markings. No change in the cardiomediastinal silhouette. Chronic degenerative changes in the left shoulder. IMPRESSION: 1. Small pleural effusions and increased interstitial markings, suggesting mild edema. However, the patient does not have cardiomegaly. Recommend clinical correlation and follow-up to resolution. Electronically Signed   By: Gerome Samavid  Williams III M.D   On: 11/05/2016 09:14   Dg Chest Portable 1 View  Result Date: 11/02/2016 CLINICAL DATA:  Shortness of breath and cough EXAM: PORTABLE CHEST 1 VIEW COMPARISON:  10/23/2016 FINDINGS: Mild diffuse interstitial opacities suggestive of chronic change. This is slightly increased at the bilateral lung bases and could reflect superimposed interstitial inflammation. Hazy atelectasis left base with possible tiny effusion. Stable cardiomediastinal silhouette with atherosclerosis. No pneumothorax. IMPRESSION: 1. Slight increased interstitial opacities within the bilateral lung bases could reflect acute interstitial inflammation on chronic change. Mild left CP angle atelectasis with possible tiny effusion. 2. Atherosclerosis of the aorta Electronically Signed   By: Jasmine PangKim  Fujinaga M.D.   On: 11/02/2016 22:03   Dg Chest Port 1 View  Result Date: 10/23/2016 CLINICAL DATA:  Shortness of Breath EXAM: PORTABLE CHEST 1 VIEW COMPARISON:  10/20/2016 FINDINGS: Cardiac shadow is stable. Aortic calcifications are again seen. Lungs are well aerated bilaterally. Interval clearing in the right base is noted. Some left basilar atelectasis is now seen however. Chronic degenerative change of the left shoulder joint is noted. No other focal abnormality is seen. IMPRESSION: Interval clearing of right basilar infiltrate. Left basilar atelectasis is now seen however. Electronically Signed   By: Alcide CleverMark  Lukens M.D.   On: 10/23/2016 08:18   Dg Chest Port 1 View  Result Date: 10/20/2016 CLINICAL DATA:  Shortness of breath. EXAM: PORTABLE CHEST 1 VIEW COMPARISON:   10/18/2016.  10/10/2016 . FINDINGS: Mediastinum hilar structures normal. Heart size normal. Low lung volumes with basilar atelectasis . Right base infiltrate suggesting pneumonia. No acute bony abnormality identified. IMPRESSION: Right base infiltrate noted consistent pneumonia. Low lung volumes with mild bibasilar atelectasis. Electronically Signed   By: Maisie Fushomas  Register   On: 10/20/2016 11:00   Dg Chest Port 1 View  Result Date: 10/18/2016 CLINICAL DATA:  Dyspnea EXAM: PORTABLE CHEST 1 VIEW COMPARISON:  10/10/2016 CXR FINDINGS: The heart size and mediastinal contours are within normal limits. Aortic atherosclerosis is noted. Patchy airspace disease at the right lung base suspicious for pneumonia. Subsegmental can platelike atelectasis at the left lung base. Osteoarthritis of the glenohumeral joints. IMPRESSION: Patchy new airspace opacity at the right lung base suspicious for pneumonia. Left lower lobe atelectasis. Electronically Signed   By: Tollie Ethavid  Kwon M.D.   On: 10/18/2016 23:58   Dg Abd Portable 1v  Result Date: 11/07/2016 CLINICAL DATA:  Encounter for nasogastric tube placement EXAM: PORTABLE ABDOMEN - 1 VIEW COMPARISON:  Portable exam 1714 hours compared to CT abdomen and pelvis 09/30/2016 FINDINGS: Tip of nasogastric tube is above the gastroesophageal junction. Visualized bowel gas pattern normal. Atherosclerotic calcifications and tortuosity of thoracic aorta. Lungs appear emphysematous. Bones demineralized. IMPRESSION: Tip of nasogastric tube is within the esophagus, above the gastroesophageal junction. Recommend advancing tube 11 cm to place proximal side-port within the gastric  lumen. Findings called to Shanda Bumps RN on 11/07/2016 at 1745 hours. Electronically Signed   By: Ulyses Southward M.D.   On: 11/07/2016 17:46       Discharge Exam: Vitals:   11/09/16 0800 11/09/16 1000  BP:    Pulse: 87 78  Resp: 10 10   Vitals:   11/09/16 0500 11/09/16 0600 11/09/16 0800 11/09/16 1000  BP:       Pulse: 82 78 87 78  Resp: 10 12 10 10   Temp:      TempSrc:      SpO2: 92% 93% 94% 91%  Weight:      Height:        General: Pt is alert, awake, not in acute distress Cardiovascular: RRR, S1/S2 +, no rubs, no gallops Respiratory: CTA bilaterally, no wheezing, no rhonchi Abdominal: Soft, NT, ND, bowel sounds + Extremities: no edema, no cyanosis    The results of significant diagnostics from this hospitalization (including imaging, microbiology, ancillary and laboratory) are listed below for reference.     Microbiology: Recent Results (from the past 240 hour(s))  Blood Culture (routine x 2)     Status: None   Collection Time: 11/02/16  6:41 PM  Result Value Ref Range Status   Specimen Description BLOOD BLOOD RIGHT WRIST  Final   Special Requests BOTTLES DRAWN AEROBIC AND ANAEROBIC 10CC  Final   Culture   Final    NO GROWTH 5 DAYS Performed at [facility], [address] [place], [place] [address]    Report Status 11/08/2016 FINAL  Final  Blood Culture (routine x 2)     Status: None   Collection Time: 11/02/16  6:41 PM  Result Value Ref Range Status   Specimen Description BLOOD BLOOD RIGHT WRIST  Final   Special Requests BOTTLES DRAWN AEROBIC AND ANAEROBIC 10CC  Final   Culture   Final    NO GROWTH 5 DAYS Performed at [facility], [address] [place], [place] [address]    Report Status 11/08/2016 FINAL  Final  Urine culture     Status: None   Collection Time: 11/02/16 11:16 PM  Result Value Ref Range Status   Specimen Description URINE, CATHETERIZED  Final   Special Requests NONE  Final   Culture   Final    NO GROWTH Performed at [facility], [address], [place], [place] [address]    Report Status 11/04/2016 FINAL  Final  MRSA PCR Screening     Status: None   Collection Time: 11/03/16  7:17 PM  Result Value Ref Range Status   MRSA by PCR NEGATIVE NEGATIVE Final    Comment:        The GeneXpert MRSA Assay (FDA approved for NASAL  specimens only), is one component of a comprehensive MRSA colonization surveillance program. It is not intended to diagnose MRSA infection nor to guide or monitor treatment for MRSA infections.   MRSA PCR Screening     Status: None   Collection Time: 11/05/16  9:35 AM  Result Value Ref Range Status   MRSA by PCR NEGATIVE NEGATIVE Final    Comment:        The GeneXpert MRSA Assay (FDA approved for NASAL specimens only), is one component of a comprehensive MRSA colonization surveillance program. It is not intended to diagnose MRSA infection nor to guide or monitor treatment for MRSA infections.      Labs: BNP (last 3 results)  Recent Labs  10/03/16  1621 10/10/16 2353 10/21/16 0553  BNP 48.8 37.4 54.1   Basic Metabolic Panel:  Recent Labs Lab 11/03/16 0503 11/04/16 0521 11/05/16 0141 11/06/16 0348 11/07/16 0330 11/08/16 0328  NA 135 136 135 139 139 142  K 3.5 3.2* 4.0 4.7 5.0 4.8  CL 110 111 113* 115* 116* 117*  CO2 17* 18* 16* 16* 15* 15*  GLUCOSE 195* 144* 123* 100* 105* 125*  BUN 33* 48* 46* 55* 63* 81*  CREATININE 1.82* 2.27* 2.68* 2.88* 3.47* 3.63*  CALCIUM 6.2* 7.1* 7.3* 7.6* 8.1* 8.3*  MG 2.2  --   --   --   --   --    Liver Function Tests:  Recent Labs Lab 11/02/16 1817 11/03/16 0503  AST 34 26  ALT 16 15  ALKPHOS 51 46  BILITOT 0.7 0.4  PROT 5.4* 5.0*  ALBUMIN 2.3* 2.0*   No results for input(s): LIPASE, AMYLASE in the last 168 hours. No results for input(s): AMMONIA in the last 168 hours. CBC:  Recent Labs Lab 11/02/16 1817  11/05/16 0141 11/06/16 0348 11/07/16 0330 11/08/16 0017 11/08/16 0328 11/08/16 1433  WBC 14.7*  < > 10.7* 7.8 8.3 12.6* 7.7  --   NEUTROABS 13.8*  --   --   --   --   --   --   --   HGB 8.0*  < > 8.2* 8.7* 8.0* 7.7* 6.8* 9.3*  HCT 23.9*  < > 24.9* 26.4* 24.6* 23.1* 21.0* 27.7*  MCV 92.6  < > 90.2 93.3 93.2 91.3 92.1  --   PLT 235  < > 202 210 214 266 192  --   < > = values in this interval not  displayed. Cardiac Enzymes: No results for input(s): CKTOTAL, CKMB, CKMBINDEX, TROPONINI in the last 168 hours. BNP: Invalid input(s): POCBNP CBG:  Recent Labs Lab 11/04/16 0831 11/05/16 0752 11/06/16 0741 11/07/16 0820 11/08/16 0846  GLUCAP 118* 111* 105* 113* 132*   D-Dimer No results for input(s): DDIMER in the last 72 hours. Hgb A1c No results for input(s): HGBA1C in the last 72 hours. Lipid Profile No results for input(s): CHOL, HDL, LDLCALC, TRIG, CHOLHDL, LDLDIRECT in the last 72 hours. Thyroid function studies No results for input(s): TSH, T4TOTAL, T3FREE, THYROIDAB in the last 72 hours.  Invalid input(s): FREET3 Anemia work up No results for input(s): VITAMINB12, FOLATE, FERRITIN, TIBC, IRON, RETICCTPCT in the last 72 hours. Urinalysis    Component Value Date/Time   COLORURINE AMBER (A) 11/02/2016 2316   APPEARANCEUR TURBID (A) 11/02/2016 2316   LABSPEC 1.019 11/02/2016 2316   PHURINE 5.0 11/02/2016 2316   GLUCOSEU NEGATIVE 11/02/2016 2316   HGBUR MODERATE (A) 11/02/2016 2316   BILIRUBINUR NEGATIVE 11/02/2016 2316   KETONESUR NEGATIVE 11/02/2016 2316   PROTEINUR 30 (A) 11/02/2016 2316   UROBILINOGEN 0.2 11/08/2009 1426   NITRITE NEGATIVE 11/02/2016 2316   LEUKOCYTESUR NEGATIVE 11/02/2016 2316   Sepsis Labs Invalid input(s): PROCALCITONIN,  WBC,  LACTICIDVEN Microbiology Recent Results (from the past 240 hour(s))  Blood Culture (routine x 2)     Status: None   Collection Time: 11/02/16  6:41 PM  Result Value Ref Range Status   Specimen Description BLOOD BLOOD RIGHT WRIST  Final   Special Requests BOTTLES DRAWN AEROBIC AND ANAEROBIC 10CC  Final   Culture   Final    NO GROWTH 5 DAYS Performed at [facility], [address] [place], [place] [address]    Report Status 11/08/2016 FINAL  Final  Blood Culture (routine x 2)     Status: None   Collection Time: 11/02/16  6:41 PM  Result Value Ref Range Status   Specimen Description BLOOD BLOOD RIGHT  WRIST  Final   Special Requests BOTTLES DRAWN AEROBIC AND ANAEROBIC 10CC  Final   Culture   Final    NO GROWTH 5 DAYS Performed at [facility], [address] [place], [place] [address]    Report Status 11/08/2016 FINAL  Final  Urine culture     Status: None   Collection Time: 11/02/16 11:16 PM  Result Value Ref Range Status   Specimen Description URINE, CATHETERIZED  Final   Special Requests NONE  Final   Culture   Final    NO GROWTH Performed at [facility], [address], [place], [place] [address]    Report Status 11/04/2016 FINAL  Final  MRSA PCR Screening     Status: None   Collection Time: 11/03/16  7:17 PM  Result Value Ref Range Status   MRSA by PCR NEGATIVE NEGATIVE Final    Comment:        The GeneXpert MRSA Assay (FDA approved for NASAL specimens only), is one component of a comprehensive MRSA colonization surveillance program. It is not intended to diagnose MRSA infection nor to guide or monitor treatment for MRSA infections.   MRSA PCR Screening     Status: None   Collection Time: 11/05/16  9:35 AM  Result Value Ref Range Status   MRSA by PCR NEGATIVE NEGATIVE Final    Comment:        The GeneXpert MRSA Assay (FDA approved for NASAL specimens only), is one component of a comprehensive MRSA colonization surveillance program. It is not intended to diagnose MRSA infection nor to guide or monitor treatment for MRSA infections.      Time coordinating discharge: Over 30 minutes  SIGNED:   Calvert Cantor, MD  [facility] 11/09/2016, 12:20 PM Pager   If 7PM-7AM, please contact night-coverage [email or web address] Password [email or web address]

## 2016-11-10 MED ORDER — MIDAZOLAM HCL 2 MG/2ML IJ SOLN
1.0000 mg | INTRAMUSCULAR | Status: DC
  Administered 2016-11-10 – 2016-11-13 (×15): 1 mg via INTRAVENOUS
  Filled 2016-11-10 (×15): qty 2

## 2016-11-10 NOTE — Progress Notes (Addendum)
CSW assisting with d/c planning. CS[facility] has [facility] to see if [facility] will be an opening today. CSW will update family / nsg / MD once liaison contacts CSW with a decision.   Cori RazorJamie Lakendra Helling LCSW [phone]  11:51 CSW has contacted [facility] liaison for update on availability. Liaison will contact CSW once more info is available.  Cori RazorJamie Farah Benish LCSW [phone]

## 2016-11-10 NOTE — Progress Notes (Signed)
Daily Progress Note   Patient Name: Carrie Pena       Date: 11/10/2016 DOB: 02-Feb-1928  Age: 83 y.o. MRN#: [id number] Attending Physician: Debbe Odea, MD Primary Care Physician: Thressa Sheller, MD Admit Date: 11/02/2016  Reason for Consultation/Follow-up: Non pain symptom management, Psychosocial/spiritual support and Terminal Care  Subjective: Carrie Pena was transitioned to full comfort care 1/23. Her vitals remain stable and she is safe for transport to a residential hospice facility if a bed becomes available. Ongoing terminal delirium, which appears to be worse in the mornings and late afternoon.   Length of Stay: 8  Current Medications: Scheduled Meds:  . bisacodyl  10 mg Rectal Once  . lidocaine  1 patch Transdermal Q24H  . mouth rinse  15 mL Mouth Rinse q12n4p  . methylPREDNISolone (SOLU-MEDROL) injection  40 mg Intravenous Q6H  . midazolam  1 mg Intravenous Q4H  . pantoprazole  40 mg Intravenous Daily    Continuous Infusions: . HYDROmorphone 0.5 mg/hr (11/08/16 2027)    PRN Meds: antiseptic oral rinse, glycopyrrolate, HYDROmorphone, ipratropium-albuterol, midazolam, ondansetron (ZOFRAN) IV, polyvinyl alcohol  Physical Exam    Constitutional: She has a sickly appearance. Frail woman awake, but non-interactive.  Eyes:  Eyes predominantly closed, when open she has a fixed stare  Neck: Normal range of motion.  Cardiovascular: Regular rate.  Pulmonary/Chest:  Periods of acute tachypnea with marked increased work of breathing, then reverts to normal rate with mild increased work of breathing. No wheeze. Abdominal: Soft.  Musculoskeletal: She exhibits edema(generalized) Neurological:  Opens eyes to physical stimuli, but fixed stare. Does not follow any commands. Non-intearctive. Skin: Skin is warm and dry. There  is pallor.  Significant bruising and edema. Psychiatric:  Short bursts of acute anxiety with calling out and grimacing.         Vital Signs: BP 130/77   Pulse 81   Temp 97.6 F (36.4 C) (Axillary)   Resp (!) 9   Ht 5' 2" (1.575 m)   Wt 71.6 kg (157 lb 13.6 oz)   SpO2 95%   BMI 28.87 kg/m  SpO2: SpO2: 95 % O2 Device: O2 Device: Not Delivered O2 Flow Rate: O2 Flow Rate (L/min): 2 L/min  Intake/output summary:   Intake/Output Summary (Last 24 hours) at 11/10/16 0758 Last data filed at 11/10/16 0600  Gross per 24 hour  Intake              174 ml  Output              305 ml  Net             -131 ml   LBM: Last BM Date: 11/09/16 Baseline Weight: Weight: 66.2 kg (146 lb) Most recent weight: Weight: 71.6 kg (157 lb 13.6 oz)       Palliative Assessment/Data: PPS 10%   Flowsheet Rows   Flowsheet Row Most Recent Value  Intake Tab  Referral Department  Hospitalist  Unit at Time of Referral  ICU  Palliative Care Primary Diagnosis  Pulmonary  Date Notified  11/06/16  Palliative Care Type  New Palliative care  Reason for referral  Clarify Goals of Care  Date of Admission  11/02/16  #  of days IP prior to Palliative referral  4  Clinical Assessment  Psychosocial & Spiritual Assessment  Palliative Care Outcomes      Patient Active Problem List   Diagnosis Date Noted  . Delirium   . Terminal care   . Goals of care, counseling/discussion   . Palliative care by specialist   . Influenza A 11/02/2016  . Hypokalemia 11/02/2016  . Hypotension 11/02/2016  . Influenza 11/02/2016  . Severe sepsis (HCC) 11/02/2016  . Renal insufficiency 10/19/2016  . PAD (peripheral artery disease) (HCC) 10/19/2016  . AAA (abdominal aortic aneurysm) without rupture (HCC) 10/03/2016  . Anxiety state 10/03/2016  . History of pulmonary embolism 10/03/2016  . Hyperglycemia 10/03/2016  . Anemia associated with acute blood loss   . Chronic obstructive pulmonary disease (HCC) 09/29/2016  .  Chronic diastolic CHF (congestive heart failure) (HCC) 09/06/2016  . AKI (acute kidney injury) (HCC) 09/06/2016  . Iliopsoas muscle hematoma, left, initial encounter 09/01/2016  . Intractable pain 09/01/2016  . Macrocytic anemia 09/01/2016  . Essential hypertension, benign 08/22/2015  . COPD with acute exacerbation (HCC) 08/12/2015  . Acute on chronic respiratory failure with hypoxia (HCC) 08/12/2015  . CAP (community acquired pneumonia) 08/12/2015  . Pain of right lower extremity 05/06/2014  . Swelling of limb 05/06/2014  . Atherosclerosis of native arteries of the extremities with intermittent claudication 07/18/2011    Palliative Care Assessment & Plan   HPI: 83 y.o. female  with past medical history of COPD with chronic hypoxic respiratory failure, dCHF, HTN, PAD, PE on Xarelto who presented to the ED from her nursing facility with fever, hypotension, and respiratory distress who was admitted on 11/02/2016 with work-up revealing influenza. She is on 3L O2 at baseline, and required escalation to BiPAP on admission. There was some improvement, however on 1/20 she acutely worsened and was transferred to stepdown. Suspect flu with triggered COPD exacerbation. Unfortunately, she continues to decline despite interventions. She is increasingly confused, continues to have respiratory distress requiring HFNC, and now has progressive AKI (suspected due to prerenal azotemia in the setting of infection and hypotension). Palliative was consulted to facilitate goals of care conversations with her family in the setting of continued decline despite aggressive interventions.  Assessment: When I met with the patient's daughter and granddaughter 1/22 they continued to express a hopefulness for recovery and an ongoing commitment to pursue aggressive interventions and re-assess on a daily basis. 1/23, I saw more concerning signs that Carrie Pena is deteriorating: rising creatinine, volume overload, ongoing  unresponsive, and increasing medication use for periods of acute agitation/anxiety. I met with her family outside of the room and expressed my concerns with them, specifically bringing up that I am worried she may be dying. After discussion and prayer, they decided to transition to full comfort care. We discussed plan to place her on a dilaudid drip to help manage her dyspnea and pain, and have IV medication available for symptoms, such as anxiety, agitation, secretions, and nausea. We will also removed her NG tube. Given her significant terminal delirium, she was also started on scheduled and PRN versed, which does seem to help.   Today, Carrie Pena reamins stable enough to consider transitioning to residential hospice. Her family remains hopeful for a bed available at [facility], as they are concerned about the cost of gas travelling to [place]. Her family continues to express the desire to take her home to die, however I remain concerned that she will need escalating symptom management and   would not do well there. That said, I did discuss the option of transitioning to residential hospice, and if she stabilized on an oral regimen, they could consider transitioning her home from there.   Recommendations/Plan:  DNR, full comfort care  No abx, labs, or further imaging  Family wants to leave on bedside telemetry monitor; which I am fine with  SW working on residential hospice placement  Symptoms  Continue dilaudid drip with PRN boluses for pain and dyspnea  Continue sch and PRN Versed for terminal delirium   May need to change to q3H sch, however I will monitor today and encourage bolus use  Goals of Care and Additional Recommendations:  Limitations on Scope of Treatment: Full Comfort Care  Code Status:  DNR  Prognosis:   < 2 weeks  Discharge Planning:  Hospice facility  Care plan was discussed with care nurse and pt's daughter   Thank you for allowing the Palliative  Medicine Team to assist in the care of this patient.  Total time: 65 minutes  Time: 0730-0820; 0940-0955     Greater than 50%  of this time was spent counseling and coordinating care related to the above assessment and plan.   , NP Palliative Medicine Team [phone] pager (7a-5p) Team Phone # [phone]  

## 2016-11-10 NOTE — Progress Notes (Signed)
[facility]  83 y.o.femalewith medical history significant for COPD with chronic hypoxic respiratory failure, hypertension, peripheral arterial disease, and history of pulmonary embolism on Xarelto, now presenting from her nursing facility for evaluation of fevers, respiratory distress, and hypotension. Workup positive for influenza.  Initially improved with BiPAP but then developed recurrent respiratory distress, was transferred to step down and again started on BiPAP. She developed worsening encephalopathy, declining renal function, had coffee-ground emesis with a significant drop in hemoglobin. She was transfused 2 units PRBC 1/23. Palliative care consulted and patient transitioned to full comfort measure after discussion with daughter and granddaughter on 1/23  I have examined the patient today. She appears comfortable. Awaiting placement in hospice home. D/C summary completed on 1/24.   Calvert CantorSaima Yates Weisgerber, MD Pager: [email or web address]

## 2016-11-10 NOTE — Progress Notes (Addendum)
[facility]:  Received a referral from BelmondJamie, CSW for family [facility]  Attempted to call granddaughter, [name] with no answer.  [facility] currently reviewing information to confirm medical eligibility and bed availability.  Will update Asher MuirJamie, CSW as soon as more information becomes available and/or a bed becomes available.  Please call with any questions.  Thank you, Hessie KnowsStacie Wilkinson RN, [facility] Liaison [phone]  Addendum @ 4:15:  [facility]  [facility] to confirm interest and answer questions. Per [name]'s request, plan is to complete paperwork for transfer in AM.  Updated Asher MuirJamie, CSW re: bed availability.

## 2016-11-11 LAB — BASIC METABOLIC PANEL
Anion gap: 10 (ref 5–15)
BUN: 113 mg/dL — AB (ref 6–20)
CALCIUM: 8.7 mg/dL — AB (ref 8.9–10.3)
CO2: 20 mmol/L — AB (ref 22–32)
CREATININE: 2.33 mg/dL — AB (ref 0.44–1.00)
Chloride: 122 mmol/L — ABNORMAL HIGH (ref 101–111)
GFR calc non Af Amer: 18 mL/min — ABNORMAL LOW (ref >60)
GFR, EST AFRICAN AMERICAN: 21 mL/min — AB (ref >60)
Glucose, Bld: 116 mg/dL — ABNORMAL HIGH (ref 65–99)
Potassium: 5.2 mmol/L — ABNORMAL HIGH (ref 3.5–5.1)
Sodium: 152 mmol/L — ABNORMAL HIGH (ref 135–145)

## 2016-11-11 NOTE — Progress Notes (Signed)
[facility]  [facility] Please see my d/c summary form 1/24.   Carrie CantorSaima [name], MD

## 2016-11-11 NOTE — Care Management Important Message (Signed)
Important Message  Patient Details IM Letter given to Suzanne/Case Manager to present to Patient Name: Carrie Pena MRN: [id number] Date of Birth: 11/09/1927   Medicare Important Message Given:  Yes    Caren MacadamFuller, Daina Cara 11/11/2016, 12:26 PMImportant Message  Patient Details  Name: Carrie Pena MRN: [id number] Date of Birth: 11/10/1927   Medicare Important Message Given:  Yes    Caren MacadamFuller, Haralambos Yeatts 11/11/2016, 12:26 PM

## 2016-11-11 NOTE — Progress Notes (Signed)
CSW assisting wit d/c planning. [facility] will have a bed for pt tomorrow. Week end CSW will [facility] [phone].   Cori RazorJamie Elani Delph LCSW [phone]

## 2016-11-11 NOTE — Progress Notes (Signed)
Nutrition Brief Note  Chart reviewed. Pt now transitioning to comfort care.  No further nutrition interventions warranted at this time.  Please re-consult as needed.   Deunta Beneke, RD, LDN Pager #- [phone]    

## 2016-11-11 NOTE — Consult Note (Signed)
[facility]  [facility] room is available this morning for patient to transfer. Plan to meet HCPOA this morning to complete paperwork. Will update CSW when paper work complete.  Will need DC summary faxed to [phone].  Will need  RN to call report to [phone].  Thank you, Forrestine Himva Davis, LCSW [phone]  [facility] liaisons are listed on AMION under [facility]

## 2016-11-11 NOTE — Progress Notes (Signed)
[facility]  Patient examined today. She appears comfortable. Her POA was suddenly reconsidering her decision about comfort care this AM and wanted me to obtain a Bmet which I did. Cr improving but BUN, Sodium, K have increased. She has re-confirmed now that she doee still want comfort care and [facility]. I have been told a bed would be available tomorrow.    Carrie CantorSaima Durelle Zepeda, MD

## 2016-11-11 NOTE — Progress Notes (Signed)
Pt arrived to unit room 1515 via stretcher with grandaughter. VS taken, pt disoriented x4. Initial assessment completed, will monitor throughout shift.

## 2016-11-11 NOTE — Progress Notes (Signed)
CS[facility]. [facility] will [facility].  Cori RazorJamie Javi Bollman LCSW [phone]

## 2016-11-12 LAB — TYPE AND SCREEN
BLOOD PRODUCT EXPIRATION DATE: 201802202359
BLOOD PRODUCT EXPIRATION DATE: 201802222359
BLOOD PRODUCT EXPIRATION DATE: 201802262359
ISSUE DATE / TIME: 201801230736
ISSUE DATE / TIME: 201801231031
UNIT TYPE AND RH: 9500
UNIT TYPE AND RH: 9500
Unit Type and Rh: 9500

## 2016-11-12 NOTE — Progress Notes (Signed)
NPO

## 2016-11-12 NOTE — Clinical Social Work Note (Signed)
CSW talked with [facility] staff person [facility] to [facility] on Sunday, 1/28. Patient's nurse and family (daughter and granddaughter at the bedside) informed.   Genelle BalVanessa Josearmando Kuhnert, MSW, LCSW Licensed Clinical Social Worker Clinical Social Work Department [facility] [phone]

## 2016-11-12 NOTE — Progress Notes (Signed)
[facility]  Still waiting on [facility] bed. Appears comfortable.   Calvert CantorSaima Cashel Bellina, MD

## 2016-11-13 NOTE — Progress Notes (Signed)
Wasted 40ML of dilauded gtt in sink with nurse Rosey Batheresa as witness.

## 2016-11-13 NOTE — Clinical Social Work Note (Signed)
Pt is ready for discharge today [facility] Family is aware and agreeable to discharge plan. [facility] is ready to admit pt as they have received discharge information. RN will call report. [facility] will provide transportation. CSW is signing off as no further needs identified.   Dede QuerySarah Ankita Newcomer, MSW, LCSW  Clinical Social Worker  [phone]

## 2016-11-15 ENCOUNTER — Ambulatory Visit: Payer: Self-pay | Admitting: *Deleted

## 2016-11-17 DEATH — deceased

## 2017-01-05 ENCOUNTER — Encounter (HOSPITAL_COMMUNITY): Payer: Self-pay

## 2017-01-05 ENCOUNTER — Ambulatory Visit: Payer: Self-pay | Admitting: Vascular Surgery

## 2018-04-16 IMAGING — CT CT ABD-PELV W/ CM
2 of 4 series · 16 of 46 positions shown, 18 images · IV contrast (APPLIED)
Comparison: 09/01/2016.

CLINICAL DATA: History of iliopsoas hematoma, recent falls with
left-sided pain, initial encounter

EXAM:
CT ABDOMEN AND PELVIS WITH CONTRAST
TECHNIQUE: Multidetector CT imaging of the abdomen and pelvis was performed
using the standard protocol following bolus administration of
intravenous contrast.
CONTRAST:  75 mL Fsovue-OZZ

[Series 2: axial st · axial · 0.90mm/px · z∈[+864,+1279]mm · 13 of 91 slices shown, 15 images]
[im 4/91  soft-tissue]
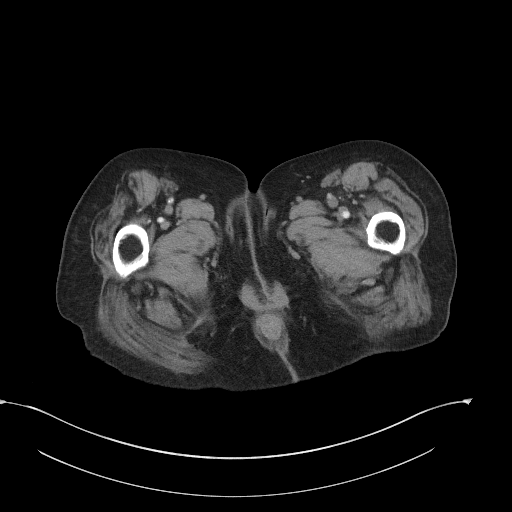
[im 4/91  bone]
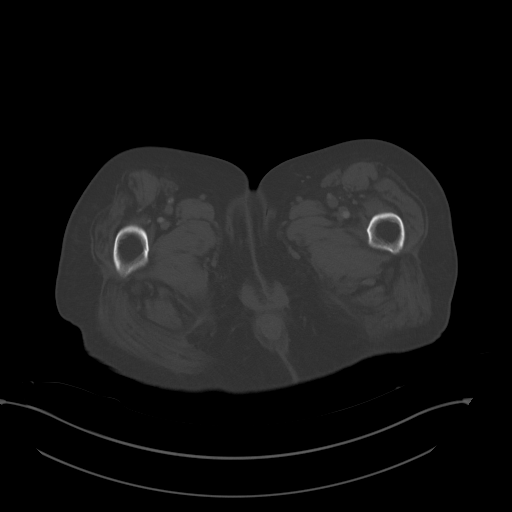
[im 12/91  soft-tissue]
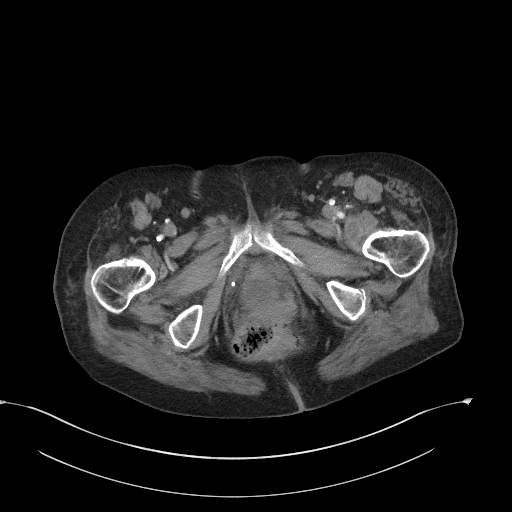
[im 20/91  soft-tissue]
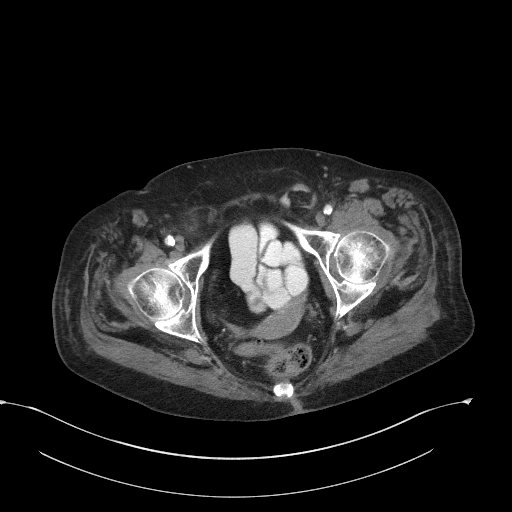
[im 24/91  soft-tissue]
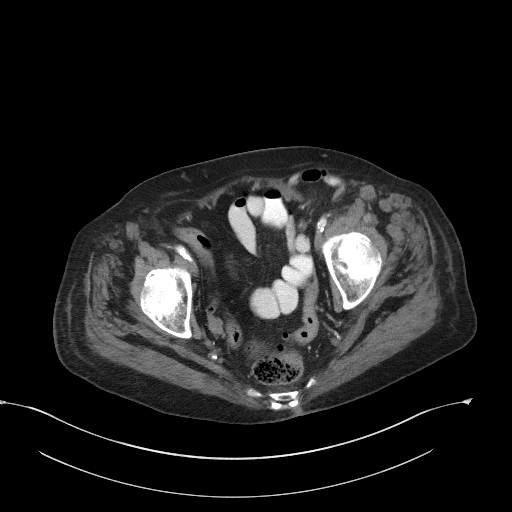
[im 32/91  soft-tissue]
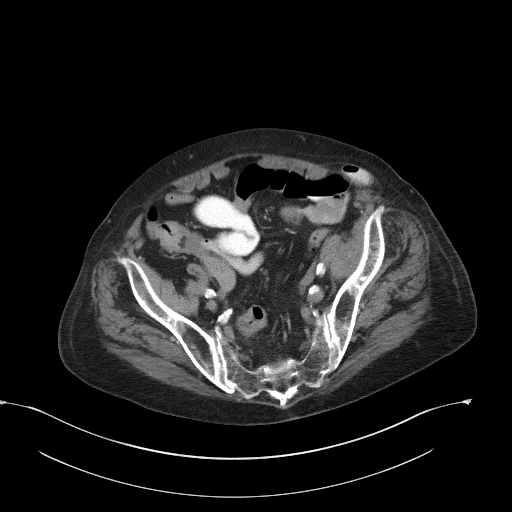
[im 40/91  soft-tissue]
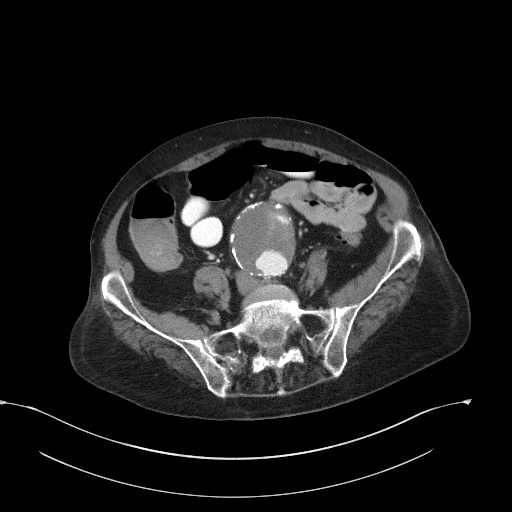
[im 47/91  soft-tissue]
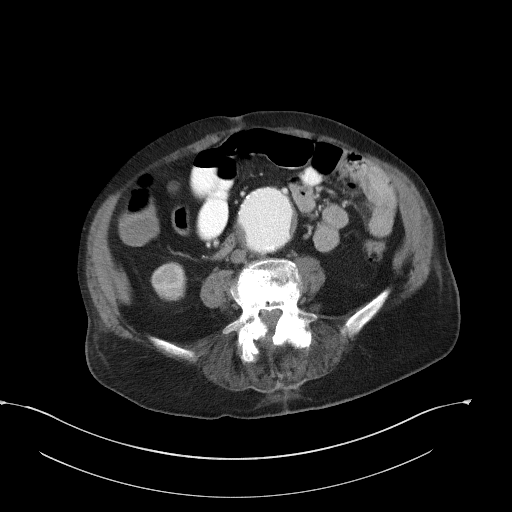
[im 51/91  soft-tissue]
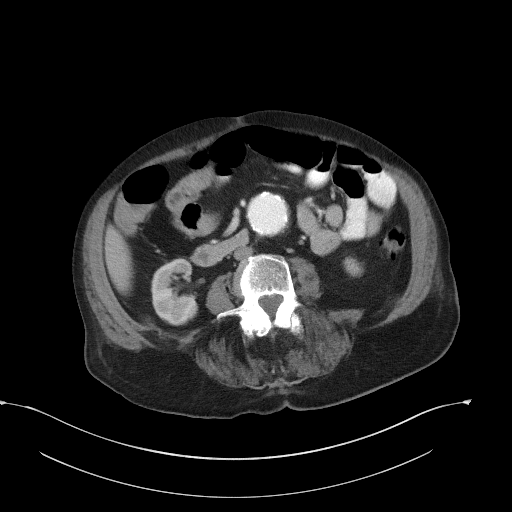
[im 59/91  soft-tissue]
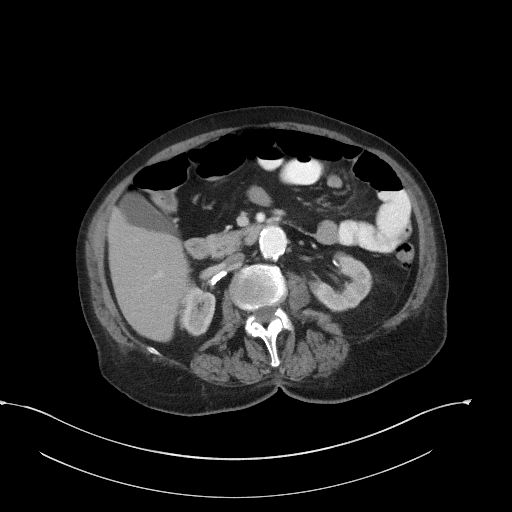
[im 59/91  bone]
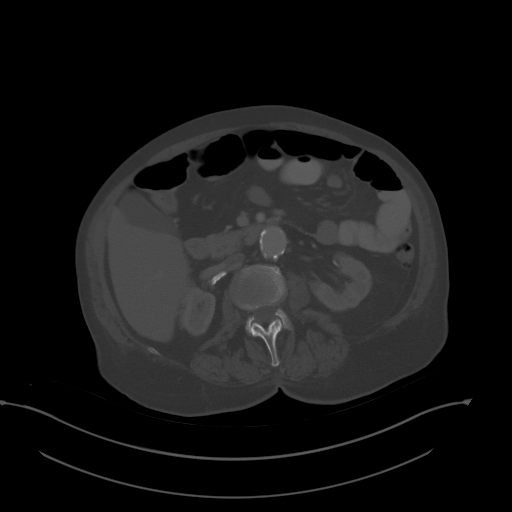
[im 67/91  soft-tissue]
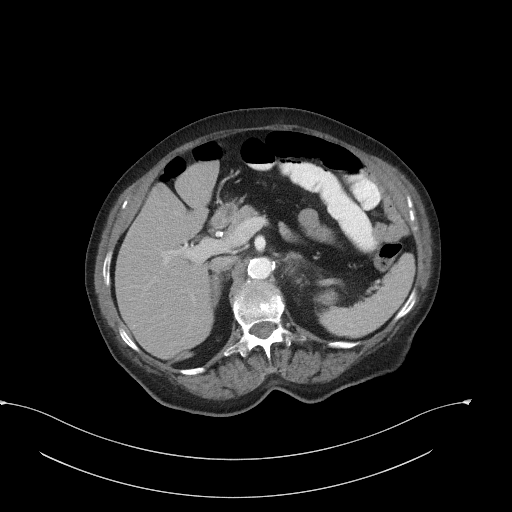
[im 71/91  soft-tissue]
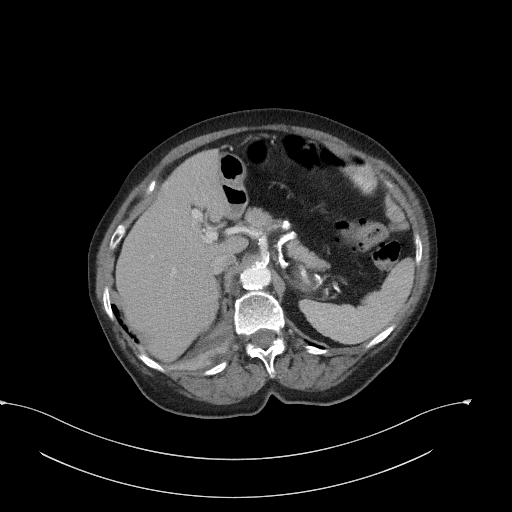
[im 79/91  soft-tissue]
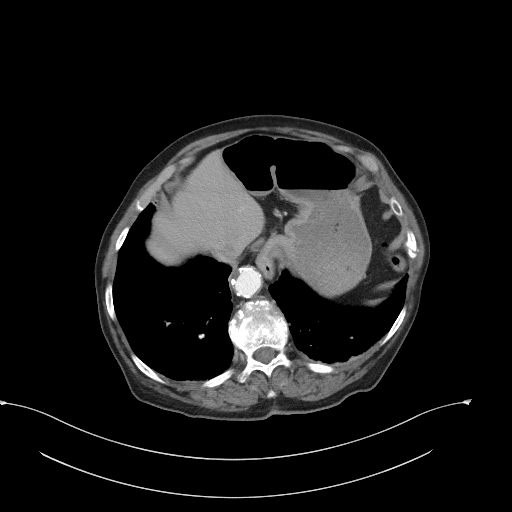
[im 87/91  soft-tissue]
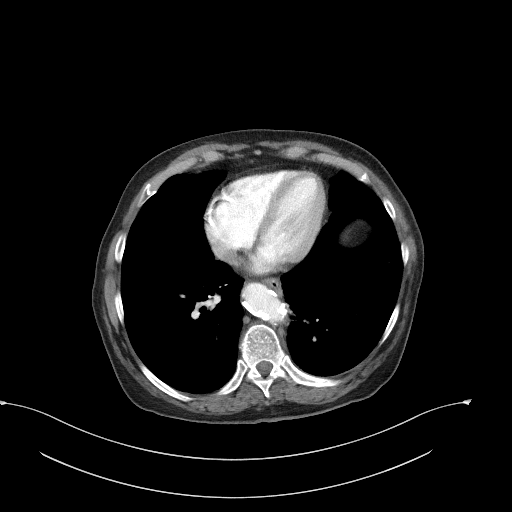

[Series 4: coronal st · coronal · 0.79mm/px · 3 of 101 slices shown]
[im 34/101  soft-tissue]
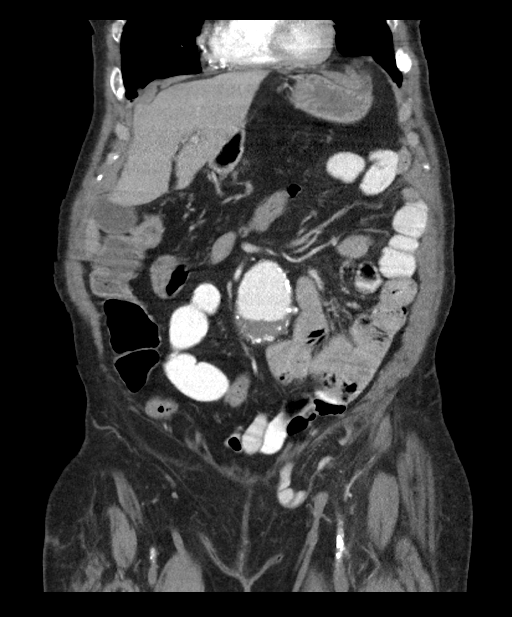
[im 45/101  soft-tissue]
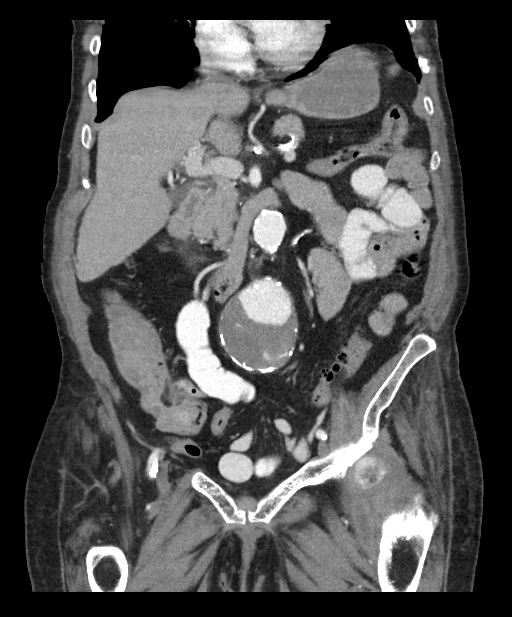
[im 56/101  soft-tissue]
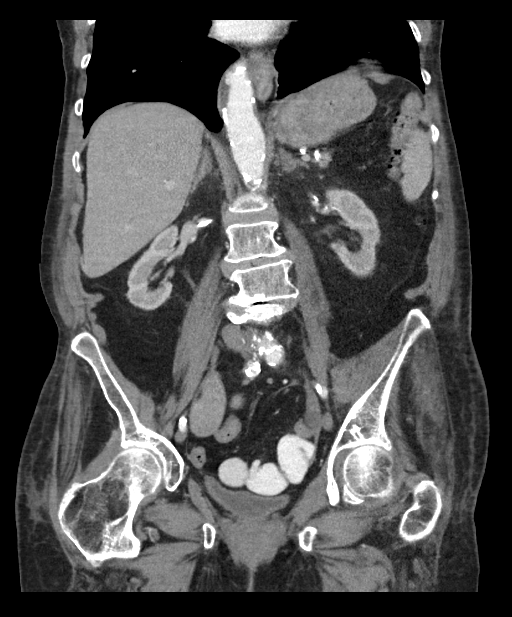

[16 of 46 positions shown; findings below may reference images not displayed]

FINDINGS: Lower chest: Bilateral lower lobe atelectasis. No sizable effusion
is seen. Small hiatal hernia is noted.

Hepatobiliary: No focal liver abnormality is seen. No gallstones,
gallbladder wall thickening, or biliary dilatation.

Pancreas: Unremarkable. No pancreatic ductal dilatation or
surrounding inflammatory changes.

Spleen: Multiple calcified granulomas are noted. The spleen is
otherwise within normal limits.

Adrenals/Urinary Tract: Renal vascular calcifications are noted. No
calculi are seen. No obstructive changes are noted. The adrenal
glands show mild fullness on the left likely related to hyperplasia.

Stomach/Bowel: Diffuse diverticular change of the colon is noted.
The appendix is within normal limits. No inflammatory changes are
noted.

Vascular/Lymphatic: Diffuse aortic calcifications are noted. There
are changes consistent within infrarenal aortic aneurysm. It
measures 6.8 x 6.4 cm in greatest AP and transverse dimensions
respectively. Significant mural thrombus is identified no
extravasation is identified. The aneurysm extends to the aortic
bifurcation. Diffuse calcifications are noted throughout the iliac
vessels without aneurysmal dilatation.

Reproductive: Uterus and bilateral adnexa are unremarkable.

Other: Left inguinal hernia is noted with multiple loops of small
bowel within. No obstructive changes are seen. This is stable from
the prior exam.

Musculoskeletal: Degenerative changes of lumbar spine are seen.
IMPRESSION: Mild bibasilar atelectatic changes without pleural effusion.

Abdominal aortic aneurysm as described. Vascular surgery
consultation recommended due to increased risk of rupture for AAA
>5.5 cm. This recommendation follows ACR consensus guidelines: White
Paper of the ACR Incidental Findings Committee II on Vascular
Findings. [HOSPITAL] 9602; [DATE].

Diverticulosis without diverticulitis.

Left inguinal hernia containing small bowel loops without
incarceration.

## 2018-05-04 IMAGING — CR DG CHEST 1V PORT
1 series · 1 of 1 positions shown · non-contrast
Comparison: 10/10/2016 CXR

CLINICAL DATA: Dyspnea

EXAM:
PORTABLE CHEST 1 VIEW

[AP]
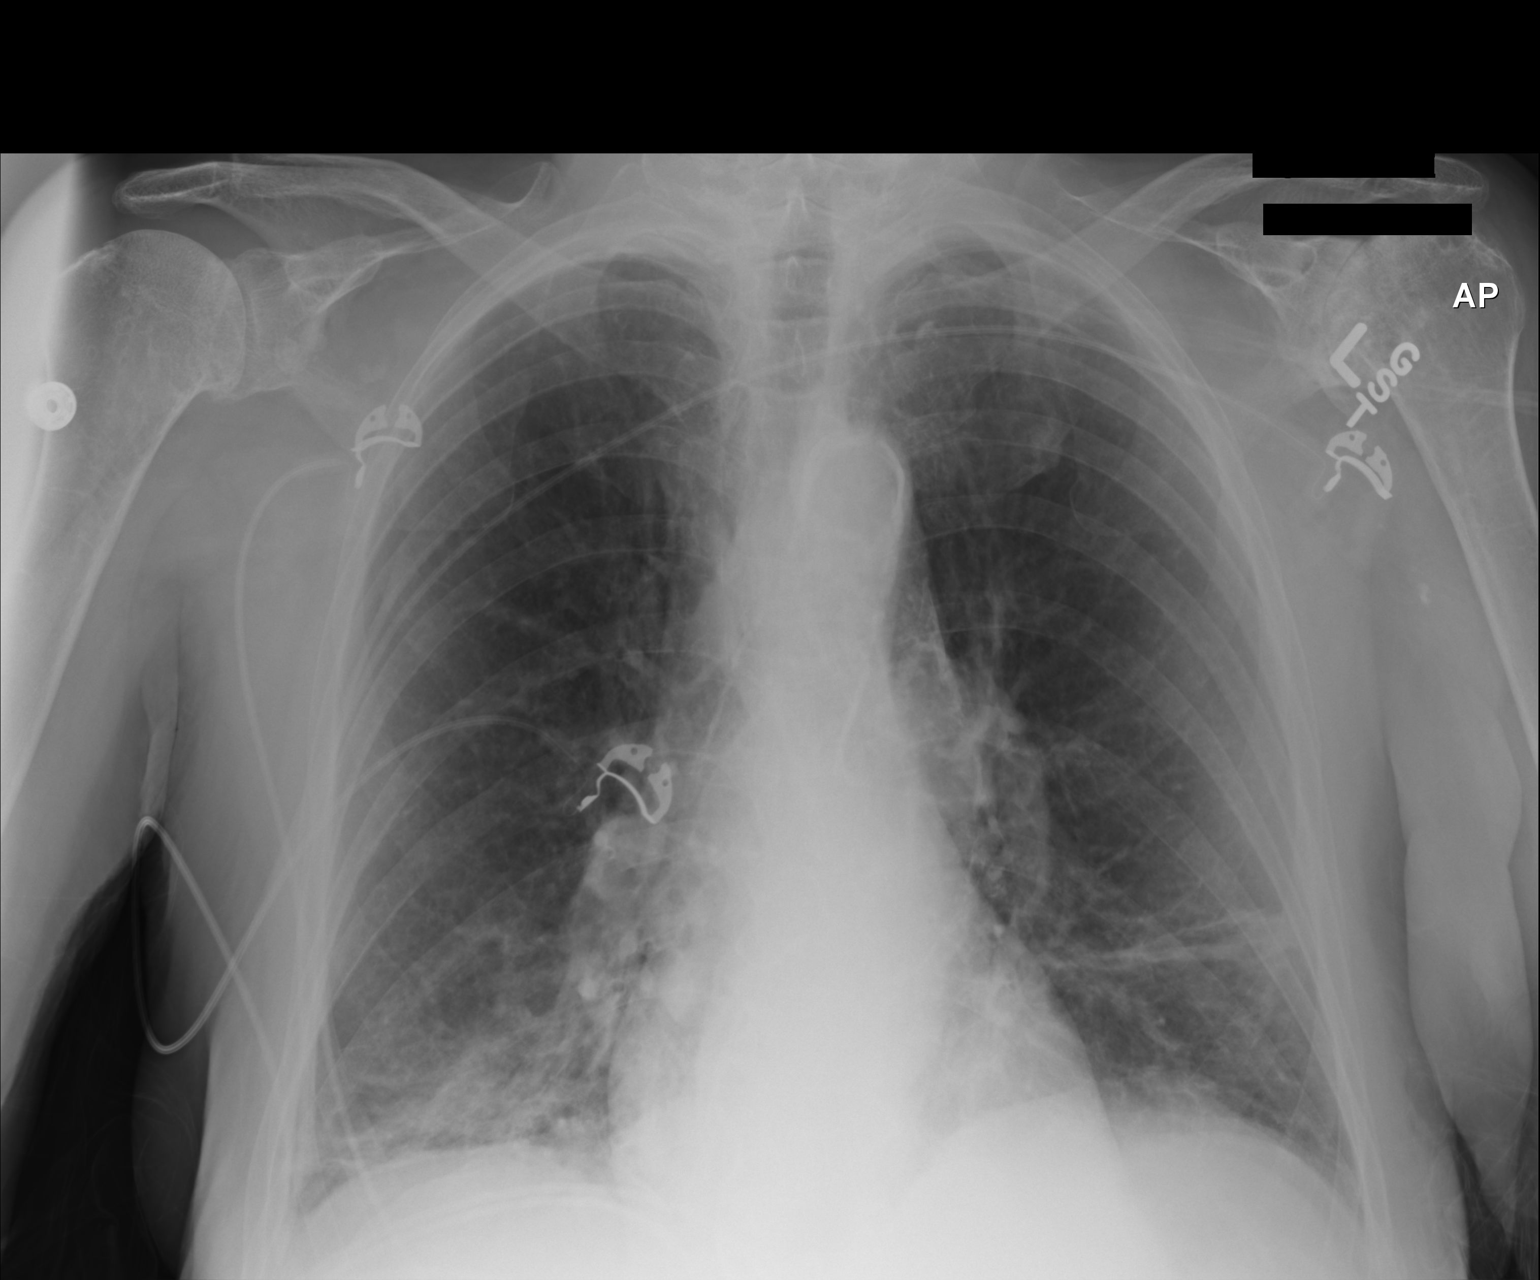

[1 of 1 positions shown; findings below may reference images not displayed]

FINDINGS: The heart size and mediastinal contours are within normal limits.
Aortic atherosclerosis is noted. Patchy airspace disease at the
right lung base suspicious for pneumonia. Subsegmental can platelike
atelectasis at the left lung base. Osteoarthritis of the
glenohumeral joints.
IMPRESSION: Patchy new airspace opacity at the right lung base suspicious for
pneumonia. Left lower lobe atelectasis.

## 2018-05-09 IMAGING — CR DG CHEST 1V PORT
1 series · 1 of 1 positions shown · non-contrast
Comparison: 10/20/2016

CLINICAL DATA: Shortness of Breath

EXAM:
PORTABLE CHEST 1 VIEW

[AP]
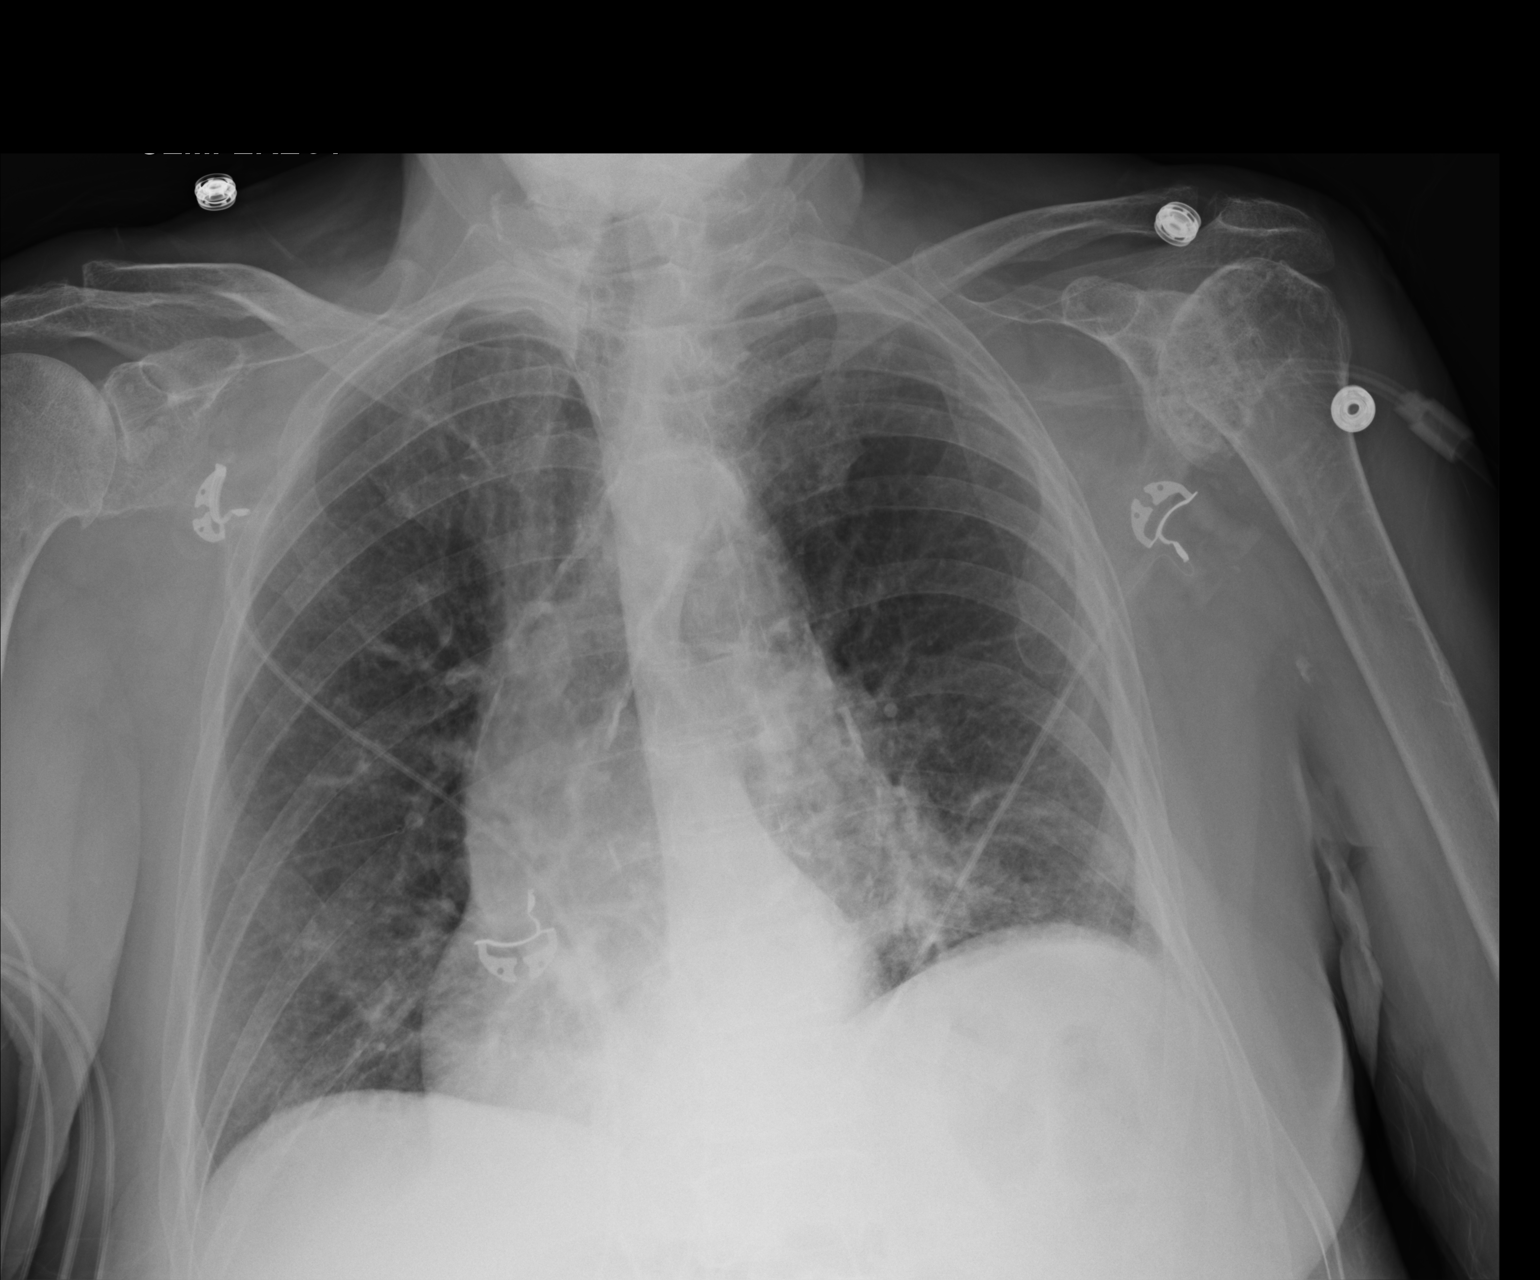

[1 of 1 positions shown; findings below may reference images not displayed]

FINDINGS: Cardiac shadow is stable. Aortic calcifications are again seen.
Lungs are well aerated bilaterally. Interval clearing in the right
base is noted. Some left basilar atelectasis is now seen however.
Chronic degenerative change of the left shoulder joint is noted. No
other focal abnormality is seen.
IMPRESSION: Interval clearing of right basilar infiltrate. Left basilar
atelectasis is now seen however.
